# Patient Record
Sex: Male | Born: 1965
Health system: Southern US, Community
[De-identification: ages and names within clinical notes are randomized; demographics above are authoritative.]

## PROBLEM LIST (undated history)

## (undated) DIAGNOSIS — E7800 Pure hypercholesterolemia, unspecified: Secondary | ICD-10-CM

## (undated) DIAGNOSIS — E119 Type 2 diabetes mellitus without complications: Secondary | ICD-10-CM

## (undated) DIAGNOSIS — I10 Essential (primary) hypertension: Secondary | ICD-10-CM

## (undated) DIAGNOSIS — R609 Edema, unspecified: Secondary | ICD-10-CM

## (undated) DIAGNOSIS — N189 Chronic kidney disease, unspecified: Secondary | ICD-10-CM

## (undated) DIAGNOSIS — L97509 Non-pressure chronic ulcer of other part of unspecified foot with unspecified severity: Secondary | ICD-10-CM

## (undated) DIAGNOSIS — G629 Polyneuropathy, unspecified: Secondary | ICD-10-CM

## (undated) DIAGNOSIS — M1990 Unspecified osteoarthritis, unspecified site: Secondary | ICD-10-CM

## (undated) DIAGNOSIS — E11621 Type 2 diabetes mellitus with foot ulcer: Secondary | ICD-10-CM

## (undated) HISTORY — PX: HIP SURGERY: SHX245

---

## 2014-10-23 ENCOUNTER — Emergency Department (HOSPITAL_COMMUNITY)
Admission: EM | Admit: 2014-10-23 | Discharge: 2014-10-23 | Disposition: A | Discharge status: Home / Self Care | Payer: Self-pay | Attending: Emergency Medicine | Admitting: Emergency Medicine

## 2014-10-23 ENCOUNTER — Encounter (HOSPITAL_COMMUNITY): Payer: Self-pay | Admitting: Neurology

## 2014-10-23 DIAGNOSIS — G9009 Other idiopathic peripheral autonomic neuropathy: Secondary | ICD-10-CM | POA: Insufficient documentation

## 2014-10-23 DIAGNOSIS — Z87891 Personal history of nicotine dependence: Secondary | ICD-10-CM | POA: Insufficient documentation

## 2014-10-23 DIAGNOSIS — E1165 Type 2 diabetes mellitus with hyperglycemia: Secondary | ICD-10-CM | POA: Insufficient documentation

## 2014-10-23 DIAGNOSIS — G629 Polyneuropathy, unspecified: Secondary | ICD-10-CM

## 2014-10-23 DIAGNOSIS — R000 Tachycardia, unspecified: Secondary | ICD-10-CM | POA: Insufficient documentation

## 2014-10-23 HISTORY — DX: Polyneuropathy, unspecified: G62.9

## 2014-10-23 HISTORY — DX: Type 2 diabetes mellitus without complications: E11.9

## 2014-10-23 LAB — COMPREHENSIVE METABOLIC PANEL
ALK PHOS: 101 U/L (ref 39–117)
ALT: 26 U/L (ref 0–53)
AST: 20 U/L (ref 0–37)
Albumin: 3.7 g/dL (ref 3.5–5.2)
Anion gap: 10 (ref 5–15)
BUN: 21 mg/dL (ref 6–23)
CO2: 25 mmol/L (ref 19–32)
Calcium: 9.5 mg/dL (ref 8.4–10.5)
Chloride: 96 mmol/L (ref 96–112)
Creatinine, Ser: 0.96 mg/dL (ref 0.50–1.35)
GFR calc Af Amer: >90 mL/min (ref >90)
GFR calc non Af Amer: >90 mL/min (ref >90)
GLUCOSE: 540 mg/dL — AB (ref 70–99)
Potassium: 4.5 mmol/L (ref 3.5–5.1)
Sodium: 131 mmol/L — ABNORMAL LOW (ref 135–145)
Total Bilirubin: 0.8 mg/dL (ref 0.3–1.2)
Total Protein: 6.8 g/dL (ref 6.0–8.3)

## 2014-10-23 LAB — CBC
HEMATOCRIT: 44.2 % (ref 39.0–52.0)
HEMOGLOBIN: 15.6 g/dL (ref 13.0–17.0)
MCH: 28.8 pg (ref 26.0–34.0)
MCHC: 35.3 g/dL (ref 30.0–36.0)
MCV: 81.5 fL (ref 78.0–100.0)
Platelets: 292 10*3/uL (ref 150–400)
RBC: 5.42 MIL/uL (ref 4.22–5.81)
RDW: 12.1 % (ref 11.5–15.5)
WBC: 7.4 10*3/uL (ref 4.0–10.5)

## 2014-10-23 LAB — URINALYSIS, ROUTINE W REFLEX MICROSCOPIC
BILIRUBIN URINE: NEGATIVE
Glucose, UA: >1000 mg/dL — AB
Hgb urine dipstick: NEGATIVE
KETONES UR: NEGATIVE mg/dL
LEUKOCYTES UA: NEGATIVE
Nitrite: NEGATIVE
PROTEIN: NEGATIVE mg/dL
Specific Gravity, Urine: 1.034 — ABNORMAL HIGH (ref 1.005–1.030)
Urobilinogen, UA: 0.2 mg/dL (ref 0.0–1.0)
pH: 5.5 (ref 5.0–8.0)

## 2014-10-23 LAB — URINE MICROSCOPIC-ADD ON

## 2014-10-23 LAB — CBG MONITORING, ED
GLUCOSE-CAPILLARY: 466 mg/dL — AB (ref 70–99)
Glucose-Capillary: 319 mg/dL — ABNORMAL HIGH (ref 70–99)

## 2014-10-23 MED ORDER — GABAPENTIN 600 MG PO TABS
300.0000 mg | ORAL_TABLET | Freq: Once | ORAL | Status: DC
  Filled 2014-10-23: qty 0.5

## 2014-10-23 MED ORDER — GABAPENTIN 300 MG PO CAPS: 300.0000 mg | ORAL_CAPSULE | Freq: Three times a day (TID) | ORAL | Status: DC

## 2014-10-23 MED ORDER — GABAPENTIN 300 MG PO CAPS
300.0000 mg | ORAL_CAPSULE | Freq: Once | ORAL | Status: AC
  Administered 2014-10-23: 300 mg via ORAL
  Filled 2014-10-23: qty 1

## 2014-10-23 MED ORDER — SODIUM CHLORIDE 0.9 % IV BOLUS (SEPSIS)
1000.0000 mL | Freq: Once | INTRAVENOUS | Status: AC
  Administered 2014-10-23: 1000 mL via INTRAVENOUS

## 2014-10-23 NOTE — Care Management (Signed)
ED CM received consult for assistance with f/u establishing primary care and medication. Reviewed patient's record patient is uninsured without a PCP. [place]  [facility] rendered, patient agreeable to [facility] Offered to schedule appointment for f/u to establish primary care., patient verbalized appreciation for the assistance.appointment scheduled for Wed. 3/23 at 10[facility]30[facility]. Patient informed to bring prescriptions received in the ED, he [facility]. Verbalized understanding,teach back done. Discussed dsipositon plan with Dr. Madilyn Hookees. No furrher ED CM needs identified

## 2014-10-23 NOTE — ED Provider Notes (Signed)
CSN: [id number]     Arrival date & time 10/23/14  1245 History   First MD Initiated Contact with Patient 10/23/14 1522     Chief Complaint  Patient Shaw with  . Hyperglycemia  . Peripheral Neuropathy     Patient is a 49 y.o. male presenting with hyperglycemia. The history is provided by the patient. No language interpreter was used.  Hyperglycemia  Ryan Shaw for evaluation of high blood sugar and leg pain. He reports that he was diagnosed in January with diabetes after he was complaining of pain in bilateral legs. He was started on metformin 500 mg twice a day by the emergency department. He just moved to the [place] area does not have a family doctor. He has not been checking his blood sugar recently but today he checked it and it read critical high. He comes in for evaluation because his legs continue to hurt and the hydrocodone he was written for does not seem to change anything and that his sugars run high. He reports polydipsia and polyuria. He denies any reported fevers or cough. He denies any chest pain, abdominal pain, vomiting, diarrhea. Symptoms are moderate, constant, worsening.  Past Medical History  Diagnosis Date  . Diabetes mellitus without complication   . Neuropathy    History reviewed. No pertinent past surgical history. No family history on file. History  Substance Use Topics  . Smoking status: Former Games developermoker  . Smokeless tobacco: Not on file  . Alcohol Use: Yes    Review of Systems  All other systems reviewed and are negative.     Allergies  Review of patient's allergies indicates no known allergies.  Home Medications   Prior to Admission medications   Not on File   BP 148/83 mmHg  Pulse 111  Temp(Src) 98.2 F (36.8 C) (Oral)  Resp 18  SpO2 98% Physical Exam  Constitutional: He is oriented to person, place, and time. He appears well-developed and well-nourished.  HENT:  Head: Normocephalic and atraumatic.  Cardiovascular:  Regular rhythm.   No murmur heard. Tachycardic   Pulmonary/Chest: Effort normal and breath sounds normal. No respiratory distress.  Abdominal: Soft. There is no tenderness. There is no rebound and no guarding.  Musculoskeletal: He exhibits no edema or tenderness.  Neurological: He is alert and oriented to person, place, and time.  Skin: Skin is warm and dry.  Psychiatric: He has a normal mood and affect. His behavior is normal.  Nursing note and vitals reviewed.   ED Course  Procedures (including critical care time) Labs Review Labs Reviewed  COMPREHENSIVE METABOLIC PANEL - Abnormal; Notable for the following:    Sodium 131 (*)    Glucose, Bld 540 (*)    All other components within normal limits  URINALYSIS, ROUTINE W REFLEX MICROSCOPIC - Abnormal; Notable for the following:    Specific Gravity, Urine 1.034 (*)    Glucose, UA >1000 (*)    All other components within normal limits  CBG MONITORING, ED - Abnormal; Notable for the following:    Glucose-Capillary 466 (*)    All other components within normal limits  CBG MONITORING, ED - Abnormal; Notable for the following:    Glucose-Capillary 319 (*)    All other components within normal limits  CBC  URINE MICROSCOPIC-ADD ON  HEMOGLOBIN A1C    Imaging Review No results found.   EKG Interpretation None      MDM   Final diagnoses:  Hyperglycemia due to type 2 diabetes mellitus  Peripheral neuropathy    Patient here for evaluation of hyperglycemia and leg pain. Patient is nontoxic and well-hydrated on exam. Labs are consistent with DKA or major electrolyte abnormality. Discussed with patient home care for hyperglycemia. Will increase metformin to 1500 mg daily answering gabapentin for neuropathy. Discussed with patient should follow-up. He has been seen by case management and has follow-up with [facility]. Discussed home care and return precautions.    Tilden Fossa, MD 10/23/14 [phone]

## 2014-10-23 NOTE — ED Notes (Signed)
Pt reports neuropathy and hyperglycemia, his sugars wouldn't register on his machine. Takes metformin. Also left great toe nail fell off and he wants it checked. Just recently moved to the area and doesn't have MD yet. Pt is a x 4

## 2014-10-23 NOTE — ED Notes (Signed)
Called lab to add on Hgb A1C 

## 2014-10-23 NOTE — ED Notes (Signed)
Pt comfortable with discharge and follow up instructions. Prescriptions x1. PT's daughter taking patient home and pt to [facility] 1030am.  Pt aware and in agreement to go to appointment.

## 2014-10-23 NOTE — ED Notes (Signed)
Pt given urinal and aware of need for sample

## 2014-10-23 NOTE — ED Notes (Signed)
Ryan Shaw at bedside.

## 2014-10-23 NOTE — Discharge Instructions (Signed)
Increase your metformin dose to  in the morning (two tablets) and  in the evening (one tablet) until seen by [facility].     Blood Glucose Monitoring Monitoring your blood glucose (also know as blood sugar) helps you to manage your diabetes. It also helps you and your health care provider monitor your diabetes and determine how well your treatment plan is working. WHY SHOULD YOU MONITOR YOUR BLOOD GLUCOSE?  It can help you understand how food, exercise, and medicine affect your blood glucose.  It allows you to know what your blood glucose is at any given moment. You can quickly tell if you are having low blood glucose (hypoglycemia) or high blood glucose (hyperglycemia).  It can help you and your health care provider know how to adjust your medicines.  It can help you understand how to manage an illness or adjust medicine for exercise. WHEN SHOULD YOU TEST? Your health care provider will help you decide how often you should check your blood glucose. This may depend on the type of diabetes you have, your diabetes control, or the types of medicines you are taking. Be sure to write down all of your blood glucose readings so that this information can be reviewed with your health care provider. See below for examples of testing times that your health care provider may suggest. Type 1 Diabetes  Test 4 times a day if you are in good control, using an insulin pump, or perform multiple daily injections.  If your diabetes is not well controlled or if you are sick, you may need to monitor more often.  It is a good idea to also monitor:  Before and after exercise.  Between meals and 2 hours after a meal.  Occasionally between 2:00 a.m. and 3:00 a.m. Type 2 Diabetes  It can vary with each person, but generally, if you are on insulin, test 4 times a day.  If you take medicines by mouth (orally), test 2 times a day.  If you are on a controlled diet, test once a day.  If  your diabetes is not well controlled or if you are sick, you may need to monitor more often. HOW TO MONITOR YOUR BLOOD GLUCOSE Supplies Needed  Blood glucose meter.  Test strips for your meter. Each meter has its own strips. You must use the strips that go with your own meter.  A pricking needle (lancet).  A device that holds the lancet (lancing device).  A journal or log book to write down your results. Procedure  Wash your hands with soap and water. Alcohol is not preferred.  Prick the side of your finger (not the tip) with the lancet.  Gently milk the finger until a small drop of blood appears.  Follow the instructions that come with your meter for inserting the test strip, applying blood to the strip, and using your blood glucose meter. Other Areas to Get Blood for Testing Some meters allow you to use other areas of your body (other than your finger) to test your blood. These areas are called alternative sites. The most common alternative sites are:  The forearm.  The thigh.  The back area of the lower leg.  The palm of the hand. The blood flow in these areas is slower. Therefore, the blood glucose values you get may be delayed, and the numbers are different from what you would get from your fingers. Do not use alternative sites if you think you are having hypoglycemia. Your  reading will not be accurate. Always use a finger if you are having hypoglycemia. Also, if you cannot feel your lows (hypoglycemia unawareness), always use your fingers for your blood glucose checks. ADDITIONAL TIPS FOR GLUCOSE MONITORING  Do not reuse lancets.  Always carry your supplies with you.  All blood glucose meters have a 24-hour "hotline" number to call if you have questions or need help.  Adjust (calibrate) your blood glucose meter with a control solution after finishing a few boxes of strips. BLOOD GLUCOSE RECORD KEEPING It is a good idea to keep a daily record or log of your blood  glucose readings. Most glucose meters, if not all, keep your glucose records stored in the meter. Some meters come with the ability to download your records to your home computer. Keeping a record of your blood glucose readings is especially helpful if you are wanting to look for patterns. Make notes to go along with the blood glucose readings because you might forget what happened at that exact time. Keeping good records helps you and your health care provider to work together to achieve good diabetes management.  Document Released: 07/23/2003 Document Revised: 12/04/2013 Document Reviewed: 12/12/2012 [facility] Patient Information 2015 [place], [place]. This information is not intended to replace advice given to you by your health care provider. Make sure you discuss any questions you have with your health care provider.  Diabetes Mellitus and Food It is important for you to manage your blood sugar (glucose) level. Your blood glucose level can be greatly affected by what you eat. Eating healthier foods in the appropriate amounts throughout the day at about the same time each day will help you control your blood glucose level. It can also help slow or prevent worsening of your diabetes mellitus. Healthy eating may even help you improve the level of your blood pressure and reach or maintain a healthy weight.  HOW CAN FOOD AFFECT ME? Carbohydrates Carbohydrates affect your blood glucose level more than any other type of food. Your dietitian will help you determine how many carbohydrates to eat at each meal and teach you how to count carbohydrates. Counting carbohydrates is important to keep your blood glucose at a healthy level, especially if you are using insulin or taking certain medicines for diabetes mellitus. Alcohol Alcohol can cause sudden decreases in blood glucose (hypoglycemia), especially if you use insulin or take certain medicines for diabetes mellitus. Hypoglycemia can be a life-threatening  condition. Symptoms of hypoglycemia (sleepiness, dizziness, and disorientation) are similar to symptoms of having too much alcohol.  If your health care provider has given you approval to drink alcohol, do so in moderation and use the following guidelines:  Women should not have more than one drink per day, and men should not have more than two drinks per day. One drink is equal to:  12 oz of beer.  5 oz of wine.  1 oz of hard liquor.  Do not drink on an empty stomach.  Keep yourself hydrated. Have water, diet soda, or unsweetened iced tea.  Regular soda, juice, and other mixers might contain a lot of carbohydrates and should be counted. WHAT FOODS ARE NOT RECOMMENDED? As you make food choices, it is important to remember that all foods are not the same. Some foods have fewer nutrients per serving than other foods, even though they might have the same number of calories or carbohydrates. It is difficult to get your body what it needs when you eat foods with fewer nutrients. Examples  of foods that you should avoid that are high in calories and carbohydrates but low in nutrients include:  Trans fats (most processed foods list trans fats on the Nutrition Facts label).  Regular soda.  Juice.  Candy.  Sweets, such as cake, pie, doughnuts, and cookies.  Fried foods. WHAT FOODS CAN I EAT? Have nutrient-rich foods, which will nourish your body and keep you healthy. The food you should eat also will depend on several factors, including:  The calories you need.  The medicines you take.  Your weight.  Your blood glucose level.  Your blood pressure level.  Your cholesterol level. You also should eat a variety of foods, including:  Protein, such as meat, poultry, fish, tofu, nuts, and seeds (lean animal proteins are best).  Fruits.  Vegetables.  Dairy products, such as milk, cheese, and yogurt (low fat is best).  Breads, grains, pasta, cereal, rice, and beans.  Fats such  as olive oil, trans fat-free margarine, canola oil, avocado, and olives. DOES EVERYONE WITH DIABETES MELLITUS HAVE THE SAME MEAL PLAN? Because every person with diabetes mellitus is different, there is not one meal plan that works for everyone. It is very important that you meet with a dietitian who will help you create a meal plan that is just right for you. Document Released: 04/16/2005 Document Revised: 07/25/2013 Document Reviewed: 06/16/2013 [facility] Patient Information 2015 [place], [place]. This information is not intended to replace advice given to you by your health care provider. Make sure you discuss any questions you have with your health care provider.  Diabetic Nephropathy Diabetic nephropathy is a complication of diabetes that leads to damaged kidneys. It develops slowly. The function of healthy kidneys is to filter and clean blood. Kidneys also get rid of body waste products and extra fluid. When the kidney filters are damaged, there is protein loss in the urine, a decline in kidney function, a buildup of kidney waste products and fluid, and high blood pressure. The damage progresses until the kidneys fail.  RISK FACTORS  High blood pressure (hypertension).  High blood sugar (hyperglycemia).  Family history.  Aging.  Obstruction problems affecting the kidneys, the tubes that drain the kidneys (ureters), or the bladder.  Taking certain drugs or medicines. SYMPTOMS  Symptoms may not be seen or felt for many years. You may not notice any signs of kidney failure until your kidneys have lost much of their ability to function. An early sign of damage is when small amounts of protein (albumin) leak into the urine. However, this can only be found through a urine test. Without physical symptoms, a urine test is often not performed. When the kidneys fail, you may feel one or more of the following:  Swelling of the hands and feet from the extra fluid in your body.  Constant upset  stomach.  Constant fatigue. DIAGNOSIS When someone has diabetes, screening tests are done to look for any early signs of problems before symptoms develop and before damage has already been done. These tests may include:   Annual urine tests to screen for trace amounts of protein in the urine (microalbuminuria).  Urine collectionover 24 hours to measure kidney function.  Blood tests that measure kidney function. Your caregiver is aware that problems other than diabetes can damage kidneys. If screening tests show early kidney damage, but it is thought that a different problem is causing the damage, other tests may be performed. Examples of these tests include:  An ultrasound of your kidney.  Taking  a tissue sample (biopsy) from the kidney. TREATMENT The goal of treatment is to prevent or slow down damage to your kidneys. Controlling hypertension and hyperglycemia is critical. Your goal is to maintain a blood pressure below 120/80. If you have certain other medical problems, this goal may be different. Talk to your caregiver to make sure that your blood pressure goal is right for your needs. Regular testing of your blood glucose at home is important. Your goal is to have a normal blood glucose (110 or less when fasting) as often as possible.  In addition, maintaining your hemoglobin A1c level at less than 7% reduces your risk for complications, including kidney damage. Common treatments include:  Dieting by controlling what you eat as well as the portion sizes.  Exercising to control blood pressure and blood glucose.  Taking medicines.  Giving yourself insulin injections if your caregiver feels that it is necessary.  Getting early treatment for urinary tract infections.  Regularly following up with your caregiver. If your disease progresses to end-stage kidney failure, you will need dialysis or a transplant. Dialysis can be done in 1 of 2 ways:  Hemodialysis. Your blood flows from a  tube in your arm through a machine. The machine filters waste and extra fluid. The clean blood flows back into your arm.  Peritoneal dialysis. Your abdomen is filled with a special fluid. The fluid collects waste products and extra fluid from your blood. The fluid is then drained from your abdomen and discarded. SEEK MEDICAL CARE IF:   You are having problems keeping your blood glucose in the goal range.  You have swelling of the hands or feet.  You have weakness.  You have muscles spasms.  You have a constant upset stomach.  You feel tired all the time and this is not normal for you. SEEK IMMEDIATE MEDICAL CARE IF:  You have unusual dizziness or weakness.  You have excessive sleepiness.  You have a seizure or convulsion.  You have severe, painful muscle spasms.  You have shortness of breath or trouble breathing.  You pass out or have a fainting episode.  You have chest pains. MAKE SURE YOU:  Understand these instructions.  Will watch your condition.  Will get help right away if you are not doing well or get worse. Document Released: 08/09/2007 Document Revised: 03/22/2013 Document Reviewed: 03/11/2011 [facility] Patient Information 2015 [place], [place]. This information is not intended to replace advice given to you by your health care provider. Make sure you discuss any questions you have with your health care provider.

## 2014-10-24 ENCOUNTER — Encounter: Payer: Self-pay | Admitting: Family Medicine

## 2014-10-24 ENCOUNTER — Ambulatory Visit: Payer: Self-pay | Attending: Internal Medicine | Admitting: Family Medicine

## 2014-10-24 VITALS — BP 141/93 | HR 108 | Temp 98.3°F | Resp 16 | Ht 78.0 in | Wt 198.0 lb

## 2014-10-24 DIAGNOSIS — E1342 Other specified diabetes mellitus with diabetic polyneuropathy: Secondary | ICD-10-CM

## 2014-10-24 DIAGNOSIS — G629 Polyneuropathy, unspecified: Secondary | ICD-10-CM

## 2014-10-24 DIAGNOSIS — E1165 Type 2 diabetes mellitus with hyperglycemia: Secondary | ICD-10-CM

## 2014-10-24 DIAGNOSIS — E1142 Type 2 diabetes mellitus with diabetic polyneuropathy: Secondary | ICD-10-CM

## 2014-10-24 LAB — POCT URINALYSIS DIPSTICK
BILIRUBIN UA: NEGATIVE
GLUCOSE UA: 500
Ketones, UA: NEGATIVE
Leukocytes, UA: NEGATIVE
Nitrite, UA: NEGATIVE
Protein, UA: NEGATIVE
RBC UA: NEGATIVE
SPEC GRAV UA: 1.015
UROBILINOGEN UA: 0.2
pH, UA: 5.5

## 2014-10-24 LAB — HEMOGLOBIN A1C
Hgb A1c MFr Bld: 13 % — ABNORMAL HIGH (ref 4.8–5.6)
MEAN PLASMA GLUCOSE: 326 mg/dL

## 2014-10-24 LAB — GLUCOSE, POCT (MANUAL RESULT ENTRY)
POC Glucose: 408 mg/dl — AB (ref 70–99)
POC Glucose: 283 mg/dl — AB (ref 70–99)

## 2014-10-24 MED ORDER — TRUERESULT BLOOD GLUCOSE W/DEVICE KIT: 1.0000 | PACK | Status: DC | PRN

## 2014-10-24 MED ORDER — GLUCOSE BLOOD VI STRP: ORAL_STRIP | Status: DC

## 2014-10-24 MED ORDER — INSULIN ASPART 100 UNIT/ML ~~LOC~~ SOLN
20.0000 [IU] | Freq: Once | SUBCUTANEOUS | Status: AC
  Administered 2014-10-24: 20 [IU] via SUBCUTANEOUS

## 2014-10-24 MED ORDER — TRUEPLUS LANCETS 28G MISC: Status: DC

## 2014-10-24 NOTE — Progress Notes (Signed)
Patient ID: Ryan Shaw, male   DOB: 02/11/1966, 49 y.o.   MRN: [id number] Complaint:  ED follow-up diabetes.   Subjective:  Patient was diagnosed with diabetes in January. He has been on metformin 500 bid and reports taking regularly. He went to ED yesterday with BS over 500.  He was stabalized. His metformin was increased to 500 2 in AM and one in PM.  He was instructed to come here to establish care and receive further treatment for diabetes. His BS this morning was over 400 and is over 400 here.He has a prescription for gabapentin to fill here today.     ROS:  GEN:   Denies fever, chills,                     Admits:  WT loss Skin:   Denies lesions or rashes HENT:   Denies  earache, epistaxis, sore throat, or neck pain, or headaches                LUNGS:  Denies SOB with rest or walking CV:   Denies CP or palpitations ABD:   Denies abdominal pain, nausea,and vomiting GU:   Admits to urinary frequency and increased thirst over last few weeks         EXT:    Denies muscle spasms or swelling; no pain in lower ext, no weakness NEURO:   Denies numbness or tingling, denies seizures  Objective:  Filed Vitals:   10/24/14 1046  BP: 141/93  Pulse: 108  Temp: 98.3 F (36.8 C)  Resp: 16  Height: 6\' 6"  (1.981 m)  Weight: 198 lb (89.812 kg)  SpO2: 99%    Physical Exam:  General:  in no acute distress. HEENT:  no pallor, no icterus, moist oral mucosa, no  lymphadenopathy Heart:   Normal  s1 &s2  Regular rate and rhythm, without M,G,R Lungs:   Clear to auscultation bilaterally. Abdomen:  Soft, nontender, nondistended, positive bowel sounds. Exetremeties:  No pedal edema,pedal pulses normal. Neuro:   Alert, awake, oriented x3, nonfocal.  Pertinent Lab Results:  A1C 13 + yesterday. BS 400+ here today.   Medications: Prior to Admission medications   Medication Sig Start Date End Date Taking? Authorizing Provider  gabapentin (NEURONTIN) 300 MG capsule Take 1 capsule  (300 mg total) by mouth 3 (three) times daily. 10/23/14  Yes Tilden FossaElizabeth Rees, MD  HYDROcodone-acetaminophen (NORCO/VICODIN) 5-325 MG per tablet Take 1 tablet by mouth every 6 (six) hours as needed for moderate pain.   Yes Historical Provider, MD  metFORMIN (GLUCOPHAGE) 500 MG tablet Take 500 mg by mouth 2 (two) times daily with a meal.   Yes Historical Provider, MD    Assessment: 1. Diabetes 2. Peripheral neuropathy .   Plan: 1.  Was given 20 units insulin here with decrease in BS to 280. I have asked him to increase his metformin to 2 500 mg tablets BID. He should make an appoint for Nurse BS check in one week and appointment with PCP for one month.  2.  gabipenten as prescribed.  Do no walk around bare-footed. Inspect feet daily.  Follow up:  The patient was given clear instructions to go to ER or return to medical center if symptoms don't improve, worsen or new problems develop. The patient verbalized understanding.    Henrietta HooverLinda C. Halen Antenucci, FNP,BC 10/24/2014, 11:00 AM

## 2014-10-24 NOTE — Progress Notes (Signed)
Patient here to establish care Has history of DM2 and neuropathy and is c/o bilateral foot and leg pain patient does have Rx for gabapentin he is going to get filled

## 2014-10-24 NOTE — Patient Instructions (Signed)
Increase metformin to 2 500 mg tablets twice a day. Follow-up in one week for a nurse visit to check BS. Follow-up in 2 weeks with primary doctor, who will be assigned.

## 2014-10-25 DIAGNOSIS — E1142 Type 2 diabetes mellitus with diabetic polyneuropathy: Secondary | ICD-10-CM | POA: Insufficient documentation

## 2014-10-26 ENCOUNTER — Telehealth: Payer: Self-pay | Admitting: Family Medicine

## 2014-10-26 ENCOUNTER — Emergency Department (INDEPENDENT_AMBULATORY_CARE_PROVIDER_SITE_OTHER)
Admission: EM | Admit: 2014-10-26 | Discharge: 2014-10-26 | Disposition: A | Discharge status: Home / Self Care | Payer: Self-pay | Source: Home / Self Care | Attending: Family Medicine | Admitting: Family Medicine

## 2014-10-26 ENCOUNTER — Encounter (HOSPITAL_COMMUNITY): Payer: Self-pay | Admitting: *Deleted

## 2014-10-26 DIAGNOSIS — E1140 Type 2 diabetes mellitus with diabetic neuropathy, unspecified: Secondary | ICD-10-CM

## 2014-10-26 NOTE — ED Provider Notes (Signed)
CSN: [id number]     Arrival date & time 10/26/14  1725 History   First MD Initiated Contact with Patient 10/26/14 1823     Chief Complaint  Patient presents with  . Numbness   (Consider location/radiation/quality/duration/timing/severity/associated sxs/prior Treatment) HPI Comments: 49 year old male only recently diagnosed with type 2 diabetes mellitus and uncontrolled has developed peripheral neuropathy several months ago. He was seen in emergency department on 10/24/2014 with a blood sugar in the 400s. His peripheral neuropathy was treated with gabapentin. He presents tonight with complaints of the same neuropathic pain. He states his blood sugar was 330 this morning. He has no other complaints.   Past Medical History  Diagnosis Date  . Diabetes mellitus without complication   . Neuropathy    History reviewed. No pertinent past surgical history. Family History  Problem Relation Age of Onset  . Cancer Mother   . Aneurysm Father     brain   History  Substance Use Topics  . Smoking status: Current Some Day Smoker    Types: Cigarettes    Last Attempt to Quit: 10/02/2014  . Smokeless tobacco: Not on file     Comment: uses a vapor -puff-4-5 x/day  . Alcohol Use: Yes     Comment: occasional    Review of Systems  Constitutional: Positive for activity change. Negative for fatigue.  HENT: Negative.   Respiratory: Negative.   Cardiovascular: Negative.   Gastrointestinal: Negative.   Neurological:       Peripheral neuropathic pain as in history of present illness    Allergies  Review of patient's allergies indicates no known allergies.  Home Medications   Prior to Admission medications   Medication Sig Start Date End Date Taking? Authorizing Provider  Blood Glucose Monitoring Suppl (TRUERESULT BLOOD GLUCOSE) W/DEVICE KIT 1 each by Does not apply route as needed. 10/24/14  Yes Micheline Chapman, NP  gabapentin (NEURONTIN) 300 MG capsule Take 1 capsule (300 mg total) by mouth 3  (three) times daily. 10/23/14  Yes Quintella Reichert, MD  glucose blood (EQL TRUETEST TEST) test strip Use as instructed 10/24/14  Yes Micheline Chapman, NP  metFORMIN (GLUCOPHAGE) 500 MG tablet Take 500 mg by mouth 2 (two) times daily with a meal.   Yes Historical Provider, MD  TRUEPLUS LANCETS 28G MISC Check BS 4 times daily 10/24/14  Yes Micheline Chapman, NP  HYDROcodone-acetaminophen (NORCO/VICODIN) 5-325 MG per tablet Take 1 tablet by mouth every 6 (six) hours as needed for moderate pain.    Historical Provider, MD   BP 141/94 mmHg  Pulse 104  Temp(Src) 97.4 F (36.3 C) (Oral)  Resp 18  SpO2 96% Physical Exam  Constitutional: He is oriented to person, place, and time. He appears well-developed and well-nourished. No distress.  Neck: Normal range of motion. Neck supple.  Cardiovascular: Normal rate.   Pulmonary/Chest: Effort normal.  Musculoskeletal: Normal range of motion. He exhibits no edema.  Lower extremities without edema. Positive for superficial phlebitis. No open skin areas. Pedal pulses are one half to one plus. No discoloration.  Neurological: He is alert and oriented to person, place, and time.  Skin: Skin is warm and dry.  Psychiatric: He has a normal mood and affect.  Nursing note and vitals reviewed.   ED Course  Procedures (including critical care time) Labs Review Labs Reviewed - No data to display  Imaging Review No results found.   MDM   1. Type 2 diabetes mellitus with diabetic neuropathy    Increase  gabapentin from 1 capsule 3 times a day 2 when The Morning, One in Early Afternoon and to at 8:00 PM. Keep Your Blood Sugars Is Normal As Possible. Keep your appointment with PCP next week. He will need additional adjustments in your medication management.    Janne Napoleon, NP 10/26/14 [phone]

## 2014-10-26 NOTE — ED Notes (Signed)
C/o feeling like someone is sticking him with needles in his legs.  Sx. onset yesterday @ 12 N.  He started Gabapentin on 3/23.  Had 3 doses, 2 yesterday and one this AM before symptoms started.

## 2014-10-26 NOTE — Discharge Instructions (Signed)
Blood Glucose Monitoring °Monitoring your blood glucose (also know as blood sugar) helps you to manage your diabetes. It also helps you and your health care provider monitor your diabetes and determine how well your treatment plan is working. °WHY SHOULD YOU MONITOR YOUR BLOOD GLUCOSE? °· It can help you understand how food, exercise, and medicine affect your blood glucose. °· It allows you to know what your blood glucose is at any given moment. You can quickly tell if you are having low blood glucose (hypoglycemia) or high blood glucose (hyperglycemia). °· It can help you and your health care provider know how to adjust your medicines. °· It can help you understand how to manage an illness or adjust medicine for exercise. °WHEN SHOULD YOU TEST? °Your health care provider will help you decide how often you should check your blood glucose. This may depend on the type of diabetes you have, your diabetes control, or the types of medicines you are taking. Be sure to write down all of your blood glucose readings so that this information can be reviewed with your health care provider. See below for examples of testing times that your health care provider may suggest. °Type 1 Diabetes °· Test 4 times a day if you are in good control, using an insulin pump, or perform multiple daily injections. °· If your diabetes is not well controlled or if you are sick, you may need to monitor more often. °· It is a good idea to also monitor: °· Before and after exercise. °· Between meals and 2 hours after a meal. °· Occasionally between 2:00 a.m. and 3:00 a.m. °Type 2 Diabetes °· It can vary with each person, but generally, if you are on insulin, test 4 times a day. °· If you take medicines by mouth (orally), test 2 times a day. °· If you are on a controlled diet, test once a day. °· If your diabetes is not well controlled or if you are sick, you may need to monitor more often. °HOW TO MONITOR YOUR BLOOD GLUCOSE °Supplies  Needed °· Blood glucose meter. °· Test strips for your meter. Each meter has its own strips. You must use the strips that go with your own meter. °· A pricking needle (lancet). °· A device that holds the lancet (lancing device). °· A journal or log book to write down your results. °Procedure °· Wash your hands with soap and water. Alcohol is not preferred. °· Prick the side of your finger (not the tip) with the lancet. °· Gently milk the finger until a small drop of blood appears. °· Follow the instructions that come with your meter for inserting the test strip, applying blood to the strip, and using your blood glucose meter. °Other Areas to Get Blood for Testing °Some meters allow you to use other areas of your body (other than your finger) to test your blood. These areas are called alternative sites. The most common alternative sites are: °· The forearm. °· The thigh. °· The back area of the lower leg. °· The palm of the hand. °The blood flow in these areas is slower. Therefore, the blood glucose values you get may be delayed, and the numbers are different from what you would get from your fingers. Do not use alternative sites if you think you are having hypoglycemia. Your reading will not be accurate. Always use a finger if you are having hypoglycemia. Also, if you cannot feel your lows (hypoglycemia unawareness), always use your fingers for your   blood glucose checks. ADDITIONAL TIPS FOR GLUCOSE MONITORING  Do not reuse lancets.  Always carry your supplies with you.  All blood glucose meters have a 24-hour "hotline" number to call if you have questions or need help.  Adjust (calibrate) your blood glucose meter with a control solution after finishing a few boxes of strips. BLOOD GLUCOSE RECORD KEEPING It is a good idea to keep a daily record or log of your blood glucose readings. Most glucose meters, if not all, keep your glucose records stored in the meter. Some meters come with the ability to download  your records to your home computer. Keeping a record of your blood glucose readings is especially helpful if you are wanting to look for patterns. Make notes to go along with the blood glucose readings because you might forget what happened at that exact time. Keeping good records helps you and your health care provider to work together to achieve good diabetes management.  Document Released: 07/23/2003 Document Revised: 12/04/2013 Document Reviewed: 12/12/2012 [facility] Patient Information 2015 [place], [place]. This information is not intended to replace advice given to you by your health care provider. Make sure you discuss any questions you have with your health care provider.  Neuropathic Pain We often think that pain has a physical cause. If we get rid of the cause, the pain should go away. Nerves themselves can also cause pain. It is called neuropathic pain, which means nerve abnormality. It may be difficult for the patients who have it and for the treating caregivers. Pain is usually described as acute (short-lived) or chronic (long-lasting). Acute pain is related to the physical sensations caused by an injury. It can last from a few seconds to many weeks, but it usually goes away when normal healing occurs. Chronic pain lasts beyond the typical healing time. With neuropathic pain, the nerve fibers themselves may be damaged or injured. They then send incorrect signals to other pain centers. The pain you feel is real, but the cause is not easy to find.  CAUSES  Chronic pain can result from diseases, such as diabetes and shingles (an infection related to chickenpox), or from trauma, surgery, or amputation. It can also happen without any known injury or disease. The nerves are sending pain messages, even though there is no identifiable cause for such messages.   Other common causes of neuropathy include diabetes, phantom limb pain, or Regional Pain Syndrome (RPS).  As with all forms of chronic back  pain, if neuropathy is not correctly treated, there can be a number of associated problems that lead to a downward cycle for the patient. These include depression, sleeplessness, feelings of fear and anxiety, limited social interaction and inability to do normal daily activities or work.  The most dramatic and mysterious example of neuropathic pain is called "phantom limb syndrome." This occurs when an arm or a leg has been removed because of illness or injury. The brain still gets pain messages from the nerves that originally carried impulses from the missing limb. These nerves now seem to misfire and cause troubling pain.  Neuropathic pain often seems to have no cause. It responds poorly to standard pain treatment. Neuropathic pain can occur after:  Shingles (herpes zoster virus infection).  A lasting burning sensation of the skin, caused usually by injury to a peripheral nerve.  Peripheral neuropathy which is widespread nerve damage, often caused by diabetes or alcoholism.  Phantom limb pain following an amputation.  Facial nerve problems (trigeminal neuralgia).  Multiple sclerosis.  Reflex sympathetic dystrophy.  Pain which comes with cancer and cancer chemotherapy.  Entrapment neuropathy such as when pressure is put on a nerve such as in carpal tunnel syndrome.  Back, leg, and hip problems (sciatica).  Spine or back surgery.  HIV Infection or AIDS where nerves are infected by viruses. Your caregiver can explain items in the above list which may apply to you. SYMPTOMS  Characteristics of neuropathic pain are:  Severe, sharp, electric shock-like, shooting, lightening-like, knife-like.  Pins and needles sensation.  Deep burning, deep cold, or deep ache.  Persistent numbness, tingling, or weakness.  Pain resulting from light touch or other stimulus that would not usually cause pain.  Increased sensitivity to something that would normally cause pain, such as a  pinprick. Pain may persist for months or years following the healing of damaged tissues. When this happens, pain signals no longer sound an alarm about current injuries or injuries about to happen. Instead, the alarm system itself is not working correctly.  Neuropathic pain may get worse instead of better over time. For some people, it can lead to serious disability. It is important to be aware that severe injury in a limb can occur without a proper, protective pain response.Burns, cuts, and other injuries may go unnoticed. Without proper treatment, these injuries can become infected or lead to further disability. Take any injury seriously, and consult your caregiver for treatment. DIAGNOSIS  When you have a pain with no known cause, your caregiver will probably ask some specific questions:   Do you have any other conditions, such as diabetes, shingles, multiple sclerosis, or HIV infection?  How would you describe your pain? (Neuropathic pain is often described as shooting, stabbing, burning, or searing.)  Is your pain worse at any time of the day? (Neuropathic pain is usually worse at night.)  Does the pain seem to follow a certain physical pathway?  Does the pain come from an area that has missing or injured nerves? (An example would be phantom limb pain.)  Is the pain triggered by minor things such as rubbing against the sheets at night? These questions often help define the type of pain involved. Once your caregiver knows what is happening, treatment can begin. Anticonvulsant, antidepressant drugs, and various pain relievers seem to work in some cases. If another condition, such as diabetes is involved, better management of that disorder may relieve the neuropathic pain.  TREATMENT  Neuropathic pain is frequently long-lasting and tends not to respond to treatment with narcotic type pain medication. It may respond well to other drugs such as antiseizure and antidepressant medications. Usually,  neuropathic problems do not completely go away, but partial improvement is often possible with proper treatment. Your caregivers have large numbers of medications available to treat you. Do not be discouraged if you do not get immediate relief. Sometimes different medications or a combination of medications will be tried before you receive the results you are hoping for. See your caregiver if you have pain that seems to be coming from nowhere and does not go away. Help is available.  SEEK IMMEDIATE MEDICAL CARE IF:   There is a sudden change in the quality of your pain, especially if the change is on only one side of the body.  You notice changes of the skin, such as redness, black or purple discoloration, swelling, or an ulcer.  You cannot move the affected limbs. Document Released: 04/16/2004 Document Revised: 10/12/2011 Document Reviewed: 04/16/2004 [facility] Patient Information 2015 [place], [place]. This information  is not intended to replace advice given to you by your health care provider. Make sure you discuss any questions you have with your health care provider.

## 2014-10-26 NOTE — ED Notes (Signed)
NP wrote in instruction sheet to increase Gabapentin by 1 pill each day - to  1 in AM, 1 in afternoon and 2 at 2000.  Pt. voiced understanding.

## 2014-10-26 NOTE — Telephone Encounter (Signed)
Pt called requesting to speak to nurse regarding medication side affects , pt states he feels the back of legs on fire.Please f/u

## 2014-10-30 ENCOUNTER — Encounter (HOSPITAL_COMMUNITY): Payer: Self-pay | Admitting: *Deleted

## 2014-10-30 ENCOUNTER — Emergency Department (HOSPITAL_COMMUNITY)
Admission: EM | Admit: 2014-10-30 | Discharge: 2014-10-30 | Disposition: A | Discharge status: Home / Self Care | Payer: Self-pay | Attending: Emergency Medicine | Admitting: Emergency Medicine

## 2014-10-30 DIAGNOSIS — Z79899 Other long term (current) drug therapy: Secondary | ICD-10-CM | POA: Insufficient documentation

## 2014-10-30 DIAGNOSIS — M792 Neuralgia and neuritis, unspecified: Secondary | ICD-10-CM

## 2014-10-30 DIAGNOSIS — E1142 Type 2 diabetes mellitus with diabetic polyneuropathy: Secondary | ICD-10-CM | POA: Insufficient documentation

## 2014-10-30 DIAGNOSIS — Z794 Long term (current) use of insulin: Secondary | ICD-10-CM | POA: Insufficient documentation

## 2014-10-30 DIAGNOSIS — Z720 Tobacco use: Secondary | ICD-10-CM | POA: Insufficient documentation

## 2014-10-30 MED ORDER — IBUPROFEN 600 MG PO TABS: 600.0000 mg | ORAL_TABLET | Freq: Four times a day (QID) | ORAL | Status: DC | PRN

## 2014-10-30 MED ORDER — OXYCODONE-ACETAMINOPHEN 5-325 MG PO TABS: 1.0000 | ORAL_TABLET | ORAL | Status: DC | PRN

## 2014-10-30 MED ORDER — OXYCODONE-ACETAMINOPHEN 5-325 MG PO TABS
2.0000 | ORAL_TABLET | Freq: Once | ORAL | Status: AC
  Administered 2014-10-30: 2 via ORAL
  Filled 2014-10-30: qty 2

## 2014-10-30 MED ORDER — IBUPROFEN 400 MG PO TABS
600.0000 mg | ORAL_TABLET | Freq: Once | ORAL | Status: AC
  Administered 2014-10-30: 600 mg via ORAL
  Filled 2014-10-30 (×2): qty 1

## 2014-10-30 NOTE — ED Notes (Signed)
Pt in via EMS, c/o return of tingling and pain to bilateral lower legs, history of neuropathy and is taking gabapentin for that, states he thought he was having a reaction to his gabapentin due to these symptoms and stopped taking it, no distress noted, no swelling noted to legs

## 2014-10-30 NOTE — ED Provider Notes (Signed)
CSN: [id number]     Arrival date & time 10/30/14  0358 History   First MD Initiated Contact with Patient 10/30/14 0431     Chief Complaint  Patient presents with  . Neuropathy      (Consider location/radiation/quality/duration/timing/severity/associated sxs/prior Treatment) HPI Comments: Pt comes in with cc of tingling and burning type pain to his legs. Pt has diabetic neuropathy. He reports that his symptoms are worse now than usual. Pain is located in bilateral legs, and is similar to his neuropathy pain, just severe. Pt not taking gabapentin, states that the clinic doctors told him stop. He sees a physician again on Thursday - but he is not sure what their specialty is. No fevers, chills.  The history is provided by the patient.    Past Medical History  Diagnosis Date  . Diabetes mellitus without complication   . Neuropathy    History reviewed. No pertinent past surgical history. Family History  Problem Relation Age of Onset  . Cancer Mother   . Aneurysm Father     brain   History  Substance Use Topics  . Smoking status: Current Some Day Smoker    Types: Cigarettes    Last Attempt to Quit: 10/02/2014  . Smokeless tobacco: Not on file     Comment: uses a vapor -puff-4-5 x/day  . Alcohol Use: Yes     Comment: occasional    Review of Systems  Skin: Negative for rash and wound.  Neurological: Positive for numbness.      Allergies  Review of patient's allergies indicates no known allergies.  Home Medications   Prior to Admission medications   Medication Sig Start Date End Date Taking? Authorizing Provider  gabapentin (NEURONTIN) 300 MG capsule Take 1 capsule (300 mg total) by mouth 3 (three) times daily. 10/23/14  Yes Quintella Reichert, MD  metFORMIN (GLUCOPHAGE) 500 MG tablet Take 500 mg by mouth 2 (two) times daily with a meal.   Yes Historical Provider, MD  Blood Glucose Monitoring Suppl (TRUERESULT BLOOD GLUCOSE) W/DEVICE KIT 1 each by Does not apply route as  needed. 10/24/14   Micheline Chapman, NP  glucose blood (EQL TRUETEST TEST) test strip Use as instructed 10/24/14   Micheline Chapman, NP  ibuprofen (ADVIL,MOTRIN) 600 MG tablet Take 1 tablet (600 mg total) by mouth every 6 (six) hours as needed. 10/30/14   Varney Biles, MD  oxyCODONE-acetaminophen (PERCOCET/ROXICET) 5-325 MG per tablet Take 1 tablet by mouth every 4 (four) hours as needed for severe pain. 10/30/14   Varney Biles, MD  TRUEPLUS LANCETS 28G MISC Check BS 4 times daily 10/24/14   Micheline Chapman, NP   BP 104/64 mmHg  Pulse 103  Temp(Src) 98.3 F (36.8 C) (Oral)  Resp 16  SpO2 97% Physical Exam  Constitutional: He is oriented to person, place, and time. He appears well-developed.  HENT:  Head: Normocephalic and atraumatic.  Eyes: Conjunctivae and EOM are normal. Pupils are equal, round, and reactive to light.  Neck: Normal range of motion. Neck supple.  Cardiovascular: Normal rate and regular rhythm.   Pulmonary/Chest: Effort normal and breath sounds normal.  Abdominal: Soft. Bowel sounds are normal. He exhibits no distension. There is no tenderness. There is no rebound and no guarding.  Musculoskeletal: He exhibits no edema.  Tenderness to palpation of both legs and feet  Neurological: He is alert and oriented to person, place, and time.  Skin: Skin is warm. No rash noted. No erythema.  Nursing note and vitals  reviewed.   ED Course  Procedures (including critical care time) Labs Review Labs Reviewed - No data to display  Imaging Review No results found.   EKG Interpretation None      MDM   Final diagnoses:  Peripheral neuropathic pain    Pt comes in with cc of leg pain. No signs of infection, ulcers, DVT. Will give oral pain meds - and advise continued care with pcp. Will give neuro information, in case his pain is not responding to oral meds.   Varney Biles, MD 10/30/14 [phone]

## 2014-10-30 NOTE — Discharge Instructions (Signed)
Please see the Neurologist as requested. Appears to be neuropathic pain.   Peripheral Neuropathy Peripheral neuropathy is a type of nerve damage. It affects nerves that carry signals between the spinal cord and other parts of the body. These are called peripheral nerves. With peripheral neuropathy, one nerve or a group of nerves may be damaged.  CAUSES  Many things can damage peripheral nerves. For some people with peripheral neuropathy, the cause is unknown. Some causes include:  Diabetes. This is the most common cause of peripheral neuropathy.  Injury to a nerve.  Pressure or stress on a nerve that lasts a long time.  Too little vitamin B. Alcoholism can lead to this.  Infections.  Autoimmune diseases, such as multiple sclerosis and systemic lupus erythematosus.  Inherited nerve diseases.  Some medicines, such as cancer drugs.  Toxic substances, such as lead and mercury.  Too little blood flowing to the legs.  Kidney disease.  Thyroid disease. SIGNS AND SYMPTOMS  Different people have different symptoms. The symptoms you have will depend on which of your nerves is damaged. Common symptoms include:  Loss of feeling (numbness) in the feet and hands.  Tingling in the feet and hands.  Pain that burns.  Very sensitive skin.  Weakness.  Not being able to move a part of the body (paralysis).  Muscle twitching.  Clumsiness or poor coordination.  Loss of balance.  Not being able to control your bladder.  Feeling dizzy.  Sexual problems. DIAGNOSIS  Peripheral neuropathy is a symptom, not a disease. Finding the cause of peripheral neuropathy can be hard. To figure that out, your health care provider will take a medical history and do a physical exam. A neurological exam will also be done. This involves checking things affected by your brain, spinal cord, and nerves (nervous system). For example, your health care provider will check your reflexes, how you move, and  what you can feel.  Other types of tests may also be ordered, such as:  Blood tests.  A test of the fluid in your spinal cord.  Imaging tests, such as CT scans or an MRI.  Electromyography (EMG). This test checks the nerves that control muscles.  Nerve conduction velocity tests. These tests check how fast messages pass through your nerves.  Nerve biopsy. A small piece of nerve is removed. It is then checked under a microscope. TREATMENT   Medicine is often used to treat peripheral neuropathy. Medicines may include:  Pain-relieving medicines. Prescription or over-the-counter medicine may be suggested.  Antiseizure medicine. This may be used for pain.  Antidepressants. These also may help ease pain from neuropathy.  Lidocaine. This is a numbing medicine. You might wear a patch or be given a shot.  Mexiletine. This medicine is typically used to help control irregular heart rhythms.  Surgery. Surgery may be needed to relieve pressure on a nerve or to destroy a nerve that is causing pain.  Physical therapy to help movement.  Assistive devices to help movement. HOME CARE INSTRUCTIONS   Only take over-the-counter or prescription medicines as directed by your health care provider. Follow the instructions carefully for any given medicines. Do not take any other medicines without first getting approval from your health care provider.  If you have diabetes, work closely with your health care provider to keep your blood sugar under control.  If you have numbness in your feet:  Check every day for signs of injury or infection. Watch for redness, warmth, and swelling.  Wear padded  socks and comfortable shoes. These help protect your feet.  Do not do things that put pressure on your damaged nerve.  Do not smoke. Smoking keeps blood from getting to damaged nerves.  Avoid or limit alcohol. Too much alcohol can cause a lack of B vitamins. These vitamins are needed for healthy  nerves.  Develop a good support system. Coping with peripheral neuropathy can be stressful. Talk to a mental health specialist or join a support group if you are struggling.  Follow up with your health care provider as directed. SEEK MEDICAL CARE IF:   You have new signs or symptoms of peripheral neuropathy.  You are struggling emotionally from dealing with peripheral neuropathy.  You have a fever. SEEK IMMEDIATE MEDICAL CARE IF:   You have an injury or infection that is not healing.  You feel very dizzy or begin vomiting.  You have chest pain.  You have trouble breathing. Document Released: 07/10/2002 Document Revised: 04/01/2011 Document Reviewed: 03/27/2013 [facility] Patient Information 2015 [place], [place]. This information is not intended to replace advice given to you by your health care provider. Make sure you discuss any questions you have with your health care provider.  Neuropathic Pain We often think that pain has a physical cause. If we get rid of the cause, the pain should go away. Nerves themselves can also cause pain. It is called neuropathic pain, which means nerve abnormality. It may be difficult for the patients who have it and for the treating caregivers. Pain is usually described as acute (short-lived) or chronic (long-lasting). Acute pain is related to the physical sensations caused by an injury. It can last from a few seconds to many weeks, but it usually goes away when normal healing occurs. Chronic pain lasts beyond the typical healing time. With neuropathic pain, the nerve fibers themselves may be damaged or injured. They then send incorrect signals to other pain centers. The pain you feel is real, but the cause is not easy to find.  CAUSES  Chronic pain can result from diseases, such as diabetes and shingles (an infection related to chickenpox), or from trauma, surgery, or amputation. It can also happen without any known injury or disease. The nerves are sending  pain messages, even though there is no identifiable cause for such messages.   Other common causes of neuropathy include diabetes, phantom limb pain, or Regional Pain Syndrome (RPS).  As with all forms of chronic back pain, if neuropathy is not correctly treated, there can be a number of associated problems that lead to a downward cycle for the patient. These include depression, sleeplessness, feelings of fear and anxiety, limited social interaction and inability to do normal daily activities or work.  The most dramatic and mysterious example of neuropathic pain is called "phantom limb syndrome." This occurs when an arm or a leg has been removed because of illness or injury. The brain still gets pain messages from the nerves that originally carried impulses from the missing limb. These nerves now seem to misfire and cause troubling pain.  Neuropathic pain often seems to have no cause. It responds poorly to standard pain treatment. Neuropathic pain can occur after:  Shingles (herpes zoster virus infection).  A lasting burning sensation of the skin, caused usually by injury to a peripheral nerve.  Peripheral neuropathy which is widespread nerve damage, often caused by diabetes or alcoholism.  Phantom limb pain following an amputation.  Facial nerve problems (trigeminal neuralgia).  Multiple sclerosis.  Reflex sympathetic dystrophy.  Pain  which comes with cancer and cancer chemotherapy.  Entrapment neuropathy such as when pressure is put on a nerve such as in carpal tunnel syndrome.  Back, leg, and hip problems (sciatica).  Spine or back surgery.  HIV Infection or AIDS where nerves are infected by viruses. Your caregiver can explain items in the above list which may apply to you. SYMPTOMS  Characteristics of neuropathic pain are:  Severe, sharp, electric shock-like, shooting, lightening-like, knife-like.  Pins and needles sensation.  Deep burning, deep cold, or deep  ache.  Persistent numbness, tingling, or weakness.  Pain resulting from light touch or other stimulus that would not usually cause pain.  Increased sensitivity to something that would normally cause pain, such as a pinprick. Pain may persist for months or years following the healing of damaged tissues. When this happens, pain signals no longer sound an alarm about current injuries or injuries about to happen. Instead, the alarm system itself is not working correctly.  Neuropathic pain may get worse instead of better over time. For some people, it can lead to serious disability. It is important to be aware that severe injury in a limb can occur without a proper, protective pain response.Burns, cuts, and other injuries may go unnoticed. Without proper treatment, these injuries can become infected or lead to further disability. Take any injury seriously, and consult your caregiver for treatment. DIAGNOSIS  When you have a pain with no known cause, your caregiver will probably ask some specific questions:   Do you have any other conditions, such as diabetes, shingles, multiple sclerosis, or HIV infection?  How would you describe your pain? (Neuropathic pain is often described as shooting, stabbing, burning, or searing.)  Is your pain worse at any time of the day? (Neuropathic pain is usually worse at night.)  Does the pain seem to follow a certain physical pathway?  Does the pain come from an area that has missing or injured nerves? (An example would be phantom limb pain.)  Is the pain triggered by minor things such as rubbing against the sheets at night? These questions often help define the type of pain involved. Once your caregiver knows what is happening, treatment can begin. Anticonvulsant, antidepressant drugs, and various pain relievers seem to work in some cases. If another condition, such as diabetes is involved, better management of that disorder may relieve the neuropathic pain.   TREATMENT  Neuropathic pain is frequently long-lasting and tends not to respond to treatment with narcotic type pain medication. It may respond well to other drugs such as antiseizure and antidepressant medications. Usually, neuropathic problems do not completely go away, but partial improvement is often possible with proper treatment. Your caregivers have large numbers of medications available to treat you. Do not be discouraged if you do not get immediate relief. Sometimes different medications or a combination of medications will be tried before you receive the results you are hoping for. See your caregiver if you have pain that seems to be coming from nowhere and does not go away. Help is available.  SEEK IMMEDIATE MEDICAL CARE IF:   There is a sudden change in the quality of your pain, especially if the change is on only one side of the body.  You notice changes of the skin, such as redness, black or purple discoloration, swelling, or an ulcer.  You cannot move the affected limbs. Document Released: 04/16/2004 Document Revised: 10/12/2011 Document Reviewed: 04/16/2004 [facility] Patient Information 2015 [place], [place]. This information is not intended to replace  advice given to you by your health care provider. Make sure you discuss any questions you have with your health care provider. ° °

## 2014-10-30 NOTE — ED Notes (Signed)
Gave pt ice water, per Denny PeonErin - RN

## 2014-11-01 ENCOUNTER — Encounter: Payer: Self-pay | Admitting: Internal Medicine

## 2014-11-01 ENCOUNTER — Ambulatory Visit: Payer: Self-pay | Attending: Internal Medicine | Admitting: Internal Medicine

## 2014-11-01 VITALS — BP 136/82 | HR 62 | Temp 98.8°F | Resp 16 | Wt 203.2 lb

## 2014-11-01 DIAGNOSIS — E1142 Type 2 diabetes mellitus with diabetic polyneuropathy: Secondary | ICD-10-CM

## 2014-11-01 DIAGNOSIS — E1100 Type 2 diabetes mellitus with hyperosmolarity without nonketotic hyperglycemic-hyperosmolar coma (NKHHC): Secondary | ICD-10-CM | POA: Insufficient documentation

## 2014-11-01 DIAGNOSIS — E1342 Other specified diabetes mellitus with diabetic polyneuropathy: Secondary | ICD-10-CM

## 2014-11-01 DIAGNOSIS — G629 Polyneuropathy, unspecified: Secondary | ICD-10-CM

## 2014-11-01 DIAGNOSIS — F17210 Nicotine dependence, cigarettes, uncomplicated: Secondary | ICD-10-CM | POA: Insufficient documentation

## 2014-11-01 DIAGNOSIS — E1140 Type 2 diabetes mellitus with diabetic neuropathy, unspecified: Secondary | ICD-10-CM | POA: Insufficient documentation

## 2014-11-01 DIAGNOSIS — IMO0002 Reserved for concepts with insufficient information to code with codable children: Secondary | ICD-10-CM

## 2014-11-01 DIAGNOSIS — E1165 Type 2 diabetes mellitus with hyperglycemia: Secondary | ICD-10-CM | POA: Insufficient documentation

## 2014-11-01 LAB — GLUCOSE, POCT (MANUAL RESULT ENTRY): POC GLUCOSE: 348 mg/dL — AB (ref 70–99)

## 2014-11-01 MED ORDER — INSULIN GLARGINE 100 UNIT/ML SOLOSTAR PEN: 15.0000 [IU] | PEN_INJECTOR | Freq: Every day | SUBCUTANEOUS | Status: DC

## 2014-11-01 MED ORDER — METFORMIN HCL 1000 MG PO TABS: 1000.0000 mg | ORAL_TABLET | Freq: Two times a day (BID) | ORAL | Status: DC

## 2014-11-01 MED ORDER — GABAPENTIN 300 MG PO CAPS: 600.0000 mg | ORAL_CAPSULE | Freq: Three times a day (TID) | ORAL | Status: DC

## 2014-11-01 MED ORDER — INSULIN ASPART 100 UNIT/ML ~~LOC~~ SOLN
10.0000 [IU] | Freq: Once | SUBCUTANEOUS | Status: AC
  Administered 2014-11-01: 10 [IU] via SUBCUTANEOUS

## 2014-11-01 NOTE — Progress Notes (Signed)
Patient here for follow up Was recently in the ED for pain in his lower legs but States the gabapentin is not helping The ED prescribed percocet but patient states he is not taking that Patient also complains of nail to left great toe is about to fall off Presents in office with elevated blood sugar of 348 so 10units of novolog given per Office protocol

## 2014-11-01 NOTE — Progress Notes (Signed)
Patient ID: Ryan Shaw, male   DOB: 1965/12/18, 49 y.o.   MRN: [id number]  CC:  HPI: Ryan Shaw is a 49 y.o. male here today for a follow up visit.  Patient has past medical history of T2DM and diabetic neuropathy.  Patient was recently diagnosed with diabetes 2 months ago and was started on Metformin.  Now patient is currently on Metformin 1,000 mg BID. He has been checking is blood sugar twice per day with ranges of 307-491.  Today his cbg is 348.  In the past one week patient has been seen here in the walk in clinic, the urgent care, and ER for BLE peripheral neuropathy. He has since had his gabapentin increased to 600 mg BID.    No Known Allergies Past Medical History  Diagnosis Date  . Diabetes mellitus without complication   . Neuropathy    Current Outpatient Prescriptions on File Prior to Visit  Medication Sig Dispense Refill  . Blood Glucose Monitoring Suppl (TRUERESULT BLOOD GLUCOSE) W/DEVICE KIT 1 each by Does not apply route as needed. 1 each 0  . gabapentin (NEURONTIN) 300 MG capsule Take 1 capsule (300 mg total) by mouth 3 (three) times daily. 60 capsule 0  . glucose blood (EQL TRUETEST TEST) test strip Use as instructed 100 each 12  . ibuprofen (ADVIL,MOTRIN) 600 MG tablet Take 1 tablet (600 mg total) by mouth every 6 (six) hours as needed. 30 tablet 0  . metFORMIN (GLUCOPHAGE) 500 MG tablet Take 500 mg by mouth 2 (two) times daily with a meal.    . oxyCODONE-acetaminophen (PERCOCET/ROXICET) 5-325 MG per tablet Take 1 tablet by mouth every 4 (four) hours as needed for severe pain. 10 tablet 0  . TRUEPLUS LANCETS 28G MISC Check BS 4 times daily 1 each 0   No current facility-administered medications on file prior to visit.   Family History  Problem Relation Age of Onset  . Cancer Mother   . Aneurysm Father     brain   History   Social History  . Marital Status: Single    Spouse Name: N/A  . Number of Children: N/A  . Years of Education: N/A   Occupational  History  . Not on file.   Social History Main Topics  . Smoking status: Current Some Day Smoker    Types: Cigarettes    Last Attempt to Quit: 10/02/2014  . Smokeless tobacco: Not on file     Comment: uses a vapor -puff-4-5 x/day  . Alcohol Use: Yes     Comment: occasional  . Drug Use: No  . Sexual Activity: Not on file   Other Topics Concern  . Not on file   Social History Narrative    Review of Systems  Eyes: Positive for blurred vision.  Gastrointestinal: Negative for nausea, vomiting and abdominal pain.  Genitourinary: Positive for frequency.  Neurological: Positive for dizziness and headaches.  Endo/Heme/Allergies: Positive for polydipsia.  All other systems reviewed and are negative.    Objective:   Filed Vitals:   11/01/14 1718  BP: 136/82  Pulse: 62  Temp:   Resp:     Physical Exam  Constitutional: He is oriented to person, place, and time.  Cardiovascular: Normal rate, regular rhythm and normal heart sounds.   Pulmonary/Chest: Effort normal and breath sounds normal.  Abdominal: Soft.  Musculoskeletal: He exhibits no edema.       Right foot: There is deformity (2nd toe).       Left foot: There is  deformity (2nd toe).  Feet:  Right Foot:  Skin Integrity: Positive for callus and dry skin.  Left Foot:  Skin Integrity: Positive for callus and dry skin.  Neurological: He is alert and oriented to person, place, and time.  Skin: Skin is warm and dry.  Onychomycosis of BLE    Psychiatric: He has a normal mood and affect.     Lab Results  Component Value Date   WBC 7.4 10/23/2014   HGB 15.6 10/23/2014   HCT 44.2 10/23/2014   MCV 81.5 10/23/2014   PLT 292 10/23/2014   Lab Results  Component Value Date   CREATININE 0.96 10/23/2014   BUN 21 10/23/2014   NA 131* 10/23/2014   K 4.5 10/23/2014   CL 96 10/23/2014   CO2 25 10/23/2014    Lab Results  Component Value Date   HGBA1C 13.0* 10/23/2014   Lipid Panel  No results found for: CHOL, TRIG,  HDL, CHOLHDL, VLDL, LDLCALC     Assessment and plan:   Ryan Shaw was seen today for follow-up.  Diagnoses and all orders for this visit:  DM type 2, uncontrolled, with neuropathy Orders: -     Glucose (CBG) -     insulin aspart (novoLOG) injection 10 Units; Inject 0.1 mLs (10 Units total) into the skin once. -     Insulin Glargine (LANTUS SOLOSTAR) 100 UNIT/ML Solostar Pen; Inject 15 Units into the skin daily at 10 pm. -     metFORMIN (GLUCOPHAGE) 1000 MG tablet; Take 1 tablet (1,000 mg total) by mouth 2 (two) times daily with a meal. New dose--Take 1 pill twice per day -     Microalbumin, urine -     Lipid panel; Future -     PSA; Future Patient is still uncontrolled even with Metformin dose increase. I will add on Lantus 15 units nightly for better control.   Diabetic peripheral neuropathy Orders: -     gabapentin (NEURONTIN) 300 MG capsule; Take 2 capsules (600 mg total) by mouth 3 (three) times daily. Increased to TID. I have explained that he may have improvement in neuropathy once we get his BS controlled more. Addressed proper foot care with patient and stressed need for podiatry visit ASAP.    >50% of visit spent educating on diet, medication changes, how to use insulin, and long term complications of uncontrolled DM.  Return in about 2 weeks (around 11/15/2014) for Nurse Visit-cbg review and 3 mo PCP. Will place podiatry referral once he gets orange card. Please place referral at nurse visit.       Chari Manning, NP-C [facility] [phone] 11/01/2014, 4:29 PM

## 2014-11-01 NOTE — Patient Instructions (Signed)
Diabetes and Standards of Medical Care Diabetes is complicated. You may find that your diabetes team includes a dietitian, nurse, diabetes educator, eye doctor, and more. To help everyone know what is going on and to help you get the care you deserve, the following schedule of care was developed to help keep you on track. Below are the tests, exams, vaccines, medicines, education, and plans you will need. HbA1c test This test shows how well you have controlled your glucose over the past 2-3 months. It is used to see if your diabetes management plan needs to be adjusted.   It is performed at least 2 times a year if you are meeting treatment goals.  It is performed 4 times a year if therapy has changed or if you are not meeting treatment goals. Blood pressure test  This test is performed at every routine medical visit. The goal is less than 140/90 mm Hg for most people, but 130/80 mm Hg in some cases. Ask your health care provider about your goal. Dental exam  Follow up with the dentist regularly. Eye exam  If you are diagnosed with type 1 diabetes as a child, get an exam upon reaching the age of 37 years or older and have had diabetes for 3-5 years. Yearly eye exams are recommended after that initial eye exam.  If you are diagnosed with type 1 diabetes as an adult, get an exam within 5 years of diagnosis and then yearly.  If you are diagnosed with type 2 diabetes, get an exam as soon as possible after the diagnosis and then yearly. Foot care exam  Visual foot exams are performed at every routine medical visit. The exams check for cuts, injuries, or other problems with the feet.  A comprehensive foot exam should be done yearly. This includes visual inspection as well as assessing foot pulses and testing for loss of sensation.  Check your feet nightly for cuts, injuries, or other problems with your feet. Tell your health care provider if anything is not healing. Kidney function test (urine  microalbumin)  This test is performed once a year.  Type 1 diabetes: The first test is performed 5 years after diagnosis.  Type 2 diabetes: The first test is performed at the time of diagnosis.  A serum creatinine and estimated glomerular filtration rate (eGFR) test is done once a year to assess the level of chronic kidney disease (CKD), if present. Lipid profile (cholesterol, HDL, LDL, triglycerides)  Performed every 5 years for most people.  The goal for LDL is less than 100 mg/dL. If you are at high risk, the goal is less than 70 mg/dL.  The goal for HDL is 40 mg/dL-50 mg/dL for men and 50 mg/dL-60 mg/dL for women. An HDL cholesterol of 60 mg/dL or higher gives some protection against heart disease.  The goal for triglycerides is less than 150 mg/dL. Influenza vaccine, pneumococcal vaccine, and hepatitis B vaccine  The influenza vaccine is recommended yearly.  It is recommended that people with diabetes who are over 24 years old get the pneumonia vaccine. In some cases, two separate shots may be given. Ask your health care provider if your pneumonia vaccination is up to date.  The hepatitis B vaccine is also recommended for adults with diabetes. Diabetes self-management education  Education is recommended at diagnosis and ongoing as needed. Treatment plan  Your treatment plan is reviewed at every medical visit. Document Released: 05/17/2009 Document Revised: 12/04/2013 Document Reviewed: 12/20/2012 [facility] Patient Information 2015 [place],  [place]. This information is not intended to replace advice given to you by your health care provider. Make sure you discuss any questions you have with your health care provider.  

## 2014-11-02 LAB — MICROALBUMIN, URINE: Microalb, Ur: 0.7 mg/dL (ref <2.0)

## 2014-11-07 ENCOUNTER — Ambulatory Visit: Payer: Self-pay | Admitting: Internal Medicine

## 2014-11-12 ENCOUNTER — Ambulatory Visit: Payer: Self-pay | Attending: Internal Medicine | Admitting: *Deleted

## 2014-11-12 ENCOUNTER — Ambulatory Visit: Payer: Self-pay | Attending: Internal Medicine

## 2014-11-12 VITALS — BP 125/70 | HR 106 | Temp 98.2°F | Resp 20

## 2014-11-12 DIAGNOSIS — E1140 Type 2 diabetes mellitus with diabetic neuropathy, unspecified: Secondary | ICD-10-CM | POA: Insufficient documentation

## 2014-11-12 DIAGNOSIS — E1165 Type 2 diabetes mellitus with hyperglycemia: Secondary | ICD-10-CM | POA: Insufficient documentation

## 2014-11-12 DIAGNOSIS — IMO0002 Reserved for concepts with insufficient information to code with codable children: Secondary | ICD-10-CM

## 2014-11-12 DIAGNOSIS — Z720 Tobacco use: Secondary | ICD-10-CM | POA: Insufficient documentation

## 2014-11-12 DIAGNOSIS — Z794 Long term (current) use of insulin: Secondary | ICD-10-CM | POA: Insufficient documentation

## 2014-11-12 LAB — LIPID PANEL
CHOLESTEROL: 153 mg/dL (ref 0–200)
HDL: 48 mg/dL (ref >=40)
LDL Cholesterol: 81 mg/dL (ref 0–99)
Total CHOL/HDL Ratio: 3.2 Ratio
Triglycerides: 118 mg/dL (ref <150)
VLDL: 24 mg/dL (ref 0–40)

## 2014-11-12 LAB — GLUCOSE, POCT (MANUAL RESULT ENTRY): POC Glucose: 249 mg/dl — AB (ref 70–99)

## 2014-11-12 MED ORDER — INSULIN GLARGINE 100 UNIT/ML SOLOSTAR PEN: 20.0000 [IU] | PEN_INJECTOR | Freq: Every day | SUBCUTANEOUS | Status: DC

## 2014-11-12 MED ORDER — IBUPROFEN 600 MG PO TABS: 600.0000 mg | ORAL_TABLET | Freq: Four times a day (QID) | ORAL | Status: DC | PRN

## 2014-11-12 NOTE — Progress Notes (Signed)
Patient presents for CBG and record review for T2DM Med list reviewed; patient reports taking all meds as directed except didn't realize he could take gabapentin tid and has been taking bid Patient's AM fasting blood sugars ranging 162-491 (6 readings) Patient's before dinner blood sugars ranging 172-492 (6 readings) C/o 3 day headache rates 9/10 with no relief from 600 mg ibuprofen Patient's father died from brain aneurysm at age 49; patient concerned this h/a could be serious C/o severe LE neuropathy pain; rates 10/10 at present Patient has blurred vision; needs corrective lenses but doesn't have them at present States had episode of SHOB 2 days ago while climbing stairs States 4 hour episode this AM of non-radiating right-sided chest pain. None at present  CBG 249 2 hours after meal  BP 125/70 P 106 R 20 T 98.2 oral SpO2 98%  Per Medical Director: Increase lantus to 20 units qHS  Patient will increase gabapentin to 600 mg tid as previously directed  Patient to return in 2 weeks for nurse visit for CBG and record review  Patient given literature on Diabetes and Food, Diabetes and Exercise, Basic Carb Counting, Diabetes and Foot Care, The Plate Method and hypoglycemia

## 2014-11-12 NOTE — Patient Instructions (Signed)
Diabetes Mellitus and Food It is important for you to manage your blood sugar (glucose) level. Your blood glucose level can be greatly affected by what you eat. Eating healthier foods in the appropriate amounts throughout the day at about the same time each day will help you control your blood glucose level. It can also help slow or prevent worsening of your diabetes mellitus. Healthy eating may even help you improve the level of your blood pressure and reach or maintain a healthy weight.  HOW CAN FOOD AFFECT ME? Carbohydrates Carbohydrates affect your blood glucose level more than any other type of food. Your dietitian will help you determine how many carbohydrates to eat at each meal and teach you how to count carbohydrates. Counting carbohydrates is important to keep your blood glucose at a healthy level, especially if you are using insulin or taking certain medicines for diabetes mellitus. Alcohol Alcohol can cause sudden decreases in blood glucose (hypoglycemia), especially if you use insulin or take certain medicines for diabetes mellitus. Hypoglycemia can be a life-threatening condition. Symptoms of hypoglycemia (sleepiness, dizziness, and disorientation) are similar to symptoms of having too much alcohol.  If your health care provider has given you approval to drink alcohol, do so in moderation and use the following guidelines:  Women should not have more than one drink per day, and men should not have more than two drinks per day. One drink is equal to:  12 oz of beer.  5 oz of wine.  1 oz of hard liquor.  Do not drink on an empty stomach.  Keep yourself hydrated. Have water, diet soda, or unsweetened iced tea.  Regular soda, juice, and other mixers might contain a lot of carbohydrates and should be counted. WHAT FOODS ARE NOT RECOMMENDED? As you make food choices, it is important to remember that all foods are not the same. Some foods have fewer nutrients per serving than other  foods, even though they might have the same number of calories or carbohydrates. It is difficult to get your body what it needs when you eat foods with fewer nutrients. Examples of foods that you should avoid that are high in calories and carbohydrates but low in nutrients include:  Trans fats (most processed foods list trans fats on the Nutrition Facts label).  Regular soda.  Juice.  Candy.  Sweets, such as cake, pie, doughnuts, and cookies.  Fried foods. WHAT FOODS CAN I EAT? Have nutrient-rich foods, which will nourish your body and keep you healthy. The food you should eat also will depend on several factors, including:  The calories you need.  The medicines you take.  Your weight.  Your blood glucose level.  Your blood pressure level.  Your cholesterol level. You also should eat a variety of foods, including:  Protein, such as meat, poultry, fish, tofu, nuts, and seeds (lean animal proteins are best).  Fruits.  Vegetables.  Dairy products, such as milk, cheese, and yogurt (low fat is best).  Breads, grains, pasta, cereal, rice, and beans.  Fats such as olive oil, trans fat-free margarine, canola oil, avocado, and olives. DOES EVERYONE WITH DIABETES MELLITUS HAVE THE SAME MEAL PLAN? Because every person with diabetes mellitus is different, there is not one meal plan that works for everyone. It is very important that you meet with a dietitian who will help you create a meal plan that is just right for you. Document Released: 04/16/2005 Document Revised: 07/25/2013 Document Reviewed: 06/16/2013 [employer] Patient Information 2015 [employer]. This   information is not intended to replace advice given to you by your health care provider. Make sure you discuss any questions you have with your health care provider. Diabetes and Foot Care Diabetes may cause you to have problems because of poor blood supply (circulation) to your feet and legs. This may cause the skin on your  feet to become thinner, break easier, and heal more slowly. Your skin may become dry, and the skin may peel and crack. You may also have nerve damage in your legs and feet causing decreased feeling in them. You may not notice minor injuries to your feet that could lead to infections or more serious problems. Taking care of your feet is one of the most important things you can do for yourself.  HOME CARE INSTRUCTIONS  Wear shoes at all times, even in the house. Do not go barefoot. Bare feet are easily injured.  Check your feet daily for blisters, cuts, and redness. If you cannot see the bottom of your feet, use a mirror or ask someone for help.  Wash your feet with warm water (do not use hot water) and mild soap. Then pat your feet and the areas between your toes until they are completely dry. Do not soak your feet as this can dry your skin.  Apply a moisturizing lotion or petroleum jelly (that does not contain alcohol and is unscented) to the skin on your feet and to dry, brittle toenails. Do not apply lotion between your toes.  Trim your toenails straight across. Do not dig under them or around the cuticle. File the edges of your nails with an emery board or nail file.  Do not cut corns or calluses or try to remove them with medicine.  Wear clean socks or stockings every day. Make sure they are not too tight. Do not wear knee-high stockings since they may decrease blood flow to your legs.  Wear shoes that fit properly and have enough cushioning. To break in new shoes, wear them for just a few hours a day. This prevents you from injuring your feet. Always look in your shoes before you put them on to be sure there are no objects inside.  Do not cross your legs. This may decrease the blood flow to your feet.  If you find a minor scrape, cut, or break in the skin on your feet, keep it and the skin around it clean and dry. These areas may be cleansed with mild soap and water. Do not cleanse the area  with peroxide, alcohol, or iodine.  When you remove an adhesive bandage, be sure not to damage the skin around it.  If you have a wound, look at it several times a day to make sure it is healing.  Do not use heating pads or hot water bottles. They may burn your skin. If you have lost feeling in your feet or legs, you may not know it is happening until it is too late.  Make sure your health care provider performs a complete foot exam at least annually or more often if you have foot problems. Report any cuts, sores, or bruises to your health care provider immediately. SEEK MEDICAL CARE IF:   You have an injury that is not healing.  You have cuts or breaks in the skin.  You have an ingrown nail.  You notice redness on your legs or feet.  You feel burning or tingling in your legs or feet.  You have pain or cramps  in your legs and feet.  Your legs or feet are numb.  Your feet always feel cold. SEEK IMMEDIATE MEDICAL CARE IF:   There is increasing redness, swelling, or pain in or around a wound.  There is a red line that goes up your leg.  Pus is coming from a wound.  You develop a fever or as directed by your health care provider.  You notice a bad smell coming from an ulcer or wound. Document Released: 07/17/2000 Document Revised: 03/22/2013 Document Reviewed: 12/27/2012 [facility] Patient Information 2015 [place], [place]. This information is not intended to replace advice given to you by your health care provider. Make sure you discuss any questions you have with your health care provider. Diabetes and Exercise Exercising regularly is important. It is not just about losing weight. It has many health benefits, such as:  Improving your overall fitness, flexibility, and endurance.  Increasing your bone density.  Helping with weight control.  Decreasing your body fat.  Increasing your muscle strength.  Reducing stress and tension.  Improving your overall health. People  with diabetes who exercise gain additional benefits because exercise:  Reduces appetite.  Improves the body's use of blood sugar (glucose).  Helps lower or control blood glucose.  Decreases blood pressure.  Helps control blood lipids (such as cholesterol and triglycerides).  Improves the body's use of the hormone insulin by:  Increasing the body's insulin sensitivity.  Reducing the body's insulin needs.  Decreases the risk for heart disease because exercising:  Lowers cholesterol and triglycerides levels.  Increases the levels of good cholesterol (such as high-density lipoproteins [HDL]) in the body.  Lowers blood glucose levels. YOUR ACTIVITY PLAN  Choose an activity that you enjoy and set realistic goals. Your health care provider or diabetes educator can help you make an activity plan that works for you. Exercise regularly as directed by your health care provider. This includes:  Performing resistance training twice a week such as push-ups, sit-ups, lifting weights, or using resistance bands.  Performing 150 minutes of cardio exercises each week such as walking, running, or playing sports.  Staying active and spending no more than 90 minutes at one time being inactive. Even short bursts of exercise are good for you. Three 10-minute sessions spread throughout the day are just as beneficial as a single 30-minute session. Some exercise ideas include:  Taking the dog for a walk.  Taking the stairs instead of the elevator.  Dancing to your favorite song.  Doing an exercise video.  Doing your favorite exercise with a friend. RECOMMENDATIONS FOR EXERCISING WITH TYPE 1 OR TYPE 2 DIABETES   Check your blood glucose before exercising. If blood glucose levels are greater than 240 mg/dL, check for urine ketones. Do not exercise if ketones are present.  Avoid injecting insulin into areas of the body that are going to be exercised. For example, avoid injecting insulin  into:  The arms when playing tennis.  The legs when jogging.  Keep a record of:  Food intake before and after you exercise.  Expected peak times of insulin action.  Blood glucose levels before and after you exercise.  The type and amount of exercise you have done.  Review your records with your health care provider. Your health care provider will help you to develop guidelines for adjusting food intake and insulin amounts before and after exercising.  If you take insulin or oral hypoglycemic agents, watch for signs and symptoms of hypoglycemia. They include:  Dizziness.  Shaking.  Sweating.  Chills.  Confusion.  Drink plenty of water while you exercise to prevent dehydration or heat stroke. Body water is lost during exercise and must be replaced.  Talk to your health care provider before starting an exercise program to make sure it is safe for you. Remember, almost any type of activity is better than none. Document Released: 10/10/2003 Document Revised: 12/04/2013 Document Reviewed: 12/27/2012 [facility] Patient Information 2015 [place], [place]. This information is not intended to replace advice given to you by your health care provider. Make sure you discuss any questions you have with your health care provider. Basic Carbohydrate Counting for Diabetes Mellitus Carbohydrate counting is a method for keeping track of the amount of carbohydrates you eat. Eating carbohydrates naturally increases the level of sugar (glucose) in your blood, so it is important for you to know the amount that is okay for you to have in every meal. Carbohydrate counting helps keep the level of glucose in your blood within normal limits. The amount of carbohydrates allowed is different for every person. A dietitian can help you calculate the amount that is right for you. Once you know the amount of carbohydrates you can have, you can count the carbohydrates in the foods you want to eat. Carbohydrates are  found in the following foods:  Grains, such as breads and cereals.  Dried beans and soy products.  Starchy vegetables, such as potatoes, peas, and corn.  Fruit and fruit juices.  Milk and yogurt.  Sweets and snack foods, such as cake, cookies, candy, chips, soft drinks, and fruit drinks. CARBOHYDRATE COUNTING There are two ways to count the carbohydrates in your food. You can use either of the methods or a combination of both. Reading the "Nutrition Facts" on Packaged Food The "Nutrition Facts" is an area that is included on the labels of almost all packaged food and beverages in the [place]. It includes the serving size of that food or beverage and information about the nutrients in each serving of the food, including the grams (g) of carbohydrate per serving.  Decide the number of servings of this food or beverage that you will be able to eat or drink. Multiply that number of servings by the number of grams of carbohydrate that is listed on the label for that serving. The total will be the amount of carbohydrates you will be having when you eat or drink this food or beverage. Learning Standard Serving Sizes of Food When you eat food that is not packaged or does not include "Nutrition Facts" on the label, you need to measure the servings in order to count the amount of carbohydrates.A serving of most carbohydrate-rich foods contains about 15 g of carbohydrates. The following list includes serving sizes of carbohydrate-rich foods that provide 15 g ofcarbohydrate per serving:   1 slice of bread (1 oz) or 1 six-inch tortilla.    of a hamburger bun or English muffin.  4-6 crackers.   cup unsweetened dry cereal.    cup hot cereal.   cup rice or pasta.    cup mashed potatoes or  of a large baked potato.  1 cup fresh fruit or one small piece of fruit.    cup canned or frozen fruit or fruit juice.  1 cup milk.   cup plain fat-free yogurt or yogurt sweetened with  artificial sweeteners.   cup cooked dried beans or starchy vegetable, such as peas, corn, or potatoes.  Decide the number of standard-size servings that you will  eat. Multiply that number of servings by 15 (the grams of carbohydrates in that serving). For example, if you eat 2 cups of strawberries, you will have eaten 2 servings and 30 g of carbohydrates (2 servings x 15 g = 30 g). For foods such as soups and casseroles, in which more than one food is mixed in, you will need to count the carbohydrates in each food that is included. EXAMPLE OF CARBOHYDRATE COUNTING Sample Dinner  3 oz chicken breast.   cup of brown rice.   cup of corn.  1 cup milk.   1 cup strawberries with sugar-free whipped topping.  Carbohydrate Calculation Step 1: Identify the foods that contain carbohydrates:   Rice.   Corn.   Milk.   Strawberries. Step 2:Calculate the number of servings eaten of each:   2 servings of rice.   1 serving of corn.   1 serving of milk.   1 serving of strawberries. Step 3: Multiply each of those number of servings by 15 g:   2 servings of rice x 15 g = 30 g.   1 serving of corn x 15 g = 15 g.   1 serving of milk x 15 g = 15 g.   1 serving of strawberries x 15 g = 15 g. Step 4: Add together all of the amounts to find the total grams of carbohydrates eaten: 30 g + 15 g + 15 g + 15 g = 75 g. Document Released: 07/20/2005 Document Revised: 12/04/2013 Document Reviewed: 06/16/2013 [facility] Patient Information 2015 [place]. This information is not intended to replace advice given to you by your health care provider. Make sure you discuss any questions you have with your health care provider.

## 2014-11-20 ENCOUNTER — Ambulatory Visit: Payer: Self-pay | Attending: Internal Medicine

## 2014-11-20 ENCOUNTER — Telehealth: Payer: Self-pay | Admitting: *Deleted

## 2014-11-20 NOTE — Telephone Encounter (Signed)
Pt is aware of his lab results. 

## 2014-11-20 NOTE — Telephone Encounter (Signed)
-----   Message from Ambrose FinlandValerie A Keck, NP sent at 11/14/2014  5:45 PM EDT ----- Cholesterol looks great. Educate on diet and exercise

## 2014-12-06 ENCOUNTER — Ambulatory Visit: Payer: Self-pay

## 2014-12-06 ENCOUNTER — Ambulatory Visit: Payer: Self-pay | Attending: Internal Medicine

## 2014-12-06 DIAGNOSIS — IMO0002 Reserved for concepts with insufficient information to code with codable children: Secondary | ICD-10-CM

## 2014-12-06 DIAGNOSIS — E1165 Type 2 diabetes mellitus with hyperglycemia: Secondary | ICD-10-CM | POA: Insufficient documentation

## 2014-12-06 LAB — GLUCOSE, POCT (MANUAL RESULT ENTRY): POC Glucose: 174 mg/dl — AB (ref 70–99)

## 2014-12-06 NOTE — Progress Notes (Signed)
Patient in office today to have Glucose check.  He had a cup of coffee this morning with some sugar substitute in it.  Patient says he has been taking medication as directed.  Glucose was 174 today.  No refills needed today.  Patient will follow up with Holland CommonsValerie Keck, NP next week.

## 2014-12-06 NOTE — Patient Instructions (Signed)
Follow up with Holland CommonsValerie Keck, NP to discuss gabapentin changes.

## 2014-12-07 ENCOUNTER — Encounter: Payer: Self-pay | Admitting: Internal Medicine

## 2014-12-07 ENCOUNTER — Ambulatory Visit: Payer: Self-pay | Attending: Internal Medicine | Admitting: Internal Medicine

## 2014-12-07 VITALS — BP 124/89 | HR 113 | Temp 98.4°F | Resp 16 | Ht 78.0 in | Wt 212.0 lb

## 2014-12-07 DIAGNOSIS — G629 Polyneuropathy, unspecified: Secondary | ICD-10-CM

## 2014-12-07 DIAGNOSIS — E119 Type 2 diabetes mellitus without complications: Secondary | ICD-10-CM

## 2014-12-07 DIAGNOSIS — E1142 Type 2 diabetes mellitus with diabetic polyneuropathy: Secondary | ICD-10-CM

## 2014-12-07 DIAGNOSIS — Z794 Long term (current) use of insulin: Secondary | ICD-10-CM | POA: Insufficient documentation

## 2014-12-07 DIAGNOSIS — B351 Tinea unguium: Secondary | ICD-10-CM | POA: Insufficient documentation

## 2014-12-07 DIAGNOSIS — E1140 Type 2 diabetes mellitus with diabetic neuropathy, unspecified: Secondary | ICD-10-CM | POA: Insufficient documentation

## 2014-12-07 DIAGNOSIS — E1342 Other specified diabetes mellitus with diabetic polyneuropathy: Secondary | ICD-10-CM

## 2014-12-07 DIAGNOSIS — F17210 Nicotine dependence, cigarettes, uncomplicated: Secondary | ICD-10-CM | POA: Insufficient documentation

## 2014-12-07 LAB — GLUCOSE, POCT (MANUAL RESULT ENTRY): POC Glucose: 175 mg/dl — AB (ref 70–99)

## 2014-12-07 MED ORDER — TERBINAFINE HCL 250 MG PO TABS: 250.0000 mg | ORAL_TABLET | Freq: Every day | ORAL | Status: DC

## 2014-12-07 MED ORDER — TRAMADOL HCL 50 MG PO TABS: 50.0000 mg | ORAL_TABLET | Freq: Three times a day (TID) | ORAL | Status: DC | PRN

## 2014-12-07 NOTE — Progress Notes (Signed)
Patient ID: Ryan Shaw, male   DOB: 05-17-1966, 49 y.o.   MRN: [id number]  CC: pain in BLE, Left great toenail  HPI: Ryan Shaw is a 49 y.o. male here today for a follow up visit.  Patient has past medical history of T2DM with neuropathy. Patient reports that he has been having severe pain in his BLE. Upon last visit with me on 11/01/14 he was increased to Gabapentin 600 mg TID but states that it has not helped his pain any. Patient reports that he feels like his pain is worse than before the gabapentin. The pain in his BLE is affecting his sleep at night because it throbs all night long. He also reports burning in his legs and arms. He is concerned because his left great toenail came off last week which has not been painful or draining.   Patient has No headache, No chest pain, No abdominal pain - No Nausea, No Cough - SOB.  No Known Allergies Past Medical History  Diagnosis Date  . Diabetes mellitus without complication   . Neuropathy    Current Outpatient Prescriptions on File Prior to Visit  Medication Sig Dispense Refill  . Blood Glucose Monitoring Suppl (TRUERESULT BLOOD GLUCOSE) W/DEVICE KIT 1 each by Does not apply route as needed. 1 each 0  . gabapentin (NEURONTIN) 300 MG capsule Take 2 capsules (600 mg total) by mouth 3 (three) times daily. 180 capsule 4  . glucose blood (EQL TRUETEST TEST) test strip Use as instructed 100 each 12  . ibuprofen (ADVIL,MOTRIN) 600 MG tablet Take 1 tablet (600 mg total) by mouth every 6 (six) hours as needed. 30 tablet 0  . Insulin Glargine (LANTUS SOLOSTAR) 100 UNIT/ML Solostar Pen Inject 20 Units into the skin daily at 10 pm. 5 pen 5  . metFORMIN (GLUCOPHAGE) 1000 MG tablet Take 1 tablet (1,000 mg total) by mouth 2 (two) times daily with a meal. New dose--Take 1 pill twice per day 60 tablet 4  . TRUEPLUS LANCETS 28G MISC Check BS 4 times daily 1 each 0  . oxyCODONE-acetaminophen (PERCOCET/ROXICET) 5-325 MG per tablet Take 1 tablet by mouth every  4 (four) hours as needed for severe pain. (Patient not taking: Reported on 11/12/2014) 10 tablet 0   No current facility-administered medications on file prior to visit.   Family History  Problem Relation Age of Onset  . Cancer Mother   . Aneurysm Father     brain   History   Social History  . Marital Status: Single    Spouse Name: N/A  . Number of Children: N/A  . Years of Education: N/A   Occupational History  . Not on file.   Social History Main Topics  . Smoking status: Current Some Day Smoker    Types: Cigarettes    Last Attempt to Quit: 10/02/2014  . Smokeless tobacco: Not on file     Comment: uses a vapor -puff-4-5 x/day  . Alcohol Use: Yes     Comment: occasional  . Drug Use: No  . Sexual Activity: Not on file   Other Topics Concern  . Not on file   Social History Narrative    Review of Systems  Neurological: Positive for tingling.  All other systems reviewed and are negative.   Objective:   Filed Vitals:   12/07/14 1057  BP: 124/89  Pulse: 113  Temp: 98.4 F (36.9 C)  Resp: 16    Physical Exam  Constitutional: He is oriented to person, place, and  time.  Cardiovascular: Normal rate, regular rhythm and normal heart sounds.   Pulmonary/Chest: Effort normal and breath sounds normal.  Neurological: He is alert and oriented to person, place, and time.     Lab Results  Component Value Date   WBC 7.4 10/23/2014   HGB 15.6 10/23/2014   HCT 44.2 10/23/2014   MCV 81.5 10/23/2014   PLT 292 10/23/2014   Lab Results  Component Value Date   CREATININE 0.96 10/23/2014   BUN 21 10/23/2014   NA 131* 10/23/2014   K 4.5 10/23/2014   CL 96 10/23/2014   CO2 25 10/23/2014    Lab Results  Component Value Date   HGBA1C 13.0* 10/23/2014   Lipid Panel     Component Value Date/Time   CHOL 153 11/12/2014 0911   TRIG 118 11/12/2014 0911   HDL 48 11/12/2014 0911   CHOLHDL 3.2 11/12/2014 0911   VLDL 24 11/12/2014 0911   LDLCALC 81 11/12/2014 0911        Assessment and plan:   Ryan Shaw was seen today for follow-up.  Diagnoses and all orders for this visit:  Type 2 diabetes mellitus without complication Orders: -     Glucose (CBG) Per patient, cbg is improving   Diabetic peripheral neuropathy Orders: -     traMADol (ULTRAM) 50 MG tablet; Take 1 tablet (50 mg total) by mouth every 8 (eight) hours as needed. Continue gabapentin 600 mg twice per day and 900 mg at bedtime, may use tramadol as well. I have explained to patient that pain sometimes does not improve with gabapentin use and may eventually need Lyrica.   Onychomycosis Orders: -     COMPLETE METABOLIC PANEL WITH GFR; Future -     terbinafine (LAMISIL) 250 MG tablet; Take 1 tablet (250 mg total) by mouth daily. He will need to return in 1 month for LFT's.  Will put in urgent referral to podiatry once patient gets orange card  Return in about 4 weeks (around 01/04/2015) for Lab visit (LFT) and July for PCP.   Chari Manning, [facility] [phone] 12/07/2014, 11:35 AM'

## 2014-12-07 NOTE — Progress Notes (Signed)
Pt is here today having severe pain in his b/l legs and feet. Pt also states that his left great toenail fell off

## 2014-12-07 NOTE — Patient Instructions (Signed)
Diabetic Neuropathy Diabetic neuropathy is a nerve disease or nerve damage that is caused by diabetes mellitus. About half of all people with diabetes mellitus have some form of nerve damage. Nerve damage is more common in those who have had diabetes mellitus for many years and who generally have not had good control of their blood sugar (glucose) level. Diabetic neuropathy is a common complication of diabetes mellitus. There are three more common types of diabetic neuropathy and a fourth type that is less common and less understood:   Peripheral neuropathy--This is the most common type of diabetic neuropathy. It causes damage to the nerves of the feet and legs first and then eventually the hands and arms.The damage affects the ability to sense touch.  Autonomic neuropathy--This type causes damage to the autonomic nervous system, which controls the following functions:  Heartbeat.  Body temperature.  Blood pressure.  Urination.  Digestion.  Sweating.  Sexual function.  Focal neuropathy--Focal neuropathy can be painful and unpredictable and occurs most often in older adults with diabetes mellitus. It involves a specific nerve or one area and often comes on suddenly. It usually does not cause long-term problems.  Radiculoplexus neuropathy-- Sometimes called lumbosacral radiculoplexus neuropathy, radiculoplexus neuropathy affects the nerves of the thighs, hips, buttocks, or legs. It is more common in people with type 2 diabetes mellitus and in older men. It is characterized by debilitating pain, weakness, and atrophy, usually in the thigh muscles. CAUSES  The cause of peripheral, autonomic, and focal neuropathies is diabetes mellitus that is uncontrolled and high glucose levels. The cause of radiculoplexus neuropathy is unknown. However, it is thought to be caused by inflammation related to uncontrolled glucose levels. SIGNS AND SYMPTOMS  Peripheral Neuropathy Peripheral neuropathy develops  slowly over time. When the nerves of the feet and legs no longer work there may be:   Burning, stabbing, or aching pain in the legs or feet.  Inability to feel pressure or pain in your feet. This can lead to:  Thick calluses over pressure areas.  Pressure sores.  Ulcers.  Foot deformities.  Reduced ability to feel temperature changes.  Muscle weakness. Autonomic Neuropathy The symptoms of autonomic neuropathy vary depending on which nerves are affected. Symptoms may include:  Problems with digestion, such as:  Feeling sick to your stomach (nausea).  Vomiting.  Bloating.  Constipation.  Diarrhea.  Abdominal pain.  Difficulty with urination. This occurs if you lose your ability to sense when your bladder is full. Problems include:  Urine leakage (incontinence).  Inability to empty your bladder completely (retention).  Rapid or irregular heartbeat (palpitations).  Blood pressure drops when you stand up (orthostatic hypotension). When you stand up you may feel:  Dizzy.  Weak.  Faint.  In men, inability to attain and maintain an erection.  In women, vaginal dryness and problems with decreased sexual desire and arousal.  Problems with body temperature regulation.  Increased or decreased sweating. Focal Neuropathy  Abnormal eye movements or abnormal alignment of both eyes.  Weakness in the wrist.  Foot drop. This results in an inability to lift the foot properly and abnormal walking or foot movement.  Paralysis on one side of your face (Bell palsy).  Chest or abdominal pain. Radiculoplexus Neuropathy  Sudden, severe pain in your hip, thigh, or buttocks.  Weakness and wasting of thigh muscles.  Difficulty rising from a seated position.  Abdominal swelling.  Unexplained weight loss (usually more than 10 lb [4.5 kg]). DIAGNOSIS  Peripheral Neuropathy Your senses may   be tested. Sensory function testing can be done with:  A light touch using a  monofilament.  A vibration with tuning fork.  A sharp sensation with a pin prick. Other tests that can help diagnose neuropathy are:  Nerve conduction velocity. This test checks the transmission of an electrical current through a nerve.  Electromyography. This shows how muscles respond to electrical signals transmitted by nearby nerves.  Quantitative sensory testing. This is used to assess how your nerves respond to vibrations and changes in temperature. Autonomic Neuropathy Diagnosis is often based on reported symptoms. Tell your health care provider if you experience:   Dizziness.   Constipation.   Diarrhea.   Inappropriate urination or inability to urinate.   Inability to get or maintain an erection.  Tests that may be done include:   Electrocardiography or Holter monitor. These are tests that can help show problems with the heart rate or heart rhythm.   An X-ray exam may be done. Focal Neuropathy Diagnosis is made based on your symptoms and what your health care provider finds during your exam. Other tests may be done. They may include:  Nerve conduction velocities. This checks the transmission of electrical current through a nerve.  Electromyography. This shows how muscles respond to electrical signals transmitted by nearby nerves.  Quantitative sensory testing. This test is used to assess how your nerves respond to vibration and changes in temperature. Radiculoplexus Neuropathy  Often the first thing is to eliminate any other issue or problems that might be the cause, as there is no stick test for diagnosis.  X-ray exam of your spine and lumbar region.  Spinal tap to rule out cancer.  MRI to rule out other lesions. TREATMENT  Once nerve damage occurs, it cannot be reversed. The goal of treatment is to keep the disease or nerve damage from getting worse and affecting more nerve fibers. Controlling your blood glucose level is the key. Most people with  radiculoplexus neuropathy see at least a partial improvement over time. You will need to keep your blood glucose and HbA1c levels in the target range determined by your health care provider. Things that help control blood glucose levels include:   Blood glucose monitoring.   Meal planning.   Physical activity.   Diabetes medicine.  Over time, maintaining lower blood glucose levels helps lessen symptoms. Sometimes, prescription pain medicine is needed. HOME CARE INSTRUCTIONS:  Do not smoke.  Keep your blood glucose level in the range that you and your health care provider have determined acceptable for you.  Keep your blood pressure level in the range that you and your health care provider have determined acceptable for you.  Eat a well-balanced diet.  Be active every day.  Check your feet every day. SEEK MEDICAL CARE IF:   You have burning, stabbing, or aching pain in the legs or feet.  You are unable to feel pressure or pain in your feet.  You develop problems with digestion such as:  Nausea.  Vomiting.  Bloating.  Constipation.  Diarrhea.  Abdominal pain.  You have difficulty with urination, such as:  Incontinence.  Retention.  You have palpitations.  You develop orthostatic hypotension. When you stand up you may feel:  Dizzy.  Weak.  Faint.  You cannot attain and maintain an erection (in men).  You have vaginal dryness and problems with decreased sexual desire and arousal (in women).  You have severe pain in your thighs, legs, or buttocks.  You have unexplained weight loss.   Document Released: 09/28/2001 Document Revised: 05/10/2013 Document Reviewed: 12/29/2012 [employer] Patient Information 2015 [employer]. This information is not intended to replace advice given to you by your health care provider. Make sure you discuss any questions you have with your health care provider. 

## 2014-12-10 ENCOUNTER — Ambulatory Visit: Payer: Self-pay

## 2014-12-13 ENCOUNTER — Ambulatory Visit: Payer: Self-pay | Attending: Internal Medicine

## 2014-12-20 ENCOUNTER — Ambulatory Visit: Payer: Self-pay | Attending: Internal Medicine

## 2014-12-27 ENCOUNTER — Emergency Department (HOSPITAL_COMMUNITY): Payer: Self-pay

## 2014-12-27 ENCOUNTER — Emergency Department (HOSPITAL_COMMUNITY)
Admission: EM | Admit: 2014-12-27 | Discharge: 2014-12-27 | Disposition: A | Discharge status: Home / Self Care | Payer: Self-pay | Attending: Emergency Medicine | Admitting: Emergency Medicine

## 2014-12-27 ENCOUNTER — Encounter (HOSPITAL_COMMUNITY): Payer: Self-pay | Admitting: Emergency Medicine

## 2014-12-27 DIAGNOSIS — G629 Polyneuropathy, unspecified: Secondary | ICD-10-CM | POA: Insufficient documentation

## 2014-12-27 DIAGNOSIS — Z720 Tobacco use: Secondary | ICD-10-CM | POA: Insufficient documentation

## 2014-12-27 DIAGNOSIS — Z794 Long term (current) use of insulin: Secondary | ICD-10-CM | POA: Insufficient documentation

## 2014-12-27 DIAGNOSIS — E119 Type 2 diabetes mellitus without complications: Secondary | ICD-10-CM | POA: Insufficient documentation

## 2014-12-27 DIAGNOSIS — Z79899 Other long term (current) drug therapy: Secondary | ICD-10-CM | POA: Insufficient documentation

## 2014-12-27 LAB — CBG MONITORING, ED: Glucose-Capillary: 120 mg/dL — ABNORMAL HIGH (ref 65–99)

## 2014-12-27 MED ORDER — OXYCODONE-ACETAMINOPHEN 5-325 MG PO TABS: 1.0000 | ORAL_TABLET | ORAL | Status: DC | PRN

## 2014-12-27 MED ORDER — OXYCODONE-ACETAMINOPHEN 5-325 MG PO TABS
2.0000 | ORAL_TABLET | Freq: Once | ORAL | Status: AC
  Administered 2014-12-27: 2 via ORAL
  Filled 2014-12-27: qty 2

## 2014-12-27 NOTE — Discharge Instructions (Signed)
Read the information below.  Use the prescribed medication as directed.  Please discuss all new medications with your pharmacist.  Do not take additional tylenol while taking the prescribed pain medication to avoid overdose.  You may return to the Emergency Department at any time for worsening condition or any new symptoms that concern you.    If you develop uncontrolled pain, weakness or numbness of the extremity, severe discoloration of the skin, or you are unable to walk, return to the ER for a recheck.      Peripheral Neuropathy Peripheral neuropathy is a type of nerve damage. It affects nerves that carry signals between the spinal cord and other parts of the body. These are called peripheral nerves. With peripheral neuropathy, one nerve or a group of nerves may be damaged.  CAUSES  Many things can damage peripheral nerves. For some people with peripheral neuropathy, the cause is unknown. Some causes include:  Diabetes. This is the most common cause of peripheral neuropathy.  Injury to a nerve.  Pressure or stress on a nerve that lasts a long time.  Too little vitamin B. Alcoholism can lead to this.  Infections.  Autoimmune diseases, such as multiple sclerosis and systemic lupus erythematosus.  Inherited nerve diseases.  Some medicines, such as cancer drugs.  Toxic substances, such as lead and mercury.  Too little blood flowing to the legs.  Kidney disease.  Thyroid disease. SIGNS AND SYMPTOMS  Different people have different symptoms. The symptoms you have will depend on which of your nerves is damaged. Common symptoms include:  Loss of feeling (numbness) in the feet and hands.  Tingling in the feet and hands.  Pain that burns.  Very sensitive skin.  Weakness.  Not being able to move a part of the body (paralysis).  Muscle twitching.  Clumsiness or poor coordination.  Loss of balance.  Not being able to control your bladder.  Feeling dizzy.  Sexual  problems. DIAGNOSIS  Peripheral neuropathy is a symptom, not a disease. Finding the cause of peripheral neuropathy can be hard. To figure that out, your health care provider will take a medical history and do a physical exam. A neurological exam will also be done. This involves checking things affected by your brain, spinal cord, and nerves (nervous system). For example, your health care provider will check your reflexes, how you move, and what you can feel.  Other types of tests may also be ordered, such as:  Blood tests.  A test of the fluid in your spinal cord.  Imaging tests, such as CT scans or an MRI.  Electromyography (EMG). This test checks the nerves that control muscles.  Nerve conduction velocity tests. These tests check how fast messages pass through your nerves.  Nerve biopsy. A small piece of nerve is removed. It is then checked under a microscope. TREATMENT   Medicine is often used to treat peripheral neuropathy. Medicines may include:  Pain-relieving medicines. Prescription or over-the-counter medicine may be suggested.  Antiseizure medicine. This may be used for pain.  Antidepressants. These also may help ease pain from neuropathy.  Lidocaine. This is a numbing medicine. You might wear a patch or be given a shot.  Mexiletine. This medicine is typically used to help control irregular heart rhythms.  Surgery. Surgery may be needed to relieve pressure on a nerve or to destroy a nerve that is causing pain.  Physical therapy to help movement.  Assistive devices to help movement. HOME CARE INSTRUCTIONS   Only take  over-the-counter or prescription medicines as directed by your health care provider. Follow the instructions carefully for any given medicines. Do not take any other medicines without first getting approval from your health care provider.  If you have diabetes, work closely with your health care provider to keep your blood sugar under control.  If you  have numbness in your feet:  Check every day for signs of injury or infection. Watch for redness, warmth, and swelling.  Wear padded socks and comfortable shoes. These help protect your feet.  Do not do things that put pressure on your damaged nerve.  Do not smoke. Smoking keeps blood from getting to damaged nerves.  Avoid or limit alcohol. Too much alcohol can cause a lack of B vitamins. These vitamins are needed for healthy nerves.  Develop a good support system. Coping with peripheral neuropathy can be stressful. Talk to a mental health specialist or join a support group if you are struggling.  Follow up with your health care provider as directed. SEEK MEDICAL CARE IF:   You have new signs or symptoms of peripheral neuropathy.  You are struggling emotionally from dealing with peripheral neuropathy.  You have a fever. SEEK IMMEDIATE MEDICAL CARE IF:   You have an injury or infection that is not healing.  You feel very dizzy or begin vomiting.  You have chest pain.  You have trouble breathing. Document Released: 07/10/2002 Document Revised: 04/01/2011 Document Reviewed: 03/27/2013 [facility] Patient Information 2015 [place], [place]. This information is not intended to replace advice given to you by your health care provider. Make sure you discuss any questions you have with your health care provider.

## 2014-12-27 NOTE — ED Notes (Signed)
Pt to xray without distress.  

## 2014-12-27 NOTE — ED Provider Notes (Signed)
CSN: [id number]     Arrival date & time 12/27/14  0809 History   First MD Initiated Contact with Patient 12/27/14 [phone]     Chief Complaint  Patient presents with  . Peripheral Neuropathy     (Consider location/radiation/quality/duration/timing/severity/associated sxs/prior Treatment) The history is provided by the patient.    Pt with hx DM2 and peripheral neuropathy diagnosed 5 months ago.  Has had difficulty controlling bilateral foot pain, currently on gabapentin 622m in the morning and midday and 9049min the evening.  States his pain has gradually worsened.  Currently it feels like he is wearing boots on both feet, symptoms extend to his mid-lower leg.  Pain is worse at night, somewhat better with walking, no change with elevation or hanging legs down.  Has had longstanding low back pain that he states has never been evaluated.  Denies fevers, chills, abdominal pain, loss of control of bowel or bladder, weakness of the extremities, saddle anesthesia, bowel or urinary complaints.   Past Medical History  Diagnosis Date  . Diabetes mellitus without complication   . Neuropathy    History reviewed. No pertinent past surgical history. Family History  Problem Relation Age of Onset  . Cancer Mother   . Aneurysm Father     brain   History  Substance Use Topics  . Smoking status: Current Some Day Smoker    Types: Cigarettes    Last Attempt to Quit: 10/02/2014  . Smokeless tobacco: Not on file     Comment: uses a vapor -puff-4-5 x/day  . Alcohol Use: Yes     Comment: occasional    Review of Systems  All other systems reviewed and are negative.     Allergies  Review of patient's allergies indicates no known allergies.  Home Medications   Prior to Admission medications   Medication Sig Start Date End Date Taking? Authorizing Provider  acetaminophen (TYLENOL) 500 MG tablet Take 1,000 mg by mouth at bedtime as needed (pain).   Yes Historical Provider, MD  gabapentin  (NEURONTIN) 300 MG capsule Take 2 capsules (600 mg total) by mouth 3 (three) times daily. 11/01/14  Yes VaLance BoschNP  ibuprofen (ADVIL,MOTRIN) 600 MG tablet Take 1 tablet (600 mg total) by mouth every 6 (six) hours as needed. Patient taking differently: Take 600 mg by mouth at bedtime as needed (pain).  11/12/14  Yes VaLance BoschNP  Insulin Glargine (LANTUS SOLOSTAR) 100 UNIT/ML Solostar Pen Inject 20 Units into the skin daily at 10 pm. 11/12/14  Yes OlTresa GarterMD  metFORMIN (GLUCOPHAGE) 1000 MG tablet Take 1 tablet (1,000 mg total) by mouth 2 (two) times daily with a meal. New dose--Take 1 pill twice per day 11/01/14  Yes VaLance BoschNP  terbinafine (LAMISIL) 250 MG tablet Take 1 tablet (250 mg total) by mouth daily. 12/07/14  Yes VaLance BoschNP  traMADol (ULTRAM) 50 MG tablet Take 1 tablet (50 mg total) by mouth every 8 (eight) hours as needed. 12/07/14  Yes VaLance BoschNP  Blood Glucose Monitoring Suppl (TRUERESULT BLOOD GLUCOSE) W/DEVICE KIT 1 each by Does not apply route as needed. 10/24/14   LiMicheline ChapmanNP  glucose blood (EQL TRUETEST TEST) test strip Use as instructed 10/24/14   LiMicheline ChapmanNP  TRUEPLUS LANCETS 28G MISC Check BS 4 times daily 10/24/14   LiMicheline ChapmanNP   BP 122/71 mmHg  Pulse 95  Temp(Src) 98.1 F (36.7 C) (Oral)  Resp  18  Ht _0  (1.981 m)  Wt 212 lb (96.163 kg)  BMI 24.50 kg/m2  SpO2 100% Physical Exam  Constitutional: He appears well-developed and well-nourished. No distress.  HENT:  Head: Normocephalic and atraumatic.  Eyes: Conjunctivae are normal.  Neck: Neck supple.  Cardiovascular: Normal rate and regular rhythm.   Pulmonary/Chest: Effort normal and breath sounds normal. No respiratory distress. He has no wheezes. He has no rales.  Abdominal: Soft. He exhibits no distension and no mass. There is no tenderness. There is no rebound and no guarding.  Musculoskeletal: He exhibits no edema.       Back:  Pt moves toes  and feet without difficulty, able to raise his legs off the bed.  Altered sensation throughout bilateral feet to mid lower leg.  Dorsalis pedis pulses intact.   Neurological: He is alert. He exhibits normal muscle tone.  Skin: He is not diaphoretic.  Psychiatric: He has a normal mood and affect. His behavior is normal.  Nursing note and vitals reviewed.   ED Course  Procedures (including critical care time) Labs Review Labs Reviewed  CBG MONITORING, ED - Abnormal; Notable for the following:    Glucose-Capillary 120 (*)    All other components within normal limits    Imaging Review Dg Lumbar Spine Complete  12/27/2014   CLINICAL DATA:  Low back pain for 2 days, no known injury, initial encounter  EXAM: LUMBAR SPINE - COMPLETE 4+ VIEW  COMPARISON:  None.  FINDINGS: Vertebral body height is well maintained. No pars defects are noted. Mild osteophytic changes are seen. Very mild disc space narrowing is noted at L2-3 and L4-5. No spondylolisthesis is noted.  IMPRESSION: Mild degenerative change without acute abnormality.   Electronically Signed   By: Inez Catalina M.D.   On: 12/27/2014 09:33     EKG Interpretation None      MDM   Final diagnoses:  Peripheral neuropathy    Afebrile, nontoxic patient with chronic lower extremity diabetic neuropathy with uncontrolled pain.  No weakness on exam.  No abrupt change in symptoms. cbg 120.  Pt also with chronic low back pain without red flags, lumbar xray with only mild degenerative changes.  D/C home with percocet, close PCP follow up.  Discussed result, findings, treatment, and follow up  with patient.  Pt given return precautions.  Pt verbalizes understanding and agrees with plan.         Clayton Bibles, PA-C 12/27/14 1503  Evelina Bucy, MD 12/27/14 [phone]

## 2014-12-27 NOTE — ED Notes (Signed)
Pt c/o of neuropathy pain in bilateral legs/feet that started yesterday pain 10/10. Pt sts it feels like he has a pair of boots on his feet. Pt sts he takes gabapentin for pain but sts it does not work anymore.

## 2015-01-01 ENCOUNTER — Telehealth: Payer: Self-pay | Admitting: Internal Medicine

## 2015-01-01 NOTE — Telephone Encounter (Signed)
Pt states he feels like he needs to urinate but can not go. He also stated that he has back pain on right side. His pain is 8-9 on a 10 point scale. Please follow up with pt for advise.

## 2015-01-03 ENCOUNTER — Other Ambulatory Visit: Payer: Self-pay

## 2015-01-03 ENCOUNTER — Encounter: Payer: Self-pay | Admitting: Internal Medicine

## 2015-01-03 ENCOUNTER — Ambulatory Visit: Payer: Self-pay | Attending: Internal Medicine | Admitting: Internal Medicine

## 2015-01-03 VITALS — BP 134/76 | HR 119 | Temp 98.4°F | Resp 18 | Ht 78.0 in | Wt 212.8 lb

## 2015-01-03 DIAGNOSIS — M545 Low back pain, unspecified: Secondary | ICD-10-CM

## 2015-01-03 DIAGNOSIS — E1342 Other specified diabetes mellitus with diabetic polyneuropathy: Secondary | ICD-10-CM

## 2015-01-03 DIAGNOSIS — E1140 Type 2 diabetes mellitus with diabetic neuropathy, unspecified: Secondary | ICD-10-CM | POA: Insufficient documentation

## 2015-01-03 DIAGNOSIS — B351 Tinea unguium: Secondary | ICD-10-CM

## 2015-01-03 DIAGNOSIS — G629 Polyneuropathy, unspecified: Secondary | ICD-10-CM

## 2015-01-03 DIAGNOSIS — R0989 Other specified symptoms and signs involving the circulatory and respiratory systems: Secondary | ICD-10-CM

## 2015-01-03 DIAGNOSIS — R000 Tachycardia, unspecified: Secondary | ICD-10-CM

## 2015-01-03 DIAGNOSIS — IMO0002 Reserved for concepts with insufficient information to code with codable children: Secondary | ICD-10-CM

## 2015-01-03 DIAGNOSIS — E1142 Type 2 diabetes mellitus with diabetic polyneuropathy: Secondary | ICD-10-CM

## 2015-01-03 DIAGNOSIS — E1165 Type 2 diabetes mellitus with hyperglycemia: Secondary | ICD-10-CM

## 2015-01-03 LAB — POCT GLYCOSYLATED HEMOGLOBIN (HGB A1C): HEMOGLOBIN A1C: 8.9

## 2015-01-03 LAB — GLUCOSE, POCT (MANUAL RESULT ENTRY)
POC Glucose: 302 mg/dl — AB (ref 70–99)
POC Glucose: 332 mg/dl — AB (ref 70–99)

## 2015-01-03 MED ORDER — TRAMADOL HCL 50 MG PO TABS: 50.0000 mg | ORAL_TABLET | Freq: Three times a day (TID) | ORAL | Status: DC | PRN

## 2015-01-03 MED ORDER — TRUEPLUS LANCETS 30G MISC: Status: DC

## 2015-01-03 MED ORDER — KETOROLAC TROMETHAMINE 60 MG/2ML IM SOLN
60.0000 mg | Freq: Once | INTRAMUSCULAR | Status: AC
  Administered 2015-01-03: 60 mg via INTRAMUSCULAR

## 2015-01-03 NOTE — Progress Notes (Signed)
Patient ID: Ryan Shaw, male   DOB: [birth year], 49 y.o.   MRN: [id number]  CC: neuropathy  HPI: Ryan Shaw is a 49 y.o. male here today for a follow up visit.  Patient has past medical history of T2DM and neuropathy.  He presents to clinic today stating that he "feels like needles are sticking in me all over my whole body and I cannot sleep at night". Patient states he cannot tie his shoes because his ankles are swollen. Patient also states he has generalized weakness in both legs. Went to the ED Monday for this reason and got 2 percocet which helped his pain. Patient states that they diagnosed him with arthritis in his back. The pain is 10/10 all over, but mainly his legs and feet. Pain is dull in feet, but sharp from ankles up. CBG is 332 today and he hasn't been feeling well. Patient smokes 4-5 cigarettes per day. Patient was asked if he was sad/depressed and he replied, "honestly, yes."    Patient has No headache, No chest pain, No abdominal pain - No Nausea, No Cough - SOB.  No Known Allergies Past Medical History  Diagnosis Date  . Diabetes mellitus without complication   . Neuropathy    Current Outpatient Prescriptions on File Prior to Visit  Medication Sig Dispense Refill  . Blood Glucose Monitoring Suppl (TRUERESULT BLOOD GLUCOSE) W/DEVICE KIT 1 each by Does not apply route as needed. 1 each 0  . gabapentin (NEURONTIN) 300 MG capsule Take 2 capsules (600 mg total) by mouth 3 (three) times daily. 180 capsule 4  . glucose blood (EQL TRUETEST TEST) test strip Use as instructed 100 each 12  . ibuprofen (ADVIL,MOTRIN) 600 MG tablet Take 1 tablet (600 mg total) by mouth every 6 (six) hours as needed. (Patient taking differently: Take 600 mg by mouth at bedtime as needed (pain). ) 30 tablet 0  . Insulin Glargine (LANTUS SOLOSTAR) 100 UNIT/ML Solostar Pen Inject 20 Units into the skin daily at 10 pm. 5 pen 5  . metFORMIN (GLUCOPHAGE) 1000 MG tablet Take 1 tablet (1,000 mg total) by  mouth 2 (two) times daily with a meal. New dose--Take 1 pill twice per day 60 tablet 4  . terbinafine (LAMISIL) 250 MG tablet Take 1 tablet (250 mg total) by mouth daily. 30 tablet 0  . traMADol (ULTRAM) 50 MG tablet Take 1 tablet (50 mg total) by mouth every 8 (eight) hours as needed. 60 tablet 0  . TRUEPLUS LANCETS 28G MISC Check BS 4 times daily 1 each 0  . acetaminophen (TYLENOL) 500 MG tablet Take 1,000 mg by mouth at bedtime as needed (pain).    Marland Kitchen oxyCODONE-acetaminophen (PERCOCET/ROXICET) 5-325 MG per tablet Take 1-2 tablets by mouth every 4 (four) hours as needed for severe pain. (Patient not taking: Reported on 01/03/2015) 15 tablet 0   No current facility-administered medications on file prior to visit.   Family History  Problem Relation Age of Onset  . Cancer Mother   . Aneurysm Father     brain   History   Social History  . Marital Status: Single    Spouse Name: N/A  . Number of Children: N/A  . Years of Education: N/A   Occupational History  . Not on file.   Social History Main Topics  . Smoking status: Current Some Day Smoker -- 0.25 packs/day    Types: Cigarettes  . Smokeless tobacco: Not on file     Comment: uses a vapor -puff-4-5 x/day  .  Alcohol Use: No     Comment: patient states he quit drinking since he has been sick  . Drug Use: No  . Sexual Activity: Not on file   Other Topics Concern  . Not on file   Social History Narrative    Review of Systems: See HPI   Objective:   Filed Vitals:   01/03/15 1716  BP: 134/76  Pulse: 119  Temp: 98.4 F (36.9 C)  Resp: 18    Physical Exam  Constitutional: He is oriented to person, place, and time.  Cardiovascular: Regular rhythm and normal heart sounds.   Tachycardia   Pulmonary/Chest: Effort normal and breath sounds normal.  Musculoskeletal: He exhibits no edema.  Neurological: He is alert and oriented to person, place, and time.  Limited sensation of bilateral lower extremities    .  Lab  Results  Component Value Date   WBC 7.4 10/23/2014   HGB 15.6 10/23/2014   HCT 44.2 10/23/2014   MCV 81.5 10/23/2014   PLT 292 10/23/2014   Lab Results  Component Value Date   CREATININE 0.96 10/23/2014   BUN 21 10/23/2014   NA 131* 10/23/2014   K 4.5 10/23/2014   CL 96 10/23/2014   CO2 25 10/23/2014    Lab Results  Component Value Date   HGBA1C 13.0* 10/23/2014   Lipid Panel     Component Value Date/Time   CHOL 153 11/12/2014 0911   TRIG 118 11/12/2014 0911   HDL 48 11/12/2014 0911   CHOLHDL 3.2 11/12/2014 0911   VLDL 24 11/12/2014 0911   LDLCALC 81 11/12/2014 0911       Assessment and plan:   Diagnoses and all orders for this visit:  DM type 2, uncontrolled, with neuropathy Orders: -     Glucose (CBG) -     HgB A1c -     ketorolac (TORADOL) injection 60 mg; Inject 2 mLs (60 mg total) into the muscle once. -     Glucose (CBG)  Diabetic peripheral neuropathy Orders: -     traMADol (ULTRAM) 50 MG tablet; Take 1 tablet (50 mg total) by mouth every 8 (eight) hours as needed. -     TRUEPLUS LANCETS 30G MISC; Check sugars three times per day Patient was able to see Podiatry while in clinic today. He has been recommended to take B complex vitamin. He will likely require pain management but patient is uninsured.   Tachycardia Orders: -     EKG 12-Lead EKG: sinus tachycardia. Likely a result of severe neuropathy pain   Diminished pedal pulses  Order placed for ABI and BLE arterial duplex studies. Results will be forwarded to Dr. Earleen Newport with podiatry.   Onychomycosis Orders: -     Hepatic Function Panel Will refill lamisil for last 2 months   Right-sided low back pain without sciatica Orders: -     Ambulatory referral to Orthopedic Surgery  F/u as scheduled   Chari Manning, [facility] [phone] 01/03/2015, 5:44 PM

## 2015-01-03 NOTE — Progress Notes (Signed)
Patient presents in clinic today stating that he "feels like needles are sticking in me all over my whole body and I cannot sleep at night"  Patient states he cannot tie his shoes because his ankles are swollen.  Patient also states he has generalized weakness in both legs.  Went to the ED Monday for this reason and got 2 percocet.  Patient was instructed to follow up with doctor.  Patient states that they diagnosed him with arthritis in his back.  Patient states that pain is 10/10 all over, but mainly his legs and feet.  Pain is dull in feet, but sharp from ankles up.  Patient CBG 332, and states that he hasn't been feeling well.  Patient smokes 4-5 cigarettes per day.  Patient was asked if he was sad/depressed and he replied, "honestly, yes."  Patient was given depression screening questionnaire to complete.

## 2015-01-04 LAB — HEPATIC FUNCTION PANEL
ALT: 34 U/L (ref 0–53)
AST: 19 U/L (ref 0–37)
Albumin: 3.9 g/dL (ref 3.5–5.2)
Alkaline Phosphatase: 80 U/L (ref 39–117)
Bilirubin, Direct: 0.1 mg/dL (ref 0.0–0.3)
Indirect Bilirubin: 0.1 mg/dL — ABNORMAL LOW (ref 0.2–1.2)
Total Bilirubin: 0.2 mg/dL (ref 0.2–1.2)
Total Protein: 6.3 g/dL (ref 6.0–8.3)

## 2015-01-05 MED ORDER — TERBINAFINE HCL 250 MG PO TABS: 250.0000 mg | ORAL_TABLET | Freq: Every day | ORAL | Status: DC

## 2015-01-08 ENCOUNTER — Telehealth: Payer: Self-pay | Admitting: *Deleted

## 2015-01-08 NOTE — Telephone Encounter (Signed)
Scheduled patient's ABI and lower extremity arterial doppler-the appointment time was inconvenient for the patient.  I gave patient the telephone number [facility] [phone] and he understood to call them to reschedule.  I also told patient his medication refill for his foot fungus was ready.

## 2015-01-11 ENCOUNTER — Ambulatory Visit (INDEPENDENT_AMBULATORY_CARE_PROVIDER_SITE_OTHER)
Admission: RE | Admit: 2015-01-11 | Discharge: 2015-01-11 | Disposition: A | Discharge status: Home / Self Care | Payer: Self-pay | Source: Ambulatory Visit | Attending: Internal Medicine | Admitting: Internal Medicine

## 2015-01-11 ENCOUNTER — Ambulatory Visit (HOSPITAL_COMMUNITY)
Admission: RE | Admit: 2015-01-11 | Discharge: 2015-01-11 | Disposition: A | Discharge status: Home / Self Care | Payer: Self-pay | Source: Ambulatory Visit | Attending: Internal Medicine | Admitting: Internal Medicine

## 2015-01-11 DIAGNOSIS — R0989 Other specified symptoms and signs involving the circulatory and respiratory systems: Secondary | ICD-10-CM

## 2015-01-11 DIAGNOSIS — I70202 Unspecified atherosclerosis of native arteries of extremities, left leg: Secondary | ICD-10-CM | POA: Insufficient documentation

## 2015-01-11 DIAGNOSIS — E1140 Type 2 diabetes mellitus with diabetic neuropathy, unspecified: Secondary | ICD-10-CM | POA: Insufficient documentation

## 2015-01-11 NOTE — Progress Notes (Addendum)
VASCULAR LAB PRELIMINARY  ARTERIAL  ABI completed:    RIGHT    LEFT    PRESSURE WAVEFORM  PRESSURE WAVEFORM  BRACHIAL 138  Triphasic BRACHIAL 131 Triphasic  DP 149 Triphasic DP 143 Triphasic  PT 148 Triphasic PT 147 Triphasic  GREAT TOE 92  GREAT TOE 106     RIGHT LEFT  ABI / TBI  1.08 / 0.67 1.07 / 0.77   ABIs and Doppler waveforms are within normal limits bilaterally. TBIs indicate adequate perfusion bilaterally.  Zamia Tyminski, RVS 01/11/2015, 1:51 PM

## 2015-01-11 NOTE — Progress Notes (Signed)
VASCULAR LAB PRELIMINARY  ARTERIAL  ABI completed:    RIGHT    LEFT    PRESSURE WAVEFORM  PRESSURE WAVEFORM  BRACHIAL 138 Triphasic BRACHIAL 131 Triphasic  DP 149 Triphasic DP 143 Triphasic  PT 148 Triphasic PT 147 Triphasic  GREAT TOE 92  GREAT TOE 106     RIGHT LEFT  ABI / TBI 1.08/0.67 1.07/0.77   ABIs and TBIs are within normal limits bilaterally. Doppler waveforms are normal bilaterally. TBIs indicate adequate perfusion bilaterally.  Bilateral lower extremity arterial duplex revealed  no evidence of stenosis or significant plaque. All Doppler waveforms are triphasic.  Ryan Shaw, RVS 01/11/2015, 2:02 PM

## 2015-01-15 ENCOUNTER — Telehealth: Payer: Self-pay | Admitting: *Deleted

## 2015-01-15 NOTE — Telephone Encounter (Signed)
Spoke with patient to let him know  He has good blood flow to his legs. No significant stenosis

## 2015-01-15 NOTE — Telephone Encounter (Signed)
-----   Message from Ambrose Finland, NP sent at 01/14/2015  6:30 PM EDT ----- He has good blood flow to his legs. No significant stenosis

## 2015-01-18 ENCOUNTER — Other Ambulatory Visit: Payer: Self-pay | Admitting: Internal Medicine

## 2015-01-18 DIAGNOSIS — IMO0002 Reserved for concepts with insufficient information to code with codable children: Secondary | ICD-10-CM

## 2015-01-18 DIAGNOSIS — E1140 Type 2 diabetes mellitus with diabetic neuropathy, unspecified: Secondary | ICD-10-CM

## 2015-01-18 DIAGNOSIS — E1165 Type 2 diabetes mellitus with hyperglycemia: Principal | ICD-10-CM

## 2015-01-18 MED ORDER — INSULIN GLARGINE 100 UNIT/ML SOLOSTAR PEN: 20.0000 [IU] | PEN_INJECTOR | Freq: Every day | SUBCUTANEOUS | Status: DC

## 2015-01-21 ENCOUNTER — Emergency Department (HOSPITAL_COMMUNITY): Payer: Self-pay

## 2015-01-21 ENCOUNTER — Emergency Department (HOSPITAL_COMMUNITY)
Admission: EM | Admit: 2015-01-21 | Discharge: 2015-01-21 | Disposition: A | Discharge status: Home / Self Care | Payer: Self-pay | Attending: Emergency Medicine | Admitting: Emergency Medicine

## 2015-01-21 ENCOUNTER — Encounter (HOSPITAL_COMMUNITY): Payer: Self-pay | Admitting: *Deleted

## 2015-01-21 DIAGNOSIS — G629 Polyneuropathy, unspecified: Secondary | ICD-10-CM | POA: Insufficient documentation

## 2015-01-21 DIAGNOSIS — R600 Localized edema: Secondary | ICD-10-CM | POA: Insufficient documentation

## 2015-01-21 DIAGNOSIS — Z720 Tobacco use: Secondary | ICD-10-CM | POA: Insufficient documentation

## 2015-01-21 DIAGNOSIS — E119 Type 2 diabetes mellitus without complications: Secondary | ICD-10-CM | POA: Insufficient documentation

## 2015-01-21 DIAGNOSIS — Z79899 Other long term (current) drug therapy: Secondary | ICD-10-CM | POA: Insufficient documentation

## 2015-01-21 LAB — CBG MONITORING, ED
GLUCOSE-CAPILLARY: 150 mg/dL — AB (ref 65–99)
Glucose-Capillary: 146 mg/dL — ABNORMAL HIGH (ref 65–99)

## 2015-01-21 MED ORDER — HYDROCODONE-ACETAMINOPHEN 5-325 MG PO TABS
1.0000 | ORAL_TABLET | Freq: Once | ORAL | Status: AC
  Administered 2015-01-21: 1 via ORAL
  Filled 2015-01-21: qty 1

## 2015-01-21 NOTE — ED Notes (Signed)
CBG 146 

## 2015-01-21 NOTE — ED Notes (Signed)
Pt states that he noticed his feet swelling this morning (right worse than left). Pt states that he has diabetes and has tingling to his feet as well.

## 2015-01-21 NOTE — ED Provider Notes (Signed)
CSN: [id number]     Arrival date & time 01/21/15  1712 History   First MD Initiated Contact with Patient 01/21/15 1902     Chief Complaint  Patient presents with  . Foot Swelling     (Consider location/radiation/quality/duration/timing/severity/associated sxs/prior Treatment) HPI  Ryan Shaw is a 49 y.o. male with PMH of diabetes and neuropathy presenting with persistent numbness tingling to lower extremities with new 2-3 day history swelling of bilateral toes with some swelling to other toes. He denies injury. Numbness tingling is not worse in the big toe compared to the rest of the toes. No erythema. No pain with movement. Patient takes gabapentin 600 mg in the morning and 900 mg in the evening first neuropathy. Patient denies any fevers, chills, nausea, vomiting or illness recently.   Past Medical History  Diagnosis Date  . Diabetes mellitus without complication   . Neuropathy    History reviewed. No pertinent past surgical history. Family History  Problem Relation Age of Onset  . Cancer Mother   . Aneurysm Father     brain   History  Substance Use Topics  . Smoking status: Current Some Day Smoker -- 0.25 packs/day    Types: Cigarettes  . Smokeless tobacco: Not on file     Comment: uses a vapor -puff-4-5 x/day  . Alcohol Use: No     Comment: patient states he quit drinking since he has been sick    Review of Systems 10 Systems reviewed and are negative for acute change except as noted in the HPI.    Allergies  Review of patient's allergies indicates no known allergies.  Home Medications   Prior to Admission medications   Medication Sig Start Date End Date Taking? Authorizing Provider  acetaminophen (TYLENOL) 500 MG tablet Take 1,000 mg by mouth at bedtime as needed (pain).   Yes Historical Provider, MD  gabapentin (NEURONTIN) 300 MG capsule Take 2 capsules (600 mg total) by mouth 3 (three) times daily. 11/01/14  Yes Ambrose Finland, NP  ibuprofen (ADVIL,MOTRIN)  600 MG tablet Take 1 tablet (600 mg total) by mouth every 6 (six) hours as needed. Patient taking differently: Take 600 mg by mouth at bedtime as needed (pain).  11/12/14  Yes Ambrose Finland, NP  Insulin Glargine (LANTUS SOLOSTAR) 100 UNIT/ML Solostar Pen Inject 20 Units into the skin daily at 10 pm. 01/18/15  Yes Quentin Angst, MD  metFORMIN (GLUCOPHAGE) 1000 MG tablet Take 1 tablet (1,000 mg total) by mouth 2 (two) times daily with a meal. New dose--Take 1 pill twice per day 11/01/14  Yes Ambrose Finland, NP  terbinafine (LAMISIL) 250 MG tablet Take 1 tablet (250 mg total) by mouth daily. 01/05/15  Yes Ambrose Finland, NP  traMADol (ULTRAM) 50 MG tablet Take 1 tablet (50 mg total) by mouth every 8 (eight) hours as needed. Patient taking differently: Take 50 mg by mouth every 8 (eight) hours as needed for moderate pain.  01/03/15  Yes Ambrose Finland, NP   BP 135/62 mmHg  Pulse 94  Temp(Src) 99.4 F (37.4 C) (Rectal)  Resp 16  Ht  (1.981 m)  Wt 212 lb (96.163 kg)  BMI 24.50 kg/m2  SpO2 100% Physical Exam  Constitutional: He appears well-developed and well-nourished. No distress.  HENT:  Head: Normocephalic and atraumatic.  Eyes: Conjunctivae and EOM are normal. Right eye exhibits no discharge. Left eye exhibits no discharge.  Cardiovascular: Normal rate and regular rhythm.   2+ DP and  PT pulses equal bilaterally  Pulmonary/Chest: Effort normal and breath sounds normal. No respiratory distress. He has no wheezes.  Abdominal: Soft. Bowel sounds are normal. He exhibits no distension. There is no tenderness.  Musculoskeletal:  Full range of motion of bilateral toes and ankles.  No significant tenderness to toes. Mild edema to bilateral great toes. No erythema, red streaks, lesions. No swelling to feet, ankles, leg or calf. No calf tenderness or erythema.  Neurological: He is alert. He exhibits normal muscle tone. Coordination normal.  5 out of 5 strength bilateral lower extremities.  Decreased sensation to distal toes bilaterally  Skin: Skin is warm and dry. He is not diaphoretic.  Nursing note and vitals reviewed.        ED Course  Procedures (including critical care time) Labs Review Labs Reviewed  CBG MONITORING, ED - Abnormal; Notable for the following:    Glucose-Capillary 150 (*)    All other components within normal limits  CBG MONITORING, ED - Abnormal; Notable for the following:    Glucose-Capillary 146 (*)    All other components within normal limits    Imaging Review Dg Foot Complete Left  01/21/2015   CLINICAL DATA:  49 year old male with type 2 diabetes with neuropathy, presenting with new discoloration in scarring throughout the toes of both feet.  EXAM: RIGHT FOOT COMPLETE - 3+ VIEW; LEFT FOOT - COMPLETE 3+ VIEW  COMPARISON:  No priors.  FINDINGS: Lucency in the tuft of the second distal phalanx. No acute displaced fracture, subluxation or dislocation. Small plantar calcaneal spur. Mild joint space narrowing and subchondral sclerosis at the first MTP joint, with mild hallux valgus.  IMPRESSION: 1. Lucency in the lateral aspect of the tuft of the second distal phalanx. Clinical correlation for signs and symptoms of infection of the distal aspect of this toe is recommended, as this could be seen in the setting of osteomyelitis. 2. Additional incidental findings, as above.   Electronically Signed   By: Trudie Reed M.D.   On: 01/21/2015 20:18   Dg Foot Complete Right  01/21/2015   CLINICAL DATA:  49 year old male with type 2 diabetes with neuropathy, presenting with new discoloration in scarring throughout the toes of both feet.  EXAM: RIGHT FOOT COMPLETE - 3+ VIEW; LEFT FOOT - COMPLETE 3+ VIEW  COMPARISON:  No priors.  FINDINGS: Lucency in the tuft of the second distal phalanx. No acute displaced fracture, subluxation or dislocation. Small plantar calcaneal spur. Mild joint space narrowing and subchondral sclerosis at the first MTP joint, with mild  hallux valgus.  IMPRESSION: 1. Lucency in the lateral aspect of the tuft of the second distal phalanx. Clinical correlation for signs and symptoms of infection of the distal aspect of this toe is recommended, as this could be seen in the setting of osteomyelitis. 2. Additional incidental findings, as above.   Electronically Signed   By: Trudie Reed M.D.   On: 01/21/2015 20:18     EKG Interpretation None      MDM   Final diagnoses:  Edema of toe   Patient presenting with bilateral great toe swelling. No injury. Neurovascularly intact. Full range of motion without significant pain. No erythema. X-ray without evidence of acute abnormality. Second great toe on right with lucency of the lateral aspect. On exam patient has discoloration. He states he has had this for over one year. He denies any infectious symptoms. There is no pus or ulcer. Patient to follow-up with primary care provider for further workup  for possible osteomyelitis.  It has been determined that no acute conditions requiring further emergency intervention are present at this time. The patient have been advised of the diagnosis and plan. We have discussed signs and symptoms that warrant return to the ED, such as changes or worsening in symptoms.  Case has been discussed with Dr. Freida Busman who agrees with the above plan and to discharge.        Oswaldo Conroy, PA-C 01/21/15 2145  Lorre Nick, MD 01/24/15 [phone]

## 2015-01-21 NOTE — Discharge Instructions (Signed)
Return to the emergency room with worsening of symptoms, new symptoms or with symptoms that are concerning, especially fevers, redness, swelling, unable to move toes, severe pain. Please call your doctor for a followup appointment within 24-48 hours. When you talk to your doctor please let them know that you were seen in the emergency department and have them acquire all of your records so that they can discuss the findings with you and formulate a treatment plan to fully care for your new and ongoing problems.  Read below information and follow recommendations. Bone and Joint Infections Joint infections are called septic or infectious arthritis. An infected joint may damage cartilage and tissue very quickly. This may destroy the joint. Bone infections (osteomyelitis) may last for years. Joints may become stiff if left untreated. Bacteria are the most common cause. Other causes include viruses and fungi, but these are more rare. Bone and joint infections usually come from injury or infection elsewhere in your body; the germs are carried to your bones or joints through the bloodstream.  CAUSES   Blood-carried germs from an infection elsewhere in your body can eventually spread to a bone or joint. The germ staphylococcus is the most common cause of both osteomyelitis and septic arthritis.  An injury can introduce germs into your bones or joints. SYMPTOMS   Weight loss.  Tiredness.  Chills and fever.  Bone or joint pain at rest and with activity.  Tenderness when touching the area or bending the joint.  Refusal to bear weight on a leg or inability to use an arm due to pain.  Decreased range of motion in a joint.  Skin redness, warmth, and tenderness.  Open skin sores and drainage. RISK FACTORS Children, the elderly, and those with weak immune systems are at increased risk of bone and joint infections. It is more common in people with HIV infections and with people on chemotherapy. People  are also at increased risk if they have surgery where metal implants are used to stabilize the bone. Plates, screws, or artificial joints provide a surface that bacteria can stick on. Such a growth of bacteria is called biofilm. The biofilm protects bacteria from antibiotics and bodily defenses. This allows germs to multiply. Other reasons for increased risks include:   Having previous surgery or injury of a bone or joint.  Being on high-dose corticosteroids and immunosuppressive medications that weaken your body's resistance to germs.  Diabetes and long-standing diseases.  Use of intravenous street drugs.  Being on hemodialysis.  Having a history of urinary tract infections.  Removal of your spleen (splenectomy). This weakens your immunity.  Chronic viral infections such as HIV or AIDS.  Lack of sensation such as paraplegia, quadriplegia, or spina bifida. DIAGNOSIS   Increased numbers of white blood cells in your blood may indicate infection. Some times your caregivers are able to identify the infecting germs by testing your blood. Inflammatory markers present in your bloodstream such as an erythrocyte sedimentation rate (ESR or sed rate) or c-reactive protein (CRP) can be indicators of deep infection.  Bone scans and X-ray exams are necessary for diagnosing osteomyelitis. They may help your caregiver find the infected areas. Other studies may give more detailed information. They may help detect fluid collections around a joint, abnormal bone surfaces, or be useful in diagnosing septic arthritis. They can find soft tissue swelling and find excess fluid in an infected joint or the adjacent bone. These tests include:  Ultrasound.  CT (computerized tomography).  MRI (magnetic resonance  imaging).  The best test for diagnosing a bone or joint infection is an aspiration or biopsy. Your caregiver will usually use a local anesthetic. He or she can then remove tissue from a bone injury or use  a needle to take fluids from an infected joint. A local anesthetic medication numbs the area to be biopsied. Often biopsies are done in the operating room under general anesthesia. This means you will be asleep during the procedure. Tests performed on these samples can identify an infection. TREATMENT   Treatment can help control long-standing infections, but infections may come back.  Infections can infect any bone or joint at any age.  Bone and joint infections are rarely fatal.  Bone infection left untreated can become a never-ending infection. It can spread to other areas of your body. It may eventually cause bone death. Reduced limb or joint function can result. In severe cases, this may require removal of a limb. Spinal osteomyelitis is very dangerous. Untreated, it may damage spinal nerves and cause death.  The most common complication of septic arthritis is osteoarthritis with pain and decreased range of motion of the joint. Some forms of treatment may include:  If the infection is caused by bacteria, it is generally treated with antibiotics. You will likely receive the drugs through a vein (intravenously) for anywhere from 2 to 6 weeks. In some cases, especially with children, oral antibiotics following an initial intravenous dose may be effective. The treatment you receive depends on the:  Type of bacteria.  Location of the infection.  Type of surgery that might be done.  Other health conditions or issues you might have.  Your caregiver may drain soft tissue abscesses or pockets of fluid around infected bones or joints. If you have septic arthritis, your caregiver may use a needle to drain pus from the joint on a daily basis. He or she may use an arthroscope to clean the joint or may need to open the joint surgically to remove damaged tissue and infection. An arthroscope is an instrument like a thin lighted telescope. It can be used to look inside the joint.  Surgery is usually  needed if the infection has become long-standing. It may also be needed if there is hardware (such as metal plates, screws, or artificial joints) inside the patient. Sometimes a bone or muscle graft is needed to fill in the open space. This promotes growth of new tissues and better blood flow to the area. PREVENTION   Clean and disinfect wounds quickly to help prevent the start of a bone or joint infection. Get treatment for any infections to prevent spread to a bone or joint.  Do not smoke. Smoking decreases healing rates of bone and predisposes to infection.  When given medications that suppress your immune system, use them according to your caregiver's instructions. Do not take more than prescribed for your condition.  Take good care of your feet and skin, especially if you have diabetes, decreased sensation or circulation problems. SEEK IMMEDIATE MEDICAL CARE IF:   You cannot bear weight on a leg or use an arm, especially following a minor injury. This can be a sign of bone or joint infection.  You think you may have signs or symptoms of a bone or joint infection. Your chance of getting rid of an infection is better if treated early. Document Released: 07/20/2005 Document Revised: 10/12/2011 Document Reviewed: 06/19/2009 [facility] Patient Information 2015 [place], [place]. This information is not intended to replace advice given to you  by your health care provider. Make sure you discuss any questions you have with your health care provider.

## 2015-01-21 NOTE — ED Notes (Signed)
CBG-150. Notified RN

## 2015-01-22 ENCOUNTER — Encounter: Payer: Self-pay | Admitting: Internal Medicine

## 2015-01-22 ENCOUNTER — Telehealth: Payer: Self-pay | Admitting: Clinical

## 2015-01-22 ENCOUNTER — Other Ambulatory Visit: Payer: Self-pay | Admitting: Internal Medicine

## 2015-01-22 ENCOUNTER — Telehealth: Payer: Self-pay

## 2015-01-22 DIAGNOSIS — R600 Localized edema: Secondary | ICD-10-CM

## 2015-01-22 NOTE — Telephone Encounter (Signed)
Nurse called patient, patient verified date of birth. Patient had sent message requesting prescription refill for gabapentin. Nurse called patient and advised patient he should have refills at the pharmace. Nurse instructed patient to call pharmacy and request refill.  Patient voices understanding.

## 2015-01-22 NOTE — Telephone Encounter (Signed)
F/u w pt; pt needs refill on Gabapentin(has one left), he will come back next week for appointment w PCP, wants to know results of orange card application. [facility] will check on meds at pharmacy and orange card application results.

## 2015-01-22 NOTE — Telephone Encounter (Signed)
error 

## 2015-01-28 ENCOUNTER — Encounter: Payer: Self-pay | Admitting: Internal Medicine

## 2015-01-28 ENCOUNTER — Ambulatory Visit: Payer: Self-pay | Attending: Internal Medicine | Admitting: Internal Medicine

## 2015-01-28 VITALS — BP 123/83 | HR 96 | Temp 98.3°F | Resp 16 | Wt 212.0 lb

## 2015-01-28 DIAGNOSIS — E1165 Type 2 diabetes mellitus with hyperglycemia: Secondary | ICD-10-CM

## 2015-01-28 DIAGNOSIS — IMO0002 Reserved for concepts with insufficient information to code with codable children: Secondary | ICD-10-CM

## 2015-01-28 DIAGNOSIS — M79675 Pain in left toe(s): Secondary | ICD-10-CM

## 2015-01-28 DIAGNOSIS — M7989 Other specified soft tissue disorders: Secondary | ICD-10-CM

## 2015-01-28 DIAGNOSIS — M79672 Pain in left foot: Secondary | ICD-10-CM

## 2015-01-28 DIAGNOSIS — E1140 Type 2 diabetes mellitus with diabetic neuropathy, unspecified: Secondary | ICD-10-CM

## 2015-01-28 LAB — GLUCOSE, POCT (MANUAL RESULT ENTRY): POC Glucose: 149 mg/dl — AB (ref 70–99)

## 2015-01-28 MED ORDER — INSULIN GLARGINE 100 UNIT/ML SOLOSTAR PEN: 20.0000 [IU] | PEN_INJECTOR | Freq: Every day | SUBCUTANEOUS | Status: DC

## 2015-01-28 NOTE — Progress Notes (Signed)
  Pt is here to follow up on toe and he is requesting refill on Lantus pen

## 2015-01-28 NOTE — Patient Instructions (Signed)
I will call you once I get the results

## 2015-01-28 NOTE — Progress Notes (Signed)
Patient ID: Ryan Shaw, male   DOB: March 13, 1966, 49 y.o.   MRN: [id number]  CC: f/u  HPI: Ryan Shaw is a 49 y.o. male here today for a follow up visit.  Patient has past medical history of T2DM and neuropathy.  Patient was seen in the ED on 01/21/15 for neuropathy pain in his feet and had a foot x-ray. Patient was found to have possible chronic osteomyelitis. Patient is in need of a MRI of the left foot for further evaluation. He reports continued pain. He denies fever, chills, open skin. He is requesting a refill of his Lantus. He states that he has been unable to get his pain medication filled due to lack of money.   Patient has No headache, No chest pain, No abdominal pain - No Nausea, No new weakness tingling or numbness, No Cough - SOB.  No Known Allergies Past Medical History  Diagnosis Date  . Diabetes mellitus without complication   . Neuropathy    Current Outpatient Prescriptions on File Prior to Visit  Medication Sig Dispense Refill  . acetaminophen (TYLENOL) 500 MG tablet Take 1,000 mg by mouth at bedtime as needed (pain).    Marland Kitchen gabapentin (NEURONTIN) 300 MG capsule Take 2 capsules (600 mg total) by mouth 3 (three) times daily. 180 capsule 4  . ibuprofen (ADVIL,MOTRIN) 600 MG tablet Take 1 tablet (600 mg total) by mouth every 6 (six) hours as needed. (Patient taking differently: Take 600 mg by mouth at bedtime as needed (pain). ) 30 tablet 0  . Insulin Glargine (LANTUS SOLOSTAR) 100 UNIT/ML Solostar Pen Inject 20 Units into the skin daily at 10 pm. 30 mL 3  . metFORMIN (GLUCOPHAGE) 1000 MG tablet Take 1 tablet (1,000 mg total) by mouth 2 (two) times daily with a meal. New dose--Take 1 pill twice per day 60 tablet 4  . traMADol (ULTRAM) 50 MG tablet Take 1 tablet (50 mg total) by mouth every 8 (eight) hours as needed. (Patient taking differently: Take 50 mg by mouth every 8 (eight) hours as needed for moderate pain. ) 60 tablet 0  . terbinafine (LAMISIL) 250 MG tablet Take 1  tablet (250 mg total) by mouth daily. (Patient not taking: Reported on 01/28/2015) 30 tablet 1   No current facility-administered medications on file prior to visit.   Family History  Problem Relation Age of Onset  . Cancer Mother   . Aneurysm Father     brain   History   Social History  . Marital Status: Legally Separated    Spouse Name: N/A  . Number of Children: N/A  . Years of Education: N/A   Occupational History  . Not on file.   Social History Main Topics  . Smoking status: Current Some Day Smoker -- 0.25 packs/day    Types: Cigarettes  . Smokeless tobacco: Not on file     Comment: uses a vapor -puff-4-5 x/day  . Alcohol Use: No     Comment: patient states he quit drinking since he has been sick  . Drug Use: No  . Sexual Activity: Not on file   Other Topics Concern  . Not on file   Social History Narrative    Review of Systems: See HPI    Objective:   Filed Vitals:   01/28/15 1443  BP: 123/83  Pulse: 96  Temp: 98.3 F (36.8 C)  Resp: 16    Physical Exam  Cardiovascular: Normal rate, regular rhythm and normal heart sounds.   Pulmonary/Chest: Effort  normal and breath sounds normal.  Musculoskeletal:  Mild swelling of bilateral second toe  Skin: Skin is warm and dry.     Lab Results  Component Value Date   WBC 7.4 10/23/2014   HGB 15.6 10/23/2014   HCT 44.2 10/23/2014   MCV 81.5 10/23/2014   PLT 292 10/23/2014   Lab Results  Component Value Date   CREATININE 0.96 10/23/2014   BUN 21 10/23/2014   NA 131* 10/23/2014   K 4.5 10/23/2014   CL 96 10/23/2014   CO2 25 10/23/2014    Lab Results  Component Value Date   HGBA1C 8.9 01/03/2015   Lipid Panel     Component Value Date/Time   CHOL 153 11/12/2014 0911   TRIG 118 11/12/2014 0911   HDL 48 11/12/2014 0911   CHOLHDL 3.2 11/12/2014 0911   VLDL 24 11/12/2014 0911   LDLCALC 81 11/12/2014 0911       Assessment and plan:   [name] was seen today for follow-up.  Diagnoses and  all orders for this visit:  DM type 2, uncontrolled, with neuropathy Orders: -     Glucose (CBG) -    Refilled Insulin Glargine (LANTUS SOLOSTAR) 100 UNIT/ML Solostar Pen; Inject 20 Units into the skin daily at 10 pm. Stressed strict glycemic control. Continue regimen  Pain and swelling of toe of left foot MRI has been ordered and is scheduled for 02/06/15. He is also due to /u with podiatry soon for follow up.  Return if symptoms worsen or fail to improve.       Holland CommonsKECK, VALERIE, NP-C [facility] [phone] 01/28/2015, 3:11 PM

## 2015-02-01 ENCOUNTER — Encounter: Payer: Self-pay | Admitting: Podiatry

## 2015-02-01 ENCOUNTER — Telehealth: Payer: Self-pay

## 2015-02-01 ENCOUNTER — Ambulatory Visit: Payer: No Typology Code available for payment source | Admitting: Podiatry

## 2015-02-01 VITALS — BP 130/95 | HR 107 | Resp 18

## 2015-02-01 DIAGNOSIS — L97511 Non-pressure chronic ulcer of other part of right foot limited to breakdown of skin: Secondary | ICD-10-CM

## 2015-02-01 DIAGNOSIS — G629 Polyneuropathy, unspecified: Secondary | ICD-10-CM

## 2015-02-01 NOTE — Telephone Encounter (Signed)
Spoke with patient.

## 2015-02-01 NOTE — Progress Notes (Signed)
   Subjective:    Patient ID: Ryan Shaw, male    DOB: 03/22/1966, 49 y.o.   MRN: [id number]  HPI  49 year old male presents the office today for follow-up evaluation of neuropathy to bilateral lower chemise. Presents in the patient to be held until the cement at the clinic. He is currently on gabapentin, taking the maximum out as a discussed with his primary care physician. He continues to have pain to both of his legs. He has a states that today he no surrounding signs right big toe as he started noticing some bleeding to his toe. He hasn't started or how long his been present. Denies any surrounding redness or any swelling to the extremities. He is on lamisil by his PCP for onychomycosis. No other complaints at this time.   Review of Systems  Musculoskeletal: Positive for back pain.       JOINT PAIN   All other systems reviewed and are negative.      Objective:   Physical Exam AAO 3, NAD DP/PT pulses palpable, CRT less than 3 seconds Protective sensation absent with Dorann OuSimms Weinstein monofilament, absent vibratory sensation Achilles tendon reflex decreased Of the distal aspect of the right hallux there is a superficial granular ulceration measuring 0.7 x 0.5 cm. this appears to be from a blister type reaction. There is no probing, undermining, tunneling. There is no surrounding erythema, ascending cellulitis, flexors, crepitus, malodor, drainage. No other open lesions or pre-ulcer lesions identified bilaterally No areas of tenderness to bilateral lower extremities. MMT 5/5, ROM WNL No pain with calf compression, swelling, warmth, erythema.        Assessment & Plan:  49 year old male with significant symptomatically peripheral neuropathy; wound right hallux -Treatment options discussed including all alternatives, risks, and complications -He has been on this dosage of gabapentin loss couple weeks. Continue this dosage as fevers maximum out. Discussed possible switching to Lyrica  however given the cost will likely hold off. I did dispensed to the patient a bottle of NeuRemedy to see if this will help. Also discussed possible neurology evaluation -Antibiotic ointment and a bandage over the big toe. Continue daily dressing changes. Monitor for any clinical signs or symptoms of infection and directed to call the office immediately should any occur or go to the ER. -Follow-up 4 weeks or sooner if any problems arise. In the meantime, encouraged to call the office with any questions, concerns, change in symptoms.   Ovid CurdMatthew Wagoner, DPM

## 2015-02-01 NOTE — Telephone Encounter (Signed)
-----   Message from Ambrose FinlandValerie A Keck, NP sent at 01/22/2015  1:33 PM EDT ----- Regarding: please call and schedule MRI Please call patient and let him know that I received his ED note and it appears that he may have a bone infection so we need to do a MRI to be sure. If he does have a bone infection of his left toe then he will need to go to a infectious disease doctor for treatment, so it is important that he goes for this test. Please schedule for patient, order is placed.

## 2015-02-06 ENCOUNTER — Ambulatory Visit (HOSPITAL_COMMUNITY)
Admission: RE | Admit: 2015-02-06 | Discharge: 2015-02-06 | Disposition: A | Discharge status: Home / Self Care | Payer: Self-pay | Source: Ambulatory Visit | Attending: Internal Medicine | Admitting: Internal Medicine

## 2015-02-06 DIAGNOSIS — E1140 Type 2 diabetes mellitus with diabetic neuropathy, unspecified: Secondary | ICD-10-CM | POA: Insufficient documentation

## 2015-02-06 DIAGNOSIS — M2012 Hallux valgus (acquired), left foot: Secondary | ICD-10-CM | POA: Insufficient documentation

## 2015-02-06 DIAGNOSIS — R600 Localized edema: Secondary | ICD-10-CM

## 2015-02-06 DIAGNOSIS — M89571 Osteolysis, right ankle and foot: Secondary | ICD-10-CM | POA: Insufficient documentation

## 2015-02-06 DIAGNOSIS — R609 Edema, unspecified: Secondary | ICD-10-CM | POA: Insufficient documentation

## 2015-02-06 DIAGNOSIS — M89572 Osteolysis, left ankle and foot: Secondary | ICD-10-CM | POA: Insufficient documentation

## 2015-02-11 ENCOUNTER — Encounter: Payer: Self-pay | Admitting: Podiatry

## 2015-02-14 ENCOUNTER — Encounter (HOSPITAL_COMMUNITY): Payer: Self-pay | Admitting: Emergency Medicine

## 2015-02-14 ENCOUNTER — Emergency Department (HOSPITAL_COMMUNITY)
Admission: EM | Admit: 2015-02-14 | Discharge: 2015-02-14 | Disposition: A | Discharge status: Home / Self Care | Payer: Self-pay | Attending: Emergency Medicine | Admitting: Emergency Medicine

## 2015-02-14 ENCOUNTER — Emergency Department (HOSPITAL_COMMUNITY): Payer: Self-pay

## 2015-02-14 DIAGNOSIS — Z720 Tobacco use: Secondary | ICD-10-CM | POA: Insufficient documentation

## 2015-02-14 DIAGNOSIS — R079 Chest pain, unspecified: Secondary | ICD-10-CM | POA: Insufficient documentation

## 2015-02-14 DIAGNOSIS — Z794 Long term (current) use of insulin: Secondary | ICD-10-CM | POA: Insufficient documentation

## 2015-02-14 DIAGNOSIS — R000 Tachycardia, unspecified: Secondary | ICD-10-CM | POA: Insufficient documentation

## 2015-02-14 DIAGNOSIS — G629 Polyneuropathy, unspecified: Secondary | ICD-10-CM | POA: Insufficient documentation

## 2015-02-14 DIAGNOSIS — M1990 Unspecified osteoarthritis, unspecified site: Secondary | ICD-10-CM | POA: Insufficient documentation

## 2015-02-14 DIAGNOSIS — E119 Type 2 diabetes mellitus without complications: Secondary | ICD-10-CM | POA: Insufficient documentation

## 2015-02-14 DIAGNOSIS — Z79899 Other long term (current) drug therapy: Secondary | ICD-10-CM | POA: Insufficient documentation

## 2015-02-14 HISTORY — DX: Unspecified osteoarthritis, unspecified site: M19.90

## 2015-02-14 LAB — COMPREHENSIVE METABOLIC PANEL
ALK PHOS: 79 U/L (ref 38–126)
ALT: 22 U/L (ref 17–63)
ANION GAP: 6 (ref 5–15)
AST: 20 U/L (ref 15–41)
Albumin: 3.5 g/dL (ref 3.5–5.0)
BUN: 12 mg/dL (ref 6–20)
CHLORIDE: 106 mmol/L (ref 101–111)
CO2: 26 mmol/L (ref 22–32)
CREATININE: 1.1 mg/dL (ref 0.61–1.24)
Calcium: 9 mg/dL (ref 8.9–10.3)
Glucose, Bld: 215 mg/dL — ABNORMAL HIGH (ref 65–99)
POTASSIUM: 4.6 mmol/L (ref 3.5–5.1)
Sodium: 138 mmol/L (ref 135–145)
TOTAL PROTEIN: 6.8 g/dL (ref 6.5–8.1)
Total Bilirubin: 0.3 mg/dL (ref 0.3–1.2)

## 2015-02-14 LAB — CK: Total CK: 194 U/L (ref 49–397)

## 2015-02-14 LAB — CBC WITH DIFFERENTIAL/PLATELET
BASOS PCT: 0 % (ref 0–1)
Basophils Absolute: 0 10*3/uL (ref 0.0–0.1)
EOS PCT: 1 % (ref 0–5)
Eosinophils Absolute: 0.2 10*3/uL (ref 0.0–0.7)
HCT: 39.9 % (ref 39.0–52.0)
Hemoglobin: 13.5 g/dL (ref 13.0–17.0)
Lymphocytes Relative: 17 % (ref 12–46)
Lymphs Abs: 2 10*3/uL (ref 0.7–4.0)
MCH: 28.1 pg (ref 26.0–34.0)
MCHC: 33.8 g/dL (ref 30.0–36.0)
MCV: 83.1 fL (ref 78.0–100.0)
MONO ABS: 0.8 10*3/uL (ref 0.1–1.0)
Monocytes Relative: 7 % (ref 3–12)
NEUTROS ABS: 8.8 10*3/uL — AB (ref 1.7–7.7)
Neutrophils Relative %: 75 % (ref 43–77)
Platelets: 288 10*3/uL (ref 150–400)
RBC: 4.8 MIL/uL (ref 4.22–5.81)
RDW: 12.8 % (ref 11.5–15.5)
WBC: 11.8 10*3/uL — ABNORMAL HIGH (ref 4.0–10.5)

## 2015-02-14 LAB — TROPONIN I: Troponin I: <0.03 ng/mL (ref <0.031)

## 2015-02-14 LAB — D-DIMER, QUANTITATIVE (NOT AT ARMC): D DIMER QUANT: 0.3 ug{FEU}/mL (ref 0.00–0.48)

## 2015-02-14 MED ORDER — SODIUM CHLORIDE 0.9 % IV BOLUS (SEPSIS)
2000.0000 mL | Freq: Once | INTRAVENOUS | Status: AC
  Administered 2015-02-14: 2000 mL via INTRAVENOUS

## 2015-02-14 MED ORDER — OXYCODONE-ACETAMINOPHEN 5-325 MG PO TABS: 1.0000 | ORAL_TABLET | ORAL | Status: DC | PRN

## 2015-02-14 MED ORDER — MORPHINE SULFATE 4 MG/ML IJ SOLN
4.0000 mg | Freq: Once | INTRAMUSCULAR | Status: AC
  Administered 2015-02-14: 4 mg via INTRAVENOUS
  Filled 2015-02-14: qty 1

## 2015-02-14 MED ORDER — SODIUM CHLORIDE 0.9 % IV SOLN
INTRAVENOUS | Status: DC
  Administered 2015-02-14: 19:00:00 via INTRAVENOUS

## 2015-02-14 MED ORDER — METHOCARBAMOL 750 MG PO TABS: 750.0000 mg | ORAL_TABLET | Freq: Four times a day (QID) | ORAL | Status: DC

## 2015-02-14 MED ORDER — LORAZEPAM 2 MG/ML IJ SOLN
1.0000 mg | Freq: Once | INTRAMUSCULAR | Status: AC
  Administered 2015-02-14: 1 mg via INTRAVENOUS
  Filled 2015-02-14: qty 1

## 2015-02-14 NOTE — ED Provider Notes (Signed)
CSN: [id number]     Arrival date & time 02/14/15  1535 History   First MD Initiated Contact with Patient 02/14/15 1539     Chief Complaint  Patient presents with  . Chest Pain     (Consider location/radiation/quality/duration/timing/severity/associated sxs/prior Treatment) HPI Comments: Pain is sharp and worse with pressing on his chest--no prior h/o same--denies trauma, no leg pain or recent drug use  Patient is a 49 y.o. male presenting with chest pain. The history is provided by the patient.  Chest Pain Pain location:  L chest Pain quality: sharp   Pain radiates to:  Does not radiate Pain radiates to the back: no   Pain severity:  Moderate Onset quality:  Sudden Duration:  2 hours Timing:  Constant Progression:  Worsening Chronicity:  New Context: movement   Relieved by:  Nothing Worsened by:  Nothing tried Ineffective treatments:  None tried Associated symptoms: no fever, no syncope, not vomiting and no weakness     Past Medical History  Diagnosis Date  . Diabetes mellitus without complication   . Neuropathy   . Arthritis    History reviewed. No pertinent past surgical history. Family History  Problem Relation Age of Onset  . Cancer Mother   . Aneurysm Father     brain   History  Substance Use Topics  . Smoking status: Current Every Day Smoker -- 0.50 packs/day    Types: Cigarettes  . Smokeless tobacco: Not on file     Comment: uses a vapor -puff-4-5 x/day  . Alcohol Use: No     Comment: patient states he quit drinking since he has been sick    Review of Systems  Constitutional: Negative for fever.  Cardiovascular: Positive for chest pain. Negative for syncope.  Gastrointestinal: Negative for vomiting.  Neurological: Negative for weakness.  All other systems reviewed and are negative.     Allergies  Review of patient's allergies indicates no known allergies.  Home Medications   Prior to Admission medications   Medication Sig Start Date End  Date Taking? Authorizing Provider  acetaminophen (TYLENOL) 500 MG tablet Take 1,000 mg by mouth at bedtime as needed (pain).    Historical Provider, MD  gabapentin (NEURONTIN) 300 MG capsule Take 2 capsules (600 mg total) by mouth 3 (three) times daily. 11/01/14   Ambrose Finland, NP  ibuprofen (ADVIL,MOTRIN) 600 MG tablet Take 1 tablet (600 mg total) by mouth every 6 (six) hours as needed. Patient taking differently: Take 600 mg by mouth at bedtime as needed (pain).  11/12/14   Ambrose Finland, NP  Insulin Glargine (LANTUS SOLOSTAR) 100 UNIT/ML Solostar Pen Inject 20 Units into the skin daily at 10 pm. 01/28/15   Ambrose Finland, NP  metFORMIN (GLUCOPHAGE) 1000 MG tablet Take 1 tablet (1,000 mg total) by mouth 2 (two) times daily with a meal. New dose--Take 1 pill twice per day 11/01/14   Ambrose Finland, NP  terbinafine (LAMISIL) 250 MG tablet Take 1 tablet (250 mg total) by mouth daily. Patient not taking: Reported on 01/28/2015 01/05/15   Ambrose Finland, NP  traMADol (ULTRAM) 50 MG tablet Take 1 tablet (50 mg total) by mouth every 8 (eight) hours as needed. Patient taking differently: Take 50 mg by mouth every 8 (eight) hours as needed for moderate pain.  01/03/15   Ambrose Finland, NP   BP 120/81 mmHg  Temp(Src) 98.8 F (37.1 C) (Oral)  Resp 12  Ht  (1.981 m)  Wt 214  lb (97.07 kg)  BMI 24.74 kg/m2  SpO2 99% Physical Exam  Constitutional: He is oriented to person, place, and time. He appears well-developed and well-nourished.  Non-toxic appearance. No distress.  HENT:  Head: Normocephalic and atraumatic.  Eyes: Conjunctivae, EOM and lids are normal. Pupils are equal, round, and reactive to light.  Neck: Normal range of motion. Neck supple. No tracheal deviation present. No thyroid mass present.  Cardiovascular: Regular rhythm and normal heart sounds.  Tachycardia present.  Exam reveals no gallop.   No murmur heard. Pulmonary/Chest: Effort normal and breath sounds normal. No stridor. No  respiratory distress. He has no decreased breath sounds. He has no wheezes. He has no rhonchi. He has no rales.    Abdominal: Soft. Normal appearance and bowel sounds are normal. He exhibits no distension. There is no tenderness. There is no rebound and no CVA tenderness.  Musculoskeletal: Normal range of motion. He exhibits no edema or tenderness.       Feet:  Neurological: He is alert and oriented to person, place, and time. He has normal strength. No cranial nerve deficit or sensory deficit. GCS eye subscore is 4. GCS verbal subscore is 5. GCS motor subscore is 6.  Skin: Skin is warm and dry. No abrasion and no rash noted.  Psychiatric: He has a normal mood and affect. His speech is normal and behavior is normal.  Nursing note and vitals reviewed.   ED Course  Procedures (including critical care time) Labs Review Labs Reviewed  TROPONIN I  D-DIMER, QUANTITATIVE (NOT AT [facility])  CBC WITH DIFFERENTIAL/PLATELET  COMPREHENSIVE METABOLIC PANEL  CK    Imaging Review No results found.   EKG Interpretation   Date/Time:  Thursday February 14 2015 15:41:26 EDT Ventricular Rate:  119 PR Interval:  138 QRS Duration: 79 QT Interval:  319 QTC Calculation: 449 R Axis:   84 Text Interpretation:  Sinus tachycardia Biatrial enlargement Anteroseptal  infarct, age indeterminate No significant change since last tracing  Confirmed by Arayla Kruschke  MD, Chisom Aust ([id number]) on 02/14/2015 3:44:09 PM      MDM   Final diagnoses:  Chest pain    Patient given pain meds here feels better. D-dimer and troponin negative. Do not think that this represents ACS or PE. Chest x-ray is unremarkable. Patient's EKG unchanged from prior studies. Patient's tachycardia resolved with fluids and pain meds. Patient feels better and is stable for discharge with follow-up with his doctor concerning his toe    Lorre NickAnthony Earon Rivest, MD 02/14/15 2046

## 2015-02-14 NOTE — ED Notes (Signed)
CBG for EMS 194, BP 117/77, HR 125 sinus tach

## 2015-02-14 NOTE — Discharge Instructions (Signed)

## 2015-02-14 NOTE — ED Notes (Signed)
Pt has had chest pain for 3 days. 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 Painful to touch and painful with deep breath. Nonradiating. Pt was diaphoretic denies N/V. Denies cough. 1 nitro given and did not help. Pt has had  ASA. Pt also complaining of R foot/toe pain- swollen and discolored. Pt stubbed it yesterday.

## 2015-02-16 ENCOUNTER — Encounter (HOSPITAL_COMMUNITY): Payer: Self-pay | Admitting: Nurse Practitioner

## 2015-02-16 ENCOUNTER — Inpatient Hospital Stay (HOSPITAL_COMMUNITY)
Admission: EM | Admit: 2015-02-16 | Discharge: 2015-02-18 | DRG: 505 | Disposition: A | Discharge status: Home / Self Care | Payer: Self-pay | Attending: Internal Medicine | Admitting: Internal Medicine

## 2015-02-16 ENCOUNTER — Emergency Department (HOSPITAL_COMMUNITY): Payer: Self-pay

## 2015-02-16 DIAGNOSIS — M86171 Other acute osteomyelitis, right ankle and foot: Principal | ICD-10-CM | POA: Diagnosis present

## 2015-02-16 DIAGNOSIS — IMO0002 Reserved for concepts with insufficient information to code with codable children: Secondary | ICD-10-CM

## 2015-02-16 DIAGNOSIS — Z794 Long term (current) use of insulin: Secondary | ICD-10-CM

## 2015-02-16 DIAGNOSIS — E1165 Type 2 diabetes mellitus with hyperglycemia: Secondary | ICD-10-CM | POA: Diagnosis present

## 2015-02-16 DIAGNOSIS — E1100 Type 2 diabetes mellitus with hyperosmolarity without nonketotic hyperglycemic-hyperosmolar coma (NKHHC): Secondary | ICD-10-CM | POA: Diagnosis present

## 2015-02-16 DIAGNOSIS — F17210 Nicotine dependence, cigarettes, uncomplicated: Secondary | ICD-10-CM | POA: Diagnosis present

## 2015-02-16 DIAGNOSIS — M1990 Unspecified osteoarthritis, unspecified site: Secondary | ICD-10-CM | POA: Diagnosis present

## 2015-02-16 DIAGNOSIS — E1140 Type 2 diabetes mellitus with diabetic neuropathy, unspecified: Secondary | ICD-10-CM | POA: Diagnosis present

## 2015-02-16 DIAGNOSIS — F1290 Cannabis use, unspecified, uncomplicated: Secondary | ICD-10-CM | POA: Diagnosis present

## 2015-02-16 DIAGNOSIS — M86672 Other chronic osteomyelitis, left ankle and foot: Secondary | ICD-10-CM | POA: Diagnosis present

## 2015-02-16 DIAGNOSIS — Z79899 Other long term (current) drug therapy: Secondary | ICD-10-CM

## 2015-02-16 DIAGNOSIS — M869 Osteomyelitis, unspecified: Secondary | ICD-10-CM

## 2015-02-16 LAB — BASIC METABOLIC PANEL
Anion gap: 9 (ref 5–15)
BUN: 8 mg/dL (ref 6–20)
CHLORIDE: 100 mmol/L — AB (ref 101–111)
CO2: 27 mmol/L (ref 22–32)
Calcium: 9.2 mg/dL (ref 8.9–10.3)
Creatinine, Ser: 0.99 mg/dL (ref 0.61–1.24)
GFR calc Af Amer: >60 mL/min (ref >60)
GFR calc non Af Amer: >60 mL/min (ref >60)
GLUCOSE: 277 mg/dL — AB (ref 65–99)
POTASSIUM: 4.5 mmol/L (ref 3.5–5.1)
SODIUM: 136 mmol/L (ref 135–145)

## 2015-02-16 LAB — CBC WITH DIFFERENTIAL/PLATELET
BASOS ABS: 0 10*3/uL (ref 0.0–0.1)
Basophils Relative: 0 % (ref 0–1)
EOS PCT: 3 % (ref 0–5)
Eosinophils Absolute: 0.2 10*3/uL (ref 0.0–0.7)
HCT: 41 % (ref 39.0–52.0)
HEMOGLOBIN: 14 g/dL (ref 13.0–17.0)
Lymphocytes Relative: 20 % (ref 12–46)
Lymphs Abs: 1.7 10*3/uL (ref 0.7–4.0)
MCH: 28.3 pg (ref 26.0–34.0)
MCHC: 34.1 g/dL (ref 30.0–36.0)
MCV: 83 fL (ref 78.0–100.0)
Monocytes Absolute: 0.8 10*3/uL (ref 0.1–1.0)
Monocytes Relative: 9 % (ref 3–12)
Neutro Abs: 6 10*3/uL (ref 1.7–7.7)
Neutrophils Relative %: 68 % (ref 43–77)
PLATELETS: 263 10*3/uL (ref 150–400)
RBC: 4.94 MIL/uL (ref 4.22–5.81)
RDW: 12.6 % (ref 11.5–15.5)
WBC: 8.7 10*3/uL (ref 4.0–10.5)

## 2015-02-16 LAB — GLUCOSE, CAPILLARY: Glucose-Capillary: 298 mg/dL — ABNORMAL HIGH (ref 65–99)

## 2015-02-16 LAB — CG4 I-STAT (LACTIC ACID): LACTIC ACID, VENOUS: 1.91 mmol/L (ref 0.5–2.0)

## 2015-02-16 LAB — I-STAT CG4 LACTIC ACID, ED: LACTIC ACID, VENOUS: 1.69 mmol/L (ref 0.5–2.0)

## 2015-02-16 MED ORDER — HEPARIN SODIUM (PORCINE) 5000 UNIT/ML IJ SOLN
5000.0000 [IU] | Freq: Three times a day (TID) | INTRAMUSCULAR | Status: DC
  Administered 2015-02-16: 5000 [IU] via SUBCUTANEOUS
  Filled 2015-02-16: qty 1

## 2015-02-16 MED ORDER — VANCOMYCIN HCL IN DEXTROSE 1-5 GM/200ML-% IV SOLN
1000.0000 mg | INTRAVENOUS | Status: AC
  Administered 2015-02-16: 1000 mg via INTRAVENOUS
  Filled 2015-02-16: qty 200

## 2015-02-16 MED ORDER — VANCOMYCIN HCL IN DEXTROSE 1-5 GM/200ML-% IV SOLN
1000.0000 mg | Freq: Three times a day (TID) | INTRAVENOUS | Status: DC
  Administered 2015-02-17 – 2015-02-18 (×5): 1000 mg via INTRAVENOUS
  Filled 2015-02-16 (×6): qty 200

## 2015-02-16 MED ORDER — METFORMIN HCL 500 MG PO TABS
1000.0000 mg | ORAL_TABLET | Freq: Two times a day (BID) | ORAL | Status: DC
  Administered 2015-02-17 – 2015-02-18 (×2): 1000 mg via ORAL
  Filled 2015-02-16 (×2): qty 2

## 2015-02-16 MED ORDER — METHOCARBAMOL 750 MG PO TABS
750.0000 mg | ORAL_TABLET | Freq: Four times a day (QID) | ORAL | Status: DC
  Administered 2015-02-16 – 2015-02-18 (×8): 750 mg via ORAL
  Filled 2015-02-16 (×3): qty 1
  Filled 2015-02-16: qty 2
  Filled 2015-02-16 (×4): qty 1

## 2015-02-16 MED ORDER — INSULIN GLARGINE 100 UNIT/ML ~~LOC~~ SOLN
20.0000 [IU] | Freq: Every day | SUBCUTANEOUS | Status: DC
  Administered 2015-02-16 – 2015-02-17 (×2): 20 [IU] via SUBCUTANEOUS
  Filled 2015-02-16 (×3): qty 0.2

## 2015-02-16 MED ORDER — MORPHINE SULFATE 4 MG/ML IJ SOLN
4.0000 mg | Freq: Once | INTRAMUSCULAR | Status: AC
  Administered 2015-02-16: 4 mg via INTRAVENOUS
  Filled 2015-02-16: qty 1

## 2015-02-16 MED ORDER — OXYCODONE-ACETAMINOPHEN 5-325 MG PO TABS
1.0000 | ORAL_TABLET | ORAL | Status: DC | PRN
  Administered 2015-02-16: 1 via ORAL
  Administered 2015-02-17 – 2015-02-18 (×7): 2 via ORAL
  Filled 2015-02-16 (×2): qty 2
  Filled 2015-02-16: qty 1
  Filled 2015-02-16 (×4): qty 2

## 2015-02-16 MED ORDER — GABAPENTIN 300 MG PO CAPS
600.0000 mg | ORAL_CAPSULE | Freq: Three times a day (TID) | ORAL | Status: DC
  Administered 2015-02-16 – 2015-02-18 (×6): 600 mg via ORAL
  Filled 2015-02-16 (×6): qty 2

## 2015-02-16 MED ORDER — INSULIN ASPART 100 UNIT/ML ~~LOC~~ SOLN
0.0000 [IU] | Freq: Three times a day (TID) | SUBCUTANEOUS | Status: DC
  Administered 2015-02-17: 5 [IU] via SUBCUTANEOUS
  Administered 2015-02-18 (×2): 2 [IU] via SUBCUTANEOUS

## 2015-02-16 NOTE — Progress Notes (Signed)
ANTIBIOTIC CONSULT NOTE - INITIAL  Pharmacy Consult for Vancomycin  Indication: Osteomyelitis  No Known Allergies  Patient Measurements: Height: 6\' 6"  (198.1 cm) Weight: 212 lb (96.163 kg) IBW/kg (Calculated) : 91.4   Vital Signs: Temp: 98.5 F (36.9 C) (07/16 1415) Temp Source: Oral (07/16 1415) BP: 121/72 mmHg (07/16 1700) Pulse Rate: 103 (07/16 1700) Intake/Output from previous day:   Intake/Output from this shift:    Labs:  Recent Labs  02/14/15 1622 02/16/15 1650  WBC 11.8* 8.7  HGB 13.5 14.0  PLT 288 263  CREATININE 1.10  --    Estimated Creatinine Clearance: 106.2 mL/min (by C-G formula based on Cr of 1.1). No results for input(s): VANCOTROUGH, VANCOPEAK, VANCORANDOM, GENTTROUGH, GENTPEAK, GENTRANDOM, TOBRATROUGH, TOBRAPEAK, TOBRARND, AMIKACINPEAK, AMIKACINTROU, AMIKACIN in the last 72 hours.   Microbiology: No results found for this or any previous visit (from the past 720 hour(s)).  Medical History: Past Medical History  Diagnosis Date  . Diabetes mellitus without complication   . Neuropathy   . Arthritis    Assessment: 49 y.o male presented to ED on 02/16/15 with foot pain.  He reported he stubbed his R great and 2nd toe on Wed, 02/13/15, since then he has had increased pain and swelling to the toes, drainage from around the toe nails. Pharmacy consulted to dose IV vancomycin for osteomyelitis.  SCr 1.1, estimated CrCl ~ 100 ml/min WBC 8.7, afebrile.   Goal of Therapy:  Vancomycin trough level 15-20 mcg/ml  Plan: Vancomycin 1000 mg IV q8h Monitor renal function, clinical status daily. Vancomycin trough at steady state.     Thank you for allowing pharmacy to be part of this patients care team. Noah Delaineuth Trishelle Devora, RPh Clinical Pharmacist Pager: [phone] 02/16/2015,5:39 PM

## 2015-02-16 NOTE — ED Notes (Signed)
He reports he stubbed his R great and 2nd toe on Wednesday, since then hes had increased pain and swelling to the toes. His toenails are dry and flaking off and hes had some drainage from around the toenails.

## 2015-02-16 NOTE — ED Notes (Signed)
Gardener, DO at bedside.  

## 2015-02-16 NOTE — H&P (Signed)
[facility] History and Physical  Ryan ClockWilber Syler ZOX:[id number] DOB: 12/30/1965 DOA: 02/16/2015  Referring physician: EDP PCP: Ambrose FinlandValerie A Keck, NP   Chief Complaint: Toe pain   HPI: Ryan Shaw is a 49 y.o. male who presents to the ED with c/o right 2nd toe pain.  Patient stubbed his toe on Wed this week he says.  Has DM2 and diabetic neuropathy.  Increasing pain and swelling in 2nd toe since then.  Worse with movement and palpation.  No pain beyond site of skin changes.  No fevers, no chills, no N/V, no SOB.  No meds taken to alleviate symptoms.  Review of Systems: Systems reviewed.  As above, otherwise negative  Past Medical History  Diagnosis Date  . Diabetes mellitus without complication   . Neuropathy   . Arthritis    History reviewed. No pertinent past surgical history. Social History:  reports that he has been smoking Cigarettes.  He has been smoking about 0.50 packs per day. He does not have any smokeless tobacco history on file. He reports that he uses illicit drugs (Marijuana). He reports that he does not drink alcohol.  No Known Allergies  Family History  Problem Relation Age of Onset  . Cancer Mother   . Aneurysm Father     brain     Prior to Admission medications   Medication Sig Start Date End Date Taking? Authorizing Provider  Benfotiamine 150 MG CAPS Take 1 capsule by mouth 2 (two) times daily.   Yes Historical Provider, MD  gabapentin (NEURONTIN) 300 MG capsule Take 2 capsules (600 mg total) by mouth 3 (three) times daily. 11/01/14  Yes Ambrose FinlandValerie A Keck, NP  Insulin Glargine (LANTUS SOLOSTAR) 100 UNIT/ML Solostar Pen Inject 20 Units into the skin daily at 10 pm. 01/28/15  Yes Ambrose FinlandValerie A Keck, NP  Menthol, Topical Analgesic, (ICY HOT EX) Apply 1 application topically 4 (four) times daily as needed (FOR PAIN).   Yes Historical Provider, MD  metFORMIN (GLUCOPHAGE) 1000 MG tablet Take 1 tablet (1,000 mg total) by mouth 2 (two) times daily with a meal. New  dose--Take 1 pill twice per day 11/01/14  Yes Ambrose FinlandValerie A Keck, NP  oxyCODONE-acetaminophen (PERCOCET/ROXICET) 5-325 MG per tablet Take 1-2 tablets by mouth every 4 (four) hours as needed for severe pain. 02/14/15  Yes Lorre NickAnthony Allen, MD  terbinafine (LAMISIL) 250 MG tablet Take 1 tablet (250 mg total) by mouth daily. 01/05/15  Yes Ambrose FinlandValerie A Keck, NP  ibuprofen (ADVIL,MOTRIN) 600 MG tablet Take 1 tablet (600 mg total) by mouth every 6 (six) hours as needed. Patient taking differently: Take 600 mg by mouth at bedtime as needed (pain).  11/12/14   Ambrose FinlandValerie A Keck, NP  methocarbamol (ROBAXIN-750) 750 MG tablet Take 1 tablet (750 mg total) by mouth 4 (four) times daily. 02/14/15   Lorre NickAnthony Allen, MD   Physical Exam: Filed Vitals:   02/16/15 1817  BP: 133/80  Pulse: 103  Temp:   Resp: 16    BP 133/80 mmHg  Pulse 103  Temp(Src) 98.5 F (36.9 C) (Oral)  Resp 16  Ht 6\' 6"  (1.981 m)  Wt 96.163 kg (212 lb)  BMI 24.50 kg/m2  SpO2 98%  General Appearance:    Alert, oriented, no distress, appears stated age  Head:    Normocephalic, atraumatic  Eyes:    PERRL, EOMI, sclera non-icteric        Nose:   Nares without drainage or epistaxis. Mucosa, turbinates normal  Throat:   Moist mucous membranes.  Oropharynx without erythema or exudate.  Neck:   Supple. No carotid bruits.  No thyromegaly.  No lymphadenopathy.   Back:     No CVA tenderness, no spinal tenderness  Lungs:     Clear to auscultation bilaterally, without wheezes, rhonchi or rales  Chest wall:    No tenderness to palpitation  Heart:    Tachycardic without murmurs, gallops, rubs  Abdomen:     Soft, non-tender, nondistended, normal bowel sounds, no organomegaly  Genitalia:    deferred  Rectal:    deferred  Extremities:   No clubbing, cyanosis or edema.  Pulses:   2+ and symmetric all extremities  Skin:  No pain beyond site of wound, no SubQ air     Lymph nodes:   Cervical, supraclavicular, and axillary nodes normal  Neurologic:   CNII-XII  intact. Normal strength, sensation and reflexes      throughout    Labs on Admission:  Basic Metabolic Panel:  Recent Labs Lab 02/14/15 1622 02/16/15 1650  NA 138 136  K 4.6 4.5  CL 106 100*  CO2 26 27  GLUCOSE 215* 277*  BUN 12 8  CREATININE 1.10 0.99  CALCIUM 9.0 9.2   Liver Function Tests:  Recent Labs Lab 02/14/15 1622  AST 20  ALT 22  ALKPHOS 79  BILITOT 0.3  PROT 6.8  ALBUMIN 3.5   No results for input(s): LIPASE, AMYLASE in the last 168 hours. No results for input(s): AMMONIA in the last 168 hours. CBC:  Recent Labs Lab 02/14/15 1622 02/16/15 1650  WBC 11.8* 8.7  NEUTROABS 8.8* 6.0  HGB 13.5 14.0  HCT 39.9 41.0  MCV 83.1 83.0  PLT 288 263   Cardiac Enzymes:  Recent Labs Lab 02/14/15 1622  CKTOTAL 194  TROPONINI <0.03    BNP (last 3 results) No results for input(s): PROBNP in the last 8760 hours. CBG: No results for input(s): GLUCAP in the last 168 hours.  Radiological Exams on Admission: Dg Foot Complete Right  02/16/2015   CLINICAL DATA:  Right foot pain/swelling  EXAM: RIGHT FOOT COMPLETE - 3+ VIEW  COMPARISON:  01/21/2015  FINDINGS: No fracture or dislocation is seen.  Lucency/cortical involving the distal tuft of the second digit with overlying soft tissue swelling, worrisome for osteomyelitis.  The joint spaces are preserved.  No radiopaque foreign body is seen.  IMPRESSION: No fracture or dislocation is seen.  Lucency/cortical irregularity involving the second distal tuft with overlying soft tissue swelling, worrisome for osteomyelitis.   Electronically Signed   By: Charline Bills M.D.   On: 02/16/2015 15:15    EKG: Independently reviewed.  Assessment/Plan Principal Problem:   Acute osteomyelitis of toe of left foot Active Problems:   DM type 2, uncontrolled, with neuropathy   1. Acute osteomyelitis of toe of left foot - X ray findings are worrisome for osteo 1. Vanc per pharm consult 2. Ortho to see patient in AM 3. RN to  mark wound due to concern by patient that skin changes may have progressed a quarter inch up his toe since arrival to the ED 1. No systemic signs or symptoms 2. No sub q air 3. Pain is currently a 0/10 after pain meds 4. No pain beyond site of skin changes 5. No other reason to suspect that this represents Nec-Fash (discussed this with patient since he had made the comment about progression since arrival) 6. Does not appear progressed from the time that pictures were taken. 2. DM2 - 1. Continue  home lantus 2. Continue metformin 3. Add low dose SSI ac/hs    Code Status: Full  Family Communication: No family in room Disposition Plan: Admit to inpatient   Time spent: 70 min  GARDNER, JARED M. [facility] Pager [phone]  If 7AM-7PM, please contact the day team taking care of the patient [email or web address] Password [facility] 02/16/2015, 7:44 PM

## 2015-02-16 NOTE — ED Provider Notes (Signed)
CSN: [id number]     Arrival date & time 02/16/15  1400 History   First MD Initiated Contact with Patient 02/16/15 1604     Chief Complaint  Patient presents with  . Foot Pain     (Consider location/radiation/quality/duration/timing/severity/associated sxs/prior Treatment) HPI Comments: Patient presents to the emergency department with chief complaint of right second toe pain. Patient states that he stubbed his toe on Wednesday. States that he has diabetes and diabetic neuropathy. States that he has had increasing pain and swelling in his second toe. States that the pain is worsened with movement and with palpation. He denies any fevers, chills, nausea, or vomiting. He has not tried taking anything to alleviate his symptoms.  The history is provided by the patient. No language interpreter was used.    Past Medical History  Diagnosis Date  . Diabetes mellitus without complication   . Neuropathy   . Arthritis    History reviewed. No pertinent past surgical history. Family History  Problem Relation Age of Onset  . Cancer Mother   . Aneurysm Father     brain   History  Substance Use Topics  . Smoking status: Current Every Day Smoker -- 0.50 packs/day    Types: Cigarettes  . Smokeless tobacco: Not on file     Comment: uses a vapor -puff-4-5 x/day  . Alcohol Use: No     Comment: patient states he quit drinking since he has been sick    Review of Systems  Constitutional: Negative for fever and chills.  Respiratory: Negative for shortness of breath.   Cardiovascular: Negative for chest pain.  Gastrointestinal: Negative for nausea, vomiting, diarrhea and constipation.  Genitourinary: Negative for dysuria.  Musculoskeletal: Positive for arthralgias.  All other systems reviewed and are negative.     Allergies  Review of patient's allergies indicates no known allergies.  Home Medications   Prior to Admission medications   Medication Sig Start Date End Date Taking?  Authorizing Provider  acetaminophen (TYLENOL) 500 MG tablet Take 1,000 mg by mouth at bedtime as needed (pain).    Historical Provider, MD  Benfotiamine 150 MG CAPS Take 1 capsule by mouth 2 (two) times daily.    Historical Provider, MD  gabapentin (NEURONTIN) 300 MG capsule Take 2 capsules (600 mg total) by mouth 3 (three) times daily. 11/01/14   Ambrose FinlandValerie A Keck, NP  ibuprofen (ADVIL,MOTRIN) 600 MG tablet Take 1 tablet (600 mg total) by mouth every 6 (six) hours as needed. Patient taking differently: Take 600 mg by mouth at bedtime as needed (pain).  11/12/14   Ambrose FinlandValerie A Keck, NP  Insulin Glargine (LANTUS SOLOSTAR) 100 UNIT/ML Solostar Pen Inject 20 Units into the skin daily at 10 pm. 01/28/15   Ambrose FinlandValerie A Keck, NP  metFORMIN (GLUCOPHAGE) 1000 MG tablet Take 1 tablet (1,000 mg total) by mouth 2 (two) times daily with a meal. New dose--Take 1 pill twice per day 11/01/14   Ambrose FinlandValerie A Keck, NP  methocarbamol (ROBAXIN-750) 750 MG tablet Take 1 tablet (750 mg total) by mouth 4 (four) times daily. 02/14/15   Lorre NickAnthony Allen, MD  oxyCODONE-acetaminophen (PERCOCET/ROXICET) 5-325 MG per tablet Take 1-2 tablets by mouth every 4 (four) hours as needed for severe pain. 02/14/15   Lorre NickAnthony Allen, MD  terbinafine (LAMISIL) 250 MG tablet Take 1 tablet (250 mg total) by mouth daily. 01/05/15   Ambrose FinlandValerie A Keck, NP  traMADol (ULTRAM) 50 MG tablet Take 1 tablet (50 mg total) by mouth every 8 (eight) hours as needed.  Patient taking differently: Take 50 mg by mouth every 8 (eight) hours as needed for moderate pain.  01/03/15   Ambrose Finland, NP   BP 134/70 mmHg  Pulse 107  Temp(Src) 98.5 F (36.9 C) (Oral)  Resp 20  Ht 6\' 6"  (1.981 m)  Wt 212 lb (96.163 kg)  BMI 24.50 kg/m2  SpO2 97% Physical Exam  Constitutional: He is oriented to person, place, and time. He appears well-developed and well-nourished.  HENT:  Head: Normocephalic and atraumatic.  Eyes: Conjunctivae and EOM are normal. Pupils are equal, round, and reactive to  light. Right eye exhibits no discharge. Left eye exhibits no discharge. No scleral icterus.  Neck: Normal range of motion. Neck supple. No JVD present.  Cardiovascular: Regular rhythm and normal heart sounds.  Exam reveals no gallop and no friction rub.   No murmur heard. Tachycardic  Pulmonary/Chest: Effort normal and breath sounds normal. No respiratory distress. He has no wheezes. He has no rales. He exhibits no tenderness.  Abdominal: Soft. He exhibits no distension and no mass. There is no tenderness. There is no rebound and no guarding.  Musculoskeletal: Normal range of motion. He exhibits no edema or tenderness.  Right second toe tender to palpation, range of motion strength limited secondary to pain, soft tissue changes as pictured below  Neurological: He is alert and oriented to person, place, and time.  Skin: Skin is warm and dry.  Psychiatric: He has a normal mood and affect. His behavior is normal. Judgment and thought content normal.  Nursing note and vitals reviewed.   ED Course  Procedures (including critical care time) Labs Review Labs Reviewed  BASIC METABOLIC PANEL - Abnormal; Notable for the following:    Chloride 100 (*)    Glucose, Bld 277 (*)    All other components within normal limits  CBC WITH DIFFERENTIAL/PLATELET  I-STAT CG4 LACTIC ACID, ED  I-STAT CG4 LACTIC ACID, ED    Imaging Review Dg Chest 2 View  02/14/2015   CLINICAL DATA:  Mid chest pain began yesterday, history of diabetes, smoking history  EXAM: CHEST  2 VIEW  COMPARISON:  None.  FINDINGS: No active infiltrate or effusion is seen. Mediastinal and hilar contours are unremarkable. The heart is within normal limits in size. No bony abnormality is seen.  IMPRESSION: No active cardiopulmonary disease.   Electronically Signed   By: Dwyane Dee M.D.   On: 02/14/2015 16:46   Dg Foot Complete Right  02/16/2015   CLINICAL DATA:  Right foot pain/swelling  EXAM: RIGHT FOOT COMPLETE - 3+ VIEW  COMPARISON:   01/21/2015  FINDINGS: No fracture or dislocation is seen.  Lucency/cortical involving the distal tuft of the second digit with overlying soft tissue swelling, worrisome for osteomyelitis.  The joint spaces are preserved.  No radiopaque foreign body is seen.  IMPRESSION: No fracture or dislocation is seen.  Lucency/cortical irregularity involving the second distal tuft with overlying soft tissue swelling, worrisome for osteomyelitis.   Electronically Signed   By: Charline Bills M.D.   On: 02/16/2015 15:15     EKG Interpretation None          MDM   Final diagnoses:  Osteomyelitis    Patient with right toe pain, x-ray concerning for osteomyelitis. Patient is a diabetic and has diabetic neuropathy. Stubbed his toe 2 days ago. Has had worsening pain and swelling. Will check labs, anticipate orthopedic consultation and admission.  5:49 PM Discussed patient with Dr. Magnus Ivan, who recommends medicine admit  and abx.  Will likely be unable to see patient until tomorrow morning due to other emergent consultations.  Appreciate Dr. Julian Reil for admitting the patient.    Roxy Horseman, PA-C 02/16/15 1922  Samuel Jester, DO 02/19/15 2208

## 2015-02-16 NOTE — Consult Note (Signed)
Reason for Consult:  Right foot 2nd toe infection Referring Physician: EDP  Ryan Shaw is an 49 y.o. male.  HPI:   Very pleasant 49 yo male diabetic with diabetic neuropathy who developed right foot 2nd toe swelling, redness, pain, and blistering over the past week.  Presented to the ED today for further evaluation and treatment.  With x-ray evidence of osteomyelitis effecting the tip of the 2nd toe, ortho is consulted for recommendations.  Past Medical History  Diagnosis Date  . Diabetes mellitus without complication   . Neuropathy   . Arthritis     History reviewed. No pertinent past surgical history.  Family History  Problem Relation Age of Onset  . Cancer Mother   . Aneurysm Father     brain    Social History:  reports that he has been smoking Cigarettes.  He has been smoking about 0.50 packs per day. He does not have any smokeless tobacco history on file. He reports that he uses illicit drugs (Marijuana). He reports that he does not drink alcohol.  Allergies: No Known Allergies  Medications: I have reviewed the patient's current medications.  Results for orders placed or performed during the hospital encounter of 02/16/15 (from the past 48 hour(s))  CBC with Differential/Platelet     Status: None   Collection Time: 02/16/15  4:50 PM  Result Value Ref Range   WBC 8.7 4.0 - 10.5 K/uL   RBC 4.94 4.22 - 5.81 MIL/uL   Hemoglobin 14.0 13.0 - 17.0 g/dL   HCT 41.0 39.0 - 52.0 %   MCV 83.0 78.0 - 100.0 fL   MCH 28.3 26.0 - 34.0 pg   MCHC 34.1 30.0 - 36.0 g/dL   RDW 12.6 11.5 - 15.5 %   Platelets 263 150 - 400 K/uL   Neutrophils Relative % 68 43 - 77 %   Neutro Abs 6.0 1.7 - 7.7 K/uL   Lymphocytes Relative 20 12 - 46 %   Lymphs Abs 1.7 0.7 - 4.0 K/uL   Monocytes Relative 9 3 - 12 %   Monocytes Absolute 0.8 0.1 - 1.0 K/uL   Eosinophils Relative 3 0 - 5 %   Eosinophils Absolute 0.2 0.0 - 0.7 K/uL   Basophils Relative 0 0 - 1 %   Basophils Absolute 0.0 0.0 - 0.1 K/uL    Basic metabolic panel     Status: Abnormal   Collection Time: 02/16/15  4:50 PM  Result Value Ref Range   Sodium 136 135 - 145 mmol/L   Potassium 4.5 3.5 - 5.1 mmol/L   Chloride 100 (L) 101 - 111 mmol/L   CO2 27 22 - 32 mmol/L   Glucose, Bld 277 (H) 65 - 99 mg/dL   BUN 8 6 - 20 mg/dL   Creatinine, Ser 0.99 0.61 - 1.24 mg/dL   Calcium 9.2 8.9 - 10.3 mg/dL   GFR calc non Af Amer >60 >60 mL/min   GFR calc Af Amer >60 >60 mL/min    Comment: (NOTE) The eGFR has been calculated using the CKD EPI equation. This calculation has not been validated in all clinical situations. eGFR's persistently <60 mL/min signify possible Chronic Kidney Disease.    Anion gap 9 5 - 15  I-Stat CG4 Lactic Acid, ED     Status: None   Collection Time: 02/16/15  4:58 PM  Result Value Ref Range   Lactic Acid, Venous 1.69 0.5 - 2.0 mmol/L  CG4 I-STAT (Lactic acid)     Status: None  Collection Time: 02/16/15  8:09 PM  Result Value Ref Range   Lactic Acid, Venous 1.91 0.5 - 2.0 mmol/L  Glucose, capillary     Status: Abnormal   Collection Time: 02/16/15 11:21 PM  Result Value Ref Range   Glucose-Capillary 298 (H) 65 - 99 mg/dL   Comment 1 Notify RN     Dg Foot Complete Right  02/16/2015   CLINICAL DATA:  Right foot pain/swelling  EXAM: RIGHT FOOT COMPLETE - 3+ VIEW  COMPARISON:  01/21/2015  FINDINGS: No fracture or dislocation is seen.  Lucency/cortical involving the distal tuft of the second digit with overlying soft tissue swelling, worrisome for osteomyelitis.  The joint spaces are preserved.  No radiopaque foreign body is seen.  IMPRESSION: No fracture or dislocation is seen.  Lucency/cortical irregularity involving the second distal tuft with overlying soft tissue swelling, worrisome for osteomyelitis.   Electronically Signed   By: Julian Hy M.D.   On: 02/16/2015 15:15    Review of Systems  Constitutional: Positive for fever, chills and malaise/fatigue.  Neurological: Positive for tingling.    Blood pressure 128/76, pulse 112, temperature 98.6 F (37 C), temperature source Oral, resp. rate 16, height 6' 6"  (1.981 m), weight 95.664 kg (210 lb 14.4 oz), SpO2 100 %. Physical Exam  Constitutional: He is oriented to person, place, and time. He appears well-developed and well-nourished.  HENT:  Head: Atraumatic.  Eyes: EOM are normal. Pupils are equal, round, and reactive to light.  Neck: Normal range of motion. Neck supple.  Cardiovascular: Regular rhythm.  Tachycardia present.   Respiratory: Effort normal and breath sounds normal.  GI: Soft. Bowel sounds are normal.  Musculoskeletal:       Feet:  Neurological: He is alert and oriented to person, place, and time.  Skin: Skin is warm and dry.  Psychiatric: He has a normal mood and affect.   His right foot has a very strong DP pulse, but he has significant neuropathy   Assessment/Plan: Right foot 2nd toe infection and osteomyelitis 1)  I spoke with him in length about his right foot 2nd toe and the recommendation for a 2nd toe amputation given the oseto.  He understands this fully as well as the risks and benefits involved.  Will plan on surgery for tomorrow am 7/17.  [name] 02/16/2015, 11:37 PM

## 2015-02-17 ENCOUNTER — Inpatient Hospital Stay (HOSPITAL_COMMUNITY): Payer: Self-pay | Admitting: Anesthesiology

## 2015-02-17 ENCOUNTER — Encounter (HOSPITAL_COMMUNITY)
Admission: EM | Disposition: A | Discharge status: Home / Self Care | Payer: Self-pay | Source: Home / Self Care | Attending: Internal Medicine

## 2015-02-17 ENCOUNTER — Encounter (HOSPITAL_COMMUNITY): Payer: Self-pay | Admitting: *Deleted

## 2015-02-17 DIAGNOSIS — E1140 Type 2 diabetes mellitus with diabetic neuropathy, unspecified: Secondary | ICD-10-CM

## 2015-02-17 DIAGNOSIS — M86172 Other acute osteomyelitis, left ankle and foot: Secondary | ICD-10-CM

## 2015-02-17 DIAGNOSIS — E1165 Type 2 diabetes mellitus with hyperglycemia: Secondary | ICD-10-CM

## 2015-02-17 HISTORY — PX: AMPUTATION: SHX166

## 2015-02-17 LAB — GLUCOSE, CAPILLARY
GLUCOSE-CAPILLARY: 147 mg/dL — AB (ref 65–99)
GLUCOSE-CAPILLARY: 248 mg/dL — AB (ref 65–99)
Glucose-Capillary: 194 mg/dL — ABNORMAL HIGH (ref 65–99)
Glucose-Capillary: 251 mg/dL — ABNORMAL HIGH (ref 65–99)

## 2015-02-17 LAB — BASIC METABOLIC PANEL
Anion gap: 8 (ref 5–15)
BUN: 11 mg/dL (ref 6–20)
CALCIUM: 8.5 mg/dL — AB (ref 8.9–10.3)
CO2: 26 mmol/L (ref 22–32)
Chloride: 99 mmol/L — ABNORMAL LOW (ref 101–111)
Creatinine, Ser: 0.88 mg/dL (ref 0.61–1.24)
GFR calc non Af Amer: >60 mL/min (ref >60)
GLUCOSE: 275 mg/dL — AB (ref 65–99)
Potassium: 4 mmol/L (ref 3.5–5.1)
Sodium: 133 mmol/L — ABNORMAL LOW (ref 135–145)

## 2015-02-17 LAB — SURGICAL PCR SCREEN
MRSA, PCR: NEGATIVE
STAPHYLOCOCCUS AUREUS: POSITIVE — AB

## 2015-02-17 LAB — CBC
HCT: 37.2 % — ABNORMAL LOW (ref 39.0–52.0)
Hemoglobin: 13 g/dL (ref 13.0–17.0)
MCH: 28.6 pg (ref 26.0–34.0)
MCHC: 34.9 g/dL (ref 30.0–36.0)
MCV: 81.9 fL (ref 78.0–100.0)
PLATELETS: 254 10*3/uL (ref 150–400)
RBC: 4.54 MIL/uL (ref 4.22–5.81)
RDW: 12.6 % (ref 11.5–15.5)
WBC: 6.8 10*3/uL (ref 4.0–10.5)

## 2015-02-17 SURGERY — AMPUTATION DIGIT
Anesthesia: Choice | Site: Toe | Laterality: Right

## 2015-02-17 MED ORDER — LIDOCAINE HCL (CARDIAC) 20 MG/ML IV SOLN
INTRAVENOUS | Status: AC
  Filled 2015-02-17: qty 5

## 2015-02-17 MED ORDER — SULFAMETHOXAZOLE-TRIMETHOPRIM 800-160 MG PO TABS
1.0000 | ORAL_TABLET | Freq: Two times a day (BID) | ORAL | Status: DC
  Administered 2015-02-18: 1 via ORAL
  Filled 2015-02-17: qty 1

## 2015-02-17 MED ORDER — OXYCODONE-ACETAMINOPHEN 5-325 MG PO TABS
ORAL_TABLET | ORAL | Status: AC
  Administered 2015-02-17: 2 via ORAL
  Filled 2015-02-17: qty 2

## 2015-02-17 MED ORDER — SUCCINYLCHOLINE CHLORIDE 20 MG/ML IJ SOLN
INTRAMUSCULAR | Status: AC
  Filled 2015-02-17: qty 1

## 2015-02-17 MED ORDER — DIPHENHYDRAMINE HCL 25 MG PO CAPS
25.0000 mg | ORAL_CAPSULE | Freq: Four times a day (QID) | ORAL | Status: DC | PRN
  Administered 2015-02-17: 25 mg via ORAL
  Filled 2015-02-17: qty 1

## 2015-02-17 MED ORDER — MIDAZOLAM HCL 5 MG/5ML IJ SOLN
INTRAMUSCULAR | Status: DC | PRN
  Administered 2015-02-17: 2 mg via INTRAVENOUS

## 2015-02-17 MED ORDER — BUPIVACAINE HCL (PF) 0.25 % IJ SOLN
INTRAMUSCULAR | Status: AC
  Filled 2015-02-17: qty 30

## 2015-02-17 MED ORDER — HYDROMORPHONE HCL 1 MG/ML IJ SOLN
1.0000 mg | Freq: Once | INTRAMUSCULAR | Status: AC
  Administered 2015-02-17: 1 mg via INTRAVENOUS
  Filled 2015-02-17: qty 1

## 2015-02-17 MED ORDER — LACTATED RINGERS IV SOLN
INTRAVENOUS | Status: DC | PRN
  Administered 2015-02-17: 07:00:00 via INTRAVENOUS

## 2015-02-17 MED ORDER — 0.9 % SODIUM CHLORIDE (POUR BTL) OPTIME
TOPICAL | Status: DC | PRN
  Administered 2015-02-17: 1000 mL

## 2015-02-17 MED ORDER — HYDROMORPHONE HCL 1 MG/ML IJ SOLN
0.2500 mg | INTRAMUSCULAR | Status: DC | PRN
Start: 2015-02-17 — End: 2015-02-17
  Administered 2015-02-17 (×2): 0.5 mg via INTRAVENOUS

## 2015-02-17 MED ORDER — FENTANYL CITRATE (PF) 100 MCG/2ML IJ SOLN
INTRAMUSCULAR | Status: DC | PRN
  Administered 2015-02-17 (×2): 50 ug via INTRAVENOUS
  Administered 2015-02-17: 25 ug via INTRAVENOUS

## 2015-02-17 MED ORDER — CHLORHEXIDINE GLUCONATE CLOTH 2 % EX PADS
6.0000 | MEDICATED_PAD | Freq: Every day | CUTANEOUS | Status: DC
  Administered 2015-02-17: 6 via TOPICAL

## 2015-02-17 MED ORDER — MIDAZOLAM HCL 2 MG/2ML IJ SOLN
INTRAMUSCULAR | Status: AC
  Filled 2015-02-17: qty 2

## 2015-02-17 MED ORDER — PROPOFOL 10 MG/ML IV BOLUS
INTRAVENOUS | Status: AC
  Filled 2015-02-17: qty 20

## 2015-02-17 MED ORDER — MEPERIDINE HCL 25 MG/ML IJ SOLN: 6.2500 mg | INTRAMUSCULAR | Status: DC | PRN

## 2015-02-17 MED ORDER — BUPIVACAINE HCL (PF) 0.25 % IJ SOLN
INTRAMUSCULAR | Status: DC | PRN
  Administered 2015-02-17: 10 mL

## 2015-02-17 MED ORDER — MUPIROCIN 2 % EX OINT
1.0000 "application " | TOPICAL_OINTMENT | Freq: Two times a day (BID) | CUTANEOUS | Status: DC
  Administered 2015-02-17 – 2015-02-18 (×3): 1 via NASAL
  Filled 2015-02-17: qty 22

## 2015-02-17 MED ORDER — EPHEDRINE SULFATE 50 MG/ML IJ SOLN
INTRAMUSCULAR | Status: AC
  Filled 2015-02-17: qty 1

## 2015-02-17 MED ORDER — ONDANSETRON HCL 4 MG/2ML IJ SOLN: 4.0000 mg | Freq: Once | INTRAMUSCULAR | Status: DC | PRN

## 2015-02-17 MED ORDER — SULFAMETHOXAZOLE-TRIMETHOPRIM 800-160 MG PO TABS: 1.0000 | ORAL_TABLET | Freq: Two times a day (BID) | ORAL | Status: DC

## 2015-02-17 MED ORDER — ENOXAPARIN SODIUM 40 MG/0.4ML ~~LOC~~ SOLN
40.0000 mg | SUBCUTANEOUS | Status: DC
  Administered 2015-02-17: 40 mg via SUBCUTANEOUS
  Filled 2015-02-17: qty 0.4

## 2015-02-17 MED ORDER — PROPOFOL INFUSION 10 MG/ML OPTIME
INTRAVENOUS | Status: DC | PRN
  Administered 2015-02-17: 75 ug/kg/min via INTRAVENOUS

## 2015-02-17 MED ORDER — FENTANYL CITRATE (PF) 250 MCG/5ML IJ SOLN
INTRAMUSCULAR | Status: AC
  Filled 2015-02-17: qty 5

## 2015-02-17 MED ORDER — HYDROMORPHONE HCL 1 MG/ML IJ SOLN
INTRAMUSCULAR | Status: AC
  Filled 2015-02-17: qty 1

## 2015-02-17 MED ORDER — SODIUM CHLORIDE 0.9 % IJ SOLN
INTRAMUSCULAR | Status: AC
  Filled 2015-02-17: qty 10

## 2015-02-17 SURGICAL SUPPLY — 51 items
BANDAGE GAUZE 4  KLING STR (GAUZE/BANDAGES/DRESSINGS) IMPLANT
BLADE AVERAGE 25MMX9MM (BLADE)
BLADE AVERAGE 25X9 (BLADE) IMPLANT
BLADE MINI RND TIP GREEN BEAV (BLADE) IMPLANT
BNDG COHESIVE 1X5 TAN STRL LF (GAUZE/BANDAGES/DRESSINGS) IMPLANT
BNDG COHESIVE 4X5 TAN STRL (GAUZE/BANDAGES/DRESSINGS) ×3 IMPLANT
BNDG COHESIVE 6X5 TAN STRL LF (GAUZE/BANDAGES/DRESSINGS) IMPLANT
BNDG ESMARK 4X9 LF (GAUZE/BANDAGES/DRESSINGS) ×3 IMPLANT
BNDG GAUZE ELAST 4 BULKY (GAUZE/BANDAGES/DRESSINGS) ×3 IMPLANT
BNDG GAUZE STRTCH 6 (GAUZE/BANDAGES/DRESSINGS) IMPLANT
CORDS BIPOLAR (ELECTRODE) ×3 IMPLANT
COVER SURGICAL LIGHT HANDLE (MISCELLANEOUS) ×3 IMPLANT
CUFF TOURNIQUET SINGLE 18IN (TOURNIQUET CUFF) IMPLANT
CUFF TOURNIQUET SINGLE 24IN (TOURNIQUET CUFF) IMPLANT
CUFF TOURNIQUET SINGLE 34IN LL (TOURNIQUET CUFF) IMPLANT
DRAPE U-SHAPE 47X51 STRL (DRAPES) ×3 IMPLANT
DURAPREP 26ML APPLICATOR (WOUND CARE) ×3 IMPLANT
ELECT REM PT RETURN 9FT ADLT (ELECTROSURGICAL) ×3
ELECTRODE REM PT RTRN 9FT ADLT (ELECTROSURGICAL) ×1 IMPLANT
GAUZE SPONGE 2X2 8PLY STRL LF (GAUZE/BANDAGES/DRESSINGS) IMPLANT
GAUZE SPONGE 4X4 12PLY STRL (GAUZE/BANDAGES/DRESSINGS) IMPLANT
GAUZE XEROFORM 1X8 LF (GAUZE/BANDAGES/DRESSINGS) ×3 IMPLANT
GLOVE BIO SURGEON STRL SZ8 (GLOVE) ×3 IMPLANT
GLOVE BIOGEL PI IND STRL 8 (GLOVE) ×1 IMPLANT
GLOVE BIOGEL PI INDICATOR 8 (GLOVE) ×2
GLOVE ORTHO TXT STRL SZ7.5 (GLOVE) ×3 IMPLANT
GOWN STRL REUS W/ TWL LRG LVL3 (GOWN DISPOSABLE) ×1 IMPLANT
GOWN STRL REUS W/ TWL XL LVL3 (GOWN DISPOSABLE) ×4 IMPLANT
GOWN STRL REUS W/TWL LRG LVL3 (GOWN DISPOSABLE) ×2
GOWN STRL REUS W/TWL XL LVL3 (GOWN DISPOSABLE) ×8
KIT BASIN OR (CUSTOM PROCEDURE TRAY) ×3 IMPLANT
KIT ROOM TURNOVER OR (KITS) ×3 IMPLANT
MANIFOLD NEPTUNE II (INSTRUMENTS) ×3 IMPLANT
NEEDLE HYPO 25GX1X1/2 BEV (NEEDLE) IMPLANT
NS IRRIG 1000ML POUR BTL (IV SOLUTION) ×3 IMPLANT
PACK ORTHO EXTREMITY (CUSTOM PROCEDURE TRAY) ×3 IMPLANT
PAD ARMBOARD 7.5X6 YLW CONV (MISCELLANEOUS) ×6 IMPLANT
PAD CAST 4YDX4 CTTN HI CHSV (CAST SUPPLIES) IMPLANT
PADDING CAST COTTON 4X4 STRL (CAST SUPPLIES)
SPECIMEN JAR SMALL (MISCELLANEOUS) ×3 IMPLANT
SPONGE GAUZE 2X2 STER 10/PKG (GAUZE/BANDAGES/DRESSINGS)
SPONGE GAUZE 4X4 12PLY STER LF (GAUZE/BANDAGES/DRESSINGS) ×3 IMPLANT
SUCTION FRAZIER TIP 10 FR DISP (SUCTIONS) IMPLANT
SUT ETHILON 2 0 FS 18 (SUTURE) IMPLANT
SUT VIC AB 2-0 FS1 27 (SUTURE) IMPLANT
SYR CONTROL 10ML LL (SYRINGE) IMPLANT
TOWEL OR 17X24 6PK STRL BLUE (TOWEL DISPOSABLE) ×3 IMPLANT
TOWEL OR 17X26 10 PK STRL BLUE (TOWEL DISPOSABLE) ×3 IMPLANT
TUBE CONNECTING 12'X1/4 (SUCTIONS)
TUBE CONNECTING 12X1/4 (SUCTIONS) IMPLANT
WATER STERILE IRR 1000ML POUR (IV SOLUTION) ×3 IMPLANT

## 2015-02-17 NOTE — Progress Notes (Signed)
Orthopedic Tech Progress Note Patient Details:  Ryan Shaw 10/12/1965 [id number]  Ortho Devices Type of Ortho Device: Postop shoe/boot Ortho Device/Splint Interventions: Application   Cammer, Mickie BailJennifer Carol 02/17/2015, 11:10 AM

## 2015-02-17 NOTE — Progress Notes (Signed)
Surgical PCR screening positive for staph. Staph PCR protocol initiated.

## 2015-02-17 NOTE — Progress Notes (Signed)
[facility] PROGRESS NOTE  Ryan ClockWilber Shaw AVW:[id number] DOB: 08/18/1965 DOA: 02/16/2015  PCP: Ambrose FinlandValerie A Keck, NP  Brief HPI: 49 year old African-American male with a past medical history of diabetes on insulin, presented with pain in the right second toe. Was found to be infected. He was hospitalized and placed on antibiotics. He underwent amputation of the second toe on the morning of July 17.  Past medical history:  Past Medical History  Diagnosis Date  . Diabetes mellitus without complication   . Neuropathy   . Arthritis     Consultants: Orthopedics  Procedures: Second ray amputation, right  Antibiotics: Vancomycin 7/16 Bactrim to be initiated tomorrow  Subjective: Patient seen after his surgery. He feels well. Denies any significant pain. No nausea, vomiting.  Objective: Vital Signs  Filed Vitals:   02/17/15 0547 02/17/15 0803 02/17/15 0815 02/17/15 0830  BP: 126/84 107/69 113/65 116/77  Pulse: 94 93 86 90  Temp: 98.3 F (36.8 C) 97.8 F (36.6 C)  97.4 F (36.3 C)  TempSrc: Oral     Resp: 16 20 16 10   Height:      Weight:      SpO2: 100% 100% 100% 100%    Intake/Output Summary (Last 24 hours) at 02/17/15 0959 Last data filed at 02/17/15 0809  Gross per 24 hour  Intake    400 ml  Output    400 ml  Net      0 ml   Filed Weights   02/16/15 1415 02/16/15 2021  Weight: 96.163 kg (212 lb) 95.664 kg (210 lb 14.4 oz)    General appearance: alert, cooperative, appears stated age and no distress Resp: clear to auscultation bilaterally Cardio: regular rate and rhythm, S1, S2 normal, no murmur, click, rub or gallop GI: soft, non-tender; bowel sounds normal; no masses,  no organomegaly Extremities: Right foot is covered in dressing. Neurologic: Alert and oriented X 3, normal strength and tone. Normal symmetric reflexes. Normal coordination and gait  Lab Results:  Basic Metabolic Panel:  Recent Labs Lab 02/14/15 1622 02/16/15 1650 02/17/15 0419    NA 138 136 133*  K 4.6 4.5 4.0  CL 106 100* 99*  CO2 26 27 26   GLUCOSE 215* 277* 275*  BUN 12 8 11   CREATININE 1.10 0.99 0.88  CALCIUM 9.0 9.2 8.5*   Liver Function Tests:  Recent Labs Lab 02/14/15 1622  AST 20  ALT 22  ALKPHOS 79  BILITOT 0.3  PROT 6.8  ALBUMIN 3.5   CBC:  Recent Labs Lab 02/14/15 1622 02/16/15 1650 02/17/15 0419  WBC 11.8* 8.7 6.8  NEUTROABS 8.8* 6.0  --   HGB 13.5 14.0 13.0  HCT 39.9 41.0 37.2*  MCV 83.1 83.0 81.9  PLT 288 263 254   Cardiac Enzymes:  Recent Labs Lab 02/14/15 1622  CKTOTAL 194  TROPONINI <0.03   CBG:  Recent Labs Lab 02/16/15 2321 02/17/15 0807  GLUCAP 298* 194*    Recent Results (from the past 240 hour(s))  Surgical pcr screen     Status: Abnormal   Collection Time: 02/17/15 12:02 AM  Result Value Ref Range Status   MRSA, PCR NEGATIVE NEGATIVE Final   Staphylococcus aureus POSITIVE (A) NEGATIVE Final    Comment:        The Xpert SA Assay (FDA approved for NASAL specimens in patients over 49 years of age), is one component of a comprehensive surveillance program.  Test performance has been validated by [facility] for patients greater than or equal  to 67 year old. It is not intended to diagnose infection nor to guide or monitor treatment.       Studies/Results: Dg Foot Complete Right  02/16/2015   CLINICAL DATA:  Right foot pain/swelling  EXAM: RIGHT FOOT COMPLETE - 3+ VIEW  COMPARISON:  01/21/2015  FINDINGS: No fracture or dislocation is seen.  Lucency/cortical involving the distal tuft of the second digit with overlying soft tissue swelling, worrisome for osteomyelitis.  The joint spaces are preserved.  No radiopaque foreign body is seen.  IMPRESSION: No fracture or dislocation is seen.  Lucency/cortical irregularity involving the second distal tuft with overlying soft tissue swelling, worrisome for osteomyelitis.   Electronically Signed   By: Charline Bills M.D.   On: 02/16/2015 15:15     Medications:  Scheduled: . Chlorhexidine Gluconate Cloth  6 each Topical Daily  . enoxaparin (LOVENOX) injection  40 mg Subcutaneous Q24H  . gabapentin  600 mg Oral TID  . HYDROmorphone      . insulin aspart  0-9 Units Subcutaneous TID WC  . insulin glargine  20 Units Subcutaneous Q2200  . metFORMIN  1,000 mg Oral BID WC  . methocarbamol  750 mg Oral QID  . mupirocin ointment  1 application Nasal BID  . [START ON 02/18/2015] sulfamethoxazole-trimethoprim  1 tablet Oral Q12H  . vancomycin  1,000 mg Intravenous Q8H   Continuous:  ZOX:[id number], oxyCODONE-acetaminophen  Assessment/Plan:  Principal Problem:   Acute osteomyelitis of toe of left foot Active Problems:   DM type 2, uncontrolled, with neuropathy    Acute osteomyelitis, right second toe Status post amputation. Currently on intravenous vancomycin. Will be changed over to oral Bactrim tomorrow. Pain medications as needed.  Diabetes mellitus type II on insulin Not very well controlled. HbA1c was 8.9 in June. Continue Lantus and sliding scale coverage. We will need to further adjust dose of Lantus. Monitor CBGs. Also on metformin, which is being continued.  DVT Prophylaxis: Lovenox    Code Status: Full code  Family Communication: Discussed with the patient  Disposition Plan: Anticipate discharge tomorrow.  Follow-up Appointment?: with orthopedics and PCP   LOS: 1 day   [facility]  [facility] Pager [phone] 02/17/2015, 9:59 AM  If 7PM-7AM, please contact night-coverage at [email or web address], password [facility]

## 2015-02-17 NOTE — Op Note (Signed)
NAME:  [name], Ryan              ACCOUNT NO.:  [id number]  MEDICAL RECORD NO.:  [id number]  LOCATION:  [facility]                         FACILITY:  [facility]  PHYSICIAN:  Vanita PandaChristopher Y. Magnus IvanBlackman, M.D.DATE OF BIRTH:  1966-01-10  DATE OF PROCEDURE:  02/16/2015 DATE OF DISCHARGE:                              OPERATIVE REPORT   PREOPERATIVE DIAGNOSIS:  Right foot second toe infection with osteomyelitis.  POSTOPERATIVE DIAGNOSES: 1. Right foot second toe infection with osteomyelitis. 2. Right 2nd toe amputation through metatarsophalangeal joint (MTP     joint).  SURGEON:  Vanita PandaChristopher Y. Magnus IvanBlackman, M.D.  ANESTHESIA: 1. Mask ventilation IV sedation. 2. Local with 0.25% plain Marcaine.  BLOOD LOSS:  Minimal.  COMPLICATIONS:  None.  INDICATIONS:  [name] is a 49 year old diabetic with peripheral neuropathy.  He developed a wound on the end of the second toe on the right foot about a week ago.  This then over the last 24 hours developed significant swelling.  There was obviously gross purulence from the end of the toe and x-ray evidence of osteomyelitis.  He has good bounding pulse in his foot and we recommended a 2nd toe amputation down to the MTP joint.  He understands this full as well as the risks and benefits of surgery and does wish to proceed given the infection.  DESCRIPTION OF PROCEDURE:  After informed consent was obtained, appropriate right foot second toe was marked.  He was brought to the operating room, placed supine on the operating table.  His right foot was prepped and draped with DuraPrep and sterile drape.  Mask ventilation IV sedation was obtained, the time-out was called to identify correct patient, correct right foot second toe.  I then used 0.25% plain Marcaine as a digital block around the second toe and then was able to use a #10 blade and excised the toe and resected/amputated through the metatarsophalangeal joint.  There was gross purulence encountered all  the end of the toe.  Approximately, though there was no evidence of infection.  We then thoroughly irrigated the wound with normal saline solution.  I used an Esmarch temporarily around the ankles for local hemostasis and this was easily obtained with electrocautery. Once I irrigated the wound thoroughly, I reapproximated the skin with interrupted 2-0 nylon suture and one 3-0 nylon suture.  Well-padded sterile dressing was applied, and he was taken to recovery room in stable condition.  All final counts were correct.  There were no complications noted.     Vanita Pandahristopher Y. Magnus IvanBlackman, M.D.     CYB/MEDQ  D:  02/17/2015  T:  02/17/2015  Job:  [id number]

## 2015-02-17 NOTE — Brief Op Note (Signed)
02/16/2015 - 02/17/2015  8:02 AM  PATIENT:  Ryan Shaw  49 y.o. male  PRE-OPERATIVE DIAGNOSIS:  osteomyelitis right second toe  POST-OPERATIVE DIAGNOSIS:  osteomyelitis right second toe  PROCEDURE:  Procedure(s): SECOND RAY AMPUTATION (Right)  SURGEON:  Surgeon(s) and Role:    * Kathryne Hitchhristopher Y Glorine Hanratty, MD - Primary  ANESTHESIA:   local and IV sedation  EBL:    minimal  LOCAL MEDICATIONS USED:  MARCAINE     SPECIMEN:  No Specimen  DISPOSITION OF SPECIMEN:  N/A  COUNTS:  YES  TOURNIQUET:    DICTATION: .Other Dictation: Dictation Number [id number]  PLAN OF CARE: Admit to inpatient   PATIENT DISPOSITION:  PACU - hemodynamically stable.   Delay start of Pharmacological VTE agent (>24hrs) due to surgical blood loss or risk of bleeding: not applicable

## 2015-02-17 NOTE — Progress Notes (Signed)
Patient ID: [name], male   DOB: 03/10/1966, 49 y.o.   MRN: [id number] Mr. Ryan Shaw did well with surgery.  His right 2nd toe was removed.  He should continue IV antibiotics today, then can go home tomorrow on oral bactrim DS for 2 weeks.

## 2015-02-17 NOTE — Anesthesia Postprocedure Evaluation (Signed)
Anesthesia Post Note  Patient: [name]  Procedure(s) Performed: Procedure(s) (LRB): SECOND RAY AMPUTATION (Right)  Anesthesia type: MAC  Patient location: PACU  Post pain: Pain level controlled  Post assessment: Patient's Cardiovascular Status Stable  Last Vitals:  Filed Vitals:   02/17/15 0845  BP: 119/74  Pulse: 86  Temp: 36.2 C  Resp: 16    Post vital signs: Reviewed and stable  Level of consciousness: sedated  Complications: No apparent anesthesia complications

## 2015-02-17 NOTE — Anesthesia Preprocedure Evaluation (Addendum)
Anesthesia Evaluation  Patient identified by MRN, date of birth, ID band Patient awake    Reviewed: Allergy & Precautions, NPO status , Patient's Chart, lab work & pertinent test results  Airway Mallampati: I  TM Distance: >3 FB Neck ROM: Full    Dental  (+) Loose, Dental Advisory Given,    Pulmonary Current Smoker,    Pulmonary exam normal       Cardiovascular Normal cardiovascular exam    Neuro/Psych    GI/Hepatic   Endo/Other  diabetes (lantus 20 units last night around 2300 (Full dose)), Type 2, Insulin Dependent, Oral Hypoglycemic Agents  Renal/GU      Musculoskeletal   Abdominal   Peds  Hematology   Anesthesia Other Findings   Reproductive/Obstetrics                            Anesthesia Physical Anesthesia Plan  ASA: II  Anesthesia Plan: MAC   Post-op Pain Management:    Induction: Intravenous  Airway Management Planned: Natural Airway  Additional Equipment:   Intra-op Plan:   Post-operative Plan: Extubation in OR  Informed Consent: I have reviewed the patients History and Physical, chart, labs and discussed the procedure including the risks, benefits and alternatives for the proposed anesthesia with the patient or authorized representative who has indicated his/her understanding and acceptance.     Plan Discussed with: CRNA and Surgeon  Anesthesia Plan Comments:         Anesthesia Quick Evaluation

## 2015-02-17 NOTE — Transfer of Care (Signed)
Immediate Anesthesia Transfer of Care Note  Patient: Ryan Shaw  Procedure(s) Performed: Procedure(s): SECOND RAY AMPUTATION (Right)  Patient Location: PACU  Anesthesia Type:MAC  Level of Consciousness: awake, alert  and oriented  Airway & Oxygen Therapy: Patient Spontanous Breathing  Post-op Assessment: Report given to RN, Post -op Vital signs reviewed and stable and Patient moving all extremities  Post vital signs: Reviewed and stable  Last Vitals:  Filed Vitals:   02/17/15 0547  BP: 126/84  Pulse: 94  Temp: 36.8 C  Resp: 16    Complications: No apparent anesthesia complications

## 2015-02-17 NOTE — Progress Notes (Signed)
Utilization review completed.  

## 2015-02-17 NOTE — Discharge Instructions (Signed)
You may put your weight on your right foot in the special post-op shoe. Keep your foot dressing clean and dry.

## 2015-02-18 ENCOUNTER — Encounter (HOSPITAL_COMMUNITY): Payer: Self-pay | Admitting: Orthopaedic Surgery

## 2015-02-18 LAB — BASIC METABOLIC PANEL
ANION GAP: 7 (ref 5–15)
BUN: 10 mg/dL (ref 6–20)
CO2: 28 mmol/L (ref 22–32)
Calcium: 8.7 mg/dL — ABNORMAL LOW (ref 8.9–10.3)
Chloride: 102 mmol/L (ref 101–111)
Creatinine, Ser: 1.03 mg/dL (ref 0.61–1.24)
GFR calc Af Amer: >60 mL/min (ref >60)
GFR calc non Af Amer: >60 mL/min (ref >60)
Glucose, Bld: 259 mg/dL — ABNORMAL HIGH (ref 65–99)
Potassium: 4.1 mmol/L (ref 3.5–5.1)
Sodium: 137 mmol/L (ref 135–145)

## 2015-02-18 LAB — CBC
HEMATOCRIT: 36.3 % — AB (ref 39.0–52.0)
Hemoglobin: 12.1 g/dL — ABNORMAL LOW (ref 13.0–17.0)
MCH: 27.4 pg (ref 26.0–34.0)
MCHC: 33.3 g/dL (ref 30.0–36.0)
MCV: 82.3 fL (ref 78.0–100.0)
Platelets: 270 10*3/uL (ref 150–400)
RBC: 4.41 MIL/uL (ref 4.22–5.81)
RDW: 12.6 % (ref 11.5–15.5)
WBC: 6.2 10*3/uL (ref 4.0–10.5)

## 2015-02-18 LAB — GLUCOSE, CAPILLARY
GLUCOSE-CAPILLARY: 199 mg/dL — AB (ref 65–99)
GLUCOSE-CAPILLARY: 176 mg/dL — AB (ref 65–99)

## 2015-02-18 MED ORDER — SULFAMETHOXAZOLE-TRIMETHOPRIM 800-160 MG PO TABS: 1.0000 | ORAL_TABLET | Freq: Two times a day (BID) | ORAL | Status: DC

## 2015-02-18 MED ORDER — OXYCODONE-ACETAMINOPHEN 5-325 MG PO TABS: 1.0000 | ORAL_TABLET | ORAL | Status: DC | PRN

## 2015-02-18 MED ORDER — INSULIN GLARGINE 100 UNIT/ML SOLOSTAR PEN: 24.0000 [IU] | PEN_INJECTOR | Freq: Every day | SUBCUTANEOUS | Status: DC

## 2015-02-18 NOTE — Discharge Summary (Signed)
[facility]  Physician Discharge Summary   Patient ID: Ryan Shaw MRN: [id number] DOB/AGE: September 28, 1965 49 y.o.  Admit date: 02/16/2015 Discharge date: 02/18/2015  PCP: Ryan Finland, NP  DISCHARGE DIAGNOSES:  Principal Problem:   Acute osteomyelitis of toe of left foot Active Problems:   DM type 2, uncontrolled, with neuropathy   RECOMMENDATIONS FOR OUTPATIENT FOLLOW UP: 1. Insulin dose has been increased 2. Patient to follow-up with orthopedics   DISCHARGE CONDITION: fair  Diet recommendation: Modified carbohydrate  Filed Weights   02/16/15 1415 02/16/15 2021  Weight: 96.163 kg (212 lb) 95.664 kg (210 lb 14.4 oz)    INITIAL HISTORY: 49 year old African-American male with a past medical history of diabetes on insulin, presented with pain in the right second toe. Was found to be infected. He was hospitalized and placed on antibiotics. He underwent amputation of the second toe on the morning of July 17.  Consultants: Orthopedics, Dr. Magnus Ivan  Procedures: Second ray amputation, right  HOSPITAL COURSE:   Acute osteomyelitis, right second toe Patient was admitted to the hospital and started on intravenous vancomycin. He was seen by orthopedic surgeon. He was taken to the OR for amputation of the right second toe. He tolerated the surgery well. Pain is under good control. Changed over to oral Bactrim this morning. Will be given a prescription for 2 weeks of antibiotics. He will have follow-up with his orthopedic surgeon as an outpatient. Was also given prescriptions for narcotic pain medications.   Diabetes mellitus type II on insulin Not very well controlled. HbA1c was 8.9 in June. Continued on Lantus and placed on sliding scale coverage in the hospital. Dose of Lantus has been increased by 4 units. He will follow-up with his primary care physician for further management of the same. Continue metformin.   Patient is stable for discharge.  PERTINENT  LABS:  The results of significant diagnostics from this hospitalization (including imaging, microbiology, ancillary and laboratory) are listed below for reference.    Microbiology: Recent Results (from the past 240 hour(s))  Surgical pcr screen     Status: Abnormal   Collection Time: 02/17/15 12:02 AM  Result Value Ref Range Status   MRSA, PCR NEGATIVE NEGATIVE Final   Staphylococcus aureus POSITIVE (A) NEGATIVE Final    Comment:        The Xpert SA Assay (FDA approved for NASAL specimens in patients over 35 years of age), is one component of a comprehensive surveillance program.  Test performance has been validated by [facility] for patients greater than or equal to 43 year old. It is not intended to diagnose infection nor to guide or monitor treatment.      Labs: Basic Metabolic Panel:  Recent Labs Lab 02/14/15 1622 02/16/15 1650 02/17/15 0419 02/18/15 0431  NA 138 136 133* 137  K 4.6 4.5 4.0 4.1  CL 106 100* 99* 102  CO2 GLUCOSE 215* 277* 275* 259*  BUN CREATININE 1.10 0.99 0.88 1.03  CALCIUM 9.0 9.2 8.5* 8.7*   Liver Function Tests:  Recent Labs Lab 02/14/15 1622  AST 20  ALT 22  ALKPHOS 79  BILITOT 0.3  PROT 6.8  ALBUMIN 3.5   CBC:  Recent Labs Lab 02/14/15 1622 02/16/15 1650 02/17/15 0419 02/18/15 0431  WBC 11.8* 8.7 6.8 6.2  NEUTROABS 8.8* 6.0  --   --   HGB 13.5 14.0 13.0 12.1*  HCT 39.9 41.0 37.2* 36.3*  MCV 83.1 83.0 81.9  82.3  PLT 288 263 254 270   Cardiac Enzymes:  Recent Labs Lab 02/14/15 1622  CKTOTAL 194  TROPONINI <0.03   CBG:  Recent Labs Lab 02/17/15 1209 02/17/15 1653 02/17/15 2226 02/18/15 0731 02/18/15 1147  GLUCAP 251* 147* 248* 199* 176*     IMAGING STUDIES Dg Chest 2 View  02/14/2015   CLINICAL DATA:  Mid chest pain began yesterday, history of diabetes, smoking history  EXAM: CHEST  2 VIEW  COMPARISON:  None.  FINDINGS: No active infiltrate or effusion is seen. Mediastinal  and hilar contours are unremarkable. The heart is within normal limits in size. No bony abnormality is seen.  IMPRESSION: No active cardiopulmonary disease.   Electronically Signed   By: Dwyane DeePaul  Barry M.D.   On: 02/14/2015 16:46   Dg Foot Complete Right  02/16/2015   CLINICAL DATA:  Right foot pain/swelling  EXAM: RIGHT FOOT COMPLETE - 3+ VIEW  COMPARISON:  01/21/2015  FINDINGS: No fracture or dislocation is seen.  Lucency/cortical involving the distal tuft of the second digit with overlying soft tissue swelling, worrisome for osteomyelitis.  The joint spaces are preserved.  No radiopaque foreign body is seen.  IMPRESSION: No fracture or dislocation is seen.  Lucency/cortical irregularity involving the second distal tuft with overlying soft tissue swelling, worrisome for osteomyelitis.   Electronically Signed   By: Charline BillsSriyesh  Kurtiss Wence M.D.   On: 02/16/2015 15:15    DISCHARGE EXAMINATION: Filed Vitals:   02/17/15 1345 02/17/15 2228 02/18/15 0224 02/18/15 0601  BP: 118/78 121/70 111/73 120/73  Pulse: 84 90 94 85  Temp: 98.6 F (37 C) 98.4 F (36.9 C) 98.7 F (37.1 C) 98.4 F (36.9 C)  TempSrc: Oral Oral Oral Oral  Resp: 17 16 16 16   Height:      Weight:      SpO2: 100% 100% 99% 96%   General appearance: alert, cooperative, appears stated age and no distress Cardio: regular rate and rhythm, S1, S2 normal, no murmur, click, rub or gallop GI: soft, non-tender; bowel sounds normal; no masses,  no organomegaly Extremities: Right foot is covered in dressing.   DISPOSITION: Home. Rolling walker has been provided.  Discharge Instructions    Call MD for:  extreme fatigue    Complete by:  As directed      Call MD for:  persistant dizziness or light-headedness    Complete by:  As directed      Call MD for:  persistant nausea and vomiting    Complete by:  As directed      Call MD for:  redness, tenderness, or signs of infection (pain, swelling, redness, odor or green/yellow discharge around  incision site)    Complete by:  As directed      Call MD for:  severe uncontrolled pain    Complete by:  As directed      Call MD for:  temperature >100.4    Complete by:  As directed      Diet Carb Modified    Complete by:  As directed      Discharge instructions    Complete by:  As directed   Take your medications as prescribed. Follow up with providers as recommended.  You were cared for by a hospitalist during your hospital stay. If you have any questions about your discharge medications or the care you received while you were in the hospital after you are discharged, you can call the unit and asked to speak with the hospitalist on  call if the hospitalist that took care of you is not available. Once you are discharged, your primary care physician will handle any further medical issues. Please note that NO REFILLS for any discharge medications will be authorized once you are discharged, as it is imperative that you return to your primary care physician (or establish a relationship with a primary care physician if you do not have one) for your aftercare needs so that they can reassess your need for medications and monitor your lab values. If you do not have a primary care physician, you can call [phone] for a physician referral.     Increase activity slowly    Complete by:  As directed            ALLERGIES: No Known Allergies   Current Discharge Medication List    START taking these medications   Details  sulfamethoxazole-trimethoprim (BACTRIM DS,SEPTRA DS) 800-160 MG per tablet Take 1 tablet by mouth every 12 (twelve) hours. For 2 weeks. Qty: 28 tablet, Refills: 0      CONTINUE these medications which have CHANGED   Details  Insulin Glargine (LANTUS SOLOSTAR) 100 UNIT/ML Solostar Pen Inject 24 Units into the skin daily at 10 pm. Qty: 30 mL, Refills: 3   Associated Diagnoses: DM type 2, uncontrolled, with neuropathy    oxyCODONE-acetaminophen (PERCOCET/ROXICET) 5-325 MG per  tablet Take 1-2 tablets by mouth every 4 (four) hours as needed for severe pain. Qty: 15 tablet, Refills: 0      CONTINUE these medications which have NOT CHANGED   Details  gabapentin (NEURONTIN) 300 MG capsule Take 2 capsules (600 mg total) by mouth 3 (three) times daily. Qty: 180 capsule, Refills: 4   Associated Diagnoses: Diabetic peripheral neuropathy    Menthol, Topical Analgesic, (ICY HOT EX) Apply 1 application topically 4 (four) times daily as needed (FOR PAIN).    metFORMIN (GLUCOPHAGE) 1000 MG tablet Take 1 tablet (1,000 mg total) by mouth 2 (two) times daily with a meal. New dose--Take 1 pill twice per day Qty: 60 tablet, Refills: 4   Associated Diagnoses: DM type 2, uncontrolled, with neuropathy    terbinafine (LAMISIL) 250 MG tablet Take 1 tablet (250 mg total) by mouth daily. Qty: 30 tablet, Refills: 1   Associated Diagnoses: Onychomycosis    ibuprofen (ADVIL,MOTRIN) 600 MG tablet Take 1 tablet (600 mg total) by mouth every 6 (six) hours as needed. Qty: 30 tablet, Refills: 0    methocarbamol (ROBAXIN-750) 750 MG tablet Take 1 tablet (750 mg total) by mouth 4 (four) times daily. Qty: 30 tablet, Refills: 0      STOP taking these medications     Benfotiamine 150 MG CAPS        Follow-up Information    Follow up with Kathryne Hitch, MD. Schedule an appointment as soon as possible for a visit in 1 week.   Specialty:  Orthopedic Surgery   Contact information:   [address] [place] [address] [place] [address] [phone]       Follow up with Ryan Finland, NP. Schedule an appointment as soon as possible for a visit in 3 weeks.   Specialty:  Internal Medicine   Why:  diabetes follow up   Contact information:   [address] [place] [address] [phone]       TOTAL DISCHARGE TIME: 35 minutes  [facility]  [facility] Pager [phone]  02/18/2015, 3:33 PM

## 2015-02-18 NOTE — Progress Notes (Signed)
Pt discharged to home.  Discharge instructions explained to pt.  Pt has no questions at the time of discharge.  Pt states he has all belongings.  IV dc'd.  Pt taken out in wheelchair by NT.

## 2015-02-18 NOTE — Care Management Note (Signed)
Case Management Note  Patient Details  Name: Ryan Shaw MRN: [id number] Date of Birth: 05/31/1966  Subjective/Objective:                    Action/Plan: MATCH letter given and explained , 14 day exception entered for Percocet . Patient voiced understanding   Expected Discharge Date:                  Expected Discharge Plan:  Home/Self Care  In-House Referral:     Discharge planning Services  CM Consult, MATCH Program, Medication Assistance  Post Acute Care Choice:  Durable Medical Equipment Choice offered to:     DME Arranged:  Walker rolling DME Agency:  [facility]  HH Arranged:    HH Agency:     Status of Service:  Completed, signed off  Medicare Important Message Given:    Date Medicare IM Given:    Medicare IM give by:    Date Additional Medicare IM Given:    Additional Medicare Important Message give by:     If discussed at Long Length of Stay Meetings, dates discussed:    Additional Comments:  Ryan Shaw, Ryan Alfred Marie, RN 02/18/2015, 12:00 PM

## 2015-02-18 NOTE — Progress Notes (Signed)
Patient ID: Ryan ClockWilber Shaw, male   DOB: 04/25/1966, 49 y.o.   MRN: [id number] Very comfortable.  Tolerated surgery yesterday - right foot 2nd toe amputation.  Can put full weight on his right foot in post-op shoe.  Can be discharged to home today on oral antibiotics for 2 weeks - bactrim DS or doxycycline.

## 2015-02-26 ENCOUNTER — Ambulatory Visit: Payer: Self-pay | Attending: Internal Medicine | Admitting: Internal Medicine

## 2015-02-26 ENCOUNTER — Ambulatory Visit: Payer: Self-pay

## 2015-02-26 ENCOUNTER — Encounter: Payer: Self-pay | Admitting: Internal Medicine

## 2015-02-26 VITALS — BP 106/72 | HR 106 | Temp 98.3°F | Resp 18 | Ht 78.0 in | Wt 206.0 lb

## 2015-02-26 DIAGNOSIS — E1165 Type 2 diabetes mellitus with hyperglycemia: Secondary | ICD-10-CM

## 2015-02-26 DIAGNOSIS — E1142 Type 2 diabetes mellitus with diabetic polyneuropathy: Secondary | ICD-10-CM

## 2015-02-26 DIAGNOSIS — Z89421 Acquired absence of other right toe(s): Secondary | ICD-10-CM

## 2015-02-26 DIAGNOSIS — E1140 Type 2 diabetes mellitus with diabetic neuropathy, unspecified: Secondary | ICD-10-CM

## 2015-02-26 DIAGNOSIS — IMO0002 Reserved for concepts with insufficient information to code with codable children: Secondary | ICD-10-CM

## 2015-02-26 LAB — GLUCOSE, POCT (MANUAL RESULT ENTRY): POC Glucose: 279 mg/dl — AB (ref 70–99)

## 2015-02-26 MED ORDER — PREGABALIN 50 MG PO CAPS: 50.0000 mg | ORAL_CAPSULE | Freq: Three times a day (TID) | ORAL | Status: DC

## 2015-02-26 MED ORDER — METFORMIN HCL 1000 MG PO TABS: 1000.0000 mg | ORAL_TABLET | Freq: Two times a day (BID) | ORAL | Status: DC

## 2015-02-26 MED ORDER — INSULIN GLARGINE 100 UNIT/ML SOLOSTAR PEN: 24.0000 [IU] | PEN_INJECTOR | Freq: Every day | SUBCUTANEOUS | Status: DC

## 2015-02-26 NOTE — Patient Instructions (Signed)
We will switch to Lyrica once you get approved on pass program

## 2015-02-26 NOTE — Progress Notes (Signed)
Patient ID: Ryan Shaw, male   DOB: [birth year], 49 y.o.   MRN: [id number]  CC: HFU  HPI: Ryan Shaw is a 49 y.o. male here today for a follow up visit.  Patient has past medical history of T2DM, neuropathy, and arthritis. Patient was recently hospitalized on 7/16 for osteomyelitis of his 2nd toe of right foot. He was treated with antibiotics and underwent amputation of second toe on 02/17/15. He was discharge with PO Bactrim to take for 2 weeks and Percocets for pain. He is due to follow up with Orthopedics next week for stitch removal. Patient's insulin was increased to 24 units of Latus nightly. He currently is taking 20 units of Lantus because he was unaware of dose change. He reports that his fasting blood sugars are 150-160.    Patient has No headache, No chest pain, No abdominal pain - No Nausea, No new weakness tingling or numbness, No Cough - SOB.  No Known Allergies Past Medical History  Diagnosis Date  . Diabetes mellitus without complication   . Neuropathy   . Arthritis    Current Outpatient Prescriptions on File Prior to Visit  Medication Sig Dispense Refill  . gabapentin (NEURONTIN) 300 MG capsule Take 2 capsules (600 mg total) by mouth 3 (three) times daily. 180 capsule 4  . ibuprofen (ADVIL,MOTRIN) 600 MG tablet Take 1 tablet (600 mg total) by mouth every 6 (six) hours as needed. (Patient taking differently: Take 600 mg by mouth at bedtime as needed (pain). ) 30 tablet 0  . Insulin Glargine (LANTUS SOLOSTAR) 100 UNIT/ML Solostar Pen Inject 24 Units into the skin daily at 10 pm. 30 mL 3  . Menthol, Topical Analgesic, (ICY HOT EX) Apply 1 application topically 4 (four) times daily as needed (FOR PAIN).    Marland Kitchen metFORMIN (GLUCOPHAGE) 1000 MG tablet Take 1 tablet (1,000 mg total) by mouth 2 (two) times daily with a meal. New dose--Take 1 pill twice per day 60 tablet 4  . methocarbamol (ROBAXIN-750) 750 MG tablet Take 1 tablet (750 mg total) by mouth 4 (four) times daily. 30 tablet  0  . oxyCODONE-acetaminophen (PERCOCET/ROXICET) 5-325 MG per tablet Take 1-2 tablets by mouth every 4 (four) hours as needed for severe pain. 15 tablet 0  . terbinafine (LAMISIL) 250 MG tablet Take 1 tablet (250 mg total) by mouth daily. 30 tablet 1  . sulfamethoxazole-trimethoprim (BACTRIM DS,SEPTRA DS) 800-160 MG per tablet Take 1 tablet by mouth every 12 (twelve) hours. For 2 weeks. (Patient not taking: Reported on 02/26/2015) 28 tablet 0   No current facility-administered medications on file prior to visit.   Family History  Problem Relation Age of Onset  . Cancer Mother   . Aneurysm Father     brain   History   Social History  . Marital Status: Legally Separated    Spouse Name: N/A  . Number of Children: N/A  . Years of Education: N/A   Occupational History  . Not on file.   Social History Main Topics  . Smoking status: Current Every Day Smoker -- 0.25 packs/day    Types: Cigarettes  . Smokeless tobacco: Not on file     Comment: uses a vapor -puff-4-5 x/day  . Alcohol Use: No     Comment: patient states he quit drinking since he has been sick  . Drug Use: Yes    Special: Marijuana  . Sexual Activity: Not on file   Other Topics Concern  . Not on file   Social  History Narrative    Review of Systems: See HPI   Objective:   Filed Vitals:   02/26/15 1536  BP: 106/72  Pulse: 106  Temp: 98.3 F (36.8 C)  Resp: 18    Physical Exam  Cardiovascular: Normal rate, regular rhythm and normal heart sounds.   Pulmonary/Chest: Effort normal and breath sounds normal.  Musculoskeletal:       Right foot: There is deformity (amputated second toe).  Feet:  Right Foot:  Skin Integrity: Positive for dry skin.  Left Foot:  Skin Integrity: Negative for dry skin.  Neurological: He is alert.  Skin: Skin is warm and dry.  Wound healing of amputation site   Psychiatric: He has a normal mood and affect.    Lab Results  Component Value Date   WBC 6.2 02/18/2015   HGB  12.1* 02/18/2015   HCT 36.3* 02/18/2015   MCV 82.3 02/18/2015   PLT 270 02/18/2015   Lab Results  Component Value Date   CREATININE 1.03 02/18/2015   BUN 10 02/18/2015   NA 137 02/18/2015   K 4.1 02/18/2015   CL 102 02/18/2015   CO2 28 02/18/2015    Lab Results  Component Value Date   HGBA1C 8.9 01/03/2015   Lipid Panel     Component Value Date/Time   CHOL 153 11/12/2014 0911   TRIG 118 11/12/2014 0911   HDL 48 11/12/2014 0911   CHOLHDL 3.2 11/12/2014 0911   VLDL 24 11/12/2014 0911   LDLCALC 81 11/12/2014 0911       Assessment and plan:   Ryan Shaw was seen today for hospitalization follow-up.  Diagnoses and all orders for this visit:  S/P amputation of lesser toe, right Wound is healing well. Continue f/u with Orthopedics   Diabetic polyneuropathy associated with type 2 diabetes mellitus Orders: -    Begin pregabalin (LYRICA) 50 MG capsule; Take 1 capsule (50 mg total) by mouth 3 (three) times daily.  He will continue gabapentin until he completes PASS program and gets approved for Lyrica. I have stressed to patient that once he gets Lyrica he will no longer take gabapentin. He verbalized understanding.   DM type 2, uncontrolled, with neuropathy Orders: -     Refill Insulin Glargine (LANTUS SOLOSTAR) 100 UNIT/ML Solostar Pen; Inject 24 Units into the skin daily at 10 pm. -     Refill etFORMIN (GLUCOPHAGE) 1000 MG tablet; Take 1 tablet (1,000 mg total) by mouth 2 (two) times daily with a meal. New dose--Take 1 pill twice per day -     Glucose (CBG) Sugars have improved on new dose of 20 units. I will monitor patient's sugar he may increase to 24 units today.    Return for September for DM f/u .       Ambrose Finland, NP-C [facility] [phone] 02/26/2015, 3:50 PM

## 2015-02-26 NOTE — Progress Notes (Signed)
Patient had 2nd toe on right foot amputated Sunday, July 18. Patient denies pain at this time, took percocet before he left home.   Patient needs lantus and metformin refilled.  Patient reports fasting blood sugars between 150-160.  Patients blood glucose reading 279, last ate about 1.5 hours ago, [place] and cheese sandwich. Patient had Lantus 20units last.   Patient ready to quit smoking.27

## 2015-03-01 ENCOUNTER — Ambulatory Visit: Payer: No Typology Code available for payment source | Admitting: Podiatry

## 2015-03-01 ENCOUNTER — Encounter: Payer: Self-pay | Admitting: Podiatry

## 2015-03-01 VITALS — BP 116/85 | HR 111 | Resp 15

## 2015-03-01 DIAGNOSIS — L601 Onycholysis: Secondary | ICD-10-CM

## 2015-03-01 DIAGNOSIS — G629 Polyneuropathy, unspecified: Secondary | ICD-10-CM

## 2015-03-01 DIAGNOSIS — L84 Corns and callosities: Secondary | ICD-10-CM

## 2015-03-01 NOTE — Patient Instructions (Signed)
Diabetes and Foot Care Diabetes may cause you to have problems because of poor blood supply (circulation) to your feet and legs. This may cause the skin on your feet to become thinner, break easier, and heal more slowly. Your skin may become dry, and the skin may peel and crack. You may also have nerve damage in your legs and feet causing decreased feeling in them. You may not notice minor injuries to your feet that could lead to infections or more serious problems. Taking care of your feet is one of the most important things you can do for yourself.  HOME CARE INSTRUCTIONS  Wear shoes at all times, even in the house. Do not go barefoot. Bare feet are easily injured.  Check your feet daily for blisters, cuts, and redness. If you cannot see the bottom of your feet, use a mirror or ask someone for help.  Wash your feet with warm water (do not use hot water) and mild soap. Then pat your feet and the areas between your toes until they are completely dry. Do not soak your feet as this can dry your skin.  Apply a moisturizing lotion or petroleum jelly (that does not contain alcohol and is unscented) to the skin on your feet and to dry, brittle toenails. Do not apply lotion between your toes.  Trim your toenails straight across. Do not dig under them or around the cuticle. File the edges of your nails with an emery board or nail file.  Do not cut corns or calluses or try to remove them with medicine.  Wear clean socks or stockings every day. Make sure they are not too tight. Do not wear knee-high stockings since they may decrease blood flow to your legs.  Wear shoes that fit properly and have enough cushioning. To break in new shoes, wear them for just a few hours a day. This prevents you from injuring your feet. Always look in your shoes before you put them on to be sure there are no objects inside.  Do not cross your legs. This may decrease the blood flow to your feet.  If you find a minor scrape,  cut, or break in the skin on your feet, keep it and the skin around it clean and dry. These areas may be cleansed with mild soap and water. Do not cleanse the area with peroxide, alcohol, or iodine.  When you remove an adhesive bandage, be sure not to damage the skin around it.  If you have a wound, look at it several times a day to make sure it is healing.  Do not use heating pads or hot water bottles. They may burn your skin. If you have lost feeling in your feet or legs, you may not know it is happening until it is too late.  Make sure your health care provider performs a complete foot exam at least annually or more often if you have foot problems. Report any cuts, sores, or bruises to your health care provider immediately. SEEK MEDICAL CARE IF:   You have an injury that is not healing.  You have cuts or breaks in the skin.  You have an ingrown nail.  You notice redness on your legs or feet.  You feel burning or tingling in your legs or feet.  You have pain or cramps in your legs and feet.  Your legs or feet are numb.  Your feet always feel cold. SEEK IMMEDIATE MEDICAL CARE IF:   There is increasing redness,   swelling, or pain in or around a wound.  There is a red line that goes up your leg.  Pus is coming from a wound.  You develop a fever or as directed by your health care provider.  You notice a bad smell coming from an ulcer or wound. Document Released: 07/17/2000 Document Revised: 03/22/2013 Document Reviewed: 12/27/2012 Exit[employer] Patient Information 2015 [employer]. This information is not intended to replace advice given to you by your health care provider. Make sure you discuss any questions you have with your health care provider.  

## 2015-03-05 ENCOUNTER — Encounter: Payer: Self-pay | Admitting: Podiatry

## 2015-03-05 NOTE — Progress Notes (Signed)
Patient ID: Ryan Shaw, male   DOB: 08/06/1965, 49 y.o.   MRN: [id number]  Subjective: [name] presents to the office today for follow-up evaluation of an ulceration on the tip of the right big toe and for peripheral neuropathy. He also states that the right hallux toenail is falling off he states the nails on the left first and second toes of also fallen off. He denies any redness or drainage on the area. Since last appointment he developed an ulceration and osteomyelitis of the right second digit and ultimately underwent amputation with orthopedics. He states that since last appointment the wound to the hallux is improved. He hasn't had to stop taking the NeuRemedy as it was upsetting his stomach. Denies any acute changes since last appointment no other complaints at this time.  Objective: AAO 3, NAD DP/PT pulses palpable, CRT less than 3 seconds Protective sensation absent with Dorann Ou monofilament Of the distal aspect of the right hallux there is a hyperkeratotic lesion over the site of the prior ulceration. After debridement underlying skin is intact. There is no swelling erythema, drainage or other clinical signs of infection. Status post limitation of the right second digit with sutures intact and the incision is well coapted. There is no overlying edema, erythema, increase in warmth. There is no drainage or purulence. No clinical signs of infection. Right hallux toenail is significantly loose and only attached by small portion on the proximal nail border. There is no surrounding erythema or drainage. Previously the left hallux and second digit toenail apparently had fallen off as well. There is no open sores, drainage or other signs of infection. The remaining nails are hypertrophic, dystrophic, discolored, brittle, elongated 6. There is no swelling erythema or drainage. No other open lesions or pre-ulcerative lesions. No pain with calf compression, swelling, warmth,  erythema.  Assessment: 49 year old male with onychomycosis, onycholysis right hallux toenail; peripheral neuropathy; status post indication right second digit with orthopedics  Plan: -Treatment options discussed including all alternatives, risks, and complications -Under sterile conditions the right hallux toenail was removed in total without any bleeding as it was significantly loose and there is no underlying ulceration, drainage or other signs of infection. Area was cleaned and antibiotic ointment and a bandage was applied.  -Nail sharply debrided 6 without complications bleeding -Continue Lyrica for neuropathy -Follow-up as scheduled or sooner if any problems arise. In the meantime, encouraged to call the office with any questions, concerns, change in symptoms.   Ovid Curd, DPM

## 2015-03-06 ENCOUNTER — Emergency Department (HOSPITAL_COMMUNITY)
Admission: EM | Admit: 2015-03-06 | Discharge: 2015-03-06 | Disposition: A | Discharge status: Home / Self Care | Payer: Self-pay | Attending: Emergency Medicine | Admitting: Emergency Medicine

## 2015-03-06 ENCOUNTER — Emergency Department (HOSPITAL_COMMUNITY): Payer: Self-pay

## 2015-03-06 ENCOUNTER — Encounter (HOSPITAL_COMMUNITY): Payer: Self-pay | Admitting: Emergency Medicine

## 2015-03-06 DIAGNOSIS — E119 Type 2 diabetes mellitus without complications: Secondary | ICD-10-CM | POA: Insufficient documentation

## 2015-03-06 DIAGNOSIS — Z794 Long term (current) use of insulin: Secondary | ICD-10-CM | POA: Insufficient documentation

## 2015-03-06 DIAGNOSIS — Z5189 Encounter for other specified aftercare: Secondary | ICD-10-CM

## 2015-03-06 DIAGNOSIS — Z8669 Personal history of other diseases of the nervous system and sense organs: Secondary | ICD-10-CM | POA: Insufficient documentation

## 2015-03-06 DIAGNOSIS — M1990 Unspecified osteoarthritis, unspecified site: Secondary | ICD-10-CM | POA: Insufficient documentation

## 2015-03-06 DIAGNOSIS — Z720 Tobacco use: Secondary | ICD-10-CM | POA: Insufficient documentation

## 2015-03-06 DIAGNOSIS — Z792 Long term (current) use of antibiotics: Secondary | ICD-10-CM | POA: Insufficient documentation

## 2015-03-06 DIAGNOSIS — Z79899 Other long term (current) drug therapy: Secondary | ICD-10-CM | POA: Insufficient documentation

## 2015-03-06 DIAGNOSIS — Z4801 Encounter for change or removal of surgical wound dressing: Secondary | ICD-10-CM | POA: Insufficient documentation

## 2015-03-06 LAB — CBC WITH DIFFERENTIAL/PLATELET
BASOS PCT: 1 % (ref 0–1)
Basophils Absolute: 0 10*3/uL (ref 0.0–0.1)
EOS ABS: 0.1 10*3/uL (ref 0.0–0.7)
Eosinophils Relative: 3 % (ref 0–5)
HEMATOCRIT: 40.1 % (ref 39.0–52.0)
Hemoglobin: 13.6 g/dL (ref 13.0–17.0)
LYMPHS ABS: 1.4 10*3/uL (ref 0.7–4.0)
LYMPHS PCT: 32 % (ref 12–46)
MCH: 27.9 pg (ref 26.0–34.0)
MCHC: 33.9 g/dL (ref 30.0–36.0)
MCV: 82.3 fL (ref 78.0–100.0)
MONO ABS: 0.3 10*3/uL (ref 0.1–1.0)
MONOS PCT: 7 % (ref 3–12)
Neutro Abs: 2.6 10*3/uL (ref 1.7–7.7)
Neutrophils Relative %: 57 % (ref 43–77)
PLATELETS: 291 10*3/uL (ref 150–400)
RBC: 4.87 MIL/uL (ref 4.22–5.81)
RDW: 12.5 % (ref 11.5–15.5)
WBC: 4.5 10*3/uL (ref 4.0–10.5)

## 2015-03-06 LAB — BASIC METABOLIC PANEL
Anion gap: 8 (ref 5–15)
BUN: 14 mg/dL (ref 6–20)
CO2: 24 mmol/L (ref 22–32)
Calcium: 9.3 mg/dL (ref 8.9–10.3)
Chloride: 104 mmol/L (ref 101–111)
Creatinine, Ser: 1.22 mg/dL (ref 0.61–1.24)
GLUCOSE: 244 mg/dL — AB (ref 65–99)
POTASSIUM: 4.2 mmol/L (ref 3.5–5.1)
SODIUM: 136 mmol/L (ref 135–145)

## 2015-03-06 MED ORDER — ONDANSETRON HCL 4 MG/2ML IJ SOLN
4.0000 mg | Freq: Once | INTRAMUSCULAR | Status: AC
  Administered 2015-03-06: 4 mg via INTRAVENOUS
  Filled 2015-03-06: qty 2

## 2015-03-06 MED ORDER — MORPHINE SULFATE 4 MG/ML IJ SOLN
4.0000 mg | Freq: Once | INTRAMUSCULAR | Status: AC
  Administered 2015-03-06: 4 mg via INTRAVENOUS
  Filled 2015-03-06: qty 1

## 2015-03-06 NOTE — ED Notes (Addendum)
1st set of Blood cultures drawn and set at bedside, awaiting orders

## 2015-03-06 NOTE — Discharge Instructions (Signed)
° °  Toe Injuries and Amputations °You have cut off (amputated) part of your toe. Your outcome depends largely on how much was amputated. If just the tip is amputated, often the end of the toe will grow back and the toe may return much to the same as it was before the injury. If more of the toe is missing, your caregiver has done the best with the tissue remaining to allow you to keep as much toe as is possible or has finished the amputation at a level that will leave you with the most functional toe. This means a toe that will work the best for you. Please read the instructions outlined below and refer to this sheet in the next few weeks. These instructions provide you with general information on caring for yourself. Your caregiver may also give you specific instructions. While your treatment has been done according to the most current medical practices available, unavoidable complications occasionally occur. If you have any problems or questions after discharge, call your caregiver. °HOME CARE INSTRUCTIONS  °· You may resume a normal diet and activities as directed or allowed. °· Keep your foot elevated when possible. This helps decrease pain and swelling. °· Keep ice packs (a bag of ice wrapped in a towel) on the injured area for 15-20 minutes, 03-04 times per day, for the first two days. Use ice only if OK with your caregiver. °· Change dressings if necessary or as directed. °· Clean the wounded area as directed. °· Only take over-the-counter or prescription medicines for pain, discomfort, or fever as directed by your caregiver. °· Keep appointments as directed. °SEEK IMMEDIATE MEDICAL CARE IF: °· There is redness, swelling, numbness or increasing pain in the wound. °· There is pus coming from wound. °· You have an unexplained oral temperature above 102° F (38.9° C) or as your caregiver suggests. °· There is a bad (foul) smell coming from the wound or dressing. °· The edges of the wound break open (the edges are  not staying together) after sutures or staples have been removed. °Document Released: 06/10/2005 Document Revised: 10/12/2011 Document Reviewed: 11/07/2008 °ExitCare® Patient Information ©2015 [employer]. This information is not intended to replace advice given to you by your health care provider. Make sure you discuss any questions you have with your health care provider. ° °

## 2015-03-06 NOTE — ED Provider Notes (Signed)
CSN: [id number]     Arrival date & time 03/06/15  1108 History   First MD Initiated Contact with Patient 03/06/15 1156     Chief Complaint  Patient presents with  . Circulatory Problem     (Consider location/radiation/quality/duration/timing/severity/associated sxs/prior Treatment) HPI Comments: Patient presents to the emergency department with chief complaint of right foot pain. He is status post almost 2 weeks right second toe amputation secondary to osteomyelitis. Patient was seen in clinic yesterday by Dr. Magnus Ivan for suture removal. Patient states that this morning when he awoke he noticed some discoloration to his right foot and increased pain. He denies any associated fevers, chills, nausea, or vomiting. He would like to be evaluated to ensure that he is not having spread of infection.  The history is provided by the patient. No language interpreter was used.    Past Medical History  Diagnosis Date  . Diabetes mellitus without complication   . Neuropathy   . Arthritis    Past Surgical History  Procedure Laterality Date  . Amputation Right 02/17/2015    Procedure: SECOND RAY AMPUTATION;  Surgeon: Kathryne Hitch, MD;  Location: [facility] OR;  Service: Orthopedics;  Laterality: Right;   Family History  Problem Relation Age of Onset  . Cancer Mother   . Aneurysm Father     brain   History  Substance Use Topics  . Smoking status: Current Every Day Smoker -- 0.25 packs/day    Types: Cigarettes  . Smokeless tobacco: Not on file     Comment: uses a vapor -puff-4-5 x/day  . Alcohol Use: No     Comment: patient states he quit drinking since he has been sick    Review of Systems  Constitutional: Negative for fever and chills.  Respiratory: Negative for shortness of breath.   Cardiovascular: Negative for chest pain.  Gastrointestinal: Negative for nausea, vomiting, diarrhea and constipation.  Genitourinary: Negative for dysuria.  Musculoskeletal: Positive for arthralgias.   Skin: Positive for wound.  All other systems reviewed and are negative.     Allergies  Other  Home Medications   Prior to Admission medications   Medication Sig Start Date End Date Taking? Authorizing Provider  gabapentin (NEURONTIN) 300 MG capsule Take 2 capsules (600 mg total) by mouth 3 (three) times daily. 11/01/14  Yes Ambrose Finland, NP  ibuprofen (ADVIL,MOTRIN) 600 MG tablet Take 1 tablet (600 mg total) by mouth every 6 (six) hours as needed. Patient taking differently: Take 600 mg by mouth at bedtime as needed (pain).  11/12/14  Yes Ambrose Finland, NP  Insulin Glargine (LANTUS SOLOSTAR) 100 UNIT/ML Solostar Pen Inject 24 Units into the skin daily at 10 pm. 02/26/15  Yes Ambrose Finland, NP  metFORMIN (GLUCOPHAGE) 1000 MG tablet Take 1 tablet (1,000 mg total) by mouth 2 (two) times daily with a meal. New dose--Take 1 pill twice per day 02/26/15  Yes Ambrose Finland, NP  methocarbamol (ROBAXIN-750) 750 MG tablet Take 1 tablet (750 mg total) by mouth 4 (four) times daily. 02/14/15  Yes Lorre Nick, MD  oxyCODONE-acetaminophen (PERCOCET/ROXICET) 5-325 MG per tablet Take 1-2 tablets by mouth every 4 (four) hours as needed for severe pain. 02/18/15  Yes Osvaldo Shipper, MD  sulfamethoxazole-trimethoprim (BACTRIM DS,SEPTRA DS) 800-160 MG per tablet Take 1 tablet by mouth every 12 (twelve) hours. For 2 weeks. 02/18/15  Yes Osvaldo Shipper, MD  terbinafine (LAMISIL) 250 MG tablet Take 1 tablet (250 mg total) by mouth daily. 01/05/15  Yes Vikki Ports  Joellen Jersey, NP  pregabalin (LYRICA) 50 MG capsule Take 1 capsule (50 mg total) by mouth 3 (three) times daily. 02/26/15   Ambrose Finland, NP   BP 130/83 mmHg  Pulse 94  Temp(Src) 98.2 F (36.8 C) (Oral)  Resp 18  SpO2 98% Physical Exam  Constitutional: He is oriented to person, place, and time. He appears well-developed and well-nourished.  HENT:  Head: Normocephalic and atraumatic.  Eyes: Conjunctivae and EOM are normal. Pupils are equal, round, and  reactive to light. Right eye exhibits no discharge. Left eye exhibits no discharge. No scleral icterus.  Neck: Normal range of motion. Neck supple. No JVD present.  Cardiovascular: Normal rate, regular rhythm, normal heart sounds and intact distal pulses.  Exam reveals no gallop and no friction rub.   No murmur heard. Intact distal pulses with brisk capillary refill  Pulmonary/Chest: Effort normal and breath sounds normal. No respiratory distress. He has no wheezes. He has no rales. He exhibits no tenderness.  Abdominal: Soft. He exhibits no distension and no mass. There is no tenderness. There is no rebound and no guarding.  Musculoskeletal: Normal range of motion. He exhibits no edema or tenderness.  Neurological: He is alert and oriented to person, place, and time.  Skin: Skin is warm and dry.  No visible erythema or cellulitis on my exam, no obvious abscess, no discharge or drainage from toe amputation site, sutures remain intact  Psychiatric: He has a normal mood and affect. His behavior is normal. Judgment and thought content normal.  Nursing note and vitals reviewed.   ED Course  Procedures (including critical care time) Results for orders placed or performed during the hospital encounter of 03/06/15  CBC with Differential/Platelet  Result Value Ref Range   WBC 4.5 4.0 - 10.5 K/uL   RBC 4.87 4.22 - 5.81 MIL/uL   Hemoglobin 13.6 13.0 - 17.0 g/dL   HCT 16.1 09.6 - 04.5 %   MCV 82.3 78.0 - 100.0 fL   MCH 27.9 26.0 - 34.0 pg   MCHC 33.9 30.0 - 36.0 g/dL   RDW 40.9 81.1 - 91.4 %   Platelets 291 150 - 400 K/uL   Neutrophils Relative % 57 43 - 77 %   Neutro Abs 2.6 1.7 - 7.7 K/uL   Lymphocytes Relative 32 12 - 46 %   Lymphs Abs 1.4 0.7 - 4.0 K/uL   Monocytes Relative 7 3 - 12 %   Monocytes Absolute 0.3 0.1 - 1.0 K/uL   Eosinophils Relative 3 0 - 5 %   Eosinophils Absolute 0.1 0.0 - 0.7 K/uL   Basophils Relative 1 0 - 1 %   Basophils Absolute 0.0 0.0 - 0.1 K/uL  Basic metabolic  panel  Result Value Ref Range   Sodium 136 135 - 145 mmol/L   Potassium 4.2 3.5 - 5.1 mmol/L   Chloride 104 101 - 111 mmol/L   CO2 24 22 - 32 mmol/L   Glucose, Bld 244 (H) 65 - 99 mg/dL   BUN 14 6 - 20 mg/dL   Creatinine, Ser 7.82 0.61 - 1.24 mg/dL   Calcium 9.3 8.9 - 95.6 mg/dL   GFR calc non Af Amer >60 >60 mL/min   GFR calc Af Amer >60 >60 mL/min   Anion gap 8 5 - 15   Dg Chest 2 View  02/14/2015   CLINICAL DATA:  Mid chest pain began yesterday, history of diabetes, smoking history  EXAM: CHEST  2 VIEW  COMPARISON:  None.  FINDINGS: No active infiltrate or effusion is seen. Mediastinal and hilar contours are unremarkable. The heart is within normal limits in size. No bony abnormality is seen.  IMPRESSION: No active cardiopulmonary disease.   Electronically Signed   By: Dwyane Dee M.D.   On: 02/14/2015 16:46   Mr Foot Left Wo Contrast  02/06/2015   CLINICAL DATA:  Left second toe lucency, assessment for osteomyelitis. Diabetes and neuropathy.  EXAM: MRI OF THE LEFT FOREFOOT WITHOUT CONTRAST  TECHNIQUE: Multiplanar, multisequence MR imaging was performed. No intravenous contrast was administered.  COMPARISON:  01/21/2015  FINDINGS: Motion artifact is present and severe. There is signal dropout in the area of concern along the distal phalanx of the second toe despite repeated attempts. The areas barely visible at all, and while I do not observe compelling evidence of osteomyelitis it is difficult to summon a great deal of confidence in the negative predictive value of this exam for the distal phalanx of the second toe.  Subcutaneous edema noted along the dorsum of the foot with edema tracking diffusely along the plantar musculature of the foot. No drainable abscess. No obvious findings of gas in the soft tissues. There appears to be some medial arthropathy at the first digit interphalangeal joint medially.  First digit sesamoids unremarkable.  Mild hallux valgus.  IMPRESSION: 1. 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 Today's exam does not reveal definite rib edema in the second toe, but is severely degraded by motion artifact and signal dropout in the toes such that I have little confidence in the negative predictive value. This is despite re- scan attempts. If the patient's clinical presentation it is in area is compatible with osteomyelitis then presumptive treatment would be recommended despite the negative appearance on today's MRI. 2. Hallux valgus. 3. Low-level dorsal subcutaneous edema in the foot could be cellulitis, and there is low grade edema tracking along the plantar musculature of the foot which could reflect low grade myositis.   Electronically Signed   By: Gaylyn Rong M.D.   On: 02/06/2015 16:17   Dg Foot Complete Right  03/06/2015   CLINICAL DATA:  Pain.  Recent second digit amputation.  EXAM: RIGHT FOOT COMPLETE - 3+ VIEW  COMPARISON:  February 16, 2015  FINDINGS: Frontal, oblique, and lateral views obtained. The patient has had amputation at the level of the second MTP joint. A few tiny bone fragments remain distal to the second metatarsal. There is no acute fracture or dislocation. No erosive change or bony destruction. A small calcification just medial to the distal first metatarsal is stable and felt to have arthropathic etiology. There is a spur arising from the inferior calcaneus. Joint spaces appear intact except for mild narrowing of the first MTP joint.  IMPRESSION: Postoperative change at the level of the second MTP joint. No erosive change or bony destruction. No acute fracture or dislocation. 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 Spur arising from the inferior calcaneus.   Electronically Signed   By: Bretta Bang III M.D.   On: 03/06/2015 13:19   Dg Foot Complete Right  02/16/2015   CLINICAL DATA:  Right foot pain/swelling  EXAM: RIGHT FOOT COMPLETE - 3+ VIEW  COMPARISON:   01/21/2015  FINDINGS: No fracture or dislocation is seen.  Lucency/cortical involving the distal tuft of the second digit with overlying soft tissue swelling, worrisome for osteomyelitis.  The joint spaces are preserved.  No radiopaque  foreign body is seen.  IMPRESSION: No fracture or dislocation is seen.  Lucency/cortical irregularity involving the second distal tuft with overlying soft tissue swelling, worrisome for osteomyelitis.   Electronically Signed   By: Charline Bills M.D.   On: 02/16/2015 15:15     Imaging Review No results found.   EKG Interpretation None      MDM   Final diagnoses:  Visit for wound check    Patient here for wound check of right second toe amputation. There is no discharge drainage, no cellulitis, or erythema, no tenderness of the foot. Patient was seen by orthopedics yesterday for suture removal. Labs and imaging are reassuring. Doubt any worsening infection. Patient can follow-up with orthopedics in 1 week for recheck. Return precautions given. Patient understands and agrees with the plan. He is stable and ready for discharge.    Roxy Horseman, PA-C 03/06/15 1506  Blane Ohara, MD 03/06/15 [phone]

## 2015-03-06 NOTE — ED Notes (Signed)
Pt reports he had his toe amputated on the 18th and had a follow up yesterday. MD reported that the foot is not healing well, and pt reports the top of his foot is discolored this morning.

## 2015-03-06 NOTE — Progress Notes (Signed)
Buddy Duty [facility] for [facility] [phone]   Patient is established with care and an active orange card holder at the [facility] center. Patient states he has an upcoming appointment with the clinic. Pt expressed no concerns with his pcp or orange card at this time. Once discharged patient will be linked with  [facility] case manager. Pt also educated on proper uses of the ED. My contact information provided for any future questions or concerns. No other [facility] needs identified at this time.

## 2015-03-30 ENCOUNTER — Encounter (HOSPITAL_COMMUNITY): Payer: Self-pay | Admitting: Emergency Medicine

## 2015-03-30 ENCOUNTER — Emergency Department (EMERGENCY_DEPARTMENT_HOSPITAL): Admit: 2015-03-30 | Discharge: 2015-03-30 | Disposition: A | Discharge status: Home / Self Care | Payer: Self-pay

## 2015-03-30 ENCOUNTER — Emergency Department (HOSPITAL_COMMUNITY)
Admission: EM | Admit: 2015-03-30 | Discharge: 2015-03-30 | Disposition: A | Discharge status: Home / Self Care | Payer: Self-pay | Attending: Emergency Medicine | Admitting: Emergency Medicine

## 2015-03-30 DIAGNOSIS — Z79899 Other long term (current) drug therapy: Secondary | ICD-10-CM | POA: Insufficient documentation

## 2015-03-30 DIAGNOSIS — R609 Edema, unspecified: Secondary | ICD-10-CM

## 2015-03-30 DIAGNOSIS — Z720 Tobacco use: Secondary | ICD-10-CM | POA: Insufficient documentation

## 2015-03-30 DIAGNOSIS — E119 Type 2 diabetes mellitus without complications: Secondary | ICD-10-CM | POA: Insufficient documentation

## 2015-03-30 DIAGNOSIS — R2243 Localized swelling, mass and lump, lower limb, bilateral: Secondary | ICD-10-CM | POA: Insufficient documentation

## 2015-03-30 DIAGNOSIS — Z794 Long term (current) use of insulin: Secondary | ICD-10-CM | POA: Insufficient documentation

## 2015-03-30 DIAGNOSIS — R600 Localized edema: Secondary | ICD-10-CM

## 2015-03-30 DIAGNOSIS — M1990 Unspecified osteoarthritis, unspecified site: Secondary | ICD-10-CM | POA: Insufficient documentation

## 2015-03-30 LAB — CBC WITH DIFFERENTIAL/PLATELET
BASOS ABS: 0 10*3/uL (ref 0.0–0.1)
BASOS PCT: 1 % (ref 0–1)
Eosinophils Absolute: 0.3 10*3/uL (ref 0.0–0.7)
Eosinophils Relative: 5 % (ref 0–5)
HEMATOCRIT: 37.7 % — AB (ref 39.0–52.0)
HEMOGLOBIN: 12.7 g/dL — AB (ref 13.0–17.0)
LYMPHS PCT: 39 % (ref 12–46)
Lymphs Abs: 2.4 10*3/uL (ref 0.7–4.0)
MCH: 28.3 pg (ref 26.0–34.0)
MCHC: 33.7 g/dL (ref 30.0–36.0)
MCV: 84.2 fL (ref 78.0–100.0)
Monocytes Absolute: 0.5 10*3/uL (ref 0.1–1.0)
Monocytes Relative: 8 % (ref 3–12)
NEUTROS ABS: 2.9 10*3/uL (ref 1.7–7.7)
NEUTROS PCT: 47 % (ref 43–77)
Platelets: 223 10*3/uL (ref 150–400)
RBC: 4.48 MIL/uL (ref 4.22–5.81)
RDW: 13.5 % (ref 11.5–15.5)
WBC: 6 10*3/uL (ref 4.0–10.5)

## 2015-03-30 LAB — URINALYSIS, ROUTINE W REFLEX MICROSCOPIC
BILIRUBIN URINE: NEGATIVE
GLUCOSE, UA: NEGATIVE mg/dL
Hgb urine dipstick: NEGATIVE
KETONES UR: NEGATIVE mg/dL
LEUKOCYTES UA: NEGATIVE
NITRITE: NEGATIVE
PH: 5.5 (ref 5.0–8.0)
PROTEIN: NEGATIVE mg/dL
Specific Gravity, Urine: 1.022 (ref 1.005–1.030)
Urobilinogen, UA: 0.2 mg/dL (ref 0.0–1.0)

## 2015-03-30 LAB — COMPREHENSIVE METABOLIC PANEL
ALBUMIN: 3.5 g/dL (ref 3.5–5.0)
ALT: 21 U/L (ref 17–63)
ANION GAP: 6 (ref 5–15)
AST: 19 U/L (ref 15–41)
Alkaline Phosphatase: 65 U/L (ref 38–126)
BUN: 7 mg/dL (ref 6–20)
CHLORIDE: 108 mmol/L (ref 101–111)
CO2: 25 mmol/L (ref 22–32)
Calcium: 8.7 mg/dL — ABNORMAL LOW (ref 8.9–10.3)
Creatinine, Ser: 0.92 mg/dL (ref 0.61–1.24)
GFR calc Af Amer: >60 mL/min (ref >60)
Glucose, Bld: 120 mg/dL — ABNORMAL HIGH (ref 65–99)
POTASSIUM: 3.9 mmol/L (ref 3.5–5.1)
Sodium: 139 mmol/L (ref 135–145)
TOTAL PROTEIN: 6.2 g/dL — AB (ref 6.5–8.1)
Total Bilirubin: 0.4 mg/dL (ref 0.3–1.2)

## 2015-03-30 LAB — BRAIN NATRIURETIC PEPTIDE: B NATRIURETIC PEPTIDE 5: 25.4 pg/mL (ref 0.0–100.0)

## 2015-03-30 MED ORDER — HYDROCODONE-ACETAMINOPHEN 5-325 MG PO TABS
1.0000 | ORAL_TABLET | Freq: Once | ORAL | Status: AC
  Administered 2015-03-30: 1 via ORAL
  Filled 2015-03-30: qty 1

## 2015-03-30 NOTE — ED Provider Notes (Signed)
CSN: [id number]     Arrival date & time 03/30/15  1458 History   First MD Initiated Contact with Patient 03/30/15 1748     Chief Complaint  Patient presents with  . Foot Pain   Ryan Shaw is a 49 y.o. male with a history of diabetes, neuropathy and arthritis who presents to the emergency department complaining of bilateral foot and leg edema for the past 3 days. He also reports this is associated with pain. He currently complains of 10 out of 10 pain in his bilateral legs. He does report that his swelling can somewhat decreased when he elevates his legs. Patient denies history of previous leg edema. Patient reports he had right second toe amputation on 02/17/2015 by Dr. Magnus Ivan. Patient has taken nothing for treatment today. Patient believes his right ankle is more swollen than his left. He denies pain while walking. The patient denies fevers, chills, recent illness, chest pain, shortness of breath, history of DVT, history of PEs, history of CHF, or any recent injury or trauma.  (Consider location/radiation/quality/duration/timing/severity/associated sxs/prior Treatment) HPI  Past Medical History  Diagnosis Date  . Diabetes mellitus without complication   . Neuropathy   . Arthritis    Past Surgical History  Procedure Laterality Date  . Amputation Right 02/17/2015    Procedure: SECOND RAY AMPUTATION;  Surgeon: Kathryne Hitch, MD;  Location: [facility];  Service: Orthopedics;  Laterality: Right;   Family History  Problem Relation Age of Onset  . Cancer Mother   . Aneurysm Father     brain   Social History  Substance Use Topics  . Smoking status: Current Every Day Smoker -- 0.25 packs/day    Types: Cigarettes  . Smokeless tobacco: None     Comment: uses a vapor -puff-4-5 x/day  . Alcohol Use: No     Comment: patient states he quit drinking since he has been sick    Review of Systems  Constitutional: Negative for fever and chills.  HENT: Negative for congestion and sore  throat.   Eyes: Negative for visual disturbance.  Respiratory: Negative for cough, shortness of breath and wheezing.   Cardiovascular: Positive for leg swelling. Negative for chest pain and palpitations.  Gastrointestinal: Negative for nausea, vomiting, abdominal pain and diarrhea.  Genitourinary: Negative for dysuria.  Musculoskeletal: Negative for back pain and neck pain.  Skin: Negative for rash.  Neurological: Negative for headaches.      Allergies  Other  Home Medications   Prior to Admission medications   Medication Sig Start Date End Date Taking? Authorizing Provider  gabapentin (NEURONTIN) 300 MG capsule Take 2 capsules (600 mg total) by mouth 3 (three) times daily. 11/01/14   Ambrose Finland, NP  ibuprofen (ADVIL,MOTRIN) 600 MG tablet Take 1 tablet (600 mg total) by mouth every 6 (six) hours as needed. Patient taking differently: Take 600 mg by mouth at bedtime as needed (pain).  11/12/14   Ambrose Finland, NP  Insulin Glargine (LANTUS SOLOSTAR) 100 UNIT/ML Solostar Pen Inject 24 Units into the skin daily at 10 pm. 02/26/15   Ambrose Finland, NP  metFORMIN (GLUCOPHAGE) 1000 MG tablet Take 1 tablet (1,000 mg total) by mouth 2 (two) times daily with a meal. New dose--Take 1 pill twice per day 02/26/15   Ambrose Finland, NP  methocarbamol (ROBAXIN-750) 750 MG tablet Take 1 tablet (750 mg total) by mouth 4 (four) times daily. 02/14/15   Lorre Nick, MD  oxyCODONE-acetaminophen (PERCOCET/ROXICET) 5-325 MG per tablet Take 1-2  tablets by mouth every 4 (four) hours as needed for severe pain. 02/18/15   Osvaldo Shipper, MD  pregabalin (LYRICA) 50 MG capsule Take 1 capsule (50 mg total) by mouth 3 (three) times daily. 02/26/15   Ambrose Finland, NP  sulfamethoxazole-trimethoprim (BACTRIM DS,SEPTRA DS) 800-160 MG per tablet Take 1 tablet by mouth every 12 (twelve) hours. For 2 weeks. 02/18/15   Osvaldo Shipper, MD  terbinafine (LAMISIL) 250 MG tablet Take 1 tablet (250 mg total) by mouth daily. 01/05/15    Ambrose Finland, NP   BP 146/90 mmHg  Pulse 87  Temp(Src) 98.3 F (36.8 C) (Oral)  Resp 20  SpO2 100% Physical Exam  Constitutional: He is oriented to person, place, and time. He appears well-developed and well-nourished. No distress.  Nontoxic appearing.  HENT:  Head: Normocephalic and atraumatic.  Mouth/Throat: Oropharynx is clear and moist.  Eyes: Conjunctivae are normal. Pupils are equal, round, and reactive to light. Right eye exhibits no discharge. Left eye exhibits no discharge.  Neck: Normal range of motion. Neck supple. No JVD present. No tracheal deviation present.  Cardiovascular: Normal rate, regular rhythm, normal heart sounds and intact distal pulses.  Exam reveals no gallop and no friction rub.   No murmur heard. Bilateral radial, posterior tibialis and dorsalis pedis pulses are intact.    Pulmonary/Chest: Effort normal and breath sounds normal. No respiratory distress. He has no wheezes. He has no rales.  Lungs are clear to auscultation bilaterally.  Abdominal: Soft. There is no tenderness. There is no guarding.  Musculoskeletal: Normal range of motion. He exhibits edema and tenderness.  Mild bilateral ankle and pedal edema. Calves are equal in size. Patient is spontaneously moving all extremities in a coordinated fashion exhibiting good strength.   Lymphadenopathy:    He has no cervical adenopathy.  Neurological: He is alert and oriented to person, place, and time. Coordination normal.  Skin: Skin is warm and dry. No rash noted. He is not diaphoretic. No erythema. No pallor.  Psychiatric: He has a normal mood and affect. His behavior is normal.  Nursing note and vitals reviewed.   ED Course  Procedures (including critical care time) Labs Review Labs Reviewed  COMPREHENSIVE METABOLIC PANEL - Abnormal; Notable for the following:    Glucose, Bld 120 (*)    Calcium 8.7 (*)    Total Protein 6.2 (*)    All other components within normal limits  CBC WITH  DIFFERENTIAL/PLATELET - Abnormal; Notable for the following:    Hemoglobin 12.7 (*)    HCT 37.7 (*)    All other components within normal limits  BRAIN NATRIURETIC PEPTIDE  URINALYSIS, ROUTINE W REFLEX MICROSCOPIC (NOT AT [facility])    Imaging Review No results found.    EKG Interpretation None      Filed Vitals:   03/30/15 1501  BP: 146/90  Pulse: 87  Temp: 98.3 F (36.8 C)  TempSrc: Oral  Resp: 20  SpO2: 100%     MDM   Meds given in ED:  Medications  HYDROcodone-acetaminophen (NORCO/VICODIN) 5-325 MG per tablet 1 tablet (1 tablet Oral Given 03/30/15 1838)    New Prescriptions   No medications on file    Final diagnoses:  Bilateral edema of lower extremity   Patient presented with bilateral lower extremity edema for the past 3 days. On exam the patient does have bilateral pedal and ankle edema. Calves are equal in size. He is afebrile and nontoxic appearing. Bilateral lower extremity venous doppler was negative for  DVT. CMP is unremarkable. He has normal liver enzymes. Normal creatinine. BNP is within normal limits. CBC is unremarkable. Patient awaiting urinalysis. As for discharge to have the patient follow-up with primary care provider. I advised the patient to get compression stockings for his bilateral peripheral edema and to work on his sodium intake. I advised the patient to follow-up with their primary care provider this week. I advised the patient to return to the emergency department with new or worsening symptoms or new concerns. The patient verbalized understanding and agreement with plan.    This patient was discussed with Dr. Clarene Duke who agrees with assessment and plan.   Everlene Farrier, PA-C 03/30/15 2041  Laurence Spates, MD 03/30/15 2111

## 2015-03-30 NOTE — ED Notes (Signed)
Patient transported to Ultrasound 

## 2015-03-30 NOTE — Discharge Instructions (Signed)
Please go to medical supply store to get compression stockings.   Peripheral Edema You have swelling in your legs (peripheral edema). This swelling is due to excess accumulation of salt and water in your body. Edema may be a sign of heart, kidney or liver disease, or a side effect of a medication. It may also be due to problems in the leg veins. Elevating your legs and using special support stockings may be very helpful, if the cause of the swelling is due to poor venous circulation. Avoid long periods of standing, whatever the cause. Treatment of edema depends on identifying the cause. Chips, pretzels, pickles and other salty foods should be avoided. Restricting salt in your diet is almost always needed. Water pills (diuretics) are often used to remove the excess salt and water from your body via urine. These medicines prevent the kidney from reabsorbing sodium. This increases urine flow. Diuretic treatment may also result in lowering of potassium levels in your body. Potassium supplements may be needed if you have to use diuretics daily. Daily weights can help you keep track of your progress in clearing your edema. You should call your caregiver for follow up care as recommended. SEEK IMMEDIATE MEDICAL CARE IF:   You have increased swelling, pain, redness, or heat in your legs.  You develop shortness of breath, especially when lying down.  You develop chest or abdominal pain, weakness, or fainting.  You have a fever. Document Released: 08/27/2004 Document Revised: 10/12/2011 Document Reviewed: 08/07/2009 [facility] Patient Information 2015 [place], [place]. This information is not intended to replace advice given to you by your health care provider. Make sure you discuss any questions you have with your health care provider.

## 2015-03-30 NOTE — Progress Notes (Signed)
VASCULAR LAB PRELIMINARY  PRELIMINARY  PRELIMINARY  PRELIMINARY  Bilateral lower extremity venous duplex completed.    Preliminary report:  There is no obvious evidence of DVT or SVT noted in the bilateral lower extremities.   Breahna Boylen, RVT 03/30/2015, 7:01 PM

## 2015-03-30 NOTE — ED Notes (Signed)
Pt. Stated, both feet have been swollen for 3 days.

## 2015-04-08 ENCOUNTER — Ambulatory Visit: Payer: Self-pay | Admitting: Internal Medicine

## 2015-04-09 ENCOUNTER — Ambulatory Visit: Payer: Self-pay | Admitting: Internal Medicine

## 2015-04-09 ENCOUNTER — Ambulatory Visit: Payer: Self-pay | Attending: Internal Medicine | Admitting: Internal Medicine

## 2015-04-09 ENCOUNTER — Encounter: Payer: Self-pay | Admitting: Internal Medicine

## 2015-04-09 ENCOUNTER — Ambulatory Visit (HOSPITAL_COMMUNITY)
Admission: RE | Admit: 2015-04-09 | Discharge: 2015-04-09 | Disposition: A | Discharge status: Home / Self Care | Payer: Self-pay | Source: Ambulatory Visit | Attending: Internal Medicine | Admitting: Internal Medicine

## 2015-04-09 VITALS — BP 148/91 | HR 81 | Temp 98.0°F | Resp 16 | Ht 78.0 in | Wt 224.4 lb

## 2015-04-09 DIAGNOSIS — Z794 Long term (current) use of insulin: Secondary | ICD-10-CM | POA: Insufficient documentation

## 2015-04-09 DIAGNOSIS — E1140 Type 2 diabetes mellitus with diabetic neuropathy, unspecified: Secondary | ICD-10-CM

## 2015-04-09 DIAGNOSIS — Z720 Tobacco use: Secondary | ICD-10-CM | POA: Insufficient documentation

## 2015-04-09 DIAGNOSIS — M79674 Pain in right toe(s): Secondary | ICD-10-CM

## 2015-04-09 DIAGNOSIS — M79671 Pain in right foot: Secondary | ICD-10-CM | POA: Insufficient documentation

## 2015-04-09 DIAGNOSIS — R030 Elevated blood-pressure reading, without diagnosis of hypertension: Secondary | ICD-10-CM

## 2015-04-09 DIAGNOSIS — IMO0002 Reserved for concepts with insufficient information to code with codable children: Secondary | ICD-10-CM

## 2015-04-09 DIAGNOSIS — Z89421 Acquired absence of other right toe(s): Secondary | ICD-10-CM | POA: Insufficient documentation

## 2015-04-09 DIAGNOSIS — IMO0001 Reserved for inherently not codable concepts without codable children: Secondary | ICD-10-CM

## 2015-04-09 DIAGNOSIS — M7989 Other specified soft tissue disorders: Secondary | ICD-10-CM | POA: Insufficient documentation

## 2015-04-09 DIAGNOSIS — E1165 Type 2 diabetes mellitus with hyperglycemia: Secondary | ICD-10-CM

## 2015-04-09 LAB — POCT GLYCOSYLATED HEMOGLOBIN (HGB A1C): HEMOGLOBIN A1C: 8.5

## 2015-04-09 LAB — GLUCOSE, POCT (MANUAL RESULT ENTRY): POC GLUCOSE: 130 mg/dL — AB (ref 70–99)

## 2015-04-09 MED ORDER — INSULIN GLARGINE 100 UNIT/ML SOLOSTAR PEN: 26.0000 [IU] | PEN_INJECTOR | Freq: Every day | SUBCUTANEOUS | Status: DC

## 2015-04-09 MED ORDER — OXYCODONE-ACETAMINOPHEN 5-325 MG PO TABS: 1.0000 | ORAL_TABLET | ORAL | Status: DC | PRN

## 2015-04-09 NOTE — Progress Notes (Signed)
Patient here for follow up from the ED Patient was seen in the ed for edema to bilateral feet Patient also complains of having generalized pain down his right side

## 2015-04-09 NOTE — Progress Notes (Signed)
Patient ID: Ryan Shaw, male   DOB: 15-Jul-1966, 49 y.o.   MRN: [id number]  CC: Diabetes follow-up, ED follow-up  HPI: Ryan Shaw is a 49 y.o. male here today for a follow up visit.  Patient has past medical history of type 2 diabetes, neuropathy, arthritis, status post right toe amputation.  Patient reports that he was seen in the emergency department on 8/27 for bilateral lower extremity swelling and pain. At that time patient had a Doppler completed which revealed no DVTs. Patient was advised at that time to purchase compression hose to help with swelling, decrease salt intake, elevate extremities. Since that time patient reports that he has been having generalized pain down his entire right side of his body described as a dull ache and tingling sensation. He has been using ibuprofen for pain which provides no relief. Patient reports some chills.  Patient reports he takes his insulin nightly and checks his sugars 4 times per day. Fasting blood sugars range from 120-150 and postprandials ranges are reportedly around 180. Patient did not bring CBG log for review today.   Allergies  Allergen Reactions  . Other Nausea And Vomiting    NeuRemedy - Dietary Supplement   Past Medical History  Diagnosis Date  . Diabetes mellitus without complication   . Neuropathy   . Arthritis    Current Outpatient Prescriptions on File Prior to Visit  Medication Sig Dispense Refill  . gabapentin (NEURONTIN) 300 MG capsule Take 2 capsules (600 mg total) by mouth 3 (three) times daily. 180 capsule 4  . ibuprofen (ADVIL,MOTRIN) 600 MG tablet Take 1 tablet (600 mg total) by mouth every 6 (six) hours as needed. (Patient taking differently: Take 600 mg by mouth at bedtime as needed (pain). ) 30 tablet 0  . Insulin Glargine (LANTUS SOLOSTAR) 100 UNIT/ML Solostar Pen Inject 24 Units into the skin daily at 10 pm. 30 mL 6  . metFORMIN (GLUCOPHAGE) 1000 MG tablet Take 1 tablet (1,000 mg total) by mouth 2 (two) times  daily with a meal. New dose--Take 1 pill twice per day 60 tablet 4  . methocarbamol (ROBAXIN-750) 750 MG tablet Take 1 tablet (750 mg total) by mouth 4 (four) times daily. 30 tablet 0  . oxyCODONE-acetaminophen (PERCOCET/ROXICET) 5-325 MG per tablet Take 1-2 tablets by mouth every 4 (four) hours as needed for severe pain. 15 tablet 0  . pregabalin (LYRICA) 50 MG capsule Take 1 capsule (50 mg total) by mouth 3 (three) times daily. 90 capsule 1  . sulfamethoxazole-trimethoprim (BACTRIM DS,SEPTRA DS) 800-160 MG per tablet Take 1 tablet by mouth every 12 (twelve) hours. For 2 weeks. 28 tablet 0  . terbinafine (LAMISIL) 250 MG tablet Take 1 tablet (250 mg total) by mouth daily. 30 tablet 1   No current facility-administered medications on file prior to visit.   Family History  Problem Relation Age of Onset  . Cancer Mother   . Aneurysm Father     brain   Social History   Social History  . Marital Status: Legally Separated    Spouse Name: N/A  . Number of Children: N/A  . Years of Education: N/A   Occupational History  . Not on file.   Social History Main Topics  . Smoking status: Current Every Day Smoker -- 0.25 packs/day    Types: Cigarettes  . Smokeless tobacco: Not on file     Comment: uses a vapor -puff-4-5 x/day  . Alcohol Use: No     Comment: patient states he  quit drinking since he has been sick  . Drug Use: Yes    Special: Marijuana  . Sexual Activity: Not on file   Other Topics Concern  . Not on file   Social History Narrative    Review of Systems: Other than what is stated in HPI, all other systems are negative.   Objective:   Filed Vitals:   04/09/15 1411  BP: 144/94  Pulse: 88  Temp: 98 F (36.7 C)  Resp: 16    Physical Exam  Constitutional: He is oriented to person, place, and time.  Cardiovascular: Normal rate, regular rhythm and normal heart sounds.   Pulmonary/Chest: Effort normal and breath sounds normal.  Musculoskeletal: He exhibits edema  (right great and 3-5 toe swelling).  Right second toe amputated--healed surgical site  Neurological: He is alert and oriented to person, place, and time.  Skin: Skin is warm and dry. No erythema.     Lab Results  Component Value Date   WBC 6.0 03/30/2015   HGB 12.7* 03/30/2015   HCT 37.7* 03/30/2015   MCV 84.2 03/30/2015   PLT 223 03/30/2015   Lab Results  Component Value Date   CREATININE 0.92 03/30/2015   BUN 7 03/30/2015   NA 139 03/30/2015   K 3.9 03/30/2015   CL 108 03/30/2015   CO2 25 03/30/2015    Lab Results  Component Value Date   HGBA1C 8.50 04/09/2015   Lipid Panel     Component Value Date/Time   CHOL 153 11/12/2014 0911   TRIG 118 11/12/2014 0911   HDL 48 11/12/2014 0911   CHOLHDL 3.2 11/12/2014 0911   VLDL 24 11/12/2014 0911   LDLCALC 81 11/12/2014 0911       Assessment and plan:   Ryan Shaw was seen today for follow-up.  Diagnoses and all orders for this visit:  Pain and swelling of toe of right foot -     DG Foot Complete Right; Future----review of the x-ray reveals patient may have early osteomyelitis at the head of the right second metatarsal. I will consult with Dr. Magnus Ivan at [facility] orthopedics and get back with patient on final results -   Begin oxyCODONE-acetaminophen (PERCOCET/ROXICET) 5-325 MG per tablet; Take 1-2 tablets by mouth every 4 (four) hours as needed for severe pain.  DM type 2, uncontrolled, with neuropathy -     Glucose (CBG) -     HgB A1c -     Insulin Glargine (LANTUS SOLOSTAR) 100 UNIT/ML Solostar Pen; Inject 26 Units into the skin daily at 10 pm.  his diabetes remains uncontrolled as evidence by hemoglobin A1c >8. I will have patient increase his Lantus to 26 units nightly. He will continue to record his sugars and bring the log back on follow-up visit with nurse in 2 weeks.  Patient is continuing to take gabapentin until Lyrica is approved by drug company  Elevated BP    If BP is elevated on repeat with RN then I will  likely add Lisinopril 5 mg daily for BP control and kidney protection    Return in about 2 weeks (around 04/23/2015) for Nurse Visit--log review/BP check and 3 mo PCP DM.   Ryan Finland, NP-C [facility] [phone] 04/09/2015, 2:19 PM

## 2015-04-09 NOTE — Patient Instructions (Signed)
Get xray of foot, increase insulin by 2 units per night. Report back in 2 weeks with blood sugar log for review. I will look at the foot again.   Please report to ER if you have discoloration, fever, or chills.

## 2015-04-10 ENCOUNTER — Telehealth: Payer: Self-pay | Admitting: Internal Medicine

## 2015-04-10 NOTE — Telephone Encounter (Signed)
Patient called in regards of his x rays results. Please f/u

## 2015-04-11 ENCOUNTER — Telehealth: Payer: Self-pay

## 2015-04-11 NOTE — Telephone Encounter (Signed)
-----   Message from Ryan Finland, NP sent at 04/09/2015  6:13 PM EDT ----- I have seen his foot xray and I am waiting on a call back from Dr. Magnus Ivan to review it to make sure the infection has not returned. I will call him when I hear back

## 2015-04-11 NOTE — Telephone Encounter (Signed)
Attempted to call patient about his x ray results Patient not available Left message on voice mail to return our call

## 2015-04-17 ENCOUNTER — Other Ambulatory Visit: Payer: Self-pay | Admitting: Internal Medicine

## 2015-04-17 DIAGNOSIS — E1142 Type 2 diabetes mellitus with diabetic polyneuropathy: Secondary | ICD-10-CM

## 2015-04-17 MED ORDER — PREGABALIN 50 MG PO CAPS: 50.0000 mg | ORAL_CAPSULE | Freq: Three times a day (TID) | ORAL | Status: DC

## 2015-04-25 ENCOUNTER — Ambulatory Visit: Payer: Self-pay | Attending: Internal Medicine | Admitting: Pharmacist

## 2015-04-25 ENCOUNTER — Encounter: Payer: Self-pay | Admitting: Pharmacist

## 2015-04-25 VITALS — BP 132/78

## 2015-04-25 DIAGNOSIS — E1165 Type 2 diabetes mellitus with hyperglycemia: Secondary | ICD-10-CM | POA: Insufficient documentation

## 2015-04-25 DIAGNOSIS — IMO0002 Reserved for concepts with insufficient information to code with codable children: Secondary | ICD-10-CM

## 2015-04-25 DIAGNOSIS — E1140 Type 2 diabetes mellitus with diabetic neuropathy, unspecified: Secondary | ICD-10-CM | POA: Insufficient documentation

## 2015-04-25 MED ORDER — INSULIN GLARGINE 100 UNIT/ML SOLOSTAR PEN: 26.0000 [IU] | PEN_INJECTOR | Freq: Every day | SUBCUTANEOUS | Status: DC

## 2015-04-25 MED ORDER — TRAMADOL HCL 50 MG PO TABS: 50.0000 mg | ORAL_TABLET | Freq: Three times a day (TID) | ORAL | Status: DC | PRN

## 2015-04-25 NOTE — Patient Instructions (Addendum)
Thanks for seeing [place] today!  Start taking tramadol  one tablet every 8 hours as needed for pain  Start taking 26 units of lantus once daily.  Check your blood sugar each morning when you wake up for AT LEAST one week. Getting more throughout the day will be helpful as well. Bring your readings/meter to the next appointment.   Follow up in the pharmacy clinic [facility]) in 2 weeks.

## 2015-04-25 NOTE — Progress Notes (Signed)
S:    Patient arrives in a pleasant mood. Presents for diabetes management and BP follow up.   Patient denies adherence with medications. Current diabetes medications include Lantus 26 units daily and metformin  twice daily.  Patient denies hypoglycemic events.  Patient reported dietary habits: Usually eats twice a day (breakfast and dinner). Avoids white breads and sugary drinks. Eats fruit at least 3 times a day.  Patient reported exercise habits: Has not been exercising due to pain.   Patient denies nocturia.  Patient reports neuropathy. Patient denies visual changes. Patient reports self foot exams.    O:  Lab Results  Component Value Date   HGBA1C 8.50 04/09/2015   Patient did not bring BG logs to appointment and admits he has not been checking at all.  A/P:  TYPE II DIABETES: Long-standing diabetes currently uncontrolled. Patient denies hypoglycemic events and is able to verbalize appropriate hypoglycemia management plan. Patient denies adherence with medication. Control is suboptimal due to poor adherence.  Also reports he mistakenly continued using 24 units daily of lantus instead of the recently prescribed 26 units. Reports painful neuropathy and states he is currently out of neurontin and percocet for temporary control while awaiting PAP for lyrica. Contacted pharmacy and was told they are awaiting a copy of his W-2 for further processing. Patient prescribed tramadol  in the interim. Explained to patient that BG logs are vital to successful treatment. Patient agreed to check blood sugar daily each morning for at least one week and to obtain a few values throughout the day. Instructed patient to resume his previously prescribed dose of lantus at 26 units daily. Will return in two weeks with BG logs. Next A1c anticipated December.  HYPERTENSION  BP currently controlled at 132/78. No anti-hypertensives added at today's visit. Will follow up with [facility] as  necessary.  Written patient instructions provided. Total time in face to face counseling 35 minutes. Follow up in Pharmacist Clinic in 2 weeks. Patient seen with Sherle Poe, PharmD. resident.

## 2015-05-07 ENCOUNTER — Other Ambulatory Visit: Payer: Self-pay | Admitting: Internal Medicine

## 2015-05-07 NOTE — Telephone Encounter (Signed)
Patient called requesting to see if he can be prescribed gabapentin (NEURONTIN) 300 MG capsule, while he get his Lyrica.  Patient stated that he has not received his Lyrica yet, it was suppose to come in the mail. Please f/u with pt.

## 2015-05-22 ENCOUNTER — Ambulatory Visit: Payer: Self-pay | Attending: Internal Medicine | Admitting: Internal Medicine

## 2015-05-22 ENCOUNTER — Encounter: Payer: Self-pay | Admitting: Internal Medicine

## 2015-05-22 VITALS — BP 113/77 | HR 109 | Temp 98.4°F | Resp 18 | Ht 78.0 in | Wt 218.0 lb

## 2015-05-22 DIAGNOSIS — E119 Type 2 diabetes mellitus without complications: Secondary | ICD-10-CM

## 2015-05-22 DIAGNOSIS — Z794 Long term (current) use of insulin: Secondary | ICD-10-CM | POA: Insufficient documentation

## 2015-05-22 DIAGNOSIS — F17210 Nicotine dependence, cigarettes, uncomplicated: Secondary | ICD-10-CM | POA: Insufficient documentation

## 2015-05-22 DIAGNOSIS — E1142 Type 2 diabetes mellitus with diabetic polyneuropathy: Secondary | ICD-10-CM | POA: Insufficient documentation

## 2015-05-22 DIAGNOSIS — N529 Male erectile dysfunction, unspecified: Secondary | ICD-10-CM | POA: Insufficient documentation

## 2015-05-22 DIAGNOSIS — N521 Erectile dysfunction due to diseases classified elsewhere: Secondary | ICD-10-CM

## 2015-05-22 DIAGNOSIS — Z7984 Long term (current) use of oral hypoglycemic drugs: Secondary | ICD-10-CM | POA: Insufficient documentation

## 2015-05-22 DIAGNOSIS — Z79899 Other long term (current) drug therapy: Secondary | ICD-10-CM | POA: Insufficient documentation

## 2015-05-22 LAB — GLUCOSE, POCT (MANUAL RESULT ENTRY): POC GLUCOSE: 180 mg/dL — AB (ref 70–99)

## 2015-05-22 LAB — TSH: TSH: 1.492 u[IU]/mL (ref 0.350–4.500)

## 2015-05-22 MED ORDER — PREGABALIN 100 MG PO CAPS: 100.0000 mg | ORAL_CAPSULE | Freq: Three times a day (TID) | ORAL | Status: DC

## 2015-05-22 MED ORDER — DICLOFENAC SODIUM 1 % TD GEL: 4.0000 g | Freq: Three times a day (TID) | TRANSDERMAL | Status: DC | PRN

## 2015-05-22 NOTE — Progress Notes (Signed)
Patient ID: Ryan Shaw, male   DOB: 02/26/1966, 49 y.o.   MRN: [id number]  CC: neuropathy pain  HPI: Ryan Shaw is a 49 y.o. male here today for a follow up visit or neuropathy pain.  Patient has past medical history of diabetes mellitus, neuropathy, and arthritis. Patient reports that he finally received his Lyrica in the mail 2 weeks ago and does not feel like it has been helping his pain at all. Currently he is taking 50 mg three times per day. He states that he stopped taking the Tramadol because he did not feel like it was helping his pain. Patient reports that his friend let him use her Voltaren Gel on his feet and he felt like it really helped his pain for several hours. He is requesting a script for Voltaren.  Patient is also concerned about erectile dysfunction. He states that for the past couple of months he has been having difficulty getting an erection. He would like to have a prescription for Viagra.  Patient has No headache, No chest pain, No abdominal pain - No Nausea, No Cough - SOB.  Allergies  Allergen Reactions  . Other Nausea And Vomiting    NeuRemedy - Dietary Supplement   Past Medical History  Diagnosis Date  . Diabetes mellitus without complication (HCC)   . Neuropathy (HCC)   . Arthritis    Current Outpatient Prescriptions on File Prior to Visit  Medication Sig Dispense Refill  . ibuprofen (ADVIL,MOTRIN) 600 MG tablet Take 1 tablet (600 mg total) by mouth every 6 (six) hours as needed. (Patient taking differently: Take 600 mg by mouth at bedtime as needed (pain). ) 30 tablet 0  . Insulin Glargine (LANTUS SOLOSTAR) 100 UNIT/ML Solostar Pen Inject 26 Units into the skin daily at 10 pm. 30 mL 6  . metFORMIN (GLUCOPHAGE) 1000 MG tablet Take 1 tablet (1,000 mg total) by mouth 2 (two) times daily with a meal. New dose--Take 1 pill twice per day 60 tablet 4  . traMADol (ULTRAM) 50 MG tablet Take 1 tablet (50 mg total) by mouth every 8 (eight) hours as needed. 90  tablet 0  . methocarbamol (ROBAXIN-750) 750 MG tablet Take 1 tablet (750 mg total) by mouth 4 (four) times daily. (Patient not taking: Reported on 04/25/2015) 30 tablet 0  . oxyCODONE-acetaminophen (PERCOCET/ROXICET) 5-325 MG per tablet Take 1-2 tablets by mouth every 4 (four) hours as needed for severe pain. (Patient not taking: Reported on 04/25/2015) 60 tablet 0  . sulfamethoxazole-trimethoprim (BACTRIM DS,SEPTRA DS) 800-160 MG per tablet Take 1 tablet by mouth every 12 (twelve) hours. For 2 weeks. (Patient not taking: Reported on 04/25/2015) 28 tablet 0  . terbinafine (LAMISIL) 250 MG tablet Take 1 tablet (250 mg total) by mouth daily. (Patient not taking: Reported on 04/25/2015) 30 tablet 1   No current facility-administered medications on file prior to visit.   Family History  Problem Relation Age of Onset  . Cancer Mother   . Aneurysm Father     brain   Social History   Social History  . Marital Status: Legally Separated    Spouse Name: N/A  . Number of Children: N/A  . Years of Education: N/A   Occupational History  . Not on file.   Social History Main Topics  . Smoking status: Current Every Day Smoker -- 0.25 packs/day    Types: Cigarettes  . Smokeless tobacco: Not on file     Comment: uses a vapor -puff-4-5 x/day  . Alcohol  Use: No     Comment: patient states he quit drinking since he has been sick  . Drug Use: Yes    Special: Marijuana  . Sexual Activity: Not on file   Other Topics Concern  . Not on file   Social History Narrative    Review of Systems: Other than what is stated in HPI, all other systems are negative.   Objective:   Filed Vitals:   05/22/15 1154  BP: 113/77  Pulse: 109  Temp: 98.4 F (36.9 C)  Resp: 18    Physical Exam  Constitutional: He is oriented to person, place, and time.  Cardiovascular: Normal rate, regular rhythm and normal heart sounds.   Pulmonary/Chest: Effort normal and breath sounds normal.  Feet:  Right Foot:  Skin  Integrity: Negative for skin breakdown.  Left Foot:  Skin Integrity: Negative for skin breakdown.  Neurological: He is alert and oriented to person, place, and time.  Skin: Skin is warm and dry.  Psychiatric: He has a normal mood and affect.     Lab Results  Component Value Date   WBC 6.0 03/30/2015   HGB 12.7* 03/30/2015   HCT 37.7* 03/30/2015   MCV 84.2 03/30/2015   PLT 223 03/30/2015   Lab Results  Component Value Date   CREATININE 0.92 03/30/2015   BUN 7 03/30/2015   NA 139 03/30/2015   K 3.9 03/30/2015   CL 108 03/30/2015   CO2 25 03/30/2015    Lab Results  Component Value Date   HGBA1C 8.50 04/09/2015   Lipid Panel     Component Value Date/Time   CHOL 153 11/12/2014 0911   TRIG 118 11/12/2014 0911   HDL 48 11/12/2014 0911   CHOLHDL 3.2 11/12/2014 0911   VLDL 24 11/12/2014 0911   LDLCALC 81 11/12/2014 0911       Assessment and plan:   Ryan Shaw was seen today for foot pain.  Diagnoses and all orders for this visit:  Diabetic polyneuropathy associated with type 2 diabetes mellitus (HCC) -     pregabalin (LYRICA) 100 MG capsule; Take 1 capsule (100 mg total) by mouth 3 (three) times daily. -     diclofenac sodium (VOLTAREN) 1 % GEL; Apply 4 g topically 3 (three) times daily as needed. I have increased dose of Lyrica to 100 mg TID for better control. I have also sent Voltaren to use with Lyrica. I have explained to patient that at this point he will need pain management clinic but he is uninsured.   Type 2 diabetes mellitus without complication, with long-term current use of insulin (HCC) -     Glucose (CBG) Stable  Erectile dysfunction due to diseases classified elsewhere -     Testosterone -     TSH Will send viagra if testosterone is normal. I have explained how diabetes and several medications can affect sexual function. Patient verbalized understanding.  Follow up as previously directed.       Ambrose Finland, NP-C [facility] [phone] 05/22/2015, 12:46 PM

## 2015-05-22 NOTE — Patient Instructions (Signed)

## 2015-05-22 NOTE — Progress Notes (Signed)
Pt here for foot pain in both feet. Pt reports pain in both feet rated at a 10 described as dull sharp pain. Pain has been constant for the last two days and has caused the patient to be unable to sleep. Pt states nothing really helps. Pt states he used some type of cream his friend had and that helped relieve the pain a little. Pt reports concern of something located on the bottom of his 4th toe on his left foot. Pt has taken his morning medications today. Pt smokes .25 packs of cigarettes/day.

## 2015-05-23 ENCOUNTER — Other Ambulatory Visit: Payer: Self-pay | Admitting: Internal Medicine

## 2015-05-23 DIAGNOSIS — N521 Erectile dysfunction due to diseases classified elsewhere: Secondary | ICD-10-CM

## 2015-05-23 LAB — TESTOSTERONE: Testosterone: 475 ng/dL (ref 300–890)

## 2015-05-23 MED ORDER — SILDENAFIL CITRATE 25 MG PO TABS: 25.0000 mg | ORAL_TABLET | Freq: Every day | ORAL | Status: DC | PRN

## 2015-05-24 ENCOUNTER — Telehealth: Payer: Self-pay

## 2015-05-24 NOTE — Telephone Encounter (Signed)
-----   Message from Ambrose FinlandValerie A Keck, NP sent at 05/23/2015  9:26 PM EDT ----- Testosterone is normal. I will send low dose Viagra to our pharmacy.

## 2015-05-24 NOTE — Telephone Encounter (Signed)
Spoke with patient and he is aware his testosterone level is normal and To pick up his prescription for viagra at the pharmacy here

## 2015-05-30 ENCOUNTER — Telehealth: Payer: Self-pay | Admitting: Internal Medicine

## 2015-05-30 NOTE — Telephone Encounter (Signed)
Patient dropped off paperwork to be completed by doctor. ° °

## 2015-05-31 ENCOUNTER — Ambulatory Visit: Payer: No Typology Code available for payment source | Admitting: Podiatry

## 2015-06-03 NOTE — Telephone Encounter (Signed)
Pt. Called requesting to know the status of his paperwork.

## 2015-06-03 NOTE — Telephone Encounter (Signed)
Paperwork is complete. I will fax copies and we can have the originals ready for him to pick up so he can have a copy for his records

## 2015-06-04 ENCOUNTER — Telehealth: Payer: Self-pay

## 2015-06-04 NOTE — Telephone Encounter (Signed)
Returned patient phone call Patient is aware his paper work is done and will be faxed Original will be at the front desk  For him to pick up

## 2015-06-06 ENCOUNTER — Encounter (HOSPITAL_BASED_OUTPATIENT_CLINIC_OR_DEPARTMENT_OTHER): Payer: Self-pay

## 2015-06-07 ENCOUNTER — Other Ambulatory Visit: Payer: Self-pay | Admitting: Internal Medicine

## 2015-06-07 DIAGNOSIS — N521 Erectile dysfunction due to diseases classified elsewhere: Secondary | ICD-10-CM

## 2015-06-07 MED ORDER — SILDENAFIL CITRATE 25 MG PO TABS: 25.0000 mg | ORAL_TABLET | Freq: Every day | ORAL | Status: AC | PRN

## 2015-06-12 DIAGNOSIS — L601 Onycholysis: Secondary | ICD-10-CM

## 2015-06-14 ENCOUNTER — Other Ambulatory Visit: Payer: Self-pay | Admitting: Internal Medicine

## 2015-06-14 DIAGNOSIS — E1142 Type 2 diabetes mellitus with diabetic polyneuropathy: Secondary | ICD-10-CM

## 2015-06-14 DIAGNOSIS — N521 Erectile dysfunction due to diseases classified elsewhere: Secondary | ICD-10-CM

## 2015-06-14 MED ORDER — PREGABALIN 100 MG PO CAPS: 100.0000 mg | ORAL_CAPSULE | Freq: Three times a day (TID) | ORAL | Status: DC

## 2015-06-17 ENCOUNTER — Ambulatory Visit (HOSPITAL_COMMUNITY)
Admission: RE | Admit: 2015-06-17 | Discharge: 2015-06-17 | Disposition: A | Discharge status: Home / Self Care | Payer: Self-pay | Source: Ambulatory Visit | Attending: Internal Medicine | Admitting: Internal Medicine

## 2015-06-17 ENCOUNTER — Encounter: Payer: Self-pay | Admitting: Internal Medicine

## 2015-06-17 ENCOUNTER — Ambulatory Visit: Payer: Self-pay | Attending: Internal Medicine | Admitting: Internal Medicine

## 2015-06-17 VITALS — BP 140/90 | HR 92 | Temp 98.0°F | Resp 16 | Wt 227.8 lb

## 2015-06-17 DIAGNOSIS — G8929 Other chronic pain: Secondary | ICD-10-CM | POA: Insufficient documentation

## 2015-06-17 DIAGNOSIS — M170 Bilateral primary osteoarthritis of knee: Secondary | ICD-10-CM | POA: Insufficient documentation

## 2015-06-17 DIAGNOSIS — M25561 Pain in right knee: Secondary | ICD-10-CM | POA: Insufficient documentation

## 2015-06-17 DIAGNOSIS — M25562 Pain in left knee: Secondary | ICD-10-CM | POA: Insufficient documentation

## 2015-06-17 DIAGNOSIS — E119 Type 2 diabetes mellitus without complications: Secondary | ICD-10-CM | POA: Insufficient documentation

## 2015-06-17 LAB — GLUCOSE, POCT (MANUAL RESULT ENTRY): POC GLUCOSE: 161 mg/dL — AB (ref 70–99)

## 2015-06-17 MED ORDER — NAPROXEN 500 MG PO TABS: 500.0000 mg | ORAL_TABLET | Freq: Two times a day (BID) | ORAL | Status: DC

## 2015-06-17 NOTE — Patient Instructions (Signed)
You will come back with a different provider who will do your knee injections

## 2015-06-17 NOTE — Progress Notes (Signed)
Patient ID: Ryan Shaw, male   DOB: Nov 03, 1965, 49 y.o.   MRN: [id number]  CC: knee pain   HPI: Ryan Shaw is a 49 y.o. male here today for a follow up visit.  Patient has past medical history of diabetes, neuropathy, and arthritis. Patient reports that within the past two weeks he has been having weakness in his knees that has caused him to fall. He states that the pain is in bilateral knees and is achy. Pain is aggravated by walking and climbing stairs. He has tried ibuprofen and Tramadol for pain. He denies swelling, redness, or warmth of the knees.  Patient reports due to his severe neuropathy pain he has been dealing with some depression. He recently went to [facility] and was treated for depression. He states that his biggest problem is not being able to work due to the chronic pain. He notes some relief in pain with the higher dose of Lyrica.    Allergies  Allergen Reactions  . Other Nausea And Vomiting    NeuRemedy - Dietary Supplement   Past Medical History  Diagnosis Date  . Diabetes mellitus without complication (HCC)   . Neuropathy (HCC)   . Arthritis    Current Outpatient Prescriptions on File Prior to Visit  Medication Sig Dispense Refill  . diclofenac sodium (VOLTAREN) 1 % GEL Apply 4 g topically 3 (three) times daily as needed. 100 g 4  . ibuprofen (ADVIL,MOTRIN) 600 MG tablet Take 1 tablet (600 mg total) by mouth every 6 (six) hours as needed. (Patient taking differently: Take 600 mg by mouth at bedtime as needed (pain). ) 30 tablet 0  . Insulin Glargine (LANTUS SOLOSTAR) 100 UNIT/ML Solostar Pen Inject 26 Units into the skin daily at 10 pm. 30 mL 6  . metFORMIN (GLUCOPHAGE) 1000 MG tablet Take 1 tablet (1,000 mg total) by mouth 2 (two) times daily with a meal. New dose--Take 1 pill twice per day 60 tablet 4  . pregabalin (LYRICA) 100 MG capsule Take 1 capsule (100 mg total) by mouth 3 (three) times daily. 270 capsule 3  . sildenafil (VIAGRA) 25 MG tablet Take 1 tablet  (25 mg total) by mouth daily as needed for erectile dysfunction. 30 tablet 3  . traMADol (ULTRAM) 50 MG tablet Take 1 tablet (50 mg total) by mouth every 8 (eight) hours as needed. 90 tablet 0  . methocarbamol (ROBAXIN-750) 750 MG tablet Take 1 tablet (750 mg total) by mouth 4 (four) times daily. (Patient not taking: Reported on 04/25/2015) 30 tablet 0  . oxyCODONE-acetaminophen (PERCOCET/ROXICET) 5-325 MG per tablet Take 1-2 tablets by mouth every 4 (four) hours as needed for severe pain. (Patient not taking: Reported on 04/25/2015) 60 tablet 0  . sulfamethoxazole-trimethoprim (BACTRIM DS,SEPTRA DS) 800-160 MG per tablet Take 1 tablet by mouth every 12 (twelve) hours. For 2 weeks. (Patient not taking: Reported on 04/25/2015) 28 tablet 0  . terbinafine (LAMISIL) 250 MG tablet Take 1 tablet (250 mg total) by mouth daily. (Patient not taking: Reported on 04/25/2015) 30 tablet 1   No current facility-administered medications on file prior to visit.   Family History  Problem Relation Age of Onset  . Cancer Mother   . Aneurysm Father     brain   Social History   Social History  . Marital Status: Legally Separated    Spouse Name: N/A  . Number of Children: N/A  . Years of Education: N/A   Occupational History  . Not on file.   Social  History Main Topics  . Smoking status: Current Every Day Smoker -- 0.25 packs/day    Types: Cigarettes  . Smokeless tobacco: Not on file     Comment: uses a vapor -puff-4-5 x/day  . Alcohol Use: No     Comment: patient states he quit drinking since he has been sick  . Drug Use: Yes    Special: Marijuana  . Sexual Activity: Not on file   Other Topics Concern  . Not on file   Social History Narrative    Review of Systems: Other than what is stated in HPI, all other systems are negative.   Objective:   Filed Vitals:   06/17/15 0958  Pulse: 92  Temp: 98 F (36.7 C)  Resp: 16    Physical Exam  Constitutional: He is oriented to person, place, and  time.  Musculoskeletal:       Right knee: He exhibits no swelling and no effusion. Tenderness found.       Left knee: He exhibits no swelling and no effusion. Tenderness found.  Pain/crepitus with ROM  Neurological: He is alert and oriented to person, place, and time.  Skin: Skin is dry. No erythema.     Lab Results  Component Value Date   WBC 6.0 03/30/2015   HGB 12.7* 03/30/2015   HCT 37.7* 03/30/2015   MCV 84.2 03/30/2015   PLT 223 03/30/2015   Lab Results  Component Value Date   CREATININE 0.92 03/30/2015   BUN 7 03/30/2015   NA 139 03/30/2015   K 3.9 03/30/2015   CL 108 03/30/2015   CO2 25 03/30/2015    Lab Results  Component Value Date   HGBA1C 8.50 04/09/2015   Lipid Panel     Component Value Date/Time   CHOL 153 11/12/2014 0911   TRIG 118 11/12/2014 0911   HDL 48 11/12/2014 0911   CHOLHDL 3.2 11/12/2014 0911   VLDL 24 11/12/2014 0911   LDLCALC 81 11/12/2014 0911       Assessment and plan:   Ryan Shaw was seen today for knee pain.  Diagnoses and all orders for this visit:  Osteoarthritis of both knees, unspecified osteoarthritis type -     DG Knee Bilateral Standing AP; Future -     naproxen (NAPROSYN) 500 MG tablet; Take 1 tablet (500 mg total) by mouth 2 (two) times daily with a meal. Will reschedule patient with Dr. Armen PickupFunches for joint injections. Stressed that patient reapply for hospital discount so that he can go back to Orthopedics if needed  Type 2 diabetes mellitus without complication, without long-term current use of insulin (HCC) -     Glucose (CBG) stable  Return in about 1 week (around 06/24/2015) for with Dr. Renda RollsFunchess--knee injection and January PCP DM/HTN.       Ambrose FinlandValerie A Keck, NP-C [facility] [phone] 06/17/2015, 10:10 AM

## 2015-06-17 NOTE — Progress Notes (Signed)
Patient complains of having bilateral knee pain Patient stated he knees have given out twice in the past two weeks

## 2015-06-18 ENCOUNTER — Telehealth: Payer: Self-pay

## 2015-06-18 NOTE — Telephone Encounter (Signed)
Spoke with patient and he is aware of his xray results

## 2015-06-18 NOTE — Telephone Encounter (Signed)
-----   Message from Ambrose FinlandValerie A Keck, NP sent at 06/17/2015  3:27 PM EST ----- xrays are normal. Provider needed xrays before joint injections

## 2015-07-09 ENCOUNTER — Encounter: Payer: Self-pay | Admitting: Family Medicine

## 2015-07-09 ENCOUNTER — Ambulatory Visit: Payer: Self-pay | Attending: Family Medicine | Admitting: Family Medicine

## 2015-07-09 VITALS — BP 120/79 | HR 90 | Temp 98.1°F | Resp 16 | Ht 78.0 in | Wt 223.0 lb

## 2015-07-09 DIAGNOSIS — E1165 Type 2 diabetes mellitus with hyperglycemia: Secondary | ICD-10-CM | POA: Insufficient documentation

## 2015-07-09 DIAGNOSIS — M25562 Pain in left knee: Secondary | ICD-10-CM | POA: Insufficient documentation

## 2015-07-09 DIAGNOSIS — F17210 Nicotine dependence, cigarettes, uncomplicated: Secondary | ICD-10-CM | POA: Insufficient documentation

## 2015-07-09 DIAGNOSIS — Z794 Long term (current) use of insulin: Secondary | ICD-10-CM | POA: Insufficient documentation

## 2015-07-09 DIAGNOSIS — Z7984 Long term (current) use of oral hypoglycemic drugs: Secondary | ICD-10-CM | POA: Insufficient documentation

## 2015-07-09 DIAGNOSIS — G8929 Other chronic pain: Secondary | ICD-10-CM | POA: Insufficient documentation

## 2015-07-09 DIAGNOSIS — IMO0002 Reserved for concepts with insufficient information to code with codable children: Secondary | ICD-10-CM

## 2015-07-09 DIAGNOSIS — E1140 Type 2 diabetes mellitus with diabetic neuropathy, unspecified: Secondary | ICD-10-CM | POA: Insufficient documentation

## 2015-07-09 DIAGNOSIS — M25561 Pain in right knee: Secondary | ICD-10-CM | POA: Insufficient documentation

## 2015-07-09 LAB — GLUCOSE, POCT (MANUAL RESULT ENTRY): POC Glucose: 165 mg/dl — AB (ref 70–99)

## 2015-07-09 MED ORDER — METHYLPREDNISOLONE ACETATE 40 MG/ML IJ SUSP
40.0000 mg | Freq: Once | INTRAMUSCULAR | Status: AC
  Administered 2015-07-09: 40 mg via INTRA_ARTICULAR

## 2015-07-09 NOTE — Patient Instructions (Addendum)
Ryan Shaw was seen today for knee pain.  Diagnoses and all orders for this visit:  DM type 2, uncontrolled, with neuropathy (HCC) -     POCT glucose (manual entry)  Bilateral chronic knee pain -     methylPREDNISolone acetate (DEPO-MEDROL) injection 40 mg; Inject 1 mL (40 mg total) into the articular space once.   You have received a shot of steroid in your R knee today. Rest and ice knee today. Regular activity tomorrow. Look out for redness, swelling, fever,severe pain in joint and call if you experience these symptoms.  F/u in 2-3 weeks for L knee injection with me, Dr. Armen PickupFunches F.u with PCP Dr. Luna GlasgowKeck in 4-6 weeks for diabetes f/u   Dr. Armen PickupFunches

## 2015-07-09 NOTE — Progress Notes (Signed)
Pt present for knee iinj Pain in both knee worst on Rt  Quit tobacco 1 wk ago No suicidal thoughts in the past two weeks Pain scale #8

## 2015-07-09 NOTE — Progress Notes (Signed)
Patient ID: Ryan Shaw, male   DOB: 04/07/1966, 49 y.o.   MRN: [id number]   Subjective:  Patient ID: Ryan Shaw, male    DOB: 12/15/1965  Age: 49 y.o. MRN: [id number]  CC: Knee Pain   HPI Ryan Shaw presents for    1. R knee injection: he has chronic b/l knee pain that is worse on the R. He has gone for bilateral standing knee x-rays. His PCP referred him for corticosteroid injections. He report medial and lateral joint line tenderness. No swelling or deformity. He reports working for many years in [employer].   Social History  Substance Use Topics  . Smoking status: Current Every Day Smoker -- 0.25 packs/day    Types: Cigarettes  . Smokeless tobacco: Not on file     Comment: uses a vapor -puff-4-5 x/day  . Alcohol Use: No     Comment: patient states he quit drinking since he has been sick    Outpatient Prescriptions Prior to Visit  Medication Sig Dispense Refill  . diclofenac sodium (VOLTAREN) 1 % GEL Apply 4 g topically 3 (three) times daily as needed. 100 g 4  . DULoxetine (CYMBALTA) 30 MG capsule Take 30 mg by mouth daily.    . Insulin Glargine (LANTUS SOLOSTAR) 100 UNIT/ML Solostar Pen Inject 26 Units into the skin daily at 10 pm. 30 mL 6  . metFORMIN (GLUCOPHAGE) 1000 MG tablet Take 1 tablet (1,000 mg total) by mouth 2 (two) times daily with a meal. New dose--Take 1 pill twice per day 60 tablet 4  . naproxen (NAPROSYN) 500 MG tablet Take 1 tablet (500 mg total) by mouth 2 (two) times daily with a meal. 30 tablet 0  . prazosin (MINIPRESS) 1 MG capsule Take 1 mg by mouth at bedtime.    . pregabalin (LYRICA) 100 MG capsule Take 1 capsule (100 mg total) by mouth 3 (three) times daily. 270 capsule 3  . QUEtiapine (SEROQUEL) 100 MG tablet Take 100 mg by mouth at bedtime.    . sildenafil (VIAGRA) 25 MG tablet Take 1 tablet (25 mg total) by mouth daily as needed for erectile dysfunction. 30 tablet 3  . sulfamethoxazole-trimethoprim (BACTRIM DS,SEPTRA DS) 800-160 MG per  tablet Take 1 tablet by mouth every 12 (twelve) hours. For 2 weeks. 28 tablet 0  . traMADol (ULTRAM) 50 MG tablet Take 1 tablet (50 mg total) by mouth every 8 (eight) hours as needed. 90 tablet 0   No facility-administered medications prior to visit.    ROS Review of Systems  Constitutional: Negative for fever and chills.  Musculoskeletal: Positive for arthralgias (knees).    Objective:  BP 120/79 mmHg  Pulse 90  Temp(Src) 98.1 F (36.7 C) (Oral)  Resp 16  Ht 6\' 6"  (1.981 m)  Wt 223 lb (101.152 kg)  BMI 25.78 kg/m2  SpO2 100%  BP/Weight 07/09/2015 06/17/2015 05/22/2015  Systolic BP 120 140 113  Diastolic BP 79 90 77  Wt. (Lbs) 223 227.8 218  BMI 25.78 26.33 25.2    Physical Exam  Constitutional: He appears well-developed and well-nourished. No distress.  HENT:  Head: Normocephalic and atraumatic.  Neck: Normal range of motion. Neck supple.  Pulmonary/Chest: Effort normal.  Musculoskeletal: Normal range of motion. He exhibits no edema.       Right knee: He exhibits normal range of motion, no swelling, no effusion, no ecchymosis, no deformity, no laceration and no erythema. Tenderness found. Medial joint line and lateral joint line tenderness noted. No patellar tendon tenderness noted.  Left knee: He exhibits normal range of motion, no swelling, no effusion, no ecchymosis, no deformity, no laceration and no erythema. Tenderness found. Medial joint line and lateral joint line tenderness noted.  Skin: Skin is dry. No rash noted. No erythema.  Psychiatric: He has a normal mood and affect.   After obtaining informed consent and cleaning the skin using iodine and alcohol a  steroid injection was performed at R knee using 1% plain Lidocaine and 40 mg of Depo Medrol. 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  Diagnoses and all orders for this visit:  DM type  2, uncontrolled, with neuropathy (HCC) -     POCT glucose (manual entry)  Bilateral chronic knee pain -     methylPREDNISolone acetate (DEPO-MEDROL) injection 40 mg; Inject 1 mL (40 mg total) into the articular space once.    No orders of the defined types were placed in this encounter.    Follow-up: No Follow-up on file.   Dessa Phi MD

## 2015-08-01 ENCOUNTER — Encounter (HOSPITAL_BASED_OUTPATIENT_CLINIC_OR_DEPARTMENT_OTHER): Payer: Self-pay | Admitting: Clinical

## 2015-08-01 ENCOUNTER — Encounter: Payer: Self-pay | Admitting: Internal Medicine

## 2015-08-01 ENCOUNTER — Ambulatory Visit (HOSPITAL_BASED_OUTPATIENT_CLINIC_OR_DEPARTMENT_OTHER): Payer: Self-pay | Admitting: Internal Medicine

## 2015-08-01 ENCOUNTER — Encounter: Payer: Self-pay | Admitting: Family Medicine

## 2015-08-01 ENCOUNTER — Ambulatory Visit: Payer: Self-pay | Attending: Family Medicine | Admitting: Family Medicine

## 2015-08-01 VITALS — BP 116/77 | HR 89 | Temp 98.0°F | Resp 16 | Ht 78.0 in | Wt 230.0 lb

## 2015-08-01 VITALS — BP 116/77 | HR 89 | Temp 98.1°F | Resp 16 | Ht 78.0 in | Wt 230.0 lb

## 2015-08-01 DIAGNOSIS — K0889 Other specified disorders of teeth and supporting structures: Secondary | ICD-10-CM | POA: Insufficient documentation

## 2015-08-01 DIAGNOSIS — E1142 Type 2 diabetes mellitus with diabetic polyneuropathy: Secondary | ICD-10-CM

## 2015-08-01 DIAGNOSIS — Z659 Problem related to unspecified psychosocial circumstances: Secondary | ICD-10-CM

## 2015-08-01 DIAGNOSIS — Z7984 Long term (current) use of oral hypoglycemic drugs: Secondary | ICD-10-CM | POA: Insufficient documentation

## 2015-08-01 DIAGNOSIS — M25561 Pain in right knee: Secondary | ICD-10-CM

## 2015-08-01 DIAGNOSIS — G8929 Other chronic pain: Secondary | ICD-10-CM

## 2015-08-01 DIAGNOSIS — F17210 Nicotine dependence, cigarettes, uncomplicated: Secondary | ICD-10-CM | POA: Insufficient documentation

## 2015-08-01 DIAGNOSIS — Z794 Long term (current) use of insulin: Secondary | ICD-10-CM | POA: Insufficient documentation

## 2015-08-01 DIAGNOSIS — M25562 Pain in left knee: Secondary | ICD-10-CM

## 2015-08-01 LAB — GLUCOSE, POCT (MANUAL RESULT ENTRY): POC GLUCOSE: 155 mg/dL — AB (ref 70–99)

## 2015-08-01 LAB — POCT GLYCOSYLATED HEMOGLOBIN (HGB A1C): HEMOGLOBIN A1C: 7.6

## 2015-08-01 MED ORDER — METFORMIN HCL 1000 MG PO TABS: 1000.0000 mg | ORAL_TABLET | Freq: Two times a day (BID) | ORAL | Status: DC

## 2015-08-01 MED ORDER — GLUCOSE BLOOD VI STRP: ORAL_STRIP | Status: DC

## 2015-08-01 MED ORDER — INSULIN GLARGINE 100 UNIT/ML SOLOSTAR PEN: 26.0000 [IU] | PEN_INJECTOR | Freq: Every day | SUBCUTANEOUS | Status: DC

## 2015-08-01 MED ORDER — METHYLPREDNISOLONE ACETATE 40 MG/ML IJ SUSP
40.0000 mg | Freq: Once | INTRAMUSCULAR | Status: AC
  Administered 2015-08-01: 40 mg via INTRA_ARTICULAR

## 2015-08-01 MED ORDER — TRUE METRIX METER W/DEVICE KIT: PACK | Status: DC

## 2015-08-01 MED ORDER — TRUEPLUS LANCETS 28G MISC: Status: DC

## 2015-08-01 MED ORDER — TRAMADOL HCL 50 MG PO TABS
50.0000 mg | ORAL_TABLET | Freq: Three times a day (TID) | ORAL | Status: DC | PRN
Start: 2015-08-01 — End: 2016-11-16

## 2015-08-01 NOTE — Progress Notes (Signed)
Patient ID: Ryan Shaw, male   DOB: 03/10/1966, 49 y.o.   MRN: [id number] Patient ID: Ryan ClockWilber Fuqua, male   DOB: 03/26/1966, 49 y.o.   MRN: [id number]   Subjective:  Patient ID: Ryan ClockWilber Lozada, male    DOB: 08/16/1965  Age: 49 y.o. MRN: [id number]  CC: No chief complaint on file.   HPI Ryan ClockWilber Devaux presents for    1. L knee injection: he has chronic b/l knee pain that is worse on the R. He has gone for bilateral standing knee x-rays. His PCP referred him for corticosteroid injections. He had R knee injection 3 weeks ago. He reports improvement in his R knee pain since injection.   Social History  Substance Use Topics  . Smoking status: Current Every Day Smoker -- 0.25 packs/day    Types: Cigarettes  . Smokeless tobacco: Not on file     Comment: uses a vapor -puff-4-5 x/day  . Alcohol Use: No     Comment: patient states he quit drinking since he has been sick    Outpatient Prescriptions Prior to Visit  Medication Sig Dispense Refill  . diclofenac sodium (VOLTAREN) 1 % GEL Apply 4 g topically 3 (three) times daily as needed. 100 g 4  . DULoxetine (CYMBALTA) 30 MG capsule Take 30 mg by mouth daily.    . Insulin Glargine (LANTUS SOLOSTAR) 100 UNIT/ML Solostar Pen Inject 26 Units into the skin daily at 10 pm. 30 mL 6  . metFORMIN (GLUCOPHAGE) 1000 MG tablet Take 1 tablet (1,000 mg total) by mouth 2 (two) times daily with a meal. New dose--Take 1 pill twice per day 60 tablet 4  . naproxen (NAPROSYN) 500 MG tablet Take 1 tablet (500 mg total) by mouth 2 (two) times daily with a meal. 30 tablet 0  . prazosin (MINIPRESS) 1 MG capsule Take 1 mg by mouth at bedtime.    . pregabalin (LYRICA) 100 MG capsule Take 1 capsule (100 mg total) by mouth 3 (three) times daily. 270 capsule 3  . QUEtiapine (SEROQUEL) 100 MG tablet Take 100 mg by mouth at bedtime.    . sildenafil (VIAGRA) 25 MG tablet Take 1 tablet (25 mg total) by mouth daily as needed for erectile dysfunction. 30 tablet 3  .  sulfamethoxazole-trimethoprim (BACTRIM DS,SEPTRA DS) 800-160 MG per tablet Take 1 tablet by mouth every 12 (twelve) hours. For 2 weeks. 28 tablet 0  . traMADol (ULTRAM) 50 MG tablet Take 1 tablet (50 mg total) by mouth every 8 (eight) hours as needed. 90 tablet 0   No facility-administered medications prior to visit.    ROS Review of Systems  Constitutional: Negative for fever and chills.  Musculoskeletal: Positive for arthralgias (knees).    Objective:  BP 116/77 mmHg  Pulse 89  Temp(Src) 98.1 F (36.7 C) (Oral)  Resp 16  Ht 6\' 6"  (1.981 m)  Wt 230 lb (104.327 kg)  BMI 26.58 kg/m2  SpO2 98%  BP/Weight 08/01/2015 07/09/2015 06/17/2015  Systolic BP 116 120 140  Diastolic BP 77 79 90  Wt. (Lbs) 230 223 227.8  BMI 26.58 25.78 26.33    Physical Exam  Constitutional: He appears well-developed and well-nourished. No distress.  HENT:  Head: Normocephalic and atraumatic.  Neck: Normal range of motion. Neck supple.  Pulmonary/Chest: Effort normal.  Musculoskeletal: Normal range of motion. He exhibits no edema.       Right knee: He exhibits normal range of motion, no swelling, no effusion, no ecchymosis, no deformity, no laceration and no erythema.  Tenderness found. Medial joint line and lateral joint line tenderness noted. No patellar tendon tenderness noted.       Left knee: He exhibits normal range of motion, no swelling, no effusion, no ecchymosis, no deformity, no laceration and no erythema. Tenderness found. Medial joint line and lateral joint line tenderness noted.  Skin: Skin is dry. No rash noted. No erythema.  Psychiatric: He has a normal mood and affect.   After obtaining informed consent and cleaning the skin using iodine and alcohol a  steroid injection was performed at L knee using 1% plain Lidocaine and 40 mg of Depo Medrol. This was well tolerated  Lab Results  Component Value Date   HGBA1C 8.50 04/09/2015   CBG 165 Assessment & Plan:   Diagnoses and all orders  for this visit:  Bilateral chronic knee pain -     methylPREDNISolone acetate (DEPO-MEDROL) injection 40 mg; Inject 1 mL (40 mg total) into the articular space once.    Meds ordered this encounter  Medications  . methylPREDNISolone acetate (DEPO-MEDROL) injection 40 mg    Sig:     Follow-up: No Follow-up on file.   Dessa Phi MD

## 2015-08-01 NOTE — Progress Notes (Signed)
Patient here for follow up on his diabetes Patient also requesting a refill on his tramadol 

## 2015-08-01 NOTE — Progress Notes (Signed)
Pt here for knee INJ  Pain scale #9  Former smoker, stopped one month ago  No suicidal thought in the past two weeks

## 2015-08-01 NOTE — Progress Notes (Signed)
ASSESSMENT: Pt currently experiencing psychosocial stressors. Pt needs to f/u with PCP and would benefit from community resources; may benefit from supportive counseling regarding coping with psychosocial circumstances.  Stage of Change: contemplative  PLAN: 1. F/U with behavioral health consultant in as needed 2. Psychiatric Medications: seroquel, cymbalta. 3. Behavioral recommendation(s):   -Consider [facility] [place] in [place] on 08-19-14 -Follow vision recommendations after vision screening SUBJECTIVE: Pt. referred by Dr Luna GlasgowKeck for community resources, psychosocial :  Pt. reports the following symptoms/concerns: Pt states that he needs to find out about obtaining vision care while uninsured. [facility]; diabetes and financial difficulties.  Duration of problem: At least a week Severity: mild  OBJECTIVE: Orientation & Cognition: Oriented x3. Thought processes normal and appropriate to situation. Mood: appropriate. Affect: appropriate Appearance: appropriate Risk of harm to self or others: no known risk of harm to self or others Substance use: tobacco Assessments administered: none  Diagnosis: Problem related to psychosocial circumstances CPT Code: Z65.9 -------------------------------------------- Other(s) present in the room: none  Time spent with patient in exam room: 5 minutes

## 2015-08-01 NOTE — Patient Instructions (Addendum)
Diagnoses and all orders for this visit:  Bilateral chronic knee pain -     methylPREDNISolone acetate (DEPO-MEDROL) injection 40 mg; Inject 1 mL (40 mg total) into the articular space once.  You have received a shot of steroid in your L knee today. Rest and ice knee today. Regular activity tomorrow. Look out for redness, swelling, fever,severe pain in joint and call if you experience these symptoms.   F.u with PCP Dr. Luna GlasgowKeck in 4-6 weeks for diabetes f/u   Dr. Armen PickupFunches

## 2015-08-01 NOTE — Progress Notes (Signed)
Patient ID: Kariem Wolfson, male   DOB: [birth year], 49 y.o.   MRN: [id number] SUBJECTIVE: 49 y.o. male for follow up of diabetes. Diabetic Review of Systems - medication compliance: compliant all of the time, diabetic diet compliance: compliant all of the time, home glucose monitoring: is performed regularly, further diabetic ROS: no polyuria or polydipsia, no chest pain, dyspnea or TIA's, no hypoglycemia, has dysesthesias in the feet.  Other symptoms and concerns: blurred vision that he cannot see. States that he is still going to [facility] for depression relating to his severe pain. Reports that he is about to get evicted due to not having a job. He has tried to do some temporary work but was unable due to the severe pain in his feet.  Current Outpatient Prescriptions  Medication Sig Dispense Refill  . diclofenac sodium (VOLTAREN) 1 % GEL Apply 4 g topically 3 (three) times daily as needed. 100 g 4  . DULoxetine (CYMBALTA) 30 MG capsule Take 30 mg by mouth daily.    . Insulin Glargine (LANTUS SOLOSTAR) 100 UNIT/ML Solostar Pen Inject 26 Units into the skin daily at 10 pm. 30 mL 6  . metFORMIN (GLUCOPHAGE) 1000 MG tablet Take 1 tablet (1,000 mg total) by mouth 2 (two) times daily with a meal. New dose--Take 1 pill twice per day 60 tablet 4  . naproxen (NAPROSYN) 500 MG tablet Take 1 tablet (500 mg total) by mouth 2 (two) times daily with a meal. 30 tablet 0  . prazosin (MINIPRESS) 1 MG capsule Take 1 mg by mouth at bedtime.    . pregabalin (LYRICA) 100 MG capsule Take 1 capsule (100 mg total) by mouth 3 (three) times daily. 270 capsule 3  . QUEtiapine (SEROQUEL) 100 MG tablet Take 100 mg by mouth at bedtime.    . sildenafil (VIAGRA) 25 MG tablet Take 1 tablet (25 mg total) by mouth daily as needed for erectile dysfunction. 30 tablet 3  . traMADol (ULTRAM) 50 MG tablet Take 1 tablet (50 mg total) by mouth every 8 (eight) hours as needed. 90 tablet 0  . sulfamethoxazole-trimethoprim (BACTRIM DS,SEPTRA DS)  800-160 MG per tablet Take 1 tablet by mouth every 12 (twelve) hours. For 2 weeks. 28 tablet 0   No current facility-administered medications for this visit.    OBJECTIVE: Appearance: alert, well appearing, and in no distress, oriented to person, place, and time and normal appearing weight. BP 116/77 mmHg  Pulse 89  Temp(Src) 98 F (36.7 C)  Resp 16  Ht 6' 6" (1.981 m)  Wt 230 lb (104.327 kg)  BMI 26.58 kg/m2  SpO2 98%  Physical Exam  Constitutional: He is oriented to person, place, and time.  Cardiovascular: Normal rate, regular rhythm, normal heart sounds and intact distal pulses.   Pulses:      Dorsalis pedis pulses are 2+ on the right side, and 2+ on the left side.       Posterior tibial pulses are 2+ on the right side, and 2+ on the left side.  Pulmonary/Chest: Effort normal.  Musculoskeletal: He exhibits no edema.  Feet:  Right Foot:  Protective Sensation: 10 sites tested.0 sites sensed. Skin Integrity: Negative for skin breakdown.  Left Foot:  Protective Sensation: 10 sites tested. 2 sites sensed. Skin Integrity: Positive for callus.  Neurological: He is alert and oriented to person, place, and time.  Psychiatric:  Sad appearing      ASSESSMENT: [place] was seen today for follow-up.  Diagnoses and all orders for this visit:  Type 2 diabetes mellitus with diabetic polyneuropathy, without long-term current use of insulin (HCC) -     Glucose (CBG) -     HgB A1c -     Blood Glucose Monitoring Suppl (TRUE METRIX METER) w/Device KIT; Check blood sugar TID & QHS -     glucose blood (TRUE METRIX BLOOD GLUCOSE TEST) test strip; Use as instructed -     TRUEPLUS LANCETS 28G MISC; Check blood sugar TID & QHS -     metFORMIN (GLUCOPHAGE) 1000 MG tablet; Take 1 tablet (1,000 mg total) by mouth 2 (two) times daily with a meal. New dose--Take 1 pill twice per day -     Insulin Glargine (LANTUS SOLOSTAR) 100 UNIT/ML Solostar Pen; Inject 26 Units into the skin daily at 10  pm. A!C has improved significantly. I have encouraged patient to keep up the good work and to continue to follow ADA diet recommendation. Goal A1C for patient is 7%.   Pain, dental -     Ambulatory referral to Dentistry Explained that he will have a co-pay when attending dentist.  Diabetic polyneuropathy associated with type 2 diabetes mellitus ([place]) -    Refill traMADol (ULTRAM) 50 MG tablet; Take 1 tablet (50 mg total) by mouth every 8 (eight) hours as needed.   Return in about 3 months (around 10/30/2015) for Diabetes Mellitus.   Lance Bosch, NP 08/01/2015 2:03 PM

## 2015-08-02 ENCOUNTER — Telehealth: Payer: Self-pay

## 2015-08-02 ENCOUNTER — Telehealth: Payer: Self-pay | Admitting: Internal Medicine

## 2015-08-02 ENCOUNTER — Other Ambulatory Visit: Payer: Self-pay | Admitting: Internal Medicine

## 2015-08-02 NOTE — Telephone Encounter (Signed)
Pt. Requesting Letter of Assistance

## 2015-08-02 NOTE — Telephone Encounter (Signed)
Call patient to let him know letter is ready

## 2015-08-02 NOTE — Telephone Encounter (Signed)
Called patient to let him know his letter for social services is Ready for pick up

## 2015-08-02 NOTE — Telephone Encounter (Signed)
Returned patient phone call Patient went to social services to try to get food stamps Patient is requesting a letter from his provider stating he can not Work right now due to his severe neuropathy

## 2015-08-21 ENCOUNTER — Ambulatory Visit: Payer: Self-pay | Attending: Internal Medicine

## 2015-09-02 MED FILL — ?METFORMIN HCL 1,000 MG TAB: 1000 | 30 days supply | Qty: 60 | Fill #4

## 2015-09-12 MED FILL — $LANTUS SOLOSTAR 100 UNITS/: 100 | 34 days supply | Qty: 9 | Fill #2

## 2015-09-18 MED FILL — QUETIAPINE FUMARATE 100 MG: 100 | 30 days supply | Qty: 30 | Fill #0

## 2015-09-18 MED FILL — PRAZOSIN 1 MG CAPSULE: 1 | 30 days supply | Qty: 30 | Fill #0

## 2015-09-18 MED FILL — DULoxetine HCL 30 MG CPEP: 30 | 30 days supply | Qty: 30 | Fill #0

## 2015-10-15 ENCOUNTER — Encounter (HOSPITAL_COMMUNITY): Payer: Self-pay | Admitting: Emergency Medicine

## 2015-10-15 ENCOUNTER — Emergency Department (HOSPITAL_COMMUNITY): Payer: Self-pay

## 2015-10-15 ENCOUNTER — Emergency Department (HOSPITAL_COMMUNITY)
Admission: EM | Admit: 2015-10-15 | Discharge: 2015-10-16 | Disposition: A | Discharge status: Home / Self Care | Payer: Self-pay | Attending: Emergency Medicine | Admitting: Emergency Medicine

## 2015-10-15 DIAGNOSIS — E119 Type 2 diabetes mellitus without complications: Secondary | ICD-10-CM | POA: Insufficient documentation

## 2015-10-15 DIAGNOSIS — R0789 Other chest pain: Secondary | ICD-10-CM | POA: Insufficient documentation

## 2015-10-15 DIAGNOSIS — R0602 Shortness of breath: Secondary | ICD-10-CM | POA: Insufficient documentation

## 2015-10-15 DIAGNOSIS — Z79899 Other long term (current) drug therapy: Secondary | ICD-10-CM | POA: Insufficient documentation

## 2015-10-15 DIAGNOSIS — R05 Cough: Secondary | ICD-10-CM | POA: Insufficient documentation

## 2015-10-15 DIAGNOSIS — G629 Polyneuropathy, unspecified: Secondary | ICD-10-CM | POA: Insufficient documentation

## 2015-10-15 DIAGNOSIS — R079 Chest pain, unspecified: Secondary | ICD-10-CM

## 2015-10-15 DIAGNOSIS — Z794 Long term (current) use of insulin: Secondary | ICD-10-CM | POA: Insufficient documentation

## 2015-10-15 DIAGNOSIS — M79602 Pain in left arm: Secondary | ICD-10-CM | POA: Insufficient documentation

## 2015-10-15 DIAGNOSIS — F17210 Nicotine dependence, cigarettes, uncomplicated: Secondary | ICD-10-CM | POA: Insufficient documentation

## 2015-10-15 DIAGNOSIS — R0981 Nasal congestion: Secondary | ICD-10-CM | POA: Insufficient documentation

## 2015-10-15 DIAGNOSIS — Z7984 Long term (current) use of oral hypoglycemic drugs: Secondary | ICD-10-CM | POA: Insufficient documentation

## 2015-10-15 DIAGNOSIS — Z791 Long term (current) use of non-steroidal anti-inflammatories (NSAID): Secondary | ICD-10-CM | POA: Insufficient documentation

## 2015-10-15 DIAGNOSIS — M1990 Unspecified osteoarthritis, unspecified site: Secondary | ICD-10-CM | POA: Insufficient documentation

## 2015-10-15 LAB — CBC
HCT: 44.1 % (ref 39.0–52.0)
HEMOGLOBIN: 15.1 g/dL (ref 13.0–17.0)
MCH: 28 pg (ref 26.0–34.0)
MCHC: 34.2 g/dL (ref 30.0–36.0)
MCV: 81.8 fL (ref 78.0–100.0)
Platelets: 199 10*3/uL (ref 150–400)
RBC: 5.39 MIL/uL (ref 4.22–5.81)
RDW: 12.9 % (ref 11.5–15.5)
WBC: 5.8 10*3/uL (ref 4.0–10.5)

## 2015-10-15 LAB — BASIC METABOLIC PANEL
ANION GAP: 7 (ref 5–15)
BUN: 7 mg/dL (ref 6–20)
CALCIUM: 9.2 mg/dL (ref 8.9–10.3)
CO2: 27 mmol/L (ref 22–32)
Chloride: 104 mmol/L (ref 101–111)
Creatinine, Ser: 0.93 mg/dL (ref 0.61–1.24)
GLUCOSE: 181 mg/dL — AB (ref 65–99)
Potassium: 4.1 mmol/L (ref 3.5–5.1)
SODIUM: 138 mmol/L (ref 135–145)

## 2015-10-15 LAB — I-STAT TROPONIN, ED
TROPONIN I, POC: 0 ng/mL (ref 0.00–0.08)
Troponin i, poc: 0 ng/mL (ref 0.00–0.08)

## 2015-10-15 LAB — CBG MONITORING, ED: Glucose-Capillary: 138 mg/dL — ABNORMAL HIGH (ref 65–99)

## 2015-10-15 MED ORDER — IBUPROFEN 800 MG PO TABS
800.0000 mg | ORAL_TABLET | Freq: Once | ORAL | Status: AC
  Administered 2015-10-15: 800 mg via ORAL
  Filled 2015-10-15: qty 1

## 2015-10-15 MED ORDER — HYDROCODONE-ACETAMINOPHEN 5-325 MG PO TABS
2.0000 | ORAL_TABLET | Freq: Once | ORAL | Status: AC
  Administered 2015-10-15: 2 via ORAL
  Filled 2015-10-15: qty 2

## 2015-10-15 NOTE — ED Provider Notes (Signed)
By signing my name below, I, [name], attest that this documentation has been prepared under the direction and in the presence of Hersey, DO.  Electronically Signed: [name], ED Scribe. 10/15/2015. 11:16 PM.   TIME SEEN: 11:12 PM   CHIEF COMPLAINT:  Chief Complaint  Patient presents with  . Arm Pain  . Chest Pain     HPI:  HPI Comments: Ryan Shaw, R hand dominant is a 50 y.o. male with a PMHx of DM, arthritis, and neuropathy who presents to the Emergency Department complaining of intermittent, ongoing chest pain with associated shortness of breath x 2 weeks. Pain is described as dull. Discomfort is exacerbated when supine without any alleviating factors. Pt also reports constant, ongoing cough, sinus congestion along with L arm pain which is exacerbated with palpation to area x 2 weeks. No recent injury or trauma to arm. No alleviating factors reported for arm pain.  Pain does not seem to be related to chest pain. OTC Tylenol attempted prior to arrival without any improvement. No recent fever, chills, nausea, or vomiting, diarrhea.  PCP: Lance Bosch, NP    ROS: See HPI Constitutional: no fever  Eyes: no drainage  ENT: no runny nose. Positive congestion    Cardiovascular:  Positive chest pain  Resp: Positive cough and SOB  GI: no vomiting GU: no dysuria Integumentary: no rash  Allergy: no hives  Musculoskeletal: no leg swelling  Neurological: no slurred speech ROS otherwise negative  PAST MEDICAL HISTORY/PAST SURGICAL HISTORY:  Past Medical History  Diagnosis Date  . Diabetes mellitus without complication ([place])   . Neuropathy ([place])   . Arthritis     MEDICATIONS:  Prior to Admission medications   Medication Sig Start Date End Date Taking? Authorizing Provider  Blood Glucose Monitoring Suppl (TRUE METRIX METER) w/Device KIT Check blood sugar TID & QHS 08/01/15   Lance Bosch, NP  diclofenac sodium (VOLTAREN) 1 % GEL Apply 4 g topically 3 (three)  times daily as needed. 05/22/15   Lance Bosch, NP  DULoxetine (CYMBALTA) 30 MG capsule Take 30 mg by mouth daily.    Historical Provider, MD  glucose blood (TRUE METRIX BLOOD GLUCOSE TEST) test strip Use as instructed 08/01/15   Lance Bosch, NP  Insulin Glargine (LANTUS SOLOSTAR) 100 UNIT/ML Solostar Pen Inject 26 Units into the skin daily at 10 pm. 08/01/15   Lance Bosch, NP  metFORMIN (GLUCOPHAGE) 1000 MG tablet Take 1 tablet (1,000 mg total) by mouth 2 (two) times daily with a meal. New dose--Take 1 pill twice per day 08/01/15   Lance Bosch, NP  naproxen (NAPROSYN) 500 MG tablet Take 1 tablet (500 mg total) by mouth 2 (two) times daily with a meal. 06/17/15   Lance Bosch, NP  prazosin (MINIPRESS) 1 MG capsule Take 1 mg by mouth at bedtime.    Historical Provider, MD  pregabalin (LYRICA) 100 MG capsule Take 1 capsule (100 mg total) by mouth 3 (three) times daily. 06/14/15   Tresa Garter, MD  QUEtiapine (SEROQUEL) 100 MG tablet Take 100 mg by mouth at bedtime.    Historical Provider, MD  sildenafil (VIAGRA) 25 MG tablet Take 1 tablet (25 mg total) by mouth daily as needed for erectile dysfunction. 06/07/15   Tresa Garter, MD  sulfamethoxazole-trimethoprim (BACTRIM DS,SEPTRA DS) 800-160 MG per tablet Take 1 tablet by mouth every 12 (twelve) hours. For 2 weeks. 02/18/15   Bonnielee Haff, MD  traMADol Veatrice Bourbon) 50  MG tablet Take 1 tablet (50 mg total) by mouth every 8 (eight) hours as needed. 08/01/15   Lance Bosch, NP  TRUEPLUS LANCETS 28G MISC Check blood sugar TID & QHS 08/01/15   Lance Bosch, NP    ALLERGIES:  Allergies  Allergen Reactions  . Other Nausea And Vomiting    NeuRemedy - Dietary Supplement    SOCIAL HISTORY:  Social History  Substance Use Topics  . Smoking status: Current Every Day Smoker -- 0.25 packs/day    Types: Cigarettes  . Smokeless tobacco: Not on file     Comment: uses a vapor -puff-4-5 x/day  . Alcohol Use: No     Comment: patient  states he quit drinking since he has been sick    FAMILY HISTORY: Family History  Problem Relation Age of Onset  . Cancer Mother   . Aneurysm Father     brain    EXAM: BP 120/79 mmHg  Pulse 107  Temp(Src) 99.7 F (37.6 C) (Oral)  Resp 18  Ht _0  (1.981 m)  Wt 234 lb (106.142 kg)  BMI 27.05 kg/m2  SpO2 99% CONSTITUTIONAL: Alert and oriented and responds appropriately to questions. Well-appearing; well-nourished, afebrile and nontoxic HEAD: Normocephalic EYES: Conjunctivae clear, PERRL ENT: normal nose; no rhinorrhea; moist mucous membranes NECK: Supple, no meningismus, no LAD  CARD: RRR; S1 and S2 appreciated; no murmurs, no clicks, no rubs, no gallops CHEST:  Left chest wall tender to palpation without crepitus, ecchymosis or deformity. Palpation reproduces his chest pain. RESP: Normal chest excursion without splinting or tachypnea; breath sounds clear and equal bilaterally; no wheezes, no rhonchi, no rales, no hypoxia or respiratory distress, speaking full sentences ABD/GI: Normal bowel sounds; non-distended; soft, non-tender, no rebound, no guarding, no peritoneal signs BACK:  The back appears normal and is non-tender to palpation, there is no CVA tenderness EXT: Patient is tender to palpation throughout the left forearm, left elbow and left bicep area without bony deformity, erythema or warmth. No joint effusion. Compartments are soft. Normal ROM in all joints; 2+ radial pulse on the left side, otherwise extremities are non-tender to palpation; no edema; normal capillary refill; no cyanosis, no calf tenderness or swelling    SKIN: Normal color for age and race; warm; no rash NEURO: Moves all extremities equally, sensation to light touch intact diffusely, cranial nerves II through XII intact PSYCH: The patient's mood and manner are appropriate. Grooming and personal hygiene are appropriate.  MEDICAL DECISION MAKING: Patient here with what seems to be 3 separate complaints.  Complains of sinus congestion, dry cough for the past 2 weeks. No fever. Lungs are clear and chest x-ray shows no infiltrate. Discussed with patient that this is either viral illness versus allergies. Anticipate discharge on Flonase, Zyrtec. I do not feel he needs to be on antibiotics.  Patient also complaining of left-sided dull chest pain that is intermittent and worse with lying flat. None currently. Does have complaints of intermittent shortness of breath with this. Patient does have diabetes. States he thinks he has had a stress test before. First troponin is negative. Patient has been in the emergency department for 8 hours. I feel we can obtain a second troponin if this is negative he can follow-up with Dr. Feliciana Rossetti his PCP for an outpatient stress test. He is comfortable with this plan. Chest pain is not pleuritic or exertional. Seems very atypical.  Likely chest wall pain given it is worse with palpation. No EKG changes to suggest this  but will discharge him on high-dose NSAIDs.    Patient also complaining of left arm pain. It is reproducible with movement of the arm and palpation. Nothing to suggest gout, septic arthritis. No injury to suggest fracture or dislocation. Neurovascular intact distally. I do not feel x-rays will be helpful at this time. Patient has pain with lifting his shoulder and extending and flexing his elbow. Discussed with him this could be arthritis, tendinitis. Discussed that I feel anti-inflammatories will be helpful. Again recommended outpatient follow-up with Dr. Feliciana Rossetti if rest, ice, elevation and NSAIDs are not helpful.     ED PROGRESS: Patient reports his pain is much better. Second troponin negative. I do suspect this is chest wall pain. We'll discharge with Vicodin and ibuprofen. Have advised him to follow-up with Dr. Feliciana Rossetti. We'll also give outpatient cardiology follow-up information I feel he may need a stress test as an outpatient. Discussed return precautions. He  verbalizes understanding and is comfortable with this plan.   EKG Interpretation  Date/Time:  Tuesday October 15 2015 15:15:42 EDT Ventricular Rate:  104 PR Interval:  156 QRS Duration: 90 QT Interval:  330 QTC Calculation: 433 R Axis:   83 Text Interpretation:  Sinus tachycardia Nonspecific T wave abnormality Abnormal ECG No significant change since last tracing Reconfirmed by Cleland Simkins,  DO, Kaneisha Ellenberger [phone]) on 10/15/2015 11:07:56 PM        I personally performed the services described in this documentation, which was scribed in my presence. The recorded information has been reviewed and is accurate.   [place], DO 10/16/15 [id number]

## 2015-10-15 NOTE — ED Notes (Signed)
Pt reports L arm pain and some cp x 2 weeks. Also c.o sinus congestion with cough.

## 2015-10-16 MED ORDER — IBUPROFEN 800 MG PO TABS: 800.0000 mg | ORAL_TABLET | Freq: Three times a day (TID) | ORAL | Status: DC | PRN

## 2015-10-16 MED ORDER — HYDROCODONE-ACETAMINOPHEN 5-325 MG PO TABS: 1.0000 | ORAL_TABLET | Freq: Four times a day (QID) | ORAL | Status: DC | PRN

## 2015-10-16 NOTE — ED Notes (Signed)
Pt verbalized understanding of d/c instructions and has no further questions. Pt stable, ambulatory and pain free. Pt d/c home with daughter driving.

## 2015-10-16 NOTE — Discharge Instructions (Signed)

## 2016-10-02 ENCOUNTER — Inpatient Hospital Stay (HOSPITAL_COMMUNITY)
Admission: EM | Admit: 2016-10-02 | Discharge: 2016-10-05 | DRG: 617 | Disposition: A | Discharge status: Home / Self Care | Payer: Medicaid Other | Attending: Internal Medicine | Admitting: Internal Medicine

## 2016-10-02 ENCOUNTER — Encounter (HOSPITAL_COMMUNITY): Payer: Self-pay | Admitting: Emergency Medicine

## 2016-10-02 DIAGNOSIS — F17210 Nicotine dependence, cigarettes, uncomplicated: Secondary | ICD-10-CM | POA: Diagnosis present

## 2016-10-02 DIAGNOSIS — E1122 Type 2 diabetes mellitus with diabetic chronic kidney disease: Secondary | ICD-10-CM | POA: Diagnosis present

## 2016-10-02 DIAGNOSIS — N183 Chronic kidney disease, stage 3 unspecified: Secondary | ICD-10-CM | POA: Diagnosis present

## 2016-10-02 DIAGNOSIS — M869 Osteomyelitis, unspecified: Secondary | ICD-10-CM

## 2016-10-02 DIAGNOSIS — Z794 Long term (current) use of insulin: Secondary | ICD-10-CM

## 2016-10-02 DIAGNOSIS — E11621 Type 2 diabetes mellitus with foot ulcer: Secondary | ICD-10-CM

## 2016-10-02 DIAGNOSIS — E1100 Type 2 diabetes mellitus with hyperosmolarity without nonketotic hyperglycemic-hyperosmolar coma (NKHHC): Secondary | ICD-10-CM

## 2016-10-02 DIAGNOSIS — M6702 Short Achilles tendon (acquired), left ankle: Secondary | ICD-10-CM | POA: Diagnosis present

## 2016-10-02 DIAGNOSIS — M86672 Other chronic osteomyelitis, left ankle and foot: Secondary | ICD-10-CM | POA: Diagnosis present

## 2016-10-02 DIAGNOSIS — I10 Essential (primary) hypertension: Secondary | ICD-10-CM | POA: Diagnosis present

## 2016-10-02 DIAGNOSIS — I129 Hypertensive chronic kidney disease with stage 1 through stage 4 chronic kidney disease, or unspecified chronic kidney disease: Secondary | ICD-10-CM | POA: Diagnosis present

## 2016-10-02 DIAGNOSIS — E1142 Type 2 diabetes mellitus with diabetic polyneuropathy: Secondary | ICD-10-CM | POA: Diagnosis present

## 2016-10-02 DIAGNOSIS — E1169 Type 2 diabetes mellitus with other specified complication: Principal | ICD-10-CM | POA: Diagnosis present

## 2016-10-02 DIAGNOSIS — E1165 Type 2 diabetes mellitus with hyperglycemia: Secondary | ICD-10-CM | POA: Diagnosis present

## 2016-10-02 DIAGNOSIS — E7800 Pure hypercholesterolemia, unspecified: Secondary | ICD-10-CM | POA: Diagnosis present

## 2016-10-02 DIAGNOSIS — L97519 Non-pressure chronic ulcer of other part of right foot with unspecified severity: Secondary | ICD-10-CM | POA: Diagnosis present

## 2016-10-02 HISTORY — DX: Edema, unspecified: R60.9

## 2016-10-02 HISTORY — DX: Essential (primary) hypertension: I10

## 2016-10-02 HISTORY — DX: Pure hypercholesterolemia, unspecified: E78.00

## 2016-10-02 LAB — COMPREHENSIVE METABOLIC PANEL
ALT: 73 U/L — AB (ref 17–63)
AST: 33 U/L (ref 15–41)
Albumin: 3.8 g/dL (ref 3.5–5.0)
Alkaline Phosphatase: 216 U/L — ABNORMAL HIGH (ref 38–126)
Anion gap: 8 (ref 5–15)
BUN: 28 mg/dL — AB (ref 6–20)
CHLORIDE: 99 mmol/L — AB (ref 101–111)
CO2: 25 mmol/L (ref 22–32)
CREATININE: 1.61 mg/dL — AB (ref 0.61–1.24)
Calcium: 9.3 mg/dL (ref 8.9–10.3)
GFR calc non Af Amer: 48 mL/min — ABNORMAL LOW (ref >60)
GFR, EST AFRICAN AMERICAN: 56 mL/min — AB (ref >60)
Glucose, Bld: 564 mg/dL (ref 65–99)
POTASSIUM: 4.9 mmol/L (ref 3.5–5.1)
Sodium: 132 mmol/L — ABNORMAL LOW (ref 135–145)
Total Bilirubin: 0.6 mg/dL (ref 0.3–1.2)
Total Protein: 7.5 g/dL (ref 6.5–8.1)

## 2016-10-02 LAB — CBC WITH DIFFERENTIAL/PLATELET
Basophils Absolute: 0 10*3/uL (ref 0.0–0.1)
Basophils Relative: 0 %
EOS ABS: 0.2 10*3/uL (ref 0.0–0.7)
Eosinophils Relative: 3 %
HCT: 38 % — ABNORMAL LOW (ref 39.0–52.0)
HEMOGLOBIN: 12.3 g/dL — AB (ref 13.0–17.0)
LYMPHS PCT: 31 %
Lymphs Abs: 2.1 10*3/uL (ref 0.7–4.0)
MCH: 26.7 pg (ref 26.0–34.0)
MCHC: 32.4 g/dL (ref 30.0–36.0)
MCV: 82.4 fL (ref 78.0–100.0)
MONOS PCT: 6 %
Monocytes Absolute: 0.4 10*3/uL (ref 0.1–1.0)
NEUTROS PCT: 60 %
Neutro Abs: 4.1 10*3/uL (ref 1.7–7.7)
Platelets: 319 10*3/uL (ref 150–400)
RBC: 4.61 MIL/uL (ref 4.22–5.81)
RDW: 13.9 % (ref 11.5–15.5)
WBC: 6.9 10*3/uL (ref 4.0–10.5)

## 2016-10-02 LAB — URINALYSIS, ROUTINE W REFLEX MICROSCOPIC
BACTERIA UA: NONE SEEN
Bilirubin Urine: NEGATIVE
Glucose, UA: >=500 mg/dL — AB
Hgb urine dipstick: NEGATIVE
Ketones, ur: NEGATIVE mg/dL
Leukocytes, UA: NEGATIVE
Nitrite: NEGATIVE
PROTEIN: NEGATIVE mg/dL
SPECIFIC GRAVITY, URINE: 1.025 (ref 1.005–1.030)
pH: 6 (ref 5.0–8.0)

## 2016-10-02 LAB — SEDIMENTATION RATE: SED RATE: 41 mm/h — AB (ref 0–16)

## 2016-10-02 LAB — GLUCOSE, CAPILLARY: Glucose-Capillary: 393 mg/dL — ABNORMAL HIGH (ref 65–99)

## 2016-10-02 LAB — I-STAT CG4 LACTIC ACID, ED
LACTIC ACID, VENOUS: 0.9 mmol/L (ref 0.5–1.9)
LACTIC ACID, VENOUS: 1.2 mmol/L (ref 0.5–1.9)

## 2016-10-02 LAB — C-REACTIVE PROTEIN

## 2016-10-02 MED ORDER — ONDANSETRON HCL 4 MG/2ML IJ SOLN: 4.0000 mg | Freq: Four times a day (QID) | INTRAMUSCULAR | Status: DC | PRN

## 2016-10-02 MED ORDER — INSULIN ASPART 100 UNIT/ML ~~LOC~~ SOLN: 0.0000 [IU] | Freq: Every day | SUBCUTANEOUS | Status: DC

## 2016-10-02 MED ORDER — SODIUM CHLORIDE 0.9 % IV SOLN
INTRAVENOUS | Status: AC
  Administered 2016-10-03: 05:00:00 via INTRAVENOUS

## 2016-10-02 MED ORDER — INSULIN ASPART 100 UNIT/ML ~~LOC~~ SOLN
0.0000 [IU] | Freq: Three times a day (TID) | SUBCUTANEOUS | Status: DC
  Administered 2016-10-03: 8 [IU] via SUBCUTANEOUS
  Administered 2016-10-03: 2 [IU] via SUBCUTANEOUS
  Administered 2016-10-04: 8 [IU] via SUBCUTANEOUS
  Administered 2016-10-04: 3 [IU] via SUBCUTANEOUS
  Administered 2016-10-05: 5 [IU] via SUBCUTANEOUS
  Administered 2016-10-05: 11 [IU] via SUBCUTANEOUS

## 2016-10-02 MED ORDER — INSULIN ASPART 100 UNIT/ML ~~LOC~~ SOLN
15.0000 [IU] | Freq: Once | SUBCUTANEOUS | Status: AC
  Administered 2016-10-02: 15 [IU] via SUBCUTANEOUS
  Filled 2016-10-02: qty 1

## 2016-10-02 MED ORDER — INSULIN DETEMIR 100 UNIT/ML ~~LOC~~ SOLN
32.0000 [IU] | Freq: Every day | SUBCUTANEOUS | Status: DC
  Administered 2016-10-02 – 2016-10-03 (×2): 32 [IU] via SUBCUTANEOUS
  Filled 2016-10-02 (×2): qty 0.32

## 2016-10-02 MED ORDER — INSULIN ASPART 100 UNIT/ML ~~LOC~~ SOLN
4.0000 [IU] | Freq: Three times a day (TID) | SUBCUTANEOUS | Status: DC
  Administered 2016-10-03 – 2016-10-05 (×6): 4 [IU] via SUBCUTANEOUS

## 2016-10-02 MED ORDER — ONDANSETRON HCL 4 MG PO TABS: 4.0000 mg | ORAL_TABLET | Freq: Four times a day (QID) | ORAL | Status: DC | PRN

## 2016-10-02 MED ORDER — ACETAMINOPHEN 325 MG PO TABS
650.0000 mg | ORAL_TABLET | Freq: Four times a day (QID) | ORAL | Status: DC | PRN
  Administered 2016-10-02: 650 mg via ORAL
  Filled 2016-10-02: qty 2

## 2016-10-02 MED ORDER — PREGABALIN 100 MG PO CAPS
100.0000 mg | ORAL_CAPSULE | Freq: Three times a day (TID) | ORAL | Status: DC
  Administered 2016-10-02: 100 mg via ORAL
  Filled 2016-10-02: qty 1

## 2016-10-02 MED ORDER — ACETAMINOPHEN 650 MG RE SUPP: 650.0000 mg | Freq: Four times a day (QID) | RECTAL | Status: DC | PRN

## 2016-10-02 MED ORDER — ENOXAPARIN SODIUM 40 MG/0.4ML ~~LOC~~ SOLN
40.0000 mg | SUBCUTANEOUS | Status: DC
  Administered 2016-10-02 – 2016-10-04 (×2): 40 mg via SUBCUTANEOUS
  Filled 2016-10-02 (×3): qty 0.4

## 2016-10-02 MED ORDER — DEXTROSE 5 % IV SOLN
2.0000 g | INTRAVENOUS | Status: DC
  Administered 2016-10-02 – 2016-10-04 (×3): 2 g via INTRAVENOUS
  Filled 2016-10-02 (×3): qty 2

## 2016-10-02 MED ORDER — METRONIDAZOLE 500 MG PO TABS
500.0000 mg | ORAL_TABLET | Freq: Three times a day (TID) | ORAL | Status: DC
  Administered 2016-10-02 – 2016-10-05 (×8): 500 mg via ORAL
  Filled 2016-10-02 (×8): qty 1

## 2016-10-02 NOTE — ED Notes (Signed)
564 glucose critical lab notification.

## 2016-10-02 NOTE — ED Provider Notes (Signed)
[facility] DEPT Provider Note   CSN: [id number] Arrival date & time: 10/02/16  1256     History   Chief Complaint Chief Complaint  Patient presents with  . Wound Infection    HPI Ryan Shaw is a 51 y.o. male.  HPI patient presents with reported infection on his left great toe. Has had previous surgery on it. States he just moved up here from [place]. States that his doctor called and told him that the MRI he just had showed an infection to the bone. States he's had fevers and chills. States he was previously on an antibiotic and a cautious kidneys to fail. Does not have a doctor in [employer] anymore. Patient states his sugars been running high also. History of diabetes.  Past Medical History:  Diagnosis Date  . Arthritis   . Diabetes mellitus without complication ([place])   . Edema   . Hypercholesteremia   . Hypertension   . Neuropathy [facility])     Patient Active Problem List   Diagnosis Date Noted  . Bilateral chronic knee pain 07/09/2015  . Acute osteomyelitis of toe of left foot ([place]) 02/16/2015  . DM type 2, uncontrolled, with neuropathy ([place]) 11/01/2014  . Diabetic peripheral neuropathy ([place]) 10/25/2014    Past Surgical History:  Procedure Laterality Date  . AMPUTATION Right 02/17/2015   Procedure: SECOND RAY AMPUTATION;  Surgeon: [name], MD;  Location: [facility];  Service: Orthopedics;  Laterality: Right;       Home Medications    Prior to Admission medications   Medication Sig Start Date End Date Taking? Authorizing Provider  Blood Glucose Monitoring Suppl (TRUE METRIX METER) w/Device KIT Check blood sugar TID & QHS 08/01/15   Lance Bosch, NP  diclofenac sodium (VOLTAREN) 1 % GEL Apply 4 g topically 3 (three) times daily as needed. 05/22/15   Lance Bosch, NP  DULoxetine (CYMBALTA) 30 MG capsule Take 30 mg by mouth daily.    Historical Provider, MD  glucose blood (TRUE METRIX BLOOD GLUCOSE TEST) test strip Use as instructed 08/01/15    Lance Bosch, NP  HYDROcodone-acetaminophen (NORCO/VICODIN) 5-325 MG tablet Take 1-2 tablets by mouth every 6 (six) hours as needed. 10/16/15   Kristen N Ward, DO  ibuprofen (ADVIL,MOTRIN) 800 MG tablet Take 1 tablet (800 mg total) by mouth every 8 (eight) hours as needed for mild pain. 10/16/15   Kristen N Ward, DO  Insulin Glargine (LANTUS SOLOSTAR) 100 UNIT/ML Solostar Pen Inject 26 Units into the skin daily at 10 pm. 08/01/15   Lance Bosch, NP  metFORMIN (GLUCOPHAGE) 1000 MG tablet Take 1 tablet (1,000 mg total) by mouth 2 (two) times daily with a meal. New dose--Take 1 pill twice per day 08/01/15   Lance Bosch, NP  naproxen (NAPROSYN) 500 MG tablet Take 1 tablet (500 mg total) by mouth 2 (two) times daily with a meal. 06/17/15   Lance Bosch, NP  prazosin (MINIPRESS) 1 MG capsule Take 1 mg by mouth at bedtime.    Historical Provider, MD  pregabalin (LYRICA) 100 MG capsule Take 1 capsule (100 mg total) by mouth 3 (three) times daily. 06/14/15   Tresa Garter, MD  QUEtiapine (SEROQUEL) 100 MG tablet Take 100 mg by mouth at bedtime.    Historical Provider, MD  sildenafil (VIAGRA) 25 MG tablet Take 1 tablet (25 mg total) by mouth daily as needed for erectile dysfunction. 06/07/15   Tresa Garter, MD  sulfamethoxazole-trimethoprim (BACTRIM DS,SEPTRA DS) 800-160  MG per tablet Take 1 tablet by mouth every 12 (twelve) hours. For 2 weeks. 02/18/15   Bonnielee Haff, MD  traMADol (ULTRAM) 50 MG tablet Take 1 tablet (50 mg total) by mouth every 8 (eight) hours as needed. 08/01/15   Lance Bosch, NP  TRUEPLUS LANCETS 28G MISC Check blood sugar TID & QHS 08/01/15   Lance Bosch, NP    Family History Family History  Problem Relation Age of Onset  . Cancer Mother   . Aneurysm Father     brain    Social History Social History  Substance Use Topics  . Smoking status: Current Every Day Smoker    Packs/day: 0.25    Types: Cigarettes  . Smokeless tobacco: Never Used     Comment:  uses a vapor -puff-4-5 x/day  . Alcohol use No     Comment: patient states he quit drinking since he has been sick     Allergies   Other   Review of Systems Review of Systems  Constitutional: Positive for chills. Negative for appetite change.  Respiratory: Negative for apnea.   Cardiovascular: Negative for chest pain.  Gastrointestinal: Negative for abdominal distention.  Genitourinary: Negative for flank pain.  Musculoskeletal: Negative for back pain.  Skin: Positive for wound.  Neurological: Negative for numbness.  Hematological: Negative for adenopathy.  Psychiatric/Behavioral: Negative for confusion.     Physical Exam Updated Vital Signs BP 137/85   Pulse 86   Temp 98.3 F (36.8 C) (Oral)   Resp 16   Ht 6' 6"  (1.981 m)   Wt 245 lb (111.1 kg)   SpO2 100%   BMI 28.31 kg/m   Physical Exam  Constitutional: He appears well-developed.  HENT:  Head: Atraumatic.  Neck: Neck supple.  Cardiovascular: Normal rate.   Pulmonary/Chest: Effort normal.  Abdominal: Soft.  Musculoskeletal: He exhibits no edema.  Wound to plantar and medial aspect of left great toe. No drainage. No purulence. Patient does not have sensation here. His had some ischemia of the tip of the toe.  Neurological: He is alert.  Skin: Skin is warm. Capillary refill takes less than 2 seconds.  Psychiatric: He has a normal mood and affect.     ED Treatments / Results  Labs (all labs ordered are listed, but only abnormal results are displayed) Labs Reviewed  COMPREHENSIVE METABOLIC PANEL - Abnormal; Notable for the following:       Result Value   Sodium 132 (*)    Chloride 99 (*)    Glucose, Bld 564 (*)    BUN 28 (*)    Creatinine, Ser 1.61 (*)    ALT 73 (*)    Alkaline Phosphatase 216 (*)    GFR calc non Af Amer 48 (*)    GFR calc Af Amer 56 (*)    All other components within normal limits  CBC WITH DIFFERENTIAL/PLATELET - Abnormal; Notable for the following:    Hemoglobin 12.3 (*)    HCT  38.0 (*)    All other components within normal limits  URINALYSIS, ROUTINE W REFLEX MICROSCOPIC - Abnormal; Notable for the following:    Color, Urine STRAW (*)    Glucose, UA >=500 (*)    Squamous Epithelial / LPF 0-5 (*)    All other components within normal limits  SEDIMENTATION RATE - Abnormal; Notable for the following:    Sed Rate 41 (*)    All other components within normal limits  C-REACTIVE PROTEIN  I-STAT CG4 LACTIC ACID, ED  I-STAT CG4 LACTIC ACID, ED    EKG  EKG Interpretation None       Radiology No results found.  Procedures Procedures (including critical care time)  Medications Ordered in ED Medications - No data to display   Initial Impression / Assessment and Plan / ED Course  I have reviewed the triage vital signs and the nursing notes.  Pertinent labs & imaging results that were available during my care of the patient were reviewed by me and considered in my medical decision making (see chart for details).     Patient with reported osteomyelitis of left great toe. Reportedly had MRI that showed this however I do not have access to this. Has been on an unknown antibiotic previously that made his kidneys fail. Labs overall reassuring here. Creatinine is mildly elevated. Sugars also elevated without DKA. Will admit to internal medicine.  Final Clinical Impressions(s) / ED Diagnoses   Final diagnoses:  Osteomyelitis of toe of left foot [facility])    New Prescriptions New Prescriptions   No medications on file     Davonna Belling, MD 10/02/16 1916

## 2016-10-02 NOTE — ED Notes (Signed)
Attempted to call report

## 2016-10-02 NOTE — ED Notes (Signed)
Admitting provider at bedside.

## 2016-10-02 NOTE — ED Notes (Signed)
Dr. Corlis LeakMackuen notified of pt glucose

## 2016-10-02 NOTE — H&P (Signed)
History and Physical  Patient Name: Ryan Shaw     TIR:[id number]    DOB: Oct 25, 1965    DOA: 10/02/2016 PCP: Lance Bosch, NP   Patient coming from: Home  Chief Complaint: Osteomyelitis sent from his ortho  HPI: Ryan Shaw is a 51 y.o. male with a past medical history significant for IDDM and HTN and previous toe amputation who presents with suspected osteomyelitis.  The patient first developed infection in his left great toe last fall, was admitted to [facility] in [place] where he is from in November with sepsis, MR at the time showed no osteomyelitis, treated with vancomycin and Zosyn, debrided in the OR without amputation, developed acute renal failure requiring HD 3, thought to be either vancomycin toxicity or AIN from Zosyn, renal function gradually improved. Since then he spent getting outpatient wound care, following regularly with Dr. Santo Held of Orthopedics in [place].  In the last week, he finally made arrangements to move back to [place] to live with his daughter (he cannot work, is mostly dependent on others, has no Disability payments).  Since January, he has had new intermittent swelling of his left great toe and foot.  There is a large ulcer, which drains foul smelling purulent discharge, and the foot has become more numb.  He has been following with Dr. Santo Held, on oral antidibiotics, then got an MRI this week. As his ex-wife was driving him to [facility] today, his Orthopedist called him to say there was "infection in the bone" and "osteomyelitis" and it would "probably" need to be amputated and so he should be admitted to [facility]. Because the patient was already up here and planned to stay here, he would come to the ER here.  He has had some hyperglycemia lately, he has had intermittent chills.  He has drainage from the left great toe, also the right great toe now.  ED course: -Afebrile, heart rate 110, respirations and pulse ox normal, BP  120/89 -Na 132, Glucose 564, Cr 1.61 (baseline 0.9 a year ago, trending down still from ARF in Nov 2017 based on [facility]where records), WBC 6.9K, Hgb 12.3 -Lactate normal -ESR mildly elevated, CRP normal -UA unremarkable, except glucose -The case was discussed with Orthopedics who recommended elective amputation, consultation with Dr. Sharol Given and admission at discretion of Medicine for hyperglycemia     ROS: Review of Systems  Constitutional: Positive for chills.  Skin:       Toe swelling, drainage  All other systems reviewed and are negative.         Past Medical History:  Diagnosis Date  . Arthritis   . Diabetes mellitus without complication ([place])   . Edema   . Hypercholesteremia   . Hypertension   . Neuropathy [facility])     Past Surgical History:  Procedure Laterality Date  . AMPUTATION Right 02/17/2015   Procedure: SECOND RAY AMPUTATION;  Surgeon: [name], MD;  Location: [facility];  Service: Orthopedics;  Laterality: Right;    Social History: Patient just moved to [place] this week to live with his daughter.  He had lived with her before for several years, was back in [place] where he grew up for the past 18 months.  The patient walks unassisted.  He does not work and is not on disability.  He smokes.  Allergies  Allergen Reactions  . Other Nausea And Vomiting    NeuRemedy - Dietary Supplement    Family history: family history includes Aneurysm in his father; Cancer  in his mother.  Prior to Admission medications   Medication Sig Start Date End Date Taking? Authorizing Provider  Blood Glucose Monitoring Suppl (TRUE METRIX METER) w/Device KIT Check blood sugar TID & QHS 08/01/15   Lance Bosch, NP  diclofenac sodium (VOLTAREN) 1 % GEL Apply 4 g topically 3 (three) times daily as needed. 05/22/15   Lance Bosch, NP  DULoxetine (CYMBALTA) 30 MG capsule Take 30 mg by mouth daily.    Historical Provider, MD  glucose blood (TRUE METRIX BLOOD GLUCOSE  TEST) test strip Use as instructed 08/01/15   Lance Bosch, NP  HYDROcodone-acetaminophen (NORCO/VICODIN) 5-325 MG tablet Take 1-2 tablets by mouth every 6 (six) hours as needed. 10/16/15   Kristen N Ward, DO  ibuprofen (ADVIL,MOTRIN) 800 MG tablet Take 1 tablet (800 mg total) by mouth every 8 (eight) hours as needed for mild pain. 10/16/15   Kristen N Ward, DO  Insulin Glargine (LANTUS SOLOSTAR) 100 UNIT/ML Solostar Pen Inject 26 Units into the skin daily at 10 pm. 08/01/15   Lance Bosch, NP  metFORMIN (GLUCOPHAGE) 1000 MG tablet Take 1 tablet (1,000 mg total) by mouth 2 (two) times daily with a meal. New dose--Take 1 pill twice per day 08/01/15   Lance Bosch, NP  naproxen (NAPROSYN) 500 MG tablet Take 1 tablet (500 mg total) by mouth 2 (two) times daily with a meal. 06/17/15   Lance Bosch, NP  prazosin (MINIPRESS) 1 MG capsule Take 1 mg by mouth at bedtime.    Historical Provider, MD  pregabalin (LYRICA) 100 MG capsule Take 1 capsule (100 mg total) by mouth 3 (three) times daily. 06/14/15   Tresa Garter, MD  QUEtiapine (SEROQUEL) 100 MG tablet Take 100 mg by mouth at bedtime.    Historical Provider, MD  sildenafil (VIAGRA) 25 MG tablet Take 1 tablet (25 mg total) by mouth daily as needed for erectile dysfunction. 06/07/15   Tresa Garter, MD  sulfamethoxazole-trimethoprim (BACTRIM DS,SEPTRA DS) 800-160 MG per tablet Take 1 tablet by mouth every 12 (twelve) hours. For 2 weeks. 02/18/15   Bonnielee Haff, MD  traMADol (ULTRAM) 50 MG tablet Take 1 tablet (50 mg total) by mouth every 8 (eight) hours as needed. 08/01/15   Lance Bosch, NP  TRUEPLUS LANCETS 28G MISC Check blood sugar TID & QHS 08/01/15   Lance Bosch, NP       Physical Exam: BP 137/85   Pulse 86   Temp 98.3 F (36.8 C) (Oral)   Resp 16   Ht 6' 6"  (1.981 m)   Wt 111.1 kg (245 lb)   SpO2 100%   BMI 28.31 kg/m  General appearance: Well-developed, adult male, alert and in no acute distress.   Eyes:  Anicteric, conjunctiva pink, lids and lashes normal. PERRL.    ENT: No nasal deformity, discharge, epistaxis.  Hearing normal. OP moist without lesions.   Neck: No neck masses.  Trachea midline.  No thyromegaly/tenderness. Lymph: No cervical or supraclavicular lymphadenopathy. Skin: Warm and dry.  No suspicious rashes or lesions.    Cardiac: Tachycardic, nl S1-S2, no murmurs appreciated.  Capillary refill is brisk.  JVP normal.  No LE edema.  Radial and DP pulses 2+ and symmetric. Respiratory: Normal respiratory rate and rhythm.  CTAB without rales or wheezes. Abdomen: Abdomen soft.  No TTP. No ascites, distension, hepatosplenomegaly.   MSK: No deformities or effusions.  No cyanosis or clubbing. Neuro: Cranial nerves normal.  Sensation intact to light  touch but diminished in finger tips and feet bilaterally. Speech is fluent.  Muscle strength normal.    Psych: Sensorium intact and responding to questions, attention normal.  Behavior appropriate.  Affect normal.  Judgment and insight appear normal.     Labs on Admission:  I have personally reviewed following labs and imaging studies: CBC:  Recent Labs Lab 10/02/16 1314  WBC 6.9  NEUTROABS 4.1  HGB 12.3*  HCT 38.0*  MCV 82.4  PLT 329   Basic Metabolic Panel:  Recent Labs Lab 10/02/16 1314  NA 132*  K 4.9  CL 99*  CO2 25  GLUCOSE 564*  BUN 28*  CREATININE 1.61*  CALCIUM 9.3   GFR: Estimated Creatinine Clearance: 77.1 mL/min (by C-G formula based on SCr of 1.61 mg/dL (H)).  Liver Function Tests:  Recent Labs Lab 10/02/16 1314  AST 33  ALT 73*  ALKPHOS 216*  BILITOT 0.6  PROT 7.5  ALBUMIN 3.8   No results for input(s): LIPASE, AMYLASE in the last 168 hours. No results for input(s): AMMONIA in the last 168 hours. Coagulation Profile: No results for input(s): INR, PROTIME in the last 168 hours. Cardiac Enzymes: No results for input(s): CKTOTAL, CKMB, CKMBINDEX, TROPONINI in the last 168 hours. BNP (last 3  results) No results for input(s): PROBNP in the last 8760 hours. HbA1C: No results for input(s): HGBA1C in the last 72 hours. CBG: No results for input(s): GLUCAP in the last 168 hours. Lipid Profile: No results for input(s): CHOL, HDL, LDLCALC, TRIG, CHOLHDL, LDLDIRECT in the last 72 hours. Thyroid Function Tests: No results for input(s): TSH, T4TOTAL, FREET4, T3FREE, THYROIDAB in the last 72 hours. Anemia Panel: No results for input(s): VITAMINB12, FOLATE, FERRITIN, TIBC, IRON, RETICCTPCT in the last 72 hours. Sepsis Labs: Lactate normal Invalid input(s): PROCALCITONIN, LACTICIDVEN No results found for this or any previous visit (from the past 240 hour(s)).                  Assessment/Plan  1. Hyperglycemia in insulin dependent diabetes: Uncontrolled in setting of infection. -Continue home Levemir, increase dose -SSI with meals -Fixed 4 units with meals -IVF -Continue Lyrica   2. Suspected osteomyelitis:  Developing over last 2 months while in [place].  Purportedly with osteomyelitis by MR imaging from [place].  Ulcer certainly appears to be probe-able to bone.     -Will give one dose Ceftriaxone and Flagyl tonight -Consult to Orthopedics tomorrow -Consult wound -Consult Social Work re: lack of insurance and Care Management -Will defer repeat imaging to Orthopedics  3. Hypertension:  Well controlled. -Continue metoprolol, amlodipine -Hold lisinopril           DVT prophylaxis: Lovenox  Code Status: FULL  Family Communication: Ex-wife at bedside  Disposition Plan: Anticipate IV fludis and insulin to control glucose.  Consult with Ortho tomorrow and likely discharge then elective amputation with Ortho. Consults called: Orthopedics Admission status: OBS At the point of initial evaluation, it is my clinical opinion that admission for OBSERVATION is reasonable and necessary because the patient's presenting complaints in the context of their  chronic conditions represent sufficient risk of deterioration or significant morbidity to constitute reasonable grounds for close observation in the hospital setting, but that the patient may be medically stable for discharge from the hospital within 24 to 48 hours.    Medical decision making: Patient seen at 7:30 PM on 10/02/2016.  The patient was discussed with Dr. Percell Miller.  What exists of the patient's chart was reviewed in  depth and summarized above.  Clinical condition: stable.        Edwin Dada [facility] Pager [phone]

## 2016-10-02 NOTE — ED Triage Notes (Signed)
Pt states he has had a cyst on his left foot- had surgery. Nonhealing wound. Pts MD called him and told him he has infection in his bone. Pt has had fever at times as well

## 2016-10-03 ENCOUNTER — Observation Stay (HOSPITAL_COMMUNITY): Payer: Medicaid Other

## 2016-10-03 DIAGNOSIS — Z794 Long term (current) use of insulin: Secondary | ICD-10-CM

## 2016-10-03 DIAGNOSIS — M6702 Short Achilles tendon (acquired), left ankle: Secondary | ICD-10-CM

## 2016-10-03 DIAGNOSIS — N183 Chronic kidney disease, stage 3 (moderate): Secondary | ICD-10-CM | POA: Diagnosis present

## 2016-10-03 DIAGNOSIS — E1140 Type 2 diabetes mellitus with diabetic neuropathy, unspecified: Secondary | ICD-10-CM

## 2016-10-03 DIAGNOSIS — E11621 Type 2 diabetes mellitus with foot ulcer: Secondary | ICD-10-CM | POA: Diagnosis present

## 2016-10-03 DIAGNOSIS — E1169 Type 2 diabetes mellitus with other specified complication: Secondary | ICD-10-CM | POA: Diagnosis present

## 2016-10-03 DIAGNOSIS — L97519 Non-pressure chronic ulcer of other part of right foot with unspecified severity: Secondary | ICD-10-CM | POA: Diagnosis present

## 2016-10-03 DIAGNOSIS — F17210 Nicotine dependence, cigarettes, uncomplicated: Secondary | ICD-10-CM | POA: Diagnosis present

## 2016-10-03 DIAGNOSIS — M86672 Other chronic osteomyelitis, left ankle and foot: Secondary | ICD-10-CM

## 2016-10-03 DIAGNOSIS — E7800 Pure hypercholesterolemia, unspecified: Secondary | ICD-10-CM | POA: Diagnosis present

## 2016-10-03 DIAGNOSIS — M869 Osteomyelitis, unspecified: Secondary | ICD-10-CM

## 2016-10-03 DIAGNOSIS — M7989 Other specified soft tissue disorders: Secondary | ICD-10-CM | POA: Diagnosis present

## 2016-10-03 DIAGNOSIS — E1142 Type 2 diabetes mellitus with diabetic polyneuropathy: Secondary | ICD-10-CM | POA: Diagnosis present

## 2016-10-03 DIAGNOSIS — I10 Essential (primary) hypertension: Secondary | ICD-10-CM

## 2016-10-03 DIAGNOSIS — I129 Hypertensive chronic kidney disease with stage 1 through stage 4 chronic kidney disease, or unspecified chronic kidney disease: Secondary | ICD-10-CM | POA: Diagnosis present

## 2016-10-03 DIAGNOSIS — E1165 Type 2 diabetes mellitus with hyperglycemia: Secondary | ICD-10-CM | POA: Diagnosis present

## 2016-10-03 DIAGNOSIS — E1122 Type 2 diabetes mellitus with diabetic chronic kidney disease: Secondary | ICD-10-CM | POA: Diagnosis present

## 2016-10-03 LAB — BASIC METABOLIC PANEL
ANION GAP: 8 (ref 5–15)
BUN: 21 mg/dL — ABNORMAL HIGH (ref 6–20)
CALCIUM: 8.9 mg/dL (ref 8.9–10.3)
CO2: 27 mmol/L (ref 22–32)
Chloride: 104 mmol/L (ref 101–111)
Creatinine, Ser: 1.21 mg/dL (ref 0.61–1.24)
GFR calc Af Amer: >60 mL/min (ref >60)
Glucose, Bld: 100 mg/dL — ABNORMAL HIGH (ref 65–99)
POTASSIUM: 3.9 mmol/L (ref 3.5–5.1)
SODIUM: 139 mmol/L (ref 135–145)

## 2016-10-03 LAB — GLUCOSE, CAPILLARY
GLUCOSE-CAPILLARY: 126 mg/dL — AB (ref 65–99)
GLUCOSE-CAPILLARY: 114 mg/dL — AB (ref 65–99)
GLUCOSE-CAPILLARY: 258 mg/dL — AB (ref 65–99)
GLUCOSE-CAPILLARY: 196 mg/dL — AB (ref 65–99)
Glucose-Capillary: 148 mg/dL — ABNORMAL HIGH (ref 65–99)

## 2016-10-03 LAB — HIV ANTIBODY (ROUTINE TESTING W REFLEX): HIV SCREEN 4TH GENERATION: NONREACTIVE

## 2016-10-03 MED ORDER — HYDROCODONE-ACETAMINOPHEN 5-325 MG PO TABS
1.0000 | ORAL_TABLET | Freq: Four times a day (QID) | ORAL | Status: DC | PRN
  Administered 2016-10-03 – 2016-10-04 (×3): 2 via ORAL
  Filled 2016-10-03 (×2): qty 2

## 2016-10-03 MED ORDER — CHLORHEXIDINE GLUCONATE 4 % EX LIQD
60.0000 mL | Freq: Once | CUTANEOUS | Status: AC
  Administered 2016-10-04: 4 via TOPICAL
  Filled 2016-10-03: qty 60

## 2016-10-03 MED ORDER — LISINOPRIL 5 MG PO TABS
5.0000 mg | ORAL_TABLET | Freq: Every day | ORAL | Status: DC
  Administered 2016-10-03 – 2016-10-04 (×2): 5 mg via ORAL
  Filled 2016-10-03 (×3): qty 1

## 2016-10-03 MED ORDER — PREGABALIN 100 MG PO CAPS
100.0000 mg | ORAL_CAPSULE | Freq: Three times a day (TID) | ORAL | Status: DC
  Administered 2016-10-03 – 2016-10-05 (×7): 100 mg via ORAL
  Filled 2016-10-03 (×7): qty 1

## 2016-10-03 MED ORDER — AMLODIPINE BESYLATE 10 MG PO TABS
10.0000 mg | ORAL_TABLET | Freq: Every day | ORAL | Status: DC
  Administered 2016-10-03 – 2016-10-04 (×2): 10 mg via ORAL
  Filled 2016-10-03 (×3): qty 1

## 2016-10-03 MED ORDER — POVIDONE-IODINE 10 % EX SWAB: 2.0000 "application " | Freq: Once | CUTANEOUS | Status: DC

## 2016-10-03 MED ORDER — METOPROLOL TARTRATE 25 MG PO TABS
25.0000 mg | ORAL_TABLET | Freq: Two times a day (BID) | ORAL | Status: DC
  Administered 2016-10-03 – 2016-10-05 (×5): 25 mg via ORAL
  Filled 2016-10-03 (×5): qty 1

## 2016-10-03 NOTE — Consult Note (Signed)
ORTHOPAEDIC CONSULTATION  REQUESTING PHYSICIAN: Clydia Llano, MD  Chief Complaint: Chronic ulcer left great toe.  HPI: Ryan Shaw is a 51 y.o. male who presents with chronic ulcer left great toe. Patient has been treated in [facility] for a prolonged period of time including debridement of the chronic ulcer left great toe. Patient has failed conservative therapy and is seen for chronic ulcer with bony involvement.  Past Medical History:  Diagnosis Date  . Arthritis   . Diabetes mellitus without complication (HCC)   . Edema   . Hypercholesteremia   . Hypertension   . Neuropathy [facility])    Past Surgical History:  Procedure Laterality Date  . AMPUTATION Right 02/17/2015   Procedure: SECOND RAY AMPUTATION;  Surgeon: Kathryne Hitch, MD;  Location: [facility] OR;  Service: Orthopedics;  Laterality: Right;   Social History   Social History  . Marital status: Legally Separated    Spouse name: N/A  . Number of children: N/A  . Years of education: N/A   Social History Main Topics  . Smoking status: Current Every Day Smoker    Packs/day: 0.25    Types: Cigarettes  . Smokeless tobacco: Never Used     Comment: uses a vapor -puff-4-5 x/day  . Alcohol use No     Comment: patient states he quit drinking since he has been sick  . Drug use: Yes    Types: Marijuana  . Sexual activity: Not Asked   Other Topics Concern  . None   Social History Narrative  . None   Family History  Problem Relation Age of Onset  . Cancer Mother   . Aneurysm Father     brain   - negative except otherwise stated in the family history section Allergies  Allergen Reactions  . Other Nausea And Vomiting    NeuRemedy - Dietary Supplement   Prior to Admission medications   Medication Sig Start Date End Date Taking? Authorizing Provider  amLODipine (NORVASC) 10 MG tablet Take 10 mg by mouth daily.   Yes Historical Provider, MD  diclofenac sodium (VOLTAREN) 1 % GEL Apply 4 g  topically 3 (three) times daily as needed. Patient taking differently: Apply 4 g topically 3 (three) times daily as needed (pain).  05/22/15  Yes Ambrose Finland, NP  HYDROcodone-acetaminophen (NORCO/VICODIN) 5-325 MG tablet Take 1-2 tablets by mouth every 6 (six) hours as needed. Patient taking differently: Take 1-2 tablets by mouth every 6 (six) hours as needed for moderate pain.  10/16/15  Yes Kristen N Ward, DO  ibuprofen (ADVIL,MOTRIN) 800 MG tablet Take 1 tablet (800 mg total) by mouth every 8 (eight) hours as needed for mild pain. 10/16/15  Yes Kristen N Ward, DO  insulin detemir (LEVEMIR) 100 UNIT/ML injection Inject 26 Units into the skin at bedtime.   Yes Historical Provider, MD  lisinopril (PRINIVIL,ZESTRIL) 5 MG tablet Take 5 mg by mouth daily.   Yes Historical Provider, MD  metoprolol tartrate (LOPRESSOR) 25 MG tablet Take 25 mg by mouth 2 (two) times daily.   Yes Historical Provider, MD  pregabalin (LYRICA) 100 MG capsule Take 1 capsule (100 mg total) by mouth 3 (three) times daily. 06/14/15  Yes Quentin Angst, MD  sildenafil (VIAGRA) 25 MG tablet Take 1 tablet (25 mg total) by mouth daily as needed for erectile dysfunction. 06/07/15  Yes Olugbemiga Annitta Needs, MD  traMADol (ULTRAM) 50 MG tablet Take 1 tablet (50 mg total) by mouth every 8 (eight) hours as  needed. Patient taking differently: Take 50 mg by mouth every 8 (eight) hours as needed for moderate pain.  08/01/15  Yes Ambrose FinlandValerie A Keck, NP  TRUEPLUS LANCETS 28G MISC Check blood sugar TID & QHS 08/01/15  Yes Ambrose FinlandValerie A Keck, NP   No results found. - pertinent xrays, CT, MRI studies were reviewed and independently interpreted  Positive ROS: All other systems have been reviewed and were otherwise negative with the exception of those mentioned in the HPI and as above.  Physical Exam: General: Alert, no acute distress Psychiatric: Patient is competent for consent with normal mood and affect Lymphatic: No axillary or cervical  lymphadenopathy Cardiovascular: No pedal edema Respiratory: No cyanosis, no use of accessory musculature GI: No organomegaly, abdomen is soft and non-tender  Skin: Examination of the left great toe patient has callus a large ulcer beneath the proximal phalanx of the left great toe which probes to bone. Patient also has a superficial ulcer of the metatarsal head right great toe.   Neurologic: Patient does not have protective sensation bilateral lower extremities.   MUSCULOSKELETAL:  Examination patient has a palpable dorsalis pedis and posterior tibial pulse bilaterally. 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  Plan: Plan: We'll obtain 2 view radiographs of both feet. We will plan for surgical intervention tomorrow Sunday morning for amputation of the left great toe and gastrocnemius recession on the left. Risk and benefits were discussed including persistent infection nonhealing the wound need for additional surgery.  Thank you for the consult and the opportunity to see Ryan Shaw  Ameen Mostafa, MD [facility] [phone] 10:03 AM

## 2016-10-03 NOTE — Progress Notes (Signed)
PROGRESS NOTE  Ryan Shaw  ZOX:[id number] DOB: 02/03/1966 DOA: 10/02/2016 PCP: Ambrose FinlandValerie A Keck, NP Outpatient Specialists:  Subjective: Denies any fever or chills this morning, seen by Dr. Lajoyce Cornersuda. Patient for surgery in a.m.  Brief Narrative:  Ryan ClockWilber Scifres is a 51 y.o. male with a past medical history significant for IDDM and HTN and previous toe amputation who presents with suspected osteomyelitis.  The patient first developed infection in his left great toe last fall, was admitted to [facility] in [place] where he is from in November with sepsis, MR at the time showed no osteomyelitis, treated with vancomycin and Zosyn, debrided in the OR without amputation, developed acute renal failure requiring HD 3, thought to be either vancomycin toxicity or AIN from Zosyn, renal function gradually improved. Since then he spent getting outpatient wound care, following regularly with Dr. Gordy CouncilmanBachman of Orthopedics in [place].  In the last week, he finally made arrangements to move back to [place] to live with his daughter (he cannot work, is mostly dependent on others, has no Disability payments).  Assessment & Plan:   Principal Problem:   Chronic osteomyelitis of left foot (HCC) Active Problems:   Type 2 diabetes mellitus with diabetic neuropathy, with long-term current use of insulin (HCC)   Essential hypertension   CKD (chronic kidney disease), stage III   Osteomyelitis due to type 2 diabetes mellitus (HCC)   Acquired contracture of Achilles tendon, left    Suspected osteomyelitis:  -Nonhealing wound in the base of the left great toe, per patient recent MRI showed osteomyelitis. -Right great toe also has chronic drainage for the past several weeks -Started on ceftriaxone and Flagyl per diabetic foot ulcer protocol. -Seen by Dr. Lajoyce Cornersuda, patient for surgery in the morning  Hyperglycemia in insulin dependent diabetes: -Presented with blood sugar 564 without evidence  of acidosis or ketosis. -This is improved currently blood sugar 114 -Continue home dose of Levemir, SSI and carb modified diet. -Continue Lyrica  Hypertension:  Well controlled. -Continue metoprolol, amlodipine -Hold lisinopril    DVT prophylaxis: SQ heparin Code Status: Full Code Family Communication:  Disposition Plan:  Diet: Diet Carb Modified Fluid consistency: Thin; Room service appropriate? Yes Diet NPO time specified Except for: Sips with Meds  Consultants:   Lajoyce Cornersuda  Procedures:   None  Antimicrobials:   Rocephin and Flagyl  Objective: Vitals:   10/02/16 2045 10/02/16 2100 10/02/16 2200 10/03/16 0453  BP: 110/74 106/55 131/78 119/78  Pulse: 86 84 87 75  Resp:   16 16  Temp:   97.7 F (36.5 C) 98 F (36.7 C)  TempSrc:   Oral Oral  SpO2: 100% 97% 100% 100%  Weight:   112.5 kg (248 lb)   Height:   6\' 6"  (1.981 m)     Intake/Output Summary (Last 24 hours) at 10/03/16 1144 Last data filed at 10/03/16 0855  Gross per 24 hour  Intake              240 ml  Output              400 ml  Net             -160 ml   Filed Weights   10/02/16 1304 10/02/16 2200  Weight: 111.1 kg (245 lb) 112.5 kg (248 lb)    Examination: General exam: Appears calm and comfortable  Respiratory system: Clear to auscultation. Respiratory effort normal. Cardiovascular system: S1 & S2 heard, RRR. No JVD, murmurs, rubs, gallops or clicks.  No pedal edema. Gastrointestinal system: Abdomen is nondistended, soft and nontender. No organomegaly or masses felt. Normal bowel sounds heard. Central nervous system: Alert and oriented. No focal neurological deficits. Extremities: Symmetric 5 x 5 power. Skin: No rashes, lesions or ulcers Psychiatry: Judgement and insight appear normal. Mood & affect appropriate.   Data Reviewed: I have personally reviewed following labs and imaging studies  CBC:  Recent Labs Lab 10/02/16 1314  WBC 6.9  NEUTROABS 4.1  HGB 12.3*  HCT 38.0*  MCV 82.4    PLT 319   Basic Metabolic Panel:  Recent Labs Lab 10/02/16 1314 10/03/16 0406  NA 132* 139  K 4.9 3.9  CL 99* 104  CO2 25 27  GLUCOSE 564* 100*  BUN 28* 21*  CREATININE 1.61* 1.21  CALCIUM 9.3 8.9   GFR: Estimated Creatinine Clearance: 103.1 mL/min (by C-G formula based on SCr of 1.21 mg/dL). Liver Function Tests:  Recent Labs Lab 10/02/16 1314  AST 33  ALT 73*  ALKPHOS 216*  BILITOT 0.6  PROT 7.5  ALBUMIN 3.8   No results for input(s): LIPASE, AMYLASE in the last 168 hours. No results for input(s): AMMONIA in the last 168 hours. Coagulation Profile: No results for input(s): INR, PROTIME in the last 168 hours. Cardiac Enzymes: No results for input(s): CKTOTAL, CKMB, CKMBINDEX, TROPONINI in the last 168 hours. BNP (last 3 results) No results for input(s): PROBNP in the last 8760 hours. HbA1C: No results for input(s): HGBA1C in the last 72 hours. CBG:  Recent Labs Lab 10/02/16 2157 10/03/16 0109 10/03/16 0622 10/03/16 1135  GLUCAP 393* 126* 148* 114*   Lipid Profile: No results for input(s): CHOL, HDL, LDLCALC, TRIG, CHOLHDL, LDLDIRECT in the last 72 hours. Thyroid Function Tests: No results for input(s): TSH, T4TOTAL, FREET4, T3FREE, THYROIDAB in the last 72 hours. Anemia Panel: No results for input(s): VITAMINB12, FOLATE, FERRITIN, TIBC, IRON, RETICCTPCT in the last 72 hours. Urine analysis:    Component Value Date/Time   COLORURINE STRAW (A) 10/02/2016 1806   APPEARANCEUR CLEAR 10/02/2016 1806   LABSPEC 1.025 10/02/2016 1806   PHURINE 6.0 10/02/2016 1806   GLUCOSEU >=500 (A) 10/02/2016 1806   HGBUR NEGATIVE 10/02/2016 1806   BILIRUBINUR NEGATIVE 10/02/2016 1806   BILIRUBINUR neg 10/24/2014 1152   KETONESUR NEGATIVE 10/02/2016 1806   PROTEINUR NEGATIVE 10/02/2016 1806   UROBILINOGEN 0.2 03/30/2015 2059   NITRITE NEGATIVE 10/02/2016 1806   LEUKOCYTESUR NEGATIVE 10/02/2016 1806   Sepsis Labs: @LABRCNTIP (procalcitonin:4,lacticidven:4)  )No  results found for this or any previous visit (from the past 240 hour(s)).   Invalid input(s): PROCALCITONIN, LACTICACIDVEN   Radiology Studies: No results found.      Scheduled Meds: . amLODipine  10 mg Oral Daily  . cefTRIAXone (ROCEPHIN)  IV  2 g Intravenous Q24H   And  . metroNIDAZOLE  500 mg Oral Q8H  . chlorhexidine  60 mL Topical Once  . enoxaparin (LOVENOX) injection  40 mg Subcutaneous Q24H  . insulin aspart  0-15 Units Subcutaneous TID WC  . insulin aspart  0-5 Units Subcutaneous QHS  . insulin aspart  4 Units Subcutaneous TID WC  . insulin detemir  32 Units Subcutaneous QHS  . lisinopril  5 mg Oral Daily  . metoprolol tartrate  25 mg Oral BID  . povidone-iodine  2 application Topical Once  . pregabalin  100 mg Oral TID   Continuous Infusions:   LOS: 0 days    Time spent: 35 minutes    Herve Haug A, MD [facility] Pager [phone]  If 7PM-7AM, please contact night-coverage [email or web address] Password [email or web address] 10/03/2016, 11:44 AM

## 2016-10-03 NOTE — Clinical Social Work Note (Signed)
CSW acknowledges consult "Access Meds for Discharge." Please consult RNCM for this.  CSW signing off. Consult again if any social work needs arise.  Tkeyah Burkman, CSW [phone]  

## 2016-10-04 ENCOUNTER — Encounter (HOSPITAL_COMMUNITY)
Admission: EM | Disposition: A | Discharge status: Home / Self Care | Payer: Self-pay | Source: Home / Self Care | Attending: Internal Medicine

## 2016-10-04 ENCOUNTER — Inpatient Hospital Stay (HOSPITAL_COMMUNITY): Payer: Medicaid Other | Admitting: Certified Registered Nurse Anesthetist

## 2016-10-04 ENCOUNTER — Encounter (HOSPITAL_COMMUNITY): Payer: Self-pay | Admitting: Certified Registered Nurse Anesthetist

## 2016-10-04 DIAGNOSIS — M6702 Short Achilles tendon (acquired), left ankle: Secondary | ICD-10-CM

## 2016-10-04 HISTORY — PX: AMPUTATION: SHX166

## 2016-10-04 LAB — MRSA PCR SCREENING: MRSA BY PCR: NEGATIVE

## 2016-10-04 LAB — GLUCOSE, CAPILLARY
GLUCOSE-CAPILLARY: 282 mg/dL — AB (ref 65–99)
GLUCOSE-CAPILLARY: 114 mg/dL — AB (ref 65–99)
Glucose-Capillary: 247 mg/dL — ABNORMAL HIGH (ref 65–99)
Glucose-Capillary: 199 mg/dL — ABNORMAL HIGH (ref 65–99)
Glucose-Capillary: 294 mg/dL — ABNORMAL HIGH (ref 65–99)

## 2016-10-04 SURGERY — AMPUTATION DIGIT
Anesthesia: Regional | Site: Leg Lower | Laterality: Left

## 2016-10-04 MED ORDER — PROPOFOL 10 MG/ML IV BOLUS
INTRAVENOUS | Status: DC | PRN
  Administered 2016-10-04: 160 mg via INTRAVENOUS

## 2016-10-04 MED ORDER — INSULIN DETEMIR 100 UNIT/ML ~~LOC~~ SOLN
35.0000 [IU] | Freq: Every day | SUBCUTANEOUS | Status: DC
  Administered 2016-10-04: 35 [IU] via SUBCUTANEOUS
  Filled 2016-10-04: qty 0.35

## 2016-10-04 MED ORDER — ACETAMINOPHEN 650 MG RE SUPP: 650.0000 mg | Freq: Four times a day (QID) | RECTAL | Status: DC | PRN

## 2016-10-04 MED ORDER — ONDANSETRON HCL 4 MG/2ML IJ SOLN
4.0000 mg | Freq: Four times a day (QID) | INTRAMUSCULAR | Status: DC | PRN
  Administered 2016-10-04: 4 mg via INTRAVENOUS
  Filled 2016-10-04: qty 2

## 2016-10-04 MED ORDER — LIDOCAINE 2% (20 MG/ML) 5 ML SYRINGE
INTRAMUSCULAR | Status: DC | PRN
  Administered 2016-10-04: 40 mg via INTRAVENOUS

## 2016-10-04 MED ORDER — METHOCARBAMOL 1000 MG/10ML IJ SOLN
500.0000 mg | Freq: Four times a day (QID) | INTRAMUSCULAR | Status: DC | PRN
  Filled 2016-10-04: qty 5

## 2016-10-04 MED ORDER — 0.9 % SODIUM CHLORIDE (POUR BTL) OPTIME
TOPICAL | Status: DC | PRN
  Administered 2016-10-04: 1000 mL

## 2016-10-04 MED ORDER — MORPHINE SULFATE (PF) 2 MG/ML IV SOLN
1.0000 mg | INTRAVENOUS | Status: DC | PRN
  Administered 2016-10-04 – 2016-10-05 (×2): 2 mg via INTRAVENOUS
  Filled 2016-10-04 (×2): qty 1

## 2016-10-04 MED ORDER — POLYETHYLENE GLYCOL 3350 17 G PO PACK
17.0000 g | PACK | Freq: Every day | ORAL | Status: DC | PRN
Start: 2016-10-04 — End: 2016-10-05

## 2016-10-04 MED ORDER — FENTANYL CITRATE (PF) 100 MCG/2ML IJ SOLN
INTRAMUSCULAR | Status: AC
  Administered 2016-10-04: 100 ug
  Filled 2016-10-04: qty 2

## 2016-10-04 MED ORDER — OXYCODONE HCL 5 MG PO TABS
5.0000 mg | ORAL_TABLET | ORAL | Status: DC | PRN
  Administered 2016-10-04 – 2016-10-05 (×7): 10 mg via ORAL
  Filled 2016-10-04 (×7): qty 2

## 2016-10-04 MED ORDER — MIDAZOLAM HCL 5 MG/5ML IJ SOLN
INTRAMUSCULAR | Status: DC | PRN
  Administered 2016-10-04: 2 mg via INTRAVENOUS

## 2016-10-04 MED ORDER — KETOROLAC TROMETHAMINE 30 MG/ML IJ SOLN
30.0000 mg | Freq: Four times a day (QID) | INTRAMUSCULAR | Status: AC
  Administered 2016-10-04 – 2016-10-05 (×3): 30 mg via INTRAVENOUS
  Filled 2016-10-04 (×3): qty 1

## 2016-10-04 MED ORDER — FENTANYL CITRATE (PF) 100 MCG/2ML IJ SOLN
INTRAMUSCULAR | Status: AC
  Filled 2016-10-04: qty 2

## 2016-10-04 MED ORDER — OXYCODONE HCL 5 MG PO TABS: 5.0000 mg | ORAL_TABLET | Freq: Once | ORAL | Status: DC | PRN

## 2016-10-04 MED ORDER — PHENYLEPHRINE 40 MCG/ML (10ML) SYRINGE FOR IV PUSH (FOR BLOOD PRESSURE SUPPORT)
PREFILLED_SYRINGE | INTRAVENOUS | Status: DC | PRN
  Administered 2016-10-04 (×4): 80 ug via INTRAVENOUS

## 2016-10-04 MED ORDER — METOCLOPRAMIDE HCL 5 MG PO TABS: 5.0000 mg | ORAL_TABLET | Freq: Three times a day (TID) | ORAL | Status: DC | PRN

## 2016-10-04 MED ORDER — PROPOFOL 10 MG/ML IV BOLUS
INTRAVENOUS | Status: AC
  Filled 2016-10-04: qty 20

## 2016-10-04 MED ORDER — MAGNESIUM CITRATE PO SOLN: 1.0000 | Freq: Once | ORAL | Status: DC | PRN

## 2016-10-04 MED ORDER — SODIUM CHLORIDE 0.9 % IV SOLN
INTRAVENOUS | Status: DC | PRN
  Administered 2016-10-04: 08:00:00 via INTRAVENOUS

## 2016-10-04 MED ORDER — BISACODYL 10 MG RE SUPP: 10.0000 mg | Freq: Every day | RECTAL | Status: DC | PRN

## 2016-10-04 MED ORDER — OXYCODONE HCL 5 MG/5ML PO SOLN: 5.0000 mg | Freq: Once | ORAL | Status: DC | PRN

## 2016-10-04 MED ORDER — ONDANSETRON HCL 4 MG PO TABS: 4.0000 mg | ORAL_TABLET | Freq: Four times a day (QID) | ORAL | Status: DC | PRN

## 2016-10-04 MED ORDER — DOCUSATE SODIUM 100 MG PO CAPS
100.0000 mg | ORAL_CAPSULE | Freq: Two times a day (BID) | ORAL | Status: DC
  Administered 2016-10-04 – 2016-10-05 (×3): 100 mg via ORAL
  Filled 2016-10-04 (×3): qty 1

## 2016-10-04 MED ORDER — FENTANYL CITRATE (PF) 100 MCG/2ML IJ SOLN
INTRAMUSCULAR | Status: DC | PRN
  Administered 2016-10-04: 50 ug via INTRAVENOUS

## 2016-10-04 MED ORDER — METHOCARBAMOL 500 MG PO TABS
500.0000 mg | ORAL_TABLET | Freq: Four times a day (QID) | ORAL | Status: DC | PRN
  Administered 2016-10-04 – 2016-10-05 (×4): 500 mg via ORAL
  Filled 2016-10-04 (×4): qty 1

## 2016-10-04 MED ORDER — MIDAZOLAM HCL 2 MG/2ML IJ SOLN
INTRAMUSCULAR | Status: AC
  Filled 2016-10-04: qty 2

## 2016-10-04 MED ORDER — ONDANSETRON HCL 4 MG/2ML IJ SOLN
INTRAMUSCULAR | Status: AC
  Filled 2016-10-04: qty 2

## 2016-10-04 MED ORDER — PHENYLEPHRINE 40 MCG/ML (10ML) SYRINGE FOR IV PUSH (FOR BLOOD PRESSURE SUPPORT)
PREFILLED_SYRINGE | INTRAVENOUS | Status: AC
  Filled 2016-10-04: qty 10

## 2016-10-04 MED ORDER — FENTANYL CITRATE (PF) 100 MCG/2ML IJ SOLN: 25.0000 ug | INTRAMUSCULAR | Status: DC | PRN

## 2016-10-04 MED ORDER — BUPIVACAINE-EPINEPHRINE (PF) 0.5% -1:200000 IJ SOLN
INTRAMUSCULAR | Status: DC | PRN
  Administered 2016-10-04: 30 mL via PERINEURAL

## 2016-10-04 MED ORDER — METOCLOPRAMIDE HCL 5 MG/ML IJ SOLN
5.0000 mg | Freq: Three times a day (TID) | INTRAMUSCULAR | Status: DC | PRN
  Administered 2016-10-04: 10 mg via INTRAVENOUS
  Filled 2016-10-04: qty 2

## 2016-10-04 MED ORDER — LIDOCAINE 2% (20 MG/ML) 5 ML SYRINGE
INTRAMUSCULAR | Status: AC
  Filled 2016-10-04: qty 5

## 2016-10-04 MED ORDER — HYDROMORPHONE HCL 2 MG/ML IJ SOLN
1.0000 mg | INTRAMUSCULAR | Status: DC | PRN
  Administered 2016-10-04: 1 mg via INTRAVENOUS
  Filled 2016-10-04: qty 1

## 2016-10-04 MED ORDER — ACETAMINOPHEN 325 MG PO TABS: 650.0000 mg | ORAL_TABLET | Freq: Four times a day (QID) | ORAL | Status: DC | PRN

## 2016-10-04 SURGICAL SUPPLY — 28 items
BLADE SURG 21 STRL SS (BLADE) ×3 IMPLANT
BNDG COHESIVE 4X5 TAN STRL (GAUZE/BANDAGES/DRESSINGS) ×3 IMPLANT
BNDG ESMARK 4X9 LF (GAUZE/BANDAGES/DRESSINGS) IMPLANT
BNDG GAUZE ELAST 4 BULKY (GAUZE/BANDAGES/DRESSINGS) ×3 IMPLANT
COVER SURGICAL LIGHT HANDLE (MISCELLANEOUS) ×6 IMPLANT
DRAPE U-SHAPE 47X51 STRL (DRAPES) ×3 IMPLANT
DRSG ADAPTIC 3X8 NADH LF (GAUZE/BANDAGES/DRESSINGS) ×3 IMPLANT
DRSG PAD ABDOMINAL 8X10 ST (GAUZE/BANDAGES/DRESSINGS) ×3 IMPLANT
DURAPREP 26ML APPLICATOR (WOUND CARE) ×3 IMPLANT
ELECT REM PT RETURN 9FT ADLT (ELECTROSURGICAL) ×3
ELECTRODE REM PT RTRN 9FT ADLT (ELECTROSURGICAL) ×1 IMPLANT
GAUZE SPONGE 4X4 12PLY STRL (GAUZE/BANDAGES/DRESSINGS) ×3 IMPLANT
GLOVE BIOGEL PI IND STRL 9 (GLOVE) ×1 IMPLANT
GLOVE BIOGEL PI INDICATOR 9 (GLOVE) ×2
GLOVE SURG ORTHO 9.0 STRL STRW (GLOVE) ×3 IMPLANT
GOWN STRL REUS W/ TWL XL LVL3 (GOWN DISPOSABLE) ×2 IMPLANT
GOWN STRL REUS W/TWL XL LVL3 (GOWN DISPOSABLE) ×4
KIT BASIN OR (CUSTOM PROCEDURE TRAY) ×3 IMPLANT
KIT ROOM TURNOVER OR (KITS) ×3 IMPLANT
MANIFOLD NEPTUNE II (INSTRUMENTS) ×3 IMPLANT
NEEDLE 22X1 1/2 (OR ONLY) (NEEDLE) IMPLANT
NS IRRIG 1000ML POUR BTL (IV SOLUTION) ×3 IMPLANT
PACK ORTHO EXTREMITY (CUSTOM PROCEDURE TRAY) ×3 IMPLANT
PAD ABD 8X10 STRL (GAUZE/BANDAGES/DRESSINGS) ×3 IMPLANT
PAD ARMBOARD 7.5X6 YLW CONV (MISCELLANEOUS) ×6 IMPLANT
SUT ETHILON 2 0 PSLX (SUTURE) ×3 IMPLANT
SYR CONTROL 10ML LL (SYRINGE) IMPLANT
TOWEL OR 17X26 10 PK STRL BLUE (TOWEL DISPOSABLE) ×3 IMPLANT

## 2016-10-04 NOTE — Interval H&P Note (Signed)
History and Physical Interval Note:  10/04/2016 7:36 AM  Ryan Shaw  has presented today for surgery, with the diagnosis of OSTEOMYELITIS  The various methods of treatment have been discussed with the patient and family. After consideration of risks, benefits and other options for treatment, the patient has consented to  Procedure(s): GREAT TOE AMPUTATION GASTROCNEMIA RESECTION (Left) as a surgical intervention .  The patient's history has been reviewed, patient examined, no change in status, stable for surgery.  I have reviewed the patient's chart and labs.  Questions were answered to the patient's satisfaction.     Nadara MustardMarcus V Napoleon Monacelli

## 2016-10-04 NOTE — Consult Note (Signed)
WOC Nurse wound consult note Reason for Consult:  Patient consult received simultaneously with consult for orthopedics.  Dr. Lajoyce Cornersuda has seen and performed surgery.  WOC Nurse will not see this patient and will defer to orthopedic expertise for ongoing care and followup. WOC nursing team will not follow, but will remain available to this patient, the nursing and medical teams.  Please re-consult if needed. Thanks, Ladona MowLaurie Armenia Silveria, MSN, RN, GNP, Hans EdenCWOCN, CWON-AP, FAAN  Pager# [phone]

## 2016-10-04 NOTE — H&P (View-Only) (Signed)
ORTHOPAEDIC CONSULTATION  REQUESTING PHYSICIAN: Clydia Llano, MD  Chief Complaint: Chronic ulcer left great toe.  HPI: Ryan Shaw is a 51 y.o. male who presents with chronic ulcer left great toe. Patient has been treated in [facility] for a prolonged period of time including debridement of the chronic ulcer left great toe. Patient has failed conservative therapy and is seen for chronic ulcer with bony involvement.  Past Medical History:  Diagnosis Date  . Arthritis   . Diabetes mellitus without complication (HCC)   . Edema   . Hypercholesteremia   . Hypertension   . Neuropathy [facility])    Past Surgical History:  Procedure Laterality Date  . AMPUTATION Right 02/17/2015   Procedure: SECOND RAY AMPUTATION;  Surgeon: Kathryne Hitch, MD;  Location: [facility] OR;  Service: Orthopedics;  Laterality: Right;   Social History   Social History  . Marital status: Legally Separated    Spouse name: N/A  . Number of children: N/A  . Years of education: N/A   Social History Main Topics  . Smoking status: Current Every Day Smoker    Packs/day: 0.25    Types: Cigarettes  . Smokeless tobacco: Never Used     Comment: uses a vapor -puff-4-5 x/day  . Alcohol use No     Comment: patient states he quit drinking since he has been sick  . Drug use: Yes    Types: Marijuana  . Sexual activity: Not Asked   Other Topics Concern  . None   Social History Narrative  . None   Family History  Problem Relation Age of Onset  . Cancer Mother   . Aneurysm Father     brain   - negative except otherwise stated in the family history section Allergies  Allergen Reactions  . Other Nausea And Vomiting    NeuRemedy - Dietary Supplement   Prior to Admission medications   Medication Sig Start Date End Date Taking? Authorizing Provider  amLODipine (NORVASC) 10 MG tablet Take 10 mg by mouth daily.   Yes Historical Provider, MD  diclofenac sodium (VOLTAREN) 1 % GEL Apply 4 g  topically 3 (three) times daily as needed. Patient taking differently: Apply 4 g topically 3 (three) times daily as needed (pain).  05/22/15  Yes Ambrose Finland, NP  HYDROcodone-acetaminophen (NORCO/VICODIN) 5-325 MG tablet Take 1-2 tablets by mouth every 6 (six) hours as needed. Patient taking differently: Take 1-2 tablets by mouth every 6 (six) hours as needed for moderate pain.  10/16/15  Yes Kristen N Ward, DO  ibuprofen (ADVIL,MOTRIN) 800 MG tablet Take 1 tablet (800 mg total) by mouth every 8 (eight) hours as needed for mild pain. 10/16/15  Yes Kristen N Ward, DO  insulin detemir (LEVEMIR) 100 UNIT/ML injection Inject 26 Units into the skin at bedtime.   Yes Historical Provider, MD  lisinopril (PRINIVIL,ZESTRIL) 5 MG tablet Take 5 mg by mouth daily.   Yes Historical Provider, MD  metoprolol tartrate (LOPRESSOR) 25 MG tablet Take 25 mg by mouth 2 (two) times daily.   Yes Historical Provider, MD  pregabalin (LYRICA) 100 MG capsule Take 1 capsule (100 mg total) by mouth 3 (three) times daily. 06/14/15  Yes Quentin Angst, MD  sildenafil (VIAGRA) 25 MG tablet Take 1 tablet (25 mg total) by mouth daily as needed for erectile dysfunction. 06/07/15  Yes Olugbemiga Annitta Needs, MD  traMADol (ULTRAM) 50 MG tablet Take 1 tablet (50 mg total) by mouth every 8 (eight) hours as  needed. Patient taking differently: Take 50 mg by mouth every 8 (eight) hours as needed for moderate pain.  08/01/15  Yes Ambrose FinlandValerie A Keck, NP  TRUEPLUS LANCETS 28G MISC Check blood sugar TID & QHS 08/01/15  Yes Ambrose FinlandValerie A Keck, NP   No results found. - pertinent xrays, CT, MRI studies were reviewed and independently interpreted  Positive ROS: All other systems have been reviewed and were otherwise negative with the exception of those mentioned in the HPI and as above.  Physical Exam: General: Alert, no acute distress Psychiatric: Patient is competent for consent with normal mood and affect Lymphatic: No axillary or cervical  lymphadenopathy Cardiovascular: No pedal edema Respiratory: No cyanosis, no use of accessory musculature GI: No organomegaly, abdomen is soft and non-tender  Skin: Examination of the left great toe patient has callus a large ulcer beneath the proximal phalanx of the left great toe which probes to bone. Patient also has a superficial ulcer of the metatarsal head right great toe.   Neurologic: Patient does not have protective sensation bilateral lower extremities.   MUSCULOSKELETAL:  Examination patient has a palpable dorsalis pedis and posterior tibial pulse bilaterally. 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  Plan: Plan: We'll obtain 2 view radiographs of both feet. We will plan for surgical intervention tomorrow Sunday morning for amputation of the left great toe and gastrocnemius recession on the left. Risk and benefits were discussed including persistent infection nonhealing the wound need for additional surgery.  Thank you for the consult and the opportunity to see Ryan Shaw  Lannie Yusuf, MD [facility] [phone] 10:03 AM

## 2016-10-04 NOTE — Evaluation (Signed)
Physical Therapy Evaluation Patient Details Name: Ryan Shaw Godar MRN: [id number] DOB: 11/06/1965 Today's Date: 10/04/2016   History of Present Illness  Pt is a 51 yo male admitted through ED on 10/02/16 due to osteomyelitis or Left foot. Pt underwent a L great toe amputation on 10/03/16 at MTP joint. 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   Clinical Impression  Pt is POD 0 and in pain. Prior to admission, pt was completely independent with all activities and performing gait without assistance. Pt lives with his daughter and grandson in a single level home and they will be available to assist PRN. Pt was able to perform bed mobs and transfers with Min guard assistance this session. Assisted with positioning LE in an elevated position in order to reduce discomfort. Pt will benefit from additional acute PT services in order to address below deficits including stair negotiation prior to discharge. Pt may benefit from HHPT or outpatient services in order to maximize his outcomes.     Follow Up Recommendations Home health PT    Equipment Recommendations  Rolling walker with 5" wheels;3in1 (PT)    Recommendations for Other Services       Precautions / Restrictions Precautions Precautions: None Required Braces or Orthoses: Other Brace/Splint Other Brace/Splint: Post op shoe Restrictions Weight Bearing Restrictions: Yes RLE Weight Bearing: Touchdown weight bearing      Mobility  Bed Mobility Overal bed mobility: Modified Independent             General bed mobility comments: able to get EOB without assistance  Transfers Overall transfer level: Needs assistance Equipment used: Rolling walker (2 wheeled) Transfers: Sit to/from UGI CorporationStand;Stand Pivot Transfers Sit to Stand: Min guard Stand pivot transfers: Min guard       General transfer comment: Min guard for safety with cues for weight bearing and sequencing.    Ambulation/Gait                Stairs            Wheelchair Mobility    Modified Rankin (Stroke Patients Only)       Balance Overall balance assessment: No apparent balance deficits (not formally assessed)                                           Pertinent Vitals/Pain Pain Assessment: 0-10 Pain Score: 10-Worst pain ever Pain Location: right anterior leg where mm release was performed Pain Descriptors / Indicators: Guarding;Grimacing;Moaning;Aching;Sore Pain Intervention(s): Monitored during session;Premedicated before session;Repositioned    Home Living Family/patient expects to be discharged to:: Private residence Living Arrangements: Children;Other relatives Available Help at Discharge: Family;Available 24 hours/day Type of Home: House Home Access: Stairs to enter Entrance Stairs-Rails: None Entrance Stairs-Number of Steps: 1 Home Layout: One level Home Equipment: None Additional Comments: Lives with daughter and 712 yo grandson. Just moved into town 1 week ago.     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  RLE:  Unable to fully assess due to pain    Cervical / Trunk Assessment Cervical / Trunk Assessment: Normal  Communication   Communication: No difficulties  Cognition Arousal/Alertness: Awake/alert Behavior During Therapy: WFL for tasks assessed/performed Overall Cognitive Status: Within Functional Limits for tasks assessed                      General Comments      Exercises     Assessment/Plan    PT Assessment Patient needs  continued PT services  PT Problem List Decreased activity tolerance;Decreased balance;Decreased mobility;Decreased knowledge of use of DME;Pain       PT Treatment Interventions DME instruction;Gait training;Stair training;Functional mobility training;Therapeutic activities;Therapeutic exercise;Balance training;Patient/family education    PT Goals (Current goals can be found in the Care Plan section)  Acute Rehab PT Goals Patient Stated Goal: to get rid of the pain PT Goal Formulation: With patient Time For Goal Achievement: 10/11/16 Potential to Achieve Goals: Good    Frequency Min 3X/week   Barriers to discharge        Co-evaluation               End of Session Equipment Utilized During Treatment: Gait belt;Other (comment) (post op shoe) Activity Tolerance: Patient limited by pain Patient left: in chair;with call bell/phone within reach;with nursing/sitter in room Nurse Communication: Mobility status PT Visit Diagnosis: Difficulty in walking, not elsewhere classified (R26.2);Pain Pain - Right/Left: Right Pain - part of body: Leg         Time: 0981-1914 PT Time Calculation (min) (ACUTE ONLY): 20 min   Charges:   PT Evaluation $PT Eval Low Complexity: 1 Procedure     PT G Codes:         Colin Broach PT, DPT  [phone]  10/04/2016, 3:42 PM

## 2016-10-04 NOTE — Anesthesia Preprocedure Evaluation (Signed)
Anesthesia Evaluation  Patient identified by MRN, date of birth, ID band Patient awake    Reviewed: Allergy & Precautions, NPO status , Patient's Chart, lab work & pertinent test results  History of Anesthesia Complications Negative for: history of anesthetic complications  Airway Mallampati: II  TM Distance: >3 FB Neck ROM: Full    Dental  (+) Teeth Intact   Pulmonary Current Smoker,    breath sounds clear to auscultation       Cardiovascular hypertension, Pt. on medications and Pt. on home beta blockers (-) angina(-) Past MI and (-) CHF  Rhythm:Regular     Neuro/Psych  Neuromuscular disease    GI/Hepatic negative GI ROS, Neg liver ROS,   Endo/Other  diabetes, Type 2, Insulin Dependent  Renal/GU CRFRenal disease     Musculoskeletal  (+) Arthritis ,   Abdominal   Peds  Hematology  (+) anemia ,   Anesthesia Other Findings   Reproductive/Obstetrics                             Anesthesia Physical Anesthesia Plan  ASA: III  Anesthesia Plan: General and Regional   Post-op Pain Management: GA combined w/ Regional for post-op pain   Induction: Intravenous  Airway Management Planned: LMA  Additional Equipment: None  Intra-op Plan:   Post-operative Plan: Extubation in OR  Informed Consent: I have reviewed the patients History and Physical, chart, labs and discussed the procedure including the risks, benefits and alternatives for the proposed anesthesia with the patient or authorized representative who has indicated his/her understanding and acceptance.   Dental advisory given  Plan Discussed with: CRNA and Surgeon  Anesthesia Plan Comments:         Anesthesia Quick Evaluation

## 2016-10-04 NOTE — Progress Notes (Signed)
Orthopedic Tech Progress Note Patient Details:  Ria ClockWilber Mcelveen 08/05/1965 [id number]  Ortho Devices Type of Ortho Device: Postop shoe/boot Ortho Device/Splint Location: lle Ortho Device/Splint Interventions: Application   Evart Mcdonnell 10/04/2016, 11:04 AM

## 2016-10-04 NOTE — Transfer of Care (Signed)
Immediate Anesthesia Transfer of Care Note  Patient: Ryan Shaw  Procedure(s) Performed: Procedure(s): GREAT TOE AMPUTATION GASTROCNEMIA RESECTION (Left)  Patient Location: PACU  Anesthesia Type:GA combined with regional for post-op pain  Level of Consciousness: awake, alert , oriented and patient cooperative  Airway & Oxygen Therapy: Patient Spontanous Breathing and Patient connected to face mask oxygen  Post-op Assessment: Report given to RN, Post -op Vital signs reviewed and stable and Patient moving all extremities X 4  Post vital signs: Reviewed and stable  Last Vitals:  Vitals:   10/03/16 2053 10/04/16 0456  BP: 107/84 107/65  Pulse: 76 74  Resp: 16 18  Temp: 37 C 36.6 C    Last Pain:  Vitals:   10/04/16 0456  TempSrc: Oral  PainSc:       Patients Stated Pain Goal: 3 (10/02/16 2258)  Complications: No apparent anesthesia complications

## 2016-10-04 NOTE — Anesthesia Procedure Notes (Signed)
Anesthesia Regional Block: Popliteal block   Pre-Anesthetic Checklist: ,, timeout performed, Correct Patient, Correct Site, Correct Laterality, Correct Procedure, Correct Position, site marked, Risks and benefits discussed,  Surgical consent,  Pre-op evaluation,  At surgeon's request and post-op pain management  Laterality: Lower and Left  Prep: chloraprep       Needles:  Injection technique: Single-shot  Needle Type: Echogenic Needle          Additional Needles:   Procedures: ultrasound guided,,,,,,,,  Narrative:  Start time: 10/04/2016 8:41 AM End time: 10/04/2016 8:48 AM Injection made incrementally with aspirations every 5 mL.  Performed by: Personally   Additional Notes: H+P and labs reviewed, risks and benefits discussed with patient, procedure tolerated well without complications

## 2016-10-04 NOTE — Progress Notes (Signed)
PROGRESS NOTE  Ryan Shaw  OZH:[id number] DOB: 29-Mar-1966 DOA: 10/02/2016 PCP: Ryan Finland, NP Outpatient Specialists:  Subjective: Seen after his surgery, status post great toe amputation Denies any fever or chills.  Brief Narrative:  Ryan Shaw is a 51 y.o. male with a past medical history significant for IDDM and HTN and previous toe amputation who presents with suspected osteomyelitis.  The patient first developed infection in his left great toe last fall, was admitted to [facility] in [place] where he is from in November with sepsis, MR at the time showed no osteomyelitis, treated with vancomycin and Zosyn, debrided in the OR without amputation, developed acute renal failure requiring HD 3, thought to be either vancomycin toxicity or AIN from Zosyn, renal function gradually improved. Since then he spent getting outpatient wound care, following regularly with Dr. Gordy Shaw of Orthopedics in [place].  In the last week, he finally made arrangements to move back to [place] to live with his daughter (he cannot work, is mostly dependent on others, has no Disability payments).  Assessment & Plan:   Principal Problem:   Chronic osteomyelitis of left foot (HCC) Active Problems:   Type 2 diabetes mellitus with diabetic neuropathy, with long-term current use of insulin (HCC)   Essential hypertension   CKD (chronic kidney disease), stage III   Osteomyelitis due to type 2 diabetes mellitus (HCC)   Acquired contracture of Achilles tendon, left   Achilles tendon contracture, left    Suspected chronic osteomyelitis:  -Nonhealing wound in the base of the left great toe, per patient recent MRI showed osteomyelitis. -Right great toe also has chronic drainage for the past several weeks -Started on ceftriaxone and Flagyl per diabetic foot ulcer protocol. -Status post resection of the left great toe at the MTP joint, continue antibiotics for now.  Hyperglycemia  in insulin dependent diabetes: -Presented with blood sugar 564 without evidence of acidosis or ketosis. -This is improved currently blood sugar 114 -Continue home dose of Levemir, SSI and carb modified diet. -Blood glucose elevated this morning, will increase daily at bedtime Levemir to 35 units.  Hypertension:  Well controlled. -Continue metoprolol, amlodipine -Hold lisinopril    DVT prophylaxis: SQ heparin Code Status: Full Code Family Communication:  Disposition Plan:  Diet: Diet Carb Modified Fluid consistency: Thin; Room service appropriate? Yes  Consultants:   Ryan Shaw  Procedures:   None  Antimicrobials:   Rocephin and Flagyl  Objective: Vitals:   10/03/16 2053 10/04/16 0456 10/04/16 0921 10/04/16 0936  BP: 107/84 107/65 112/67 126/65  Pulse: 76 74 76 75  Resp: 16 18 14 15   Temp: 98.6 F (37 C) 97.8 F (36.6 C) 97.7 F (36.5 C) 98 F (36.7 C)  TempSrc: Oral Oral    SpO2: 100% 99% 100% 100%  Weight:      Height:        Intake/Output Summary (Last 24 hours) at 10/04/16 1035 Last data filed at 10/04/16 0923  Gross per 24 hour  Intake              740 ml  Output               25 ml  Net              715 ml   Filed Weights   10/02/16 1304 10/02/16 2200  Weight: 111.1 kg (245 lb) 112.5 kg (248 lb)    Examination: General exam: Appears calm and comfortable  Respiratory system: Clear to auscultation. Respiratory  effort normal. Cardiovascular system: S1 & S2 heard, RRR. No JVD, murmurs, rubs, gallops or clicks. No pedal edema. Gastrointestinal system: Abdomen is nondistended, soft and nontender. No organomegaly or masses felt. Normal bowel sounds heard. Central nervous system: Alert and oriented. No focal neurological deficits. Extremities: Symmetric 5 x 5 power. Skin: No rashes, lesions or ulcers Psychiatry: Judgement and insight appear normal. Mood & affect appropriate.   Data Reviewed: I have personally reviewed following labs and imaging  studies  CBC:  Recent Labs Lab 10/02/16 1314  WBC 6.9  NEUTROABS 4.1  HGB 12.3*  HCT 38.0*  MCV 82.4  PLT 319   Basic Metabolic Panel:  Recent Labs Lab 10/02/16 1314 10/03/16 0406  NA 132* 139  K 4.9 3.9  CL 99* 104  CO2 25 27  GLUCOSE 564* 100*  BUN 28* 21*  CREATININE 1.61* 1.21  CALCIUM 9.3 8.9   GFR: Estimated Creatinine Clearance: 103.1 mL/min (by C-G formula based on SCr of 1.21 mg/dL). Liver Function Tests:  Recent Labs Lab 10/02/16 1314  AST 33  ALT 73*  ALKPHOS 216*  BILITOT 0.6  PROT 7.5  ALBUMIN 3.8   No results for input(s): LIPASE, AMYLASE in the last 168 hours. No results for input(s): AMMONIA in the last 168 hours. Coagulation Profile: No results for input(s): INR, PROTIME in the last 168 hours. Cardiac Enzymes: No results for input(s): CKTOTAL, CKMB, CKMBINDEX, TROPONINI in the last 168 hours. BNP (last 3 results) No results for input(s): PROBNP in the last 8760 hours. HbA1C: No results for input(s): HGBA1C in the last 72 hours. CBG:  Recent Labs Lab 10/03/16 1135 10/03/16 1651 10/03/16 2132 10/04/16 0541 10/04/16 0925  GLUCAP 114* 258* 196* 247* 199*   Lipid Profile: No results for input(s): CHOL, HDL, LDLCALC, TRIG, CHOLHDL, LDLDIRECT in the last 72 hours. Thyroid Function Tests: No results for input(s): TSH, T4TOTAL, FREET4, T3FREE, THYROIDAB in the last 72 hours. Anemia Panel: No results for input(s): VITAMINB12, FOLATE, FERRITIN, TIBC, IRON, RETICCTPCT in the last 72 hours. Urine analysis:    Component Value Date/Time   COLORURINE STRAW (A) 10/02/2016 1806   APPEARANCEUR CLEAR 10/02/2016 1806   LABSPEC 1.025 10/02/2016 1806   PHURINE 6.0 10/02/2016 1806   GLUCOSEU >=500 (A) 10/02/2016 1806   HGBUR NEGATIVE 10/02/2016 1806   BILIRUBINUR NEGATIVE 10/02/2016 1806   BILIRUBINUR neg 10/24/2014 1152   KETONESUR NEGATIVE 10/02/2016 1806   PROTEINUR NEGATIVE 10/02/2016 1806   UROBILINOGEN 0.2 03/30/2015 2059   NITRITE  NEGATIVE 10/02/2016 1806   LEUKOCYTESUR NEGATIVE 10/02/2016 1806   Sepsis Labs: @LABRCNTIP (procalcitonin:4,lacticidven:4)  ) Recent Results (from the past 240 hour(s))  MRSA PCR Screening     Status: None   Collection Time: 10/04/16 12:24 AM  Result Value Ref Range Status   MRSA by PCR NEGATIVE NEGATIVE Final    Comment:        The GeneXpert MRSA Assay (FDA approved for NASAL specimens only), is one component of a comprehensive MRSA colonization surveillance program. It is not intended to diagnose MRSA infection nor to guide or monitor treatment for MRSA infections.      Invalid input(s): PROCALCITONIN, LACTICACIDVEN   Radiology Studies: Dg Foot Complete Left  Result Date: 10/03/2016 CLINICAL DATA:  Osteomyelitis due to type 2 diabetes. Patient reports foot infection with leaking cyst under great toe. EXAM: LEFT FOOT - COMPLETE 3+ VIEW COMPARISON:  Left foot radiographs 01/21/2015. Left foot MRI 02/06/2015 FINDINGS: A dressing overlies the medial aspect of the great toe. There is  soft tissue prominence and irregularity subjacent to the dressing. No evidence of bony destructive change, periosteal reaction or abnormal density. No radiopaque foreign body. Irregularity of the second digit distal tuft is unchanged in appearance from prior radiograph. The previous second toe soft tissue prominence is no longer seen. No findings suspicious for osteomyelitis elsewhere. IMPRESSION: Soft tissue prominence and irregularity about the medial aspect of the great toe, may represent cellulitis. No radiographic evidence of osteomyelitis. Electronically Signed   By: Rubye OaksMelanie  Ehinger M.D.   On: 10/03/2016 18:23   Dg Foot Complete Right  Result Date: 10/03/2016 CLINICAL DATA:  Ulcer of right foot and due to type 2 diabetes. EXAM: RIGHT FOOT COMPLETE - 3+ VIEW COMPARISON:  Foot radiographs 04/09/2015 FINDINGS: Post second toe resection. Mild demineralization of the head of the second metatarsal is  unchanged from prior exam. A dressing overlies the medial aspect of the great toe with subjacent soft tissue prominence and skin irregularity. No associated periosteal reaction, bony destructive change, or abnormal bone density to suggest osteomyelitis. No findings to suggest osteomyelitis elsewhere in the foot. IMPRESSION: Soft tissue prominence and skin irregularity about the medial first metatarsal phalangeal joint, likely sites of ulcer. No radiographic findings of osteomyelitis. Post second toe or section. Mild demineralization of the second metatarsal head is unchanged from prior exam, and doubtful for acute osteomyelitis. Electronically Signed   By: Rubye OaksMelanie  Ehinger M.D.   On: 10/03/2016 18:26        Scheduled Meds: . amLODipine  10 mg Oral Daily  . cefTRIAXone (ROCEPHIN)  IV  2 g Intravenous Q24H   And  . metroNIDAZOLE  500 mg Oral Q8H  . docusate sodium  100 mg Oral BID  . enoxaparin (LOVENOX) injection  40 mg Subcutaneous Q24H  . insulin aspart  0-15 Units Subcutaneous TID WC  . insulin aspart  0-5 Units Subcutaneous QHS  . insulin aspart  4 Units Subcutaneous TID WC  . insulin detemir  32 Units Subcutaneous QHS  . lisinopril  5 mg Oral Daily  . metoprolol tartrate  25 mg Oral BID  . pregabalin  100 mg Oral TID   Continuous Infusions:   LOS: 1 day    Time spent: 35 minutes    Nijel Flink A, MD [facility] Pager [phone]  If 7PM-7AM, please contact night-coverage [email or web address] Password [email or web address] 10/04/2016, 10:35 AM

## 2016-10-04 NOTE — Op Note (Signed)
10/02/2016 - 10/04/2016  9:16 AM  PATIENT:  Ryan Shaw    PRE-OPERATIVE DIAGNOSIS: Osteomyelitis left great toe with heel cord contracture.  POST-OPERATIVE DIAGNOSIS:  Same  PROCEDURE:  GREAT TOE AMPUTATION AT THE MTP JOINT. GASTROCNEMIA RESECTION left  SURGEON:  Nadara MustardMarcus V Duda, MD  PHYSICIAN ASSISTANT:None ANESTHESIA:   General  PREOPERATIVE INDICATIONS:  Ryan Shaw is a  51 y.o. male with a diagnosis of foot infectiion who failed conservative measures and elected for surgical management.    The risks benefits and alternatives were discussed with the patient preoperatively including but not limited to the risks of infection, bleeding, nerve injury, cardiopulmonary complications, the need for revision surgery, among others, and the patient was willing to proceed.  OPERATIVE IMPLANTS: None  OPERATIVE FINDINGS: No evidence of infection at the MTP joint  OPERATIVE PROCEDURE: Patient was brought to the operating room and underwent a general anesthetic. Patient underwent a popliteal block. After adequate anesthesia obtained patient's left lower extremity was prepped using DuraPrep draped into a sterile field a timeout was called. A medial longitudinal incision was made just proximal to 15 cm proximal to the medial malleolus. Dissection was carried down to the gastroc fascia. The gastroc fascia was released this took is dorsiflexion from 20 short of neutral to about 20 past neutral. The wound was irrigated with normal saline hemostasis was obtained and the incision was closed using 2-0 nylon. Attention was then focused on the great toe. A fishmouth incision was made just distal to the MTP joint. The great toe was amputated through the MTP joint. Electrocautery was used for hemostasis wound was irrigated with normal saline is no signs of infection at the MTP joint. The incision was closed using 2-0 nylon. A sterile dressing was applied patient was extubated taken the PACU in stable  condition.

## 2016-10-04 NOTE — Anesthesia Procedure Notes (Signed)
Procedure Name: LMA Insertion Date/Time: 10/04/2016 8:47 AM Performed by: Annabelle HarmanSMITH, Daren Yeagle A Pre-anesthesia Checklist: Patient identified, Emergency Drugs available, Suction available and Patient being monitored Patient Re-evaluated:Patient Re-evaluated prior to inductionOxygen Delivery Method: Circle system utilized Preoxygenation: Pre-oxygenation with 100% oxygen Intubation Type: IV induction Ventilation: Mask ventilation without difficulty LMA: LMA inserted LMA Size: 4.5 Number of attempts: 1 Placement Confirmation: positive ETCO2 and breath sounds checked- equal and bilateral Tube secured with: Tape Dental Injury: Teeth and Oropharynx as per pre-operative assessment

## 2016-10-04 NOTE — Progress Notes (Signed)
Patient stable during 7 a to 7 p shift, did have difficult to control pain postop.  Spoke with primary Md and orthopedist regarding burning pain in back of leg which was finally controlled after several doses of Oxy Ir and Toradol.  Patient worked with therapy and sat up in chair for some time with leg elevated.  Neurovascular checks wnl to left lower leg.

## 2016-10-05 ENCOUNTER — Encounter (HOSPITAL_COMMUNITY): Payer: Self-pay | Admitting: Orthopedic Surgery

## 2016-10-05 LAB — CBC
HCT: 33.5 % — ABNORMAL LOW (ref 39.0–52.0)
Hemoglobin: 10.6 g/dL — ABNORMAL LOW (ref 13.0–17.0)
MCH: 26.4 pg (ref 26.0–34.0)
MCHC: 31.6 g/dL (ref 30.0–36.0)
MCV: 83.5 fL (ref 78.0–100.0)
PLATELETS: 233 10*3/uL (ref 150–400)
RBC: 4.01 MIL/uL — AB (ref 4.22–5.81)
RDW: 13.9 % (ref 11.5–15.5)
WBC: 5.6 10*3/uL (ref 4.0–10.5)

## 2016-10-05 LAB — BASIC METABOLIC PANEL
Anion gap: 3 — ABNORMAL LOW (ref 5–15)
BUN: 23 mg/dL — AB (ref 6–20)
CALCIUM: 8.6 mg/dL — AB (ref 8.9–10.3)
CO2: 25 mmol/L (ref 22–32)
CREATININE: 1.52 mg/dL — AB (ref 0.61–1.24)
Chloride: 106 mmol/L (ref 101–111)
GFR, EST NON AFRICAN AMERICAN: 52 mL/min — AB (ref >60)
Glucose, Bld: 371 mg/dL — ABNORMAL HIGH (ref 65–99)
Potassium: 4.8 mmol/L (ref 3.5–5.1)
SODIUM: 134 mmol/L — AB (ref 135–145)

## 2016-10-05 LAB — GLUCOSE, CAPILLARY
GLUCOSE-CAPILLARY: 315 mg/dL — AB (ref 65–99)
GLUCOSE-CAPILLARY: 218 mg/dL — AB (ref 65–99)

## 2016-10-05 MED ORDER — HYDROCODONE-ACETAMINOPHEN 5-325 MG PO TABS: 1.0000 | ORAL_TABLET | Freq: Four times a day (QID) | ORAL | 0 refills | Status: DC | PRN

## 2016-10-05 MED ORDER — DOXYCYCLINE HYCLATE 100 MG PO TABS: 100.0000 mg | ORAL_TABLET | Freq: Two times a day (BID) | ORAL | 0 refills | Status: DC

## 2016-10-05 MED ORDER — INSULIN DETEMIR 100 UNIT/ML ~~LOC~~ SOLN: 40.0000 [IU] | Freq: Every day | SUBCUTANEOUS | Status: DC

## 2016-10-05 MED ORDER — CEPHALEXIN 500 MG PO CAPS: 500.0000 mg | ORAL_CAPSULE | Freq: Three times a day (TID) | ORAL | 0 refills | Status: DC

## 2016-10-05 NOTE — Care Management Note (Addendum)
Case Management Note  Patient Details  Name: Ryan ClockWilber Shaw MRN: [id number] Date of Birth: 11/07/1965  Subjective/Objective:      51 yr old male s/p left great toe amputation.               Action/Plan: Case manager provided patient with MATCH letter for his oral antibiotics and explained how it works. DME has been delivered to his room.    Expected Discharge Date:  10/05/16               Expected Discharge Plan:   Home/self care  In-House Referral:  NA  Discharge planning Services  CM Consult, MATCH Program  Post Acute Care Choice:  Durable Medical Equipment Choice offered to:     DME Arranged:  3-N-1, Walker rolling DME Agency:  [facility]  HH Arranged:  NA HH Agency:     Status of Service:  Completed, signed off  If discussed at Long Length of Stay Meetings, dates discussed:    Additional Comments:  Durenda GuthrieBrady, Kennede Lusk Naomi, RN 10/05/2016, 12:19 PM

## 2016-10-05 NOTE — Progress Notes (Signed)
Discharge instructions (including medications) discussed with and copy provided to patient/caregiver 

## 2016-10-05 NOTE — Progress Notes (Signed)
Physical Therapy Treatment Patient Details Name: Ryan Shaw MRN: [id number] DOB: 1966-04-11 Today's Date: 10/05/2016    History of Present Illness Pt is a 51 yo male admitted through ED on 10/02/16 due to osteomyelitis or Left foot. Pt underwent a L great toe amputation on 10/03/16 at MTP joint. 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     PT Comments    Patient is progressing toward mobility goals and able to ambulate 198ft and negotiate steps. Current plan remains appropriate.    Follow Up Recommendations  Home health PT     Equipment Recommendations  Rolling walker with 5" wheels;3in1 (PT)    Recommendations for Other Services       Precautions / Restrictions Precautions Precautions: None Required Braces or Orthoses: Other Brace/Splint Other Brace/Splint: Post op shoe Restrictions Weight Bearing Restrictions: Yes RLE Weight Bearing: Touchdown weight bearing    Mobility  Bed Mobility Overal bed mobility: Modified Independent             General bed mobility comments: able to get EOB without assistance  Transfers Overall transfer level: Needs assistance Equipment used: Rolling walker (2 wheeled) Transfers: Sit to/from [employer] Sit to Stand: Supervision         General transfer comment: cues for hand placement  Ambulation/Gait Ambulation/Gait assistance: Supervision;Min guard Ambulation Distance (Feet): 100 Feet Assistive device: Rolling walker (2 wheeled) Gait Pattern/deviations: Step-to pattern;Decreased stance time - left;Decreased step length - right;Decreased weight shift to left     General Gait Details: cues for shortening R step length to decrease stance time on L LE and for upright posture to improve WB through bilat UE; LOB X1 with min guard for safety but pt able to recover without assist   Stairs Stairs: Yes   Stair Management: No rails;Step to  pattern;Backwards;With walker Number of Stairs:  (1 step X2) General stair comments: cues for sequencing and technique  Wheelchair Mobility    Modified Rankin (Stroke Patients Only)       Balance Overall balance assessment: No apparent balance deficits (not formally assessed)                                  Cognition Arousal/Alertness: Awake/alert Behavior During Therapy: WFL for tasks assessed/performed Overall Cognitive Status: Within Functional Limits for tasks assessed                      Exercises      General Comments        Pertinent Vitals/Pain Pain Assessment: 0-10 Pain Score: 7  Pain Location: L calf with standing Pain Descriptors / Indicators: Guarding;Grimacing;Sore Pain Intervention(s): Limited activity within patient's tolerance;Monitored during session;Premedicated before session;Repositioned    Home Living                      Prior Function            PT Goals (current goals can now be found in the care plan section) Acute Rehab PT Goals Patient Stated Goal: go home PT Goal Formulation: With patient Time For Goal Achievement: 10/11/16 Potential to Achieve Goals: Good Progress towards PT goals: Progressing toward goals    Frequency    Min 3X/week      PT Plan Current plan remains appropriate    Co-evaluation  End of Session Equipment Utilized During Treatment: Gait belt;Other (comment) (post op shoe) Activity Tolerance: Patient tolerated treatment well Patient left: in chair;with call bell/phone within reach Nurse Communication: Mobility status PT Visit Diagnosis: Difficulty in walking, not elsewhere classified (R26.2);Pain Pain - Right/Left: Left Pain - part of body: Leg     Time: [id number] PT Time Calculation (min) (ACUTE ONLY): 26 min  Charges:  $Gait Training: 23-37 mins                    G Codes:       Derek MoundKellyn R Voshon Petro Starlette Thurow, PTA Pager: [phone]    10/05/2016, 9:12 AM

## 2016-10-05 NOTE — Discharge Summary (Signed)
Physician Discharge Summary  Ryan Shaw ZOX:[id number] DOB: [birth year] DOA: 10/02/2016  PCP: Ambrose Finland, NP  Admit date: 10/02/2016 Discharge date: 10/05/2016  Admitted From: Home Disposition: Home  Recommendations for Outpatient Follow-up:  1. Follow up with PCP in 1-2 weeksFollow-up with Dr. good in 1 week. 2. Please obtain BMP/CBC in one week  Home Health: NA Equipment/Devices:NA  Discharge Condition: Stable CODE STATUS: Full Code Diet recommendation: Diet Carb Modified Fluid consistency: Thin; Room service appropriate? Yes Diet - low sodium heart healthy  Brief/Interim Summary: Ryan Johnsonis a 50 y.o.malewith a past medical history significant for IDDM and HTN and previous toe amputationwho presents with suspected osteomyelitis.  The patient first developed infection in his left great toe last fall, was admitted to [facility] in [place] where he is from in November with sepsis, MR at the time showed no osteomyelitis,treated with vancomycin and Zosyn, debrided in the OR without amputation, developed acute renal failure requiring HD 3, thought to be either vancomycin toxicity or AIN from Zosyn, renal function gradually improved. Since then he spent getting outpatient wound care, following regularly with Dr. Gordy Councilman of Orthopedics in [place]. In the last week, he finally made arrangements to move back to [place] to live with his daughter (he cannot work, is mostly dependent on others, has no Disability payments).   Discharge Diagnoses:  Principal Problem:   Chronic osteomyelitis of left foot (HCC) Active Problems:   Type 2 diabetes mellitus with diabetic neuropathy, with long-term current use of insulin (HCC)   Essential hypertension   CKD (chronic kidney disease), stage III   Osteomyelitis due to type 2 diabetes mellitus (HCC)   Acquired contracture of Achilles tendon, left   Achilles tendon contracture, left   Suspected chronic  osteomyelitis of the left great toe base: -Nonhealing wound in the base of the left great toe, per patient recent MRI showed osteomyelitis. -Right great toe also has chronic drainage for the past several weeks -Started on ceftriaxone and Flagyl per diabetic foot ulcer protocol. -Status post resection of the left great toe at the MTP joint and by Dr. Lajoyce Corners on 10/04/2016. -Sent home on doxycycline and Keflex for 7 more days. Vicodin 15 pills prescribed.  Hyperglycemia in insulin dependent diabetes: -Presented with blood sugar 564 without evidence of acidosis or ketosis. -This is improved currently blood sugar 114 -Continue home dose of Levemir, SSI and carb modified diet. -Blood glucose still elevated will increase Levemir insulin to 40 units. (Was on 26 units of Lantus at home) -Needs follow-up with PCP for diabetes.  Hypertension: Well controlled. -Continue metoprolol, amlodipine -Hold lisinopril  CKD stage III -At baseline of 1.5.  Discharge Instructions  Discharge Instructions    Diet - low sodium heart healthy    Complete by:  As directed    Increase activity slowly    Complete by:  As directed      Allergies as of 10/05/2016      Reactions   Other Nausea And Vomiting   NeuRemedy - Dietary Supplement      Medication List    TAKE these medications   amLODipine 10 MG tablet Commonly known as:  NORVASC Take 10 mg by mouth daily.   cephALEXin 500 MG capsule Commonly known as:  KEFLEX Take 1 capsule (500 mg total) by mouth 3 (three) times daily.   diclofenac sodium 1 % Gel Commonly known as:  VOLTAREN Apply 4 g topically 3 (three) times daily as needed. What changed:  reasons to take  this   doxycycline 100 MG tablet Commonly known as:  VIBRA-TABS Take 1 tablet (100 mg total) by mouth 2 (two) times daily.   HYDROcodone-acetaminophen 5-325 MG tablet Commonly known as:  NORCO/VICODIN Take 1 tablet by mouth every 6 (six) hours as needed. What changed:  how much  to take   ibuprofen 800 MG tablet Commonly known as:  ADVIL,MOTRIN Take 1 tablet (800 mg total) by mouth every 8 (eight) hours as needed for mild pain.   insulin detemir 100 UNIT/ML injection Commonly known as:  LEVEMIR Inject 0.4 mLs (40 Units total) into the skin at bedtime. What changed:  how much to take   lisinopril 5 MG tablet Commonly known as:  PRINIVIL,ZESTRIL Take 5 mg by mouth daily.   metoprolol tartrate 25 MG tablet Commonly known as:  LOPRESSOR Take 25 mg by mouth 2 (two) times daily.   pregabalin 100 MG capsule Commonly known as:  LYRICA Take 1 capsule (100 mg total) by mouth 3 (three) times daily.   sildenafil 25 MG tablet Commonly known as:  VIAGRA Take 1 tablet (25 mg total) by mouth daily as needed for erectile dysfunction.   traMADol 50 MG tablet Commonly known as:  ULTRAM Take 1 tablet (50 mg total) by mouth every 8 (eight) hours as needed. What changed:  reasons to take this   TRUEPLUS LANCETS 28G Misc Check blood sugar TID & QHS            Durable Medical Equipment        Start     Ordered   10/05/16 1026  For home use only DME 3 n 1  Once     10/05/16 1026   10/05/16 1026  For home use only DME Walker rolling  Once    Question:  Patient needs a walker to treat with the following condition  Answer:  Great toe amputation status, left (HCC)   10/05/16 1026     Follow-up Information    Nadara MustardMarcus V Duda, MD Follow up in 2 week(s).   Specialty:  Orthopedic Surgery Contact information: [address] [place] [address] [phone]        [facility]. Schedule an appointment as soon as possible for a visit.   Why:  First available date Contact information: [address] [place] [phone]         Allergies  Allergen Reactions  . Other Nausea And Vomiting    NeuRemedy - Dietary Supplement    Consultations: Treatment Team:  Nadara MustardMarcus Duda V,  MD  Procedures done by Dr. Lajoyce Cornersuda on 10/04/16 GREAT TOE AMPUTATION AT THE MTP JOINT. GASTROCNEMIA RESECTION left  Radiological studies: Dg Foot Complete Left  Result Date: 10/03/2016 CLINICAL DATA:  Osteomyelitis due to type 2 diabetes. Patient reports foot infection with leaking cyst under great toe. EXAM: LEFT FOOT - COMPLETE 3+ VIEW COMPARISON:  Left foot radiographs 01/21/2015. Left foot MRI 02/06/2015 FINDINGS: A dressing overlies the medial aspect of the great toe. There is soft tissue prominence and irregularity subjacent to the dressing. No evidence of bony destructive change, periosteal reaction or abnormal density. No radiopaque foreign body. Irregularity of the second digit distal tuft is unchanged in appearance from prior radiograph. The previous second toe soft tissue prominence is no longer seen. No findings suspicious for osteomyelitis elsewhere. IMPRESSION: Soft tissue prominence and irregularity about the medial aspect of the great toe, may represent cellulitis. No radiographic evidence of osteomyelitis. Electronically  Signed   By: Rubye Oaks M.D.   On: 10/03/2016 18:23   Dg Foot Complete Right  Result Date: 10/03/2016 CLINICAL DATA:  Ulcer of right foot and due to type 2 diabetes. EXAM: RIGHT FOOT COMPLETE - 3+ VIEW COMPARISON:  Foot radiographs 04/09/2015 FINDINGS: Post second toe resection. Mild demineralization of the head of the second metatarsal is unchanged from prior exam. A dressing overlies the medial aspect of the great toe with subjacent soft tissue prominence and skin irregularity. No associated periosteal reaction, bony destructive change, or abnormal bone density to suggest osteomyelitis. No findings to suggest osteomyelitis elsewhere in the foot. IMPRESSION: Soft tissue prominence and skin irregularity about the medial first metatarsal phalangeal joint, likely sites of ulcer. No radiographic findings of osteomyelitis. Post second toe or section. Mild demineralization of  the second metatarsal head is unchanged from prior exam, and doubtful for acute osteomyelitis. Electronically Signed   By: Rubye Oaks M.D.   On: 10/03/2016 18:26    Subjective:  Discharge Exam: Vitals:   10/04/16 2012 10/05/16 0025 10/05/16 0330 10/05/16 0832  BP: (!) 107/57 104/63 107/67 (!) 102/57  Pulse: 71 68 73 79  Resp: 16 16 16    Temp: 98.1 F (36.7 C) 98.1 F (36.7 C) 98 F (36.7 C)   TempSrc: Oral Oral Oral   SpO2: 100% 100% 100% 100%  Weight:      Height:       General: Pt is alert, awake, not in acute distress Cardiovascular: RRR, S1/S2 +, no rubs, no gallops Respiratory: CTA bilaterally, no wheezing, no rhonchi Abdominal: Soft, NT, ND, bowel sounds + Extremities: no edema, no cyanosis   The results of significant diagnostics from this hospitalization (including imaging, microbiology, ancillary and laboratory) are listed below for reference.    Microbiology: Recent Results (from the past 240 hour(s))  MRSA PCR Screening     Status: None   Collection Time: 10/04/16 12:24 AM  Result Value Ref Range Status   MRSA by PCR NEGATIVE NEGATIVE Final    Comment:        The GeneXpert MRSA Assay (FDA approved for NASAL specimens only), is one component of a comprehensive MRSA colonization surveillance program. It is not intended to diagnose MRSA infection nor to guide or monitor treatment for MRSA infections.      Labs: BNP (last 3 results) No results for input(s): BNP in the last 8760 hours. Basic Metabolic Panel:  Recent Labs Lab 10/02/16 1314 10/03/16 0406 10/05/16 0651  NA 132* 139 134*  K 4.9 3.9 4.8  CL 99* 104 106  CO2 25 27 25   GLUCOSE 564* 100* 371*  BUN 28* 21* 23*  CREATININE 1.61* 1.21 1.52*  CALCIUM 9.3 8.9 8.6*   Liver Function Tests:  Recent Labs Lab 10/02/16 1314  AST 33  ALT 73*  ALKPHOS 216*  BILITOT 0.6  PROT 7.5  ALBUMIN 3.8   No results for input(s): LIPASE, AMYLASE in the last 168 hours. No results for  input(s): AMMONIA in the last 168 hours. CBC:  Recent Labs Lab 10/02/16 1314 10/05/16 0651  WBC 6.9 5.6  NEUTROABS 4.1  --   HGB 12.3* 10.6*  HCT 38.0* 33.5*  MCV 82.4 83.5  PLT 319 233   Cardiac Enzymes: No results for input(s): CKTOTAL, CKMB, CKMBINDEX, TROPONINI in the last 168 hours. BNP: Invalid input(s): POCBNP CBG:  Recent Labs Lab 10/04/16 0925 10/04/16 1256 10/04/16 1629 10/04/16 2058 10/05/16 0611  GLUCAP 199* 282* 114* 294* 315*  D-Dimer No results for input(s): DDIMER in the last 72 hours. Hgb A1c No results for input(s): HGBA1C in the last 72 hours. Lipid Profile No results for input(s): CHOL, HDL, LDLCALC, TRIG, CHOLHDL, LDLDIRECT in the last 72 hours. Thyroid function studies No results for input(s): TSH, T4TOTAL, T3FREE, THYROIDAB in the last 72 hours.  Invalid input(s): FREET3 Anemia work up No results for input(s): VITAMINB12, FOLATE, FERRITIN, TIBC, IRON, RETICCTPCT in the last 72 hours. Urinalysis    Component Value Date/Time   COLORURINE STRAW (A) 10/02/2016 1806   APPEARANCEUR CLEAR 10/02/2016 1806   LABSPEC 1.025 10/02/2016 1806   PHURINE 6.0 10/02/2016 1806   GLUCOSEU >=500 (A) 10/02/2016 1806   HGBUR NEGATIVE 10/02/2016 1806   BILIRUBINUR NEGATIVE 10/02/2016 1806   BILIRUBINUR neg 10/24/2014 1152   KETONESUR NEGATIVE 10/02/2016 1806   PROTEINUR NEGATIVE 10/02/2016 1806   UROBILINOGEN 0.2 03/30/2015 2059   NITRITE NEGATIVE 10/02/2016 1806   LEUKOCYTESUR NEGATIVE 10/02/2016 1806   Sepsis Labs Invalid input(s): PROCALCITONIN,  WBC,  LACTICIDVEN Microbiology Recent Results (from the past 240 hour(s))  MRSA PCR Screening     Status: None   Collection Time: 10/04/16 12:24 AM  Result Value Ref Range Status   MRSA by PCR NEGATIVE NEGATIVE Final    Comment:        The GeneXpert MRSA Assay (FDA approved for NASAL specimens only), is one component of a comprehensive MRSA colonization surveillance program. It is not intended to  diagnose MRSA infection nor to guide or monitor treatment for MRSA infections.      Time coordinating discharge: Over 30 minutes  SIGNED:   Clint Lipps, MD  [facility] 10/05/2016, 11:10 AM Pager   If 7PM-7AM, please contact night-coverage [email or web address] Password [email or web address]

## 2016-10-05 NOTE — Progress Notes (Signed)
Patient ID: [name], male   DOB: 01/22/1966, 51 y.o.   MRN: [id number] Patient denies any pain this morning. The dressing is clean and dry. Patient was given instructions for dorsiflexion exercises of the ankle. He may discharge to home from an orthopedic standpoint touchdown weightbearing on the left keep the dressing dry I will follow-up in the office in 1 week.

## 2016-10-06 NOTE — Anesthesia Postprocedure Evaluation (Addendum)
Anesthesia Post Note  Patient: Ryan Shaw  Procedure(s) Performed: Procedure(s) (LRB): GREAT TOE AMPUTATION GASTROCNEMIA RESECTION (Left)  Patient location during evaluation: PACU Anesthesia Type: Regional Level of consciousness: awake and alert Pain management: pain level controlled Vital Signs Assessment: post-procedure vital signs reviewed and stable Respiratory status: spontaneous breathing, nonlabored ventilation, respiratory function stable and patient connected to nasal cannula oxygen Cardiovascular status: blood pressure returned to baseline and stable Postop Assessment: no signs of nausea or vomiting Anesthetic complications: no       Last Vitals:  Vitals:   10/05/16 0330 10/05/16 0832  BP: 107/67 (!) 102/57  Pulse: 73 79  Resp: 16   Temp: 36.7 C     Last Pain:  Vitals:   10/05/16 1141  TempSrc:   PainSc: 7                  Kandiss Ihrig

## 2016-10-08 ENCOUNTER — Ambulatory Visit: Payer: Medicaid Other | Attending: Family Medicine | Admitting: Family Medicine

## 2016-10-08 ENCOUNTER — Encounter: Payer: Self-pay | Admitting: Family Medicine

## 2016-10-08 VITALS — BP 130/75 | HR 104 | Temp 98.1°F | Ht 78.0 in | Wt 245.0 lb

## 2016-10-08 DIAGNOSIS — L97509 Non-pressure chronic ulcer of other part of unspecified foot with unspecified severity: Secondary | ICD-10-CM

## 2016-10-08 DIAGNOSIS — Z794 Long term (current) use of insulin: Secondary | ICD-10-CM

## 2016-10-08 DIAGNOSIS — Z89419 Acquired absence of unspecified great toe: Secondary | ICD-10-CM | POA: Diagnosis not present

## 2016-10-08 DIAGNOSIS — Z89421 Acquired absence of other right toe(s): Secondary | ICD-10-CM | POA: Insufficient documentation

## 2016-10-08 DIAGNOSIS — Z89412 Acquired absence of left great toe: Secondary | ICD-10-CM | POA: Insufficient documentation

## 2016-10-08 DIAGNOSIS — E1142 Type 2 diabetes mellitus with diabetic polyneuropathy: Secondary | ICD-10-CM | POA: Diagnosis not present

## 2016-10-08 DIAGNOSIS — E1140 Type 2 diabetes mellitus with diabetic neuropathy, unspecified: Secondary | ICD-10-CM

## 2016-10-08 DIAGNOSIS — I10 Essential (primary) hypertension: Secondary | ICD-10-CM | POA: Insufficient documentation

## 2016-10-08 DIAGNOSIS — L97511 Non-pressure chronic ulcer of other part of right foot limited to breakdown of skin: Secondary | ICD-10-CM | POA: Diagnosis not present

## 2016-10-08 DIAGNOSIS — E7800 Pure hypercholesterolemia, unspecified: Secondary | ICD-10-CM | POA: Diagnosis not present

## 2016-10-08 DIAGNOSIS — E11621 Type 2 diabetes mellitus with foot ulcer: Secondary | ICD-10-CM | POA: Insufficient documentation

## 2016-10-08 DIAGNOSIS — IMO0002 Reserved for concepts with insufficient information to code with codable children: Secondary | ICD-10-CM

## 2016-10-08 LAB — CBC WITH DIFFERENTIAL/PLATELET
Basophils Absolute: 0 cells/uL (ref 0–200)
Basophils Relative: 0 %
EOS ABS: 365 {cells}/uL (ref 15–500)
Eosinophils Relative: 5 %
HEMATOCRIT: 35.7 % — AB (ref 38.5–50.0)
Hemoglobin: 11.7 g/dL — ABNORMAL LOW (ref 13.2–17.1)
LYMPHS PCT: 30 %
Lymphs Abs: 2190 cells/uL (ref 850–3900)
MCH: 27.3 pg (ref 27.0–33.0)
MCHC: 32.8 g/dL (ref 32.0–36.0)
MCV: 83.2 fL (ref 80.0–100.0)
MPV: 9.8 fL (ref 7.5–12.5)
Monocytes Absolute: 584 cells/uL (ref 200–950)
Monocytes Relative: 8 %
NEUTROS ABS: 4161 {cells}/uL (ref 1500–7800)
Neutrophils Relative %: 57 %
PLATELETS: 330 10*3/uL (ref 140–400)
RBC: 4.29 MIL/uL (ref 4.20–5.80)
RDW: 14.2 % (ref 11.0–15.0)
WBC: 7.3 10*3/uL (ref 3.8–10.8)

## 2016-10-08 LAB — COMPLETE METABOLIC PANEL WITH GFR
ALT: 35 U/L (ref 9–46)
AST: 18 U/L (ref 10–35)
Albumin: 3.8 g/dL (ref 3.6–5.1)
Alkaline Phosphatase: 198 U/L — ABNORMAL HIGH (ref 40–115)
BUN: 20 mg/dL (ref 7–25)
CALCIUM: 9.6 mg/dL (ref 8.6–10.3)
CO2: 23 mmol/L (ref 20–31)
Chloride: 103 mmol/L (ref 98–110)
Creat: 1.33 mg/dL (ref 0.70–1.33)
GFR, EST AFRICAN AMERICAN: 72 mL/min (ref >=60)
GFR, EST NON AFRICAN AMERICAN: 62 mL/min (ref >=60)
Glucose, Bld: 263 mg/dL — ABNORMAL HIGH (ref 65–99)
POTASSIUM: 4.6 mmol/L (ref 3.5–5.3)
Sodium: 136 mmol/L (ref 135–146)
Total Bilirubin: 0.4 mg/dL (ref 0.2–1.2)
Total Protein: 7.6 g/dL (ref 6.1–8.1)

## 2016-10-08 LAB — GLUCOSE, POCT (MANUAL RESULT ENTRY): POC GLUCOSE: 326 mg/dL — AB (ref 70–99)

## 2016-10-08 LAB — POCT GLYCOSYLATED HEMOGLOBIN (HGB A1C): Hemoglobin A1C: 13.5

## 2016-10-08 MED ORDER — LISINOPRIL 5 MG PO TABS: 5.0000 mg | ORAL_TABLET | Freq: Every day | ORAL | 3 refills | Status: DC

## 2016-10-08 MED ORDER — INSULIN DETEMIR 100 UNIT/ML ~~LOC~~ SOLN: 50.0000 [IU] | Freq: Every day | SUBCUTANEOUS | 3 refills | Status: DC

## 2016-10-08 MED FILL — ?LISINOPRIL 5 MG TABLET: 5 | 30 days supply | Qty: 30 | Fill #0

## 2016-10-08 NOTE — Patient Instructions (Signed)

## 2016-10-08 NOTE — Progress Notes (Signed)
Med refills- tramadol, lisinopril

## 2016-10-08 NOTE — Progress Notes (Signed)
Subjective:  Patient ID: Ryan Shaw, male    DOB: 28-Apr-1966  Age: 51 y.o. MRN: [id number]  CC: Hospitalization Follow-up (left big toe amputation); Diabetes; Peripheral Neuropathy; Hypertension; and Hyperlipidemia   HPI Ryan Shaw is a 51 year old male with a history of hypertension, type 2 diabetes mellitus (A1c 13.5, diabetic neuropathy, right second toe amputation hyperlipidemia who presents for follow-up from hospitalization status post left great toe amputation by Dr. Lajoyce Corners on 10/04/16 due to left great toe osteomyelitis.  He previously had a chronic non healing left great toe chronic ulcer s/p post debridement in [place] and been on IV antibiotics; send MRI revealed osteomyelitis. He has an upcoming appointment with orthopedics-Dr. Lajoyce Corners next week.  He has no other complaints today and his blood sugar is 326 in the clinic today despite taking 40 units of beer last night.  Past Medical History:  Diagnosis Date  . Arthritis   . Diabetes mellitus without complication (HCC)   . Edema   . Hypercholesteremia   . Hypertension   . Neuropathy [facility])    Past Surgical History:  Procedure Laterality Date  . AMPUTATION Right 02/17/2015   Procedure: SECOND RAY AMPUTATION;  Surgeon: Kathryne Hitch, MD;  Location: [facility] OR;  Service: Orthopedics;  Laterality: Right;  . AMPUTATION Left 10/04/2016   Procedure: GREAT TOE AMPUTATION GASTROCNEMIA RESECTION;  Surgeon: Nadara Mustard, MD;  Location: [facility] OR;  Service: Orthopedics;  Laterality: Left;    Allergies  Allergen Reactions  . Other Nausea And Vomiting    NeuRemedy - Dietary Supplement     Outpatient Medications Prior to Visit  Medication Sig Dispense Refill  . amLODipine (NORVASC) 10 MG tablet Take 10 mg by mouth daily.    . cephALEXin (KEFLEX) 500 MG capsule Take 1 capsule (500 mg total) by mouth 3 (three) times daily. 21 capsule 0  . doxycycline (VIBRA-TABS) 100 MG tablet Take 1 tablet (100 mg total) by mouth 2 (two) times  daily. 14 tablet 0  . HYDROcodone-acetaminophen (NORCO/VICODIN) 5-325 MG tablet Take 1 tablet by mouth every 6 (six) hours as needed. 15 tablet 0  . insulin detemir (LEVEMIR) 100 UNIT/ML injection Inject 0.4 mLs (40 Units total) into the skin at bedtime.    . metoprolol tartrate (LOPRESSOR) 25 MG tablet Take 25 mg by mouth 2 (two) times daily.    . pregabalin (LYRICA) 100 MG capsule Take 1 capsule (100 mg total) by mouth 3 (three) times daily. 270 capsule 3  . TRUEPLUS LANCETS 28G MISC Check blood sugar TID & QHS 100 each 5  . lisinopril (PRINIVIL,ZESTRIL) 5 MG tablet Take 5 mg by mouth daily.    . diclofenac sodium (VOLTAREN) 1 % GEL Apply 4 g topically 3 (three) times daily as needed. (Patient not taking: Reported on 10/08/2016) 100 g 4  . ibuprofen (ADVIL,MOTRIN) 800 MG tablet Take 1 tablet (800 mg total) by mouth every 8 (eight) hours as needed for mild pain. (Patient not taking: Reported on 10/08/2016) 30 tablet 0  . sildenafil (VIAGRA) 25 MG tablet Take 1 tablet (25 mg total) by mouth daily as needed for erectile dysfunction. (Patient not taking: Reported on 10/08/2016) 30 tablet 3  . traMADol (ULTRAM) 50 MG tablet Take 1 tablet (50 mg total) by mouth every 8 (eight) hours as needed. (Patient not taking: Reported on 10/08/2016) 90 tablet 1   No facility-administered medications prior to visit.     ROS Review of Systems  Constitutional: Negative for activity change and appetite change.  HENT:  Negative for sinus pressure and sore throat.   Eyes: Negative for visual disturbance.  Respiratory: Negative for cough, chest tightness and shortness of breath.   Cardiovascular: Negative for chest pain and leg swelling.  Gastrointestinal: Negative for abdominal distention, abdominal pain, constipation and diarrhea.  Endocrine: Negative.   Genitourinary: Negative for dysuria.  Musculoskeletal: Negative for joint swelling and myalgias.  Skin: Positive for wound. Negative for rash.  Allergic/Immunologic:  Negative.   Neurological: Positive for numbness. Negative for weakness and light-headedness.  Psychiatric/Behavioral: Negative for dysphoric mood and suicidal ideas.    Objective:  BP 130/75 (BP Location: Right Arm, Patient Position: Sitting, Cuff Size: Small)   Pulse (!) 104   Temp 98.1 F (36.7 C) (Oral)   Ht 6\' 6"  (1.981 m)   Wt 245 lb (111.1 kg)   SpO2 97%   BMI 28.31 kg/m   BP/Weight 10/08/2016 10/05/2016 10/02/2016  Systolic BP 130 102 -  Diastolic BP 75 57 -  Wt. (Lbs) 245 - 248  BMI 28.31 - 28.66      Physical Exam  Constitutional: He is oriented to person, place, and time. He appears well-developed and well-nourished.  Cardiovascular: Normal rate, normal heart sounds and intact distal pulses.   No murmur heard. Pulmonary/Chest: Effort normal and breath sounds normal. He has no wheezes. He has no rales. He exhibits no tenderness.  Abdominal: Soft. Bowel sounds are normal. He exhibits no distension and no mass. There is no tenderness.  Musculoskeletal:  Left leg wrapped in Ace and age and in a cam walker. Right foot with amputation of second toe, dry scaly skin on foot, ulcer limited to the skin on medial aspect of right big toe with foul-smelling discharge.  Neurological: He is alert and oriented to person, place, and time.    Lab Results  Component Value Date   HGBA1C 13.5 10/08/2016   CMP Latest Ref Rng & Units 10/05/2016 10/03/2016 10/02/2016  Glucose 65 - 99 mg/dL 440(H) 474(Q) 595(GL)  BUN 6 - 20 mg/dL 87(F) 64(P) 32(R)  Creatinine 0.61 - 1.24 mg/dL 5.18(A) 4.16 6.06(T)  Sodium 135 - 145 mmol/L 134(L) 139 132(L)  Potassium 3.5 - 5.1 mmol/L 4.8 3.9 4.9  Chloride 101 - 111 mmol/L 106 104 99(L)  CO2 22 - 32 mmol/L 25 27 25   Calcium 8.9 - 10.3 mg/dL 0.1(S) 8.9 9.3  Total Protein 6.5 - 8.1 g/dL - - 7.5  Total Bilirubin 0.3 - 1.2 mg/dL - - 0.6  Alkaline Phos 38 - 126 U/L - - 216(H)  AST 15 - 41 U/L - - 33  ALT 17 - 63 U/L - - 73(H)    CBC Latest Ref Rng & Units  10/05/2016 10/02/2016 10/15/2015  WBC 4.0 - 10.5 K/uL 5.6 6.9 5.8  Hemoglobin 13.0 - 17.0 g/dL 10.6(L) 12.3(L) 15.1  Hematocrit 39.0 - 52.0 % 33.5(L) 38.0(L) 44.1  Platelets 150 - 400 K/uL 233 319 199    Assessment & Plan:   1. Type 2 diabetes mellitus with diabetic neuropathy, with long-term current use of insulin (HCC) Uncontrolled with A1c of 13.5 Increase Levemir to 50 units at bedtime Review blood sugar log at next visit Stressed importance of compliance with medication and a Diabetic diet - Glucose (CBG) - COMPLETE METABOLIC PANEL WITH GFR - CBC with Differential/Platelet  2. Diabetic peripheral neuropathy (HCC) Continue Lyrica  3. Essential hypertension Controlled - lisinopril (PRINIVIL,ZESTRIL) 5 MG tablet; Take 1 tablet (5 mg total) by mouth daily.  Dispense: 30 tablet; Refill: 3  4. Great toe amputation status, unspecified laterality (HCC) Scheduled to see orthopedics next week Continue Keflex Continue with cam walker  5. Diabetic ulcer of other part of right foot associated with type 2 diabetes mellitus, limited to breakdown of skin (HCC) He is at high risk of diabetic ulcers due to neuropathy and poor foot care Dressing change performed in the clinic He is currently on antibiotics - AMB referral to wound care center   Meds ordered this encounter  Medications  . lisinopril (PRINIVIL,ZESTRIL) 5 MG tablet    Sig: Take 1 tablet (5 mg total) by mouth daily.    Dispense:  30 tablet    Refill:  3    Follow-up: Return in about 3 weeks (around 10/29/2016) for Follow-up on diabetes mellitus and foot ulcer.   Jaclyn ShaggyEnobong Amao MD

## 2016-10-16 NOTE — Progress Notes (Signed)
Office Visit Note   Patient: Ryan Shaw           Date of Birth: 04/26/1966           MRN: [id number] Visit Date: 10/19/2016              Requested by: No referring provider defined for this encounter. PCP: Jaclyn ShaggyEnobong, Amao, MD  Chief Complaint  Patient presents with  . Left Foot - Routine Post Op    10/08/16 10/04/16 GT Amputation at MTP joint left foot       HPI: 10/04/16 GT Amputation at MTP joint left foot  Patient is a 51 y.o male who presents today for left foot great toe amputation at MTP joint. He is approximately two weeks post op.  Patient is in a post op shoe and full weight bearing. He states that he had to go to the ER this weekend due to a fall and increased pain at his surgical site. The incision is closed and stitches are intact but the skin is dark and discolored. There is no drainage. The pt also had a gastroc recession and the incision here is well approximated and the leg is slightly swollen. Rodena MedinAutumn L Forrest, RMA     Assessment & Plan: Visit Diagnoses:  1. Amputee, great toe, left (HCC)     Plan: Harvest the sutures today. Patient given instructions work on heel cord stretching moisturizing lotion stiff soled walking shoe he is given a prescription for [employer] for extra-depth shoes custom orthotics with a spacer on the left.  Follow-Up Instructions: Return in about 4 weeks (around 11/16/2016).   Ortho Exam Examination the incision is healed nicely. There is no redness no cellulitis no signs of infection. He has good dorsiflexion of the ankle. ROS: Complete review of systems negative. Imaging: Dg Ribs Unilateral W/chest Left  Result Date: 10/18/2016 CLINICAL DATA:  Status post fall with trauma to the left chest wall . EXAM: LEFT RIBS AND CHEST - 3+ VIEW COMPARISON:  None. FINDINGS: No fracture or other bone lesions are seen involving the ribs. There is no evidence of pneumothorax or pleural effusion. Both lungs are clear. Heart size and mediastinal contours  are within normal limits. IMPRESSION: Negative. Electronically Signed   By: Ted Mcalpineobrinka  Dimitrova M.D.   On: 10/18/2016 16:13   Dg Shoulder Left  Result Date: 10/18/2016 CLINICAL DATA:  Pain status post fall with trauma to the left shoulder. EXAM: LEFT SHOULDER - 2+ VIEW COMPARISON:  None. FINDINGS: There is no evidence of fracture or dislocation. There is no evidence of arthropathy or other focal bone abnormality. Soft tissues are unremarkable. IMPRESSION: Negative. Electronically Signed   By: Ted Mcalpineobrinka  Dimitrova M.D.   On: 10/18/2016 16:13    Labs: Lab Results  Component Value Date   HGBA1C 13.5 10/08/2016   HGBA1C 7.60 08/01/2015   HGBA1C 8.50 04/09/2015   ESRSEDRATE 41 (H) 10/02/2016   CRP <0.80 10/02/2016    Orders:  No orders of the defined types were placed in this encounter.  No orders of the defined types were placed in this encounter.    Procedures: No procedures performed  Clinical Data: No additional findings.  Subjective: Review of Systems  Objective: Vital Signs: Ht 6\' 6"  (1.981 m)   Wt 225 lb (102.1 kg)   BMI 26.00 kg/m   Specialty Comments:  No specialty comments available.  PMFS History: Patient Active Problem List   Diagnosis Date Noted  . Amputee, great toe, left (HCC) 10/08/2016  .  Diabetic foot ulcer (HCC) 10/08/2016  . Achilles tendon contracture, left   . 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 Acquired contracture of Achilles tendon, left   . Essential hypertension 10/02/2016  . CKD (chronic kidney disease), stage III 10/02/2016  . Bilateral chronic knee pain 07/09/2015  . Chronic osteomyelitis of left foot (HCC) 02/16/2015  . Type 2 diabetes mellitus with diabetic neuropathy, with long-term current use of insulin (HCC) 11/01/2014  . Diabetic peripheral neuropathy (HCC) 10/25/2014   Past Medical History:  Diagnosis Date  . Arthritis   . Diabetes mellitus without complication (HCC)   . Edema   . Hypercholesteremia   .  Hypertension   . Neuropathy (HCC)     Family History  Problem Relation Age of Onset  . Cancer Mother   . Aneurysm Father     brain    Past Surgical History:  Procedure Laterality Date  . AMPUTATION Right 02/17/2015   Procedure: SECOND RAY AMPUTATION;  Surgeon: Kathryne Hitch, MD;  Location: [facility] OR;  Service: Orthopedics;  Laterality: Right;  . AMPUTATION Left 10/04/2016   Procedure: GREAT TOE AMPUTATION GASTROCNEMIA RESECTION;  Surgeon: Nadara Mustard, MD;  Location: [facility] OR;  Service: Orthopedics;  Laterality: Left;   Social History   Occupational History  . Not on file.   Social History Main Topics  . Smoking status: Current Every Day Smoker    Packs/day: 0.25    Types: Cigarettes  . Smokeless tobacco: Never Used     Comment: 8 cigs daily  . Alcohol use No     Comment: patient states he quit drinking since he has been sick  . Drug use: Yes    Types: Marijuana     Comment: 2 weeks  . Sexual activity: Not on file

## 2016-10-18 ENCOUNTER — Encounter (HOSPITAL_COMMUNITY): Payer: Self-pay | Admitting: *Deleted

## 2016-10-18 ENCOUNTER — Emergency Department (HOSPITAL_COMMUNITY)
Admission: EM | Admit: 2016-10-18 | Discharge: 2016-10-18 | Disposition: A | Discharge status: Home / Self Care | Payer: Medicaid Other | Attending: Emergency Medicine | Admitting: Emergency Medicine

## 2016-10-18 ENCOUNTER — Emergency Department (HOSPITAL_COMMUNITY): Payer: Medicaid Other

## 2016-10-18 DIAGNOSIS — Z794 Long term (current) use of insulin: Secondary | ICD-10-CM | POA: Diagnosis not present

## 2016-10-18 DIAGNOSIS — W19XXXA Unspecified fall, initial encounter: Secondary | ICD-10-CM

## 2016-10-18 DIAGNOSIS — R739 Hyperglycemia, unspecified: Secondary | ICD-10-CM

## 2016-10-18 DIAGNOSIS — I129 Hypertensive chronic kidney disease with stage 1 through stage 4 chronic kidney disease, or unspecified chronic kidney disease: Secondary | ICD-10-CM | POA: Insufficient documentation

## 2016-10-18 DIAGNOSIS — E1122 Type 2 diabetes mellitus with diabetic chronic kidney disease: Secondary | ICD-10-CM | POA: Insufficient documentation

## 2016-10-18 DIAGNOSIS — Z79899 Other long term (current) drug therapy: Secondary | ICD-10-CM | POA: Insufficient documentation

## 2016-10-18 DIAGNOSIS — N183 Chronic kidney disease, stage 3 (moderate): Secondary | ICD-10-CM | POA: Insufficient documentation

## 2016-10-18 DIAGNOSIS — E1140 Type 2 diabetes mellitus with diabetic neuropathy, unspecified: Secondary | ICD-10-CM | POA: Diagnosis not present

## 2016-10-18 DIAGNOSIS — L97519 Non-pressure chronic ulcer of other part of right foot with unspecified severity: Secondary | ICD-10-CM | POA: Diagnosis not present

## 2016-10-18 DIAGNOSIS — E1165 Type 2 diabetes mellitus with hyperglycemia: Secondary | ICD-10-CM | POA: Diagnosis not present

## 2016-10-18 DIAGNOSIS — Z89412 Acquired absence of left great toe: Secondary | ICD-10-CM | POA: Diagnosis not present

## 2016-10-18 DIAGNOSIS — F17210 Nicotine dependence, cigarettes, uncomplicated: Secondary | ICD-10-CM | POA: Insufficient documentation

## 2016-10-18 DIAGNOSIS — E11621 Type 2 diabetes mellitus with foot ulcer: Secondary | ICD-10-CM | POA: Diagnosis not present

## 2016-10-18 DIAGNOSIS — R0781 Pleurodynia: Secondary | ICD-10-CM | POA: Diagnosis present

## 2016-10-18 LAB — URINALYSIS, ROUTINE W REFLEX MICROSCOPIC
BILIRUBIN URINE: NEGATIVE
Bacteria, UA: NONE SEEN
Glucose, UA: >=500 mg/dL — AB
Hgb urine dipstick: NEGATIVE
Ketones, ur: NEGATIVE mg/dL
Leukocytes, UA: NEGATIVE
NITRITE: NEGATIVE
PH: 6 (ref 5.0–8.0)
Protein, ur: NEGATIVE mg/dL
SPECIFIC GRAVITY, URINE: 1.018 (ref 1.005–1.030)
Squamous Epithelial / LPF: NONE SEEN

## 2016-10-18 LAB — CBC WITH DIFFERENTIAL/PLATELET
BASOS ABS: 0 10*3/uL (ref 0.0–0.1)
Basophils Relative: 1 %
Eosinophils Absolute: 0.3 10*3/uL (ref 0.0–0.7)
Eosinophils Relative: 4 %
HEMATOCRIT: 35.5 % — AB (ref 39.0–52.0)
HEMOGLOBIN: 11.8 g/dL — AB (ref 13.0–17.0)
LYMPHS PCT: 32 %
Lymphs Abs: 2 10*3/uL (ref 0.7–4.0)
MCH: 27 pg (ref 26.0–34.0)
MCHC: 33.2 g/dL (ref 30.0–36.0)
MCV: 81.2 fL (ref 78.0–100.0)
MONO ABS: 0.5 10*3/uL (ref 0.1–1.0)
Monocytes Relative: 7 %
NEUTROS ABS: 3.6 10*3/uL (ref 1.7–7.7)
NEUTROS PCT: 56 %
Platelets: 356 10*3/uL (ref 150–400)
RBC: 4.37 MIL/uL (ref 4.22–5.81)
RDW: 13.4 % (ref 11.5–15.5)
WBC: 6.4 10*3/uL (ref 4.0–10.5)

## 2016-10-18 LAB — COMPREHENSIVE METABOLIC PANEL
ALBUMIN: 3.4 g/dL — AB (ref 3.5–5.0)
ALK PHOS: 171 U/L — AB (ref 38–126)
ALT: 51 U/L (ref 17–63)
AST: 25 U/L (ref 15–41)
Anion gap: 5 (ref 5–15)
BILIRUBIN TOTAL: 0.3 mg/dL (ref 0.3–1.2)
BUN: 23 mg/dL — AB (ref 6–20)
CALCIUM: 8.9 mg/dL (ref 8.9–10.3)
CO2: 22 mmol/L (ref 22–32)
CREATININE: 1.31 mg/dL — AB (ref 0.61–1.24)
Chloride: 105 mmol/L (ref 101–111)
GFR calc Af Amer: >60 mL/min (ref >60)
GLUCOSE: 356 mg/dL — AB (ref 65–99)
Potassium: 4.2 mmol/L (ref 3.5–5.1)
Sodium: 132 mmol/L — ABNORMAL LOW (ref 135–145)
TOTAL PROTEIN: 7.3 g/dL (ref 6.5–8.1)

## 2016-10-18 LAB — I-STAT VENOUS BLOOD GAS, ED
ACID-BASE DEFICIT: 4 mmol/L — AB (ref 0.0–2.0)
Bicarbonate: 21.6 mmol/L (ref 20.0–28.0)
O2 SAT: 25 %
PH VEN: 7.354 (ref 7.250–7.430)
TCO2: 23 mmol/L (ref 0–100)
pCO2, Ven: 38.7 mmHg — ABNORMAL LOW (ref 44.0–60.0)
pO2, Ven: 18 mmHg — CL (ref 32.0–45.0)

## 2016-10-18 LAB — CBG MONITORING, ED: Glucose-Capillary: 308 mg/dL — ABNORMAL HIGH (ref 65–99)

## 2016-10-18 MED ORDER — OXYCODONE-ACETAMINOPHEN 5-325 MG PO TABS
2.0000 | ORAL_TABLET | Freq: Once | ORAL | Status: AC
  Administered 2016-10-18: 2 via ORAL
  Filled 2016-10-18: qty 2

## 2016-10-18 MED ORDER — HYDROCODONE-ACETAMINOPHEN 5-325 MG PO TABS: 1.0000 | ORAL_TABLET | ORAL | 0 refills | Status: DC | PRN

## 2016-10-18 MED ORDER — SODIUM CHLORIDE 0.9 % IV BOLUS (SEPSIS)
1000.0000 mL | Freq: Once | INTRAVENOUS | Status: AC
  Administered 2016-10-18: 1000 mL via INTRAVENOUS

## 2016-10-18 NOTE — ED Triage Notes (Addendum)
Pt reports he fall last night at home and his Lt shoulder pain and lt side pain. CBG per EMS 460 the patient takes Insuline at home.

## 2016-10-18 NOTE — ED Notes (Signed)
cbg was 308 

## 2016-10-18 NOTE — ED Provider Notes (Signed)
[facility]-EMERGENCY DEPT Provider Note   CSN: [id number] Arrival date & time: 10/18/16  1405     History   Chief Complaint Chief Complaint  Patient presents with  . Fall  . Post-op Problem    HPI Ryan Shaw is a 51 y.o. male.  HPI  Presents with concern for fall last night with left shoulder and left sided rib pain.  Describes it as mechanical fall, went from sitting to standing and lost balance falling on left side. Reports pain is severe.  Denies head trauma, headache, LOC, neck pain, back pain, numbness or weakness.    Reports he has foot pain since his great toe amputation 2 weeks ago, ran out of pain medication 1 week ago. Has not removed dressing. Is scheduled to see Dr. Lajoyce Corners tomorrow.  No fevers. Glucose running high, only taking levemir.  Past Medical History:  Diagnosis Date  . Arthritis   . Diabetes mellitus without complication (HCC)   . Edema   . Hypercholesteremia   . Hypertension   . Neuropathy [facility])     Patient Active Problem List   Diagnosis Date Noted  . Great toe amputation status, unspecified laterality (HCC) 10/08/2016  . Diabetic foot ulcer (HCC) 10/08/2016  . Achilles tendon contracture, left   . 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 Acquired contracture of Achilles tendon, left   . Essential hypertension 10/02/2016  . CKD (chronic kidney disease), stage III 10/02/2016  . Bilateral chronic knee pain 07/09/2015  . Chronic osteomyelitis of left foot (HCC) 02/16/2015  . Type 2 diabetes mellitus with diabetic neuropathy, with long-term current use of insulin (HCC) 11/01/2014  . Diabetic peripheral neuropathy (HCC) 10/25/2014    Past Surgical History:  Procedure Laterality Date  . AMPUTATION Right 02/17/2015   Procedure: SECOND RAY AMPUTATION;  Surgeon: Kathryne Hitch, MD;  Location: [facility] OR;  Service: Orthopedics;  Laterality: Right;  . AMPUTATION Left 10/04/2016   Procedure: GREAT TOE AMPUTATION GASTROCNEMIA RESECTION;   Surgeon: Nadara Mustard, MD;  Location: [facility] OR;  Service: Orthopedics;  Laterality: Left;       Home Medications    Prior to Admission medications   Medication Sig Start Date End Date Taking? Authorizing Provider  amLODipine (NORVASC) 10 MG tablet Take 10 mg by mouth daily.   Yes Historical Provider, MD  atorvastatin (LIPITOR) 40 MG tablet Take 40 mg by mouth every evening. 07/20/16  Yes Historical Provider, MD  cephALEXin (KEFLEX) 500 MG capsule Take 1 capsule (500 mg total) by mouth 3 (three) times daily. 10/05/16  Yes Clydia Llano, MD  insulin detemir (LEVEMIR) 100 UNIT/ML injection Inject 0.5 mLs (50 Units total) into the skin at bedtime. 10/08/16  Yes Jaclyn Shaggy, MD  lisinopril (PRINIVIL,ZESTRIL) 5 MG tablet Take 1 tablet (5 mg total) by mouth daily. 10/08/16  Yes Jaclyn Shaggy, MD  metoprolol tartrate (LOPRESSOR) 25 MG tablet Take 25 mg by mouth 2 (two) times daily.   Yes Historical Provider, MD  pregabalin (LYRICA) 100 MG capsule Take 1 capsule (100 mg total) by mouth 3 (three) times daily. 06/14/15  Yes Quentin Angst, MD  HYDROcodone-acetaminophen (NORCO/VICODIN) 5-325 MG tablet Take 1-2 tablets by mouth every 4 (four) hours as needed. 10/18/16   Alvira Monday, MD  ibuprofen (ADVIL,MOTRIN) 800 MG tablet Take 1 tablet (800 mg total) by mouth every 8 (eight) hours as needed for mild pain. Patient not taking: Reported on 10/08/2016 10/16/15   Layla Maw Ward, DO  sildenafil (VIAGRA) 25  MG tablet Take 1 tablet (25 mg total) by mouth daily as needed for erectile dysfunction. Patient not taking: Reported on 10/08/2016 06/07/15   Quentin Angstlugbemiga E Jegede, MD  traMADol (ULTRAM) 50 MG tablet Take 1 tablet (50 mg total) by mouth every 8 (eight) hours as needed. Patient not taking: Reported on 10/08/2016 08/01/15   Ambrose FinlandValerie A Keck, NP    Family History Family History  Problem Relation Age of Onset  . Cancer Mother   . Aneurysm Father     brain    Social History Social History  Substance Use Topics    . Smoking status: Current Every Day Smoker    Packs/day: 0.25    Types: Cigarettes  . Smokeless tobacco: Never Used     Comment: 8 cigs daily  . Alcohol use No     Comment: patient states he quit drinking since he has been sick     Allergies   Other   Review of Systems Review of Systems  Constitutional: Negative for fever.  HENT: Negative for sore throat.   Eyes: Negative for visual disturbance.  Respiratory: Negative for shortness of breath.   Cardiovascular: Positive for chest pain (left rib pain).  Gastrointestinal: Negative for abdominal pain, nausea and vomiting.  Genitourinary: Negative for difficulty urinating.  Musculoskeletal: Positive for arthralgias. Negative for back pain and neck stiffness.  Skin: Negative for rash.  Neurological: Negative for syncope, weakness, numbness and headaches.     Physical Exam Updated Vital Signs BP 112/70 (BP Location: Right Arm)   Pulse 65   Temp 97.7 F (36.5 C) (Oral)   Resp 14   Ht 6\' 6"  (1.981 m)   Wt 225 lb (102.1 kg)   SpO2 100%   BMI 26.00 kg/m   Physical Exam  Constitutional: He is oriented to person, place, and time. He appears well-developed and well-nourished. No distress.  HENT:  Head: Normocephalic and atraumatic.  Eyes: Conjunctivae and EOM are normal.  Neck: Normal range of motion.  Cardiovascular: Normal rate, regular rhythm, normal heart sounds and intact distal pulses.  Exam reveals no gallop and no friction rub.   No murmur heard. Pulmonary/Chest: Effort normal and breath sounds normal. No respiratory distress. He has no wheezes. He has no rales. He exhibits tenderness (left).  Abdominal: Soft. He exhibits no distension. There is no tenderness. There is no guarding.  Musculoskeletal: He exhibits no edema.       Left shoulder: He exhibits tenderness and bony tenderness. He exhibits no deformity, no laceration and normal pulse.       Cervical back: He exhibits no bony tenderness.       Thoracic back:  He exhibits no bony tenderness.       Lumbar back: He exhibits no bony tenderness.  Neurological: He is alert and oriented to person, place, and time.  Skin: Skin is warm and dry. He is not diaphoretic.  Right foot ulcer first MTP, no drainage, no erythema  Left foot s/p toe amputation, incision intact, clean, dry, with exception of area most laterally slight moist border No erythema, no drainage  Incision left lower leg clean, dry, intact   Nursing note and vitals reviewed.    ED Treatments / Results  Labs (all labs ordered are listed, but only abnormal results are displayed) Labs Reviewed  CBC WITH DIFFERENTIAL/PLATELET - Abnormal; Notable for the following:       Result Value   Hemoglobin 11.8 (*)    HCT 35.5 (*)    All  other components within normal limits  COMPREHENSIVE METABOLIC PANEL - Abnormal; Notable for the following:    Sodium 132 (*)    Glucose, Bld 356 (*)    BUN 23 (*)    Creatinine, Ser 1.31 (*)    Albumin 3.4 (*)    Alkaline Phosphatase 171 (*)    All other components within normal limits  URINALYSIS, ROUTINE W REFLEX MICROSCOPIC - Abnormal; Notable for the following:    Color, Urine STRAW (*)    Glucose, UA >=500 (*)    All other components within normal limits  I-STAT VENOUS BLOOD GAS, ED - Abnormal; Notable for the following:    pCO2, Ven 38.7 (*)    pO2, Ven 18.0 (*)    Acid-base deficit 4.0 (*)    All other components within normal limits  CBG MONITORING, ED - Abnormal; Notable for the following:    Glucose-Capillary 308 (*)    All other components within normal limits    EKG  EKG Interpretation None       Radiology Dg Ribs Unilateral W/chest Left  Result Date: 10/18/2016 CLINICAL DATA:  Status post fall with trauma to the left chest wall . EXAM: LEFT RIBS AND CHEST - 3+ VIEW COMPARISON:  None. FINDINGS: No fracture or other bone lesions are seen involving the ribs. There is no evidence of pneumothorax or pleural effusion. Both lungs are  clear. Heart size and mediastinal contours are within normal limits. IMPRESSION: Negative. Electronically Signed   By: Ted Mcalpine M.D.   On: 10/18/2016 16:13   Dg Shoulder Left  Result Date: 10/18/2016 CLINICAL DATA:  Pain status post fall with trauma to the left shoulder. EXAM: LEFT SHOULDER - 2+ VIEW COMPARISON:  None. FINDINGS: There is no evidence of fracture or dislocation. There is no evidence of arthropathy or other focal bone abnormality. Soft tissues are unremarkable. IMPRESSION: Negative. Electronically Signed   By: Ted Mcalpine M.D.   On: 10/18/2016 16:13    Procedures Procedures (including critical care time)  Medications Ordered in ED Medications  oxyCODONE-acetaminophen (PERCOCET/ROXICET) 5-325 MG per tablet 2 tablet (2 tablets Oral Given 10/18/16 1427)  sodium chloride 0.9 % bolus 1,000 mL (0 mLs Intravenous Stopped 10/18/16 1724)     Initial Impression / Assessment and Plan / ED Course  I have reviewed the triage vital signs and the nursing notes.  Pertinent labs & imaging results that were available during my care of the patient were reviewed by me and considered in my medical decision making (see chart for details).     51yo male with history of htn, hld, DM, neuropathy, presents with concern for fall last night with left shoulder pain, rib pain and continuing foot pain.  Patient with hyperglycemia, no signs of DKA.  XR without signs of fracture.  Foot incision without erythema, no drainage. Good pulses bilateral, similar temp bilaterally. Patient without signs of systemic infection including no fevers, no leukocytosis.  Do not feel patient requires emergent surgery, admission, or antibiotics and given patient with appointment tomorrow with Dr. Lajoyce Corners, recommend keeping this close follow up for wound check and returning if systemic symptoms develop. Reviewed pt in database, rx only for lyrica (althogh our records and pt reports getting hydrocodone after  surgery)-gave 4 tablets to help pt tonight. Patient discharged in stable condition with understanding of reasons to return.    Final Clinical Impressions(s) / ED Diagnoses   Final diagnoses:  Fall, initial encounter  Hyperglycemia  Status post amputation of great toe,  left [facility])  Diabetic ulcer of right foot associated with type 2 diabetes mellitus, unspecified part of foot, unspecified ulcer stage [facility])    New Prescriptions Discharge Medication List as of 10/18/2016  5:25 PM       Alvira Monday, MD 10/18/16 2258

## 2016-10-19 ENCOUNTER — Ambulatory Visit (INDEPENDENT_AMBULATORY_CARE_PROVIDER_SITE_OTHER): Payer: Self-pay | Admitting: Orthopedic Surgery

## 2016-10-19 ENCOUNTER — Encounter (INDEPENDENT_AMBULATORY_CARE_PROVIDER_SITE_OTHER): Payer: Self-pay | Admitting: Orthopedic Surgery

## 2016-10-19 VITALS — Ht 78.0 in | Wt 225.0 lb

## 2016-10-19 DIAGNOSIS — Z89412 Acquired absence of left great toe: Secondary | ICD-10-CM

## 2016-10-20 ENCOUNTER — Encounter (HOSPITAL_BASED_OUTPATIENT_CLINIC_OR_DEPARTMENT_OTHER): Payer: Medicaid Other | Attending: Surgery

## 2016-10-20 ENCOUNTER — Telehealth (INDEPENDENT_AMBULATORY_CARE_PROVIDER_SITE_OTHER): Payer: Self-pay | Admitting: *Deleted

## 2016-10-20 DIAGNOSIS — M868X7 Other osteomyelitis, ankle and foot: Secondary | ICD-10-CM | POA: Insufficient documentation

## 2016-10-20 DIAGNOSIS — E11621 Type 2 diabetes mellitus with foot ulcer: Secondary | ICD-10-CM | POA: Insufficient documentation

## 2016-10-20 DIAGNOSIS — Z89412 Acquired absence of left great toe: Secondary | ICD-10-CM | POA: Diagnosis not present

## 2016-10-20 DIAGNOSIS — F17218 Nicotine dependence, cigarettes, with other nicotine-induced disorders: Secondary | ICD-10-CM | POA: Diagnosis not present

## 2016-10-20 DIAGNOSIS — L97512 Non-pressure chronic ulcer of other part of right foot with fat layer exposed: Secondary | ICD-10-CM | POA: Diagnosis not present

## 2016-10-20 DIAGNOSIS — L97321 Non-pressure chronic ulcer of left ankle limited to breakdown of skin: Secondary | ICD-10-CM | POA: Diagnosis not present

## 2016-10-20 DIAGNOSIS — L97522 Non-pressure chronic ulcer of other part of left foot with fat layer exposed: Secondary | ICD-10-CM | POA: Diagnosis not present

## 2016-10-20 DIAGNOSIS — I10 Essential (primary) hypertension: Secondary | ICD-10-CM | POA: Diagnosis not present

## 2016-10-20 DIAGNOSIS — E1140 Type 2 diabetes mellitus with diabetic neuropathy, unspecified: Secondary | ICD-10-CM | POA: Insufficient documentation

## 2016-10-20 NOTE — Telephone Encounter (Signed)
Patient called in this afternoon in regards to his appointment he had with the wound center. He was seen today in their office and they told him we need to send them a letter stating that we release him to be treated by them? His CB # [phone]. Thank you

## 2016-10-22 NOTE — Telephone Encounter (Signed)
Call patient and let him know that we can have him follow-up with the wound center once he has healed from his surgeries. The wound center does not want to follow-up until he has completely resolve his surgical intervention.

## 2016-10-22 NOTE — Telephone Encounter (Signed)
Called pt to advise of message below. He states that the incision is slightly open. He had stitches removed for a great toe amputation Monday. Advised pt that he should not bear weight on the foot or only through the heel to keep pressure off the incision, he can apply an over the counter abx oint to the incision daily and elevate higher than his heart. Advised for him to look at the incision when he does his wound care this evening and to call in the morning with an update and that if he needs to be seen we can work him in the office any time. The pt voiced understanding and he will call with any update in the morning.

## 2016-10-22 NOTE — Telephone Encounter (Signed)
Does this make sense to you?

## 2016-10-27 ENCOUNTER — Other Ambulatory Visit: Payer: Self-pay | Admitting: Surgery

## 2016-10-27 ENCOUNTER — Ambulatory Visit (HOSPITAL_COMMUNITY)
Admission: RE | Admit: 2016-10-27 | Discharge: 2016-10-27 | Disposition: A | Discharge status: Home / Self Care | Payer: Medicaid Other | Source: Ambulatory Visit | Attending: Surgery | Admitting: Surgery

## 2016-10-27 DIAGNOSIS — M869 Osteomyelitis, unspecified: Secondary | ICD-10-CM

## 2016-10-27 DIAGNOSIS — M19071 Primary osteoarthritis, right ankle and foot: Secondary | ICD-10-CM | POA: Insufficient documentation

## 2016-10-28 DIAGNOSIS — E11621 Type 2 diabetes mellitus with foot ulcer: Secondary | ICD-10-CM | POA: Diagnosis not present

## 2016-11-03 ENCOUNTER — Telehealth: Payer: Self-pay

## 2016-11-03 ENCOUNTER — Encounter (HOSPITAL_BASED_OUTPATIENT_CLINIC_OR_DEPARTMENT_OTHER): Payer: Medicaid Other | Attending: Surgery

## 2016-11-03 ENCOUNTER — Encounter (INDEPENDENT_AMBULATORY_CARE_PROVIDER_SITE_OTHER): Payer: Self-pay | Admitting: Orthopedic Surgery

## 2016-11-03 ENCOUNTER — Ambulatory Visit (INDEPENDENT_AMBULATORY_CARE_PROVIDER_SITE_OTHER): Payer: Self-pay | Admitting: Orthopedic Surgery

## 2016-11-03 VITALS — Ht 78.0 in | Wt 225.0 lb

## 2016-11-03 DIAGNOSIS — L97522 Non-pressure chronic ulcer of other part of left foot with fat layer exposed: Secondary | ICD-10-CM | POA: Insufficient documentation

## 2016-11-03 DIAGNOSIS — E1140 Type 2 diabetes mellitus with diabetic neuropathy, unspecified: Secondary | ICD-10-CM | POA: Diagnosis not present

## 2016-11-03 DIAGNOSIS — F17210 Nicotine dependence, cigarettes, uncomplicated: Secondary | ICD-10-CM | POA: Insufficient documentation

## 2016-11-03 DIAGNOSIS — L97512 Non-pressure chronic ulcer of other part of right foot with fat layer exposed: Secondary | ICD-10-CM | POA: Diagnosis not present

## 2016-11-03 DIAGNOSIS — Z89412 Acquired absence of left great toe: Secondary | ICD-10-CM

## 2016-11-03 DIAGNOSIS — E11621 Type 2 diabetes mellitus with foot ulcer: Secondary | ICD-10-CM | POA: Insufficient documentation

## 2016-11-03 DIAGNOSIS — I10 Essential (primary) hypertension: Secondary | ICD-10-CM | POA: Diagnosis not present

## 2016-11-03 NOTE — Telephone Encounter (Signed)
-----   Message from Jaclyn Shaggy, MD sent at 10/09/2016  1:28 PM EST ----- Labs are stable; he has an elevated alkaline phosphatase which could be due to his recent osteomyelitis.

## 2016-11-03 NOTE — Progress Notes (Signed)
Office Visit Note   Patient: Ryan Shaw           Date of Birth: Dec 22, 1965           MRN: [id number] Visit Date: 11/03/2016              Requested by: Jaclyn Shaggy, MD [address] [place], [place] [address] PCP: Jaclyn Shaggy, MD  Chief Complaint  Patient presents with  . Right Foot - Routine Post Op    10/04/16 right foot GT amputation at MTP joint.       HPI: Patient is status post left great toe amputation about 4 weeks out from surgery. Patient also underwent a gastrocnemius recession and has been working on heel cord stretching. Of note patient does have a total contact cast on the right foot that was placed at the wound center today.  Assessment & Plan: Visit Diagnoses:  1. Amputee, great toe, left (HCC)     Plan: Dial soap cleansing left foot apply a Band-Aid to the wound area use a moisturizing lotion daily continue with heel cord stretching daily advance to regular shoe wear  Follow-Up Instructions: Return in about 4 weeks (around 12/01/2016).   Ortho Exam  Patient is alert, oriented, no adenopathy, well-dressed, normal affect, normal respiratory effort. Examination patient has hypertrophic callus over the healed great toe amputation. After informed consent a 10 blade knife was used to pad the callus without complications. A Band-Aid was applied. Patient has dorsiflexion about 10 past neutral and he was given instructions to continue working on heel cord stretching the gastrocnemius recession incision is well-healed.  Imaging: No results found.  Labs: Lab Results  Component Value Date   HGBA1C 13.5 10/08/2016   HGBA1C 7.60 08/01/2015   HGBA1C 8.50 04/09/2015   ESRSEDRATE 41 (H) 10/02/2016   CRP <0.80 10/02/2016    Orders:  No orders of the defined types were placed in this encounter.  No orders of the defined types were placed in this encounter.    Procedures: No procedures performed  Clinical Data: No additional findings.  ROS:  All  other systems negative, except as noted in the HPI. Review of Systems  Objective: Vital Signs: Ht  (1.981 m)   Wt 225 lb (102.1 kg)   BMI 26.00 kg/m   Specialty Comments:  No specialty comments available.  PMFS History: Patient Active Problem List   Diagnosis Date Noted  . Amputee, great toe, left (HCC) 10/08/2016  . Diabetic foot ulcer (HCC) 10/08/2016  . Achilles tendon contracture, left   . 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 Acquired contracture of Achilles tendon, left   . Essential hypertension 10/02/2016  . CKD (chronic kidney disease), stage III 10/02/2016  . Bilateral chronic knee pain 07/09/2015  . Chronic osteomyelitis of left foot (HCC) 02/16/2015  . Type 2 diabetes mellitus with diabetic neuropathy, with long-term current use of insulin (HCC) 11/01/2014  . Diabetic peripheral neuropathy (HCC) 10/25/2014   Past Medical History:  Diagnosis Date  . Arthritis   . Diabetes mellitus without complication (HCC)   . Edema   . Hypercholesteremia   . Hypertension   . Neuropathy (HCC)     Family History  Problem Relation Age of Onset  . Cancer Mother   . Aneurysm Father     brain    Past Surgical History:  Procedure Laterality Date  . AMPUTATION Right 02/17/2015   Procedure: SECOND RAY AMPUTATION;  Surgeon: Kathryne Hitch,  MD;  Location: [facility] OR;  Service: Orthopedics;  Laterality: Right;  . AMPUTATION Left 10/04/2016   Procedure: GREAT TOE AMPUTATION GASTROCNEMIA RESECTION;  Surgeon: Nadara Mustard, MD;  Location: [facility] OR;  Service: Orthopedics;  Laterality: Left;   Social History   Occupational History  . Not on file.   Social History Main Topics  . Smoking status: Current Every Day Smoker    Packs/day: 0.25    Types: Cigarettes  . Smokeless tobacco: Never Used     Comment: 8 cigs daily  . Alcohol use No     Comment: patient states he quit drinking since he has been sick  . Drug use: Yes    Types: Marijuana     Comment: 2  weeks  . Sexual activity: Not on file

## 2016-11-03 NOTE — Telephone Encounter (Signed)
Writer called patient and discussed lab results.  Patient stated understanding. 

## 2016-11-05 DIAGNOSIS — E11621 Type 2 diabetes mellitus with foot ulcer: Secondary | ICD-10-CM | POA: Diagnosis not present

## 2016-11-09 ENCOUNTER — Emergency Department (HOSPITAL_COMMUNITY): Payer: Medicaid Other

## 2016-11-09 ENCOUNTER — Inpatient Hospital Stay (HOSPITAL_COMMUNITY)
Admission: EM | Admit: 2016-11-09 | Discharge: 2016-11-16 | DRG: 501 | Disposition: A | Discharge status: Home / Self Care | Payer: Medicaid Other | Attending: Family Medicine | Admitting: Family Medicine

## 2016-11-09 ENCOUNTER — Encounter (HOSPITAL_COMMUNITY): Payer: Self-pay

## 2016-11-09 DIAGNOSIS — Z79899 Other long term (current) drug therapy: Secondary | ICD-10-CM

## 2016-11-09 DIAGNOSIS — Z89412 Acquired absence of left great toe: Secondary | ICD-10-CM | POA: Diagnosis not present

## 2016-11-09 DIAGNOSIS — I129 Hypertensive chronic kidney disease with stage 1 through stage 4 chronic kidney disease, or unspecified chronic kidney disease: Secondary | ICD-10-CM | POA: Diagnosis present

## 2016-11-09 DIAGNOSIS — N183 Chronic kidney disease, stage 3 unspecified: Secondary | ICD-10-CM | POA: Diagnosis present

## 2016-11-09 DIAGNOSIS — M71051 Abscess of bursa, right hip: Secondary | ICD-10-CM

## 2016-11-09 DIAGNOSIS — M25571 Pain in right ankle and joints of right foot: Secondary | ICD-10-CM

## 2016-11-09 DIAGNOSIS — Z794 Long term (current) use of insulin: Secondary | ICD-10-CM

## 2016-11-09 DIAGNOSIS — L97529 Non-pressure chronic ulcer of other part of left foot with unspecified severity: Secondary | ICD-10-CM | POA: Diagnosis present

## 2016-11-09 DIAGNOSIS — E1142 Type 2 diabetes mellitus with diabetic polyneuropathy: Secondary | ICD-10-CM | POA: Diagnosis present

## 2016-11-09 DIAGNOSIS — A4902 Methicillin resistant Staphylococcus aureus infection, unspecified site: Secondary | ICD-10-CM

## 2016-11-09 DIAGNOSIS — L02415 Cutaneous abscess of right lower limb: Secondary | ICD-10-CM | POA: Diagnosis present

## 2016-11-09 DIAGNOSIS — M1A0720 Idiopathic chronic gout, left ankle and foot, without tophus (tophi): Principal | ICD-10-CM | POA: Diagnosis present

## 2016-11-09 DIAGNOSIS — B9562 Methicillin resistant Staphylococcus aureus infection as the cause of diseases classified elsewhere: Secondary | ICD-10-CM | POA: Diagnosis present

## 2016-11-09 DIAGNOSIS — E7800 Pure hypercholesterolemia, unspecified: Secondary | ICD-10-CM | POA: Diagnosis present

## 2016-11-09 DIAGNOSIS — E11621 Type 2 diabetes mellitus with foot ulcer: Secondary | ICD-10-CM | POA: Diagnosis present

## 2016-11-09 DIAGNOSIS — E1122 Type 2 diabetes mellitus with diabetic chronic kidney disease: Secondary | ICD-10-CM | POA: Diagnosis present

## 2016-11-09 DIAGNOSIS — Z881 Allergy status to other antibiotic agents status: Secondary | ICD-10-CM

## 2016-11-09 DIAGNOSIS — I10 Essential (primary) hypertension: Secondary | ICD-10-CM | POA: Diagnosis present

## 2016-11-09 DIAGNOSIS — F17210 Nicotine dependence, cigarettes, uncomplicated: Secondary | ICD-10-CM | POA: Diagnosis present

## 2016-11-09 DIAGNOSIS — L03116 Cellulitis of left lower limb: Secondary | ICD-10-CM

## 2016-11-09 DIAGNOSIS — M25551 Pain in right hip: Secondary | ICD-10-CM

## 2016-11-09 DIAGNOSIS — E785 Hyperlipidemia, unspecified: Secondary | ICD-10-CM | POA: Diagnosis present

## 2016-11-09 DIAGNOSIS — E1165 Type 2 diabetes mellitus with hyperglycemia: Secondary | ICD-10-CM | POA: Diagnosis present

## 2016-11-09 DIAGNOSIS — M79672 Pain in left foot: Secondary | ICD-10-CM | POA: Diagnosis present

## 2016-11-09 DIAGNOSIS — M869 Osteomyelitis, unspecified: Secondary | ICD-10-CM

## 2016-11-09 DIAGNOSIS — R509 Fever, unspecified: Secondary | ICD-10-CM

## 2016-11-09 DIAGNOSIS — E1100 Type 2 diabetes mellitus with hyperosmolarity without nonketotic hyperglycemic-hyperosmolar coma (NKHHC): Secondary | ICD-10-CM

## 2016-11-09 LAB — PROTIME-INR
INR: 1.07
PROTHROMBIN TIME: 13.9 s (ref 11.4–15.2)

## 2016-11-09 LAB — COMPREHENSIVE METABOLIC PANEL
ALK PHOS: 265 U/L — AB (ref 38–126)
ALT: 119 U/L — AB (ref 17–63)
AST: 78 U/L — ABNORMAL HIGH (ref 15–41)
Albumin: 3.7 g/dL (ref 3.5–5.0)
Anion gap: 11 (ref 5–15)
BILIRUBIN TOTAL: 0.7 mg/dL (ref 0.3–1.2)
BUN: 16 mg/dL (ref 6–20)
CALCIUM: 9 mg/dL (ref 8.9–10.3)
CO2: 23 mmol/L (ref 22–32)
CREATININE: 1.2 mg/dL (ref 0.61–1.24)
Chloride: 104 mmol/L (ref 101–111)
Glucose, Bld: 135 mg/dL — ABNORMAL HIGH (ref 65–99)
Potassium: 3.6 mmol/L (ref 3.5–5.1)
Sodium: 138 mmol/L (ref 135–145)
Total Protein: 6.9 g/dL (ref 6.5–8.1)

## 2016-11-09 LAB — CBC WITH DIFFERENTIAL/PLATELET
BASOS ABS: 0 10*3/uL (ref 0.0–0.1)
Basophils Relative: 0 %
EOS ABS: 0.1 10*3/uL (ref 0.0–0.7)
Eosinophils Relative: 1 %
HCT: 35.7 % — ABNORMAL LOW (ref 39.0–52.0)
Hemoglobin: 11.6 g/dL — ABNORMAL LOW (ref 13.0–17.0)
Lymphocytes Relative: 15 %
Lymphs Abs: 1.6 10*3/uL (ref 0.7–4.0)
MCH: 26.5 pg (ref 26.0–34.0)
MCHC: 32.5 g/dL (ref 30.0–36.0)
MCV: 81.7 fL (ref 78.0–100.0)
Monocytes Absolute: 0.8 10*3/uL (ref 0.1–1.0)
Monocytes Relative: 7 %
Neutro Abs: 8.2 10*3/uL — ABNORMAL HIGH (ref 1.7–7.7)
Neutrophils Relative %: 77 %
PLATELETS: 179 10*3/uL (ref 150–400)
RBC: 4.37 MIL/uL (ref 4.22–5.81)
RDW: 13.6 % (ref 11.5–15.5)
WBC: 10.7 10*3/uL — AB (ref 4.0–10.5)

## 2016-11-09 LAB — C-REACTIVE PROTEIN: CRP: 2.3 mg/dL — AB (ref <1.0)

## 2016-11-09 LAB — SEDIMENTATION RATE: Sed Rate: 30 mm/hr — ABNORMAL HIGH (ref 0–16)

## 2016-11-09 LAB — I-STAT CG4 LACTIC ACID, ED: LACTIC ACID, VENOUS: 1.11 mmol/L (ref 0.5–1.9)

## 2016-11-09 MED ORDER — CLINDAMYCIN PHOSPHATE 600 MG/50ML IV SOLN
600.0000 mg | Freq: Once | INTRAVENOUS | Status: AC
  Administered 2016-11-09: 600 mg via INTRAVENOUS
  Filled 2016-11-09: qty 50

## 2016-11-09 MED ORDER — VANCOMYCIN HCL 10 G IV SOLR
2000.0000 mg | Freq: Once | INTRAVENOUS | Status: DC
  Filled 2016-11-09: qty 2000

## 2016-11-09 MED ORDER — SODIUM CHLORIDE 0.9 % IV SOLN
INTRAVENOUS | Status: AC
  Administered 2016-11-10: 01:00:00 via INTRAVENOUS

## 2016-11-09 MED ORDER — PREGABALIN 100 MG PO CAPS
100.0000 mg | ORAL_CAPSULE | Freq: Two times a day (BID) | ORAL | Status: DC
  Administered 2016-11-10 – 2016-11-16 (×14): 100 mg via ORAL
  Filled 2016-11-09 (×14): qty 1

## 2016-11-09 MED ORDER — ACETAMINOPHEN 650 MG RE SUPP: 650.0000 mg | Freq: Four times a day (QID) | RECTAL | Status: DC | PRN

## 2016-11-09 MED ORDER — LISINOPRIL 5 MG PO TABS
5.0000 mg | ORAL_TABLET | Freq: Every day | ORAL | Status: DC
  Administered 2016-11-10 – 2016-11-15 (×7): 5 mg via ORAL
  Filled 2016-11-09 (×7): qty 1

## 2016-11-09 MED ORDER — INSULIN DETEMIR 100 UNIT/ML ~~LOC~~ SOLN
50.0000 [IU] | Freq: Every day | SUBCUTANEOUS | Status: DC
  Administered 2016-11-10 – 2016-11-15 (×7): 50 [IU] via SUBCUTANEOUS
  Filled 2016-11-09 (×8): qty 0.5

## 2016-11-09 MED ORDER — HYDROMORPHONE HCL 1 MG/ML IJ SOLN
1.0000 mg | Freq: Once | INTRAMUSCULAR | Status: AC
  Administered 2016-11-09: 1 mg via INTRAVENOUS
  Filled 2016-11-09: qty 1

## 2016-11-09 MED ORDER — PIPERACILLIN-TAZOBACTAM 3.375 G IVPB 30 MIN
3.3750 g | Freq: Once | INTRAVENOUS | Status: AC
  Administered 2016-11-09: 3.375 g via INTRAVENOUS
  Filled 2016-11-09: qty 50

## 2016-11-09 MED ORDER — ONDANSETRON HCL 4 MG/2ML IJ SOLN
4.0000 mg | Freq: Four times a day (QID) | INTRAMUSCULAR | Status: DC | PRN
  Filled 2016-11-09: qty 2

## 2016-11-09 MED ORDER — ACETAMINOPHEN 500 MG PO TABS
1000.0000 mg | ORAL_TABLET | Freq: Once | ORAL | Status: AC
  Administered 2016-11-09: 1000 mg via ORAL
  Filled 2016-11-09: qty 2

## 2016-11-09 MED ORDER — ONDANSETRON HCL 4 MG PO TABS
4.0000 mg | ORAL_TABLET | Freq: Four times a day (QID) | ORAL | Status: DC | PRN
Start: 2016-11-09 — End: 2016-11-12

## 2016-11-09 MED ORDER — SODIUM CHLORIDE 0.9 % IV BOLUS (SEPSIS)
1000.0000 mL | Freq: Once | INTRAVENOUS | Status: AC
  Administered 2016-11-09: 1000 mL via INTRAVENOUS

## 2016-11-09 MED ORDER — INSULIN ASPART 100 UNIT/ML ~~LOC~~ SOLN
0.0000 [IU] | Freq: Three times a day (TID) | SUBCUTANEOUS | Status: DC
  Administered 2016-11-10: 3 [IU] via SUBCUTANEOUS
  Administered 2016-11-11: 2 [IU] via SUBCUTANEOUS
  Administered 2016-11-13: 1 [IU] via SUBCUTANEOUS
  Administered 2016-11-13 – 2016-11-14 (×3): 2 [IU] via SUBCUTANEOUS
  Administered 2016-11-14: 3 [IU] via SUBCUTANEOUS
  Administered 2016-11-14: 2 [IU] via SUBCUTANEOUS
  Administered 2016-11-15: 7 [IU] via SUBCUTANEOUS
  Administered 2016-11-15 (×2): 3 [IU] via SUBCUTANEOUS
  Administered 2016-11-16 (×2): 2 [IU] via SUBCUTANEOUS

## 2016-11-09 MED ORDER — ATORVASTATIN CALCIUM 40 MG PO TABS
40.0000 mg | ORAL_TABLET | Freq: Every day | ORAL | Status: DC
  Administered 2016-11-10 – 2016-11-15 (×7): 40 mg via ORAL
  Filled 2016-11-09 (×7): qty 1

## 2016-11-09 MED ORDER — ACETAMINOPHEN 325 MG PO TABS
650.0000 mg | ORAL_TABLET | Freq: Four times a day (QID) | ORAL | Status: DC | PRN
  Administered 2016-11-10 – 2016-11-12 (×7): 650 mg via ORAL
  Filled 2016-11-09 (×7): qty 2

## 2016-11-09 MED ORDER — ONDANSETRON HCL 4 MG/2ML IJ SOLN
4.0000 mg | Freq: Once | INTRAMUSCULAR | Status: AC
  Administered 2016-11-09: 4 mg via INTRAVENOUS
  Filled 2016-11-09: qty 2

## 2016-11-09 MED ORDER — METOPROLOL TARTRATE 25 MG PO TABS
25.0000 mg | ORAL_TABLET | Freq: Two times a day (BID) | ORAL | Status: DC
  Administered 2016-11-10 – 2016-11-16 (×13): 25 mg via ORAL
  Filled 2016-11-09 (×14): qty 1

## 2016-11-09 MED ORDER — AMLODIPINE BESYLATE 10 MG PO TABS
10.0000 mg | ORAL_TABLET | Freq: Every day | ORAL | Status: DC
  Administered 2016-11-10 – 2016-11-15 (×7): 10 mg via ORAL
  Filled 2016-11-09 (×7): qty 1

## 2016-11-09 MED ORDER — INSULIN ASPART 100 UNIT/ML ~~LOC~~ SOLN: 30.0000 [IU] | Freq: Three times a day (TID) | SUBCUTANEOUS | Status: DC | PRN

## 2016-11-09 MED ORDER — LINEZOLID 600 MG/300ML IV SOLN
600.0000 mg | Freq: Once | INTRAVENOUS | Status: AC
  Administered 2016-11-09: 600 mg via INTRAVENOUS
  Filled 2016-11-09: qty 300

## 2016-11-09 NOTE — ED Provider Notes (Signed)
[facility]-EMERGENCY DEPT Provider Note   CSN: [id number] Arrival date & time: 11/09/16  1848     History   Chief Complaint Chief Complaint  Patient presents with  . Leg Pain    Left    HPI Ryan Shaw is a 51 y.o. male.  The history is provided by the patient.  Leg Pain   This is a recurrent problem. The current episode started 12 to 24 hours ago. The problem occurs constantly. The problem has been gradually worsening. The pain is present in the left foot. The quality of the pain is described as aching and sharp. The pain is at a severity of 8/10. The pain is moderate. Associated symptoms include limited range of motion and stiffness. The symptoms are aggravated by activity. He has tried rest for the symptoms. The treatment provided no relief. History of extremity trauma: Pt with recent amputation to toes on that foot.    Past Medical History:  Diagnosis Date  . Arthritis   . Diabetes mellitus without complication (HCC)   . Edema   . Hypercholesteremia   . Hypertension   . Neuropathy [facility])     Patient Active Problem List   Diagnosis Date Noted  . Osteomyelitis (HCC) 11/09/2016  . Osteomyelitis of left foot (HCC) 11/09/2016  . Amputee, great toe, left (HCC) 10/08/2016  . Diabetic foot ulcer (HCC) 10/08/2016  . Achilles tendon contracture, left   . 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 Acquired contracture of Achilles tendon, left   . Essential hypertension 10/02/2016  . CKD (chronic kidney disease), stage III 10/02/2016  . Bilateral chronic knee pain 07/09/2015  . Chronic osteomyelitis of left foot (HCC) 02/16/2015  . Type 2 diabetes mellitus with diabetic neuropathy, with long-term current use of insulin (HCC) 11/01/2014  . Diabetic peripheral neuropathy (HCC) 10/25/2014    Past Surgical History:  Procedure Laterality Date  . AMPUTATION Right 02/17/2015   Procedure: SECOND RAY AMPUTATION;  Surgeon: Kathryne Hitch, MD;  Location: [facility] OR;   Service: Orthopedics;  Laterality: Right;  . AMPUTATION Left 10/04/2016   Procedure: GREAT TOE AMPUTATION GASTROCNEMIA RESECTION;  Surgeon: Nadara Mustard, MD;  Location: [facility] OR;  Service: Orthopedics;  Laterality: Left;       Home Medications    Prior to Admission medications   Medication Sig Start Date End Date Taking? Authorizing Provider  acetaminophen (TYLENOL) 500 MG tablet Take 1,000 mg by mouth every 6 (six) hours as needed for headache (pain).   Yes Historical Provider, MD  amLODipine (NORVASC) 10 MG tablet Take 10 mg by mouth at bedtime.    Yes Historical Provider, MD  atorvastatin (LIPITOR) 40 MG tablet Take 40 mg by mouth at bedtime.  07/20/16  Yes Historical Provider, MD  insulin aspart (NOVOLOG FLEXPEN) 100 UNIT/ML FlexPen Inject 30 Units into the skin 3 (three) times daily as needed for high blood sugar (CBG >300).   Yes Historical Provider, MD  insulin detemir (LEVEMIR) 100 UNIT/ML injection Inject 0.5 mLs (50 Units total) into the skin at bedtime. 10/08/16  Yes Jaclyn Shaggy, MD  lisinopril (PRINIVIL,ZESTRIL) 5 MG tablet Take 1 tablet (5 mg total) by mouth daily. Patient taking differently: Take 5 mg by mouth at bedtime.  10/08/16  Yes Jaclyn Shaggy, MD  metoprolol tartrate (LOPRESSOR) 25 MG tablet Take 25 mg by mouth 2 (two) times daily.   Yes Historical Provider, MD  pregabalin (LYRICA) 100 MG capsule Take 1 capsule (100 mg total) by mouth 3 (  three) times daily. Patient taking differently: Take 100 mg by mouth 2 (two) times daily.  06/14/15  Yes Quentin Angst, MD  sildenafil (VIAGRA) 25 MG tablet Take 1 tablet (25 mg total) by mouth daily as needed for erectile dysfunction. Patient taking differently: Take 100 mg by mouth daily as needed for erectile dysfunction.  06/07/15  Yes Quentin Angst, MD  HYDROcodone-acetaminophen (NORCO/VICODIN) 5-325 MG tablet Take 1-2 tablets by mouth every 4 (four) hours as needed. Patient not taking: Reported on 11/09/2016 10/18/16   Alvira Monday, MD  ibuprofen (ADVIL,MOTRIN) 800 MG tablet Take 1 tablet (800 mg total) by mouth every 8 (eight) hours as needed for mild pain. Patient not taking: Reported on 11/09/2016 10/16/15   Kristen N Ward, DO  traMADol (ULTRAM) 50 MG tablet Take 1 tablet (50 mg total) by mouth every 8 (eight) hours as needed. Patient not taking: Reported on 11/09/2016 08/01/15   Ambrose Finland, NP    Family History Family History  Problem Relation Age of Onset  . Cancer Mother   . Aneurysm Father     brain    Social History Social History  Substance Use Topics  . Smoking status: Current Every Day Smoker    Packs/day: 0.25    Types: Cigarettes  . Smokeless tobacco: Never Used     Comment: 8 cigs daily  . Alcohol use No     Comment: patient states he quit drinking since he has been sick     Allergies   Other and Vancomycin   Review of Systems Review of Systems  Constitutional: Positive for activity change, chills and fever.  HENT: Negative for ear pain and sore throat.   Eyes: Negative for pain and visual disturbance.  Respiratory: Negative for cough and shortness of breath.   Cardiovascular: Negative for chest pain and palpitations.  Gastrointestinal: Negative for abdominal pain and vomiting.  Genitourinary: Negative for dysuria and hematuria.  Musculoskeletal: Positive for gait problem and stiffness. Negative for arthralgias and back pain.  Skin: Positive for wound. Negative for color change and rash.  Neurological: Negative for seizures and syncope.  All other systems reviewed and are negative.    Physical Exam Updated Vital Signs BP 130/73   Pulse 84   Temp 98.2 F (36.8 C) (Oral)   Resp 13   Ht  (1.981 m)   Wt 111.1 kg   SpO2 100%   BMI 28.31 kg/m   Physical Exam  Constitutional: He appears well-developed and well-nourished. He has a sickly appearance.  HENT:  Head: Normocephalic and atraumatic.  Eyes: Conjunctivae are normal.  Neck: Neck supple.    Cardiovascular: Normal rate and regular rhythm.   No murmur heard. Pulmonary/Chest: Effort normal and breath sounds normal. No respiratory distress.  Abdominal: Soft. There is no tenderness.  Musculoskeletal: He exhibits no edema.       Left foot: There is tenderness, bony tenderness, swelling and deformity.       Feet:  Neurological: He is alert.  Skin: Skin is warm and dry.  Psychiatric: He has a normal mood and affect.  Nursing note and vitals reviewed.    ED Treatments / Results  Labs (all labs ordered are listed, but only abnormal results are displayed) Labs Reviewed  COMPREHENSIVE METABOLIC PANEL - Abnormal; Notable for the following:       Result Value   Glucose, Bld 135 (*)    AST 78 (*)    ALT 119 (*)    Alkaline  Phosphatase 265 (*)    All other components within normal limits  CBC WITH DIFFERENTIAL/PLATELET - Abnormal; Notable for the following:    WBC 10.7 (*)    Hemoglobin 11.6 (*)    HCT 35.7 (*)    Neutro Abs 8.2 (*)    All other components within normal limits  SEDIMENTATION RATE - Abnormal; Notable for the following:    Sed Rate 30 (*)    All other components within normal limits  C-REACTIVE PROTEIN - Abnormal; Notable for the following:    CRP 2.3 (*)    All other components within normal limits  CULTURE, BLOOD (ROUTINE X 2)  CULTURE, BLOOD (ROUTINE X 2)  PROTIME-INR  URINALYSIS, ROUTINE W REFLEX MICROSCOPIC  BASIC METABOLIC PANEL  CBC  I-STAT CG4 LACTIC ACID, ED    EKG  EKG Interpretation None       Radiology Dg Chest 2 View  Result Date: 11/09/2016 CLINICAL DATA:  Left leg pain EXAM: CHEST  2 VIEW COMPARISON:  10/18/2016 FINDINGS: The heart size and mediastinal contours are within normal limits. Both lungs are clear. The visualized skeletal structures are unremarkable. IMPRESSION: No active cardiopulmonary disease. Electronically Signed   By: Signa Kell M.D.   On: 11/09/2016 20:28   Dg Foot Complete Left  Result Date:  11/09/2016 CLINICAL DATA:  Left leg pain starting 30 minutes ago. History of left big toe amputation 1 month ago. Pain and tenderness. EXAM: LEFT FOOT - COMPLETE 3+ VIEW COMPARISON:  10/30/2016 FINDINGS: Indication of the great toe at the first MTP articulation. Subchondral lucency involving the head of the first metatarsal is new without cortical bone destruction. Findings could potentially represent subtle changes of AVN or early changes of osteomyelitis. No periosteal new bone formation is identified. No acute displaced appearing fracture is seen. Mild superficial debris is noted at the stump of the great toe. Small plantar and dorsal calcaneal enthesophytes are noted. IMPRESSION: 1. Status post amputation of the left great toe at the first MTP articulation. 2. Subchondral lucencies without collapse or cortical bone destruction is seen of the first metatarsal head which is new since prior exam. Early changes of avascular necrosis or osteomyelitis are not entirely excluded. 3. No acute fracture or malalignment.  Calcaneal enthesophytes. Electronically Signed   By: Tollie Eth M.D.   On: 11/09/2016 20:37    Procedures Procedures (including critical care time)  Medications Ordered in ED Medications  insulin aspart (novoLOG) injection 30 Units (not administered)  atorvastatin (LIPITOR) tablet 40 mg (not administered)  insulin detemir (LEVEMIR) injection 50 Units (not administered)  lisinopril (PRINIVIL,ZESTRIL) tablet 5 mg (not administered)  amLODipine (NORVASC) tablet 10 mg (not administered)  metoprolol tartrate (LOPRESSOR) tablet 25 mg (not administered)  pregabalin (LYRICA) capsule 100 mg (not administered)  acetaminophen (TYLENOL) tablet 650 mg (not administered)    Or  acetaminophen (TYLENOL) suppository 650 mg (not administered)  ondansetron (ZOFRAN) tablet 4 mg (not administered)    Or  ondansetron (ZOFRAN) injection 4 mg (not administered)  insulin aspart (novoLOG) injection 0-9 Units  (not administered)  0.9 %  sodium chloride infusion (not administered)  sodium chloride 0.9 % bolus 1,000 mL (0 mLs Intravenous Stopped 11/09/16 2115)  acetaminophen (TYLENOL) tablet 1,000 mg (1,000 mg Oral Given 11/09/16 1950)  HYDROmorphone (DILAUDID) injection 1 mg (1 mg Intravenous Given 11/09/16 1952)  ondansetron (ZOFRAN) injection 4 mg (4 mg Intravenous Given 11/09/16 1954)  clindamycin (CLEOCIN) IVPB 600 mg (0 mg Intravenous Stopped 11/09/16 2027)  piperacillin-tazobactam (ZOSYN) IVPB  3.375 g (0 g Intravenous Stopped 11/09/16 2236)  linezolid (ZYVOX) IVPB 600 mg (600 mg Intravenous New Bag/Given 11/09/16 2243)     Initial Impression / Assessment and Plan / ED Course  I have reviewed the triage vital signs and the nursing notes.  Pertinent labs & imaging results that were available during my care of the patient were reviewed by me and considered in my medical decision making (see chart for details).     51 year old male with history of diabetes and chronic wound to right foot as well as recent amputation to the left toes who presents in the setting of fever. Patient reports the pain in his legs and worsened today and he started having chills and for the skin to the emergency department.  On arrival found to be hemodynamic stable and noted to have fever. Wound site without purulence or drainage but it is warm and tender to touch. X-ray concerning for possible osteomyelitis. Because sepsis was initiated and patient given IV fluids as well as broad-spectrum antibiotics. No signs of necrotizing soft tissue infection at this time. Chronic wound on right foot was evaluated without signs of obvious infection at this time. Patient denies any other symptoms and chest x-ray without significant abnormalities.  Inflammatory markers elevated. Laboratory analysis revealed no other significant abnormalities and blood cultures were obtained. Patient will be admitted to medicine for further management of this  condition was stable at time of transfer of care.  Final Clinical Impressions(s) / ED Diagnoses   Final diagnoses:  Cellulitis of left lower extremity  Fever, unspecified fever cause    New Prescriptions New Prescriptions   No medications on file     Stacy Gardner, MD 11/10/16 1610    Gerhard Munch, MD 11/10/16 2357

## 2016-11-09 NOTE — ED Notes (Signed)
Patient transported to X-ray 

## 2016-11-09 NOTE — H&P (Signed)
History and Physical    Ryan Shaw ZOX:[id number] DOB: 15-Dec-1965 DOA: 11/09/2016  PCP: Jaclyn Shaggy, MD  Patient coming from: Home.  Chief Complaint: Left foot amputation site pain.  HPI: Ryan Shaw is a 52 y.o. male with history of diabetes mellitus type 2, hypertension, hyperlipidemia presents to the ER with increasing pain over the amputation site of the left great toe. Patient had amputation about 6 weeks ago for infected toe. Patient had recently followed up with Dr. Lajoyce Corners last week. Last 2 days patient has been experiencing increasing pain with subjective feeling of fever and chills.   ED Course: In the ER x-rays of the left foot shows collapse of the subchondral bone of the left foot amputation site. Concerning for osteomyelitis or avascular necrosis. Patient's pain radiates all the way up to the leg. Patient also is mildly febrile with temperatures running around 100.4. Mild leukocytosis. Patient is being admitted for further management of possible osteomyelitis. 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  Review of Systems: As per HPI, rest all negative.   Past Medical History:  Diagnosis Date  . Arthritis   . Diabetes mellitus without complication (HCC)   . Edema   . Hypercholesteremia   . Hypertension   . Neuropathy [facility])     Past Surgical History:  Procedure Laterality Date  . AMPUTATION Right 02/17/2015   Procedure: SECOND RAY AMPUTATION;  Surgeon: Kathryne Hitch, MD;  Location: [facility] OR;  Service: Orthopedics;  Laterality: Right;  . AMPUTATION Left 10/04/2016   Procedure: GREAT TOE AMPUTATION GASTROCNEMIA RESECTION;  Surgeon: Nadara Mustard, MD;  Location: [facility] OR;  Service: Orthopedics;  Laterality: Left;     reports that he has been smoking Cigarettes.  He has been smoking about 0.25 packs per day. He has never used smokeless tobacco. He reports that he does not drink alcohol or use  drugs.  Allergies  Allergen Reactions  . Other Nausea And Vomiting    NeuRemedy - Dietary Supplement  . Vancomycin Other (See Comments)    Given after surgery in January 2018 at [facility] - shut kidneys down per pt    Family History  Problem Relation Age of Onset  . Cancer Mother   . Aneurysm Father     brain    Prior to Admission medications   Medication Sig Start Date End Date Taking? Authorizing Provider  acetaminophen (TYLENOL) 500 MG tablet Take 1,000 mg by mouth every 6 (six) hours as needed for headache (pain).   Yes Historical Provider, MD  amLODipine (NORVASC) 10 MG tablet Take 10 mg by mouth at bedtime.    Yes Historical Provider, MD  atorvastatin (LIPITOR) 40 MG tablet Take 40 mg by mouth at bedtime.  07/20/16  Yes Historical Provider, MD  insulin aspart (NOVOLOG FLEXPEN) 100 UNIT/ML FlexPen Inject 30 Units into the skin 3 (three) times daily as needed for high blood sugar (CBG >300).   Yes Historical Provider, MD  insulin detemir (LEVEMIR) 100 UNIT/ML injection Inject 0.5 mLs (50 Units total) into the skin at bedtime. 10/08/16  Yes Jaclyn Shaggy, MD  lisinopril (PRINIVIL,ZESTRIL) 5 MG tablet Take 1 tablet (5 mg total) by mouth daily. Patient taking differently: Take 5 mg by mouth at bedtime.  10/08/16  Yes Jaclyn Shaggy, MD  metoprolol tartrate (LOPRESSOR) 25 MG tablet Take 25 mg by mouth 2 (two) times daily.   Yes Historical Provider, MD  pregabalin (LYRICA)  100 MG capsule Take 1 capsule (100 mg total) by mouth 3 (three) times daily. Patient taking differently: Take 100 mg by mouth 2 (two) times daily.  06/14/15  Yes Quentin Angst, MD  sildenafil (VIAGRA) 25 MG tablet Take 1 tablet (25 mg total) by mouth daily as needed for erectile dysfunction. Patient taking differently: Take 100 mg by mouth daily as needed for erectile dysfunction.  06/07/15  Yes Quentin Angst, MD  HYDROcodone-acetaminophen (NORCO/VICODIN) 5-325 MG tablet Take 1-2 tablets  by mouth every 4 (four) hours as needed. Patient not taking: Reported on 11/09/2016 10/18/16   Alvira Monday, MD  ibuprofen (ADVIL,MOTRIN) 800 MG tablet Take 1 tablet (800 mg total) by mouth every 8 (eight) hours as needed for mild pain. Patient not taking: Reported on 11/09/2016 10/16/15   Kristen N Ward, DO  traMADol (ULTRAM) 50 MG tablet Take 1 tablet (50 mg total) by mouth every 8 (eight) hours as needed. Patient not taking: Reported on 11/09/2016 08/01/15   Ambrose Finland, NP    Physical Exam: Vitals:   11/09/16 2130 11/09/16 2145 11/09/16 2237 11/09/16 2300  BP: (!) 112/57 114/74 126/65 130/73  Pulse: 97 94 88 84  Resp:   18 13  Temp:   98.2 F (36.8 C)   TempSrc:   Oral   SpO2: 100% 100% 100% 100%  Weight:      Height:          Constitutional: Moderately built and nourished. Vitals:   11/09/16 2130 11/09/16 2145 11/09/16 2237 11/09/16 2300  BP: (!) 112/57 114/74 126/65 130/73  Pulse: 97 94 88 84  Resp:   18 13  Temp:   98.2 F (36.8 C)   TempSrc:   Oral   SpO2: 100% 100% 100% 100%  Weight:      Height:       Eyes: Anicteric no pallor. ENMT: No discharge from the ears eyes nose and mouth. Neck: No mass felt. No neck rigidity. Respiratory: No rhonchi or crepitations. Cardiovascular: S1 and S2 heard no murmurs appreciated. Abdomen: Soft nontender bowel sounds present. Musculoskeletal: No acute ischemic changes on the left foot. No active discharge. Skin: No active discharge from the foot area. Neurologic: Alert awake oriented to time place and person. Moves all extremities. Psychiatric: Appears normal. Normal affect.   Labs on Admission: I have personally reviewed following labs and imaging studies  CBC:  Recent Labs Lab 11/09/16 1912  WBC 10.7*  NEUTROABS 8.2*  HGB 11.6*  HCT 35.7*  MCV 81.7  PLT 179   Basic Metabolic Panel:  Recent Labs Lab 11/09/16 1912  NA 138  K 3.6  CL 104  CO2 23  GLUCOSE 135*  BUN 16  CREATININE 1.20  CALCIUM 9.0    GFR: Estimated Creatinine Clearance: 103.4 mL/min (by C-G formula based on SCr of 1.2 mg/dL). Liver Function Tests:  Recent Labs Lab 11/09/16 1912  AST 78*  ALT 119*  ALKPHOS 265*  BILITOT 0.7  PROT 6.9  ALBUMIN 3.7   No results for input(s): LIPASE, AMYLASE in the last 168 hours. No results for input(s): AMMONIA in the last 168 hours. Coagulation Profile:  Recent Labs Lab 11/09/16 1912  INR 1.07   Cardiac Enzymes: No results for input(s): CKTOTAL, CKMB, CKMBINDEX, TROPONINI in the last 168 hours. BNP (last 3 results) No results for input(s): PROBNP in the last 8760 hours. HbA1C: No results for input(s): HGBA1C in the last 72 hours. CBG: No results for input(s): GLUCAP in the  last 168 hours. Lipid Profile: No results for input(s): CHOL, HDL, LDLCALC, TRIG, CHOLHDL, LDLDIRECT in the last 72 hours. Thyroid Function Tests: No results for input(s): TSH, T4TOTAL, FREET4, T3FREE, THYROIDAB in the last 72 hours. Anemia Panel: No results for input(s): VITAMINB12, FOLATE, FERRITIN, TIBC, IRON, RETICCTPCT in the last 72 hours. Urine analysis:    Component Value Date/Time   COLORURINE STRAW (A) 10/18/2016 1428   APPEARANCEUR CLEAR 10/18/2016 1428   LABSPEC 1.018 10/18/2016 1428   PHURINE 6.0 10/18/2016 1428   GLUCOSEU >=500 (A) 10/18/2016 1428   HGBUR NEGATIVE 10/18/2016 1428   BILIRUBINUR NEGATIVE 10/18/2016 1428   BILIRUBINUR neg 10/24/2014 1152   KETONESUR NEGATIVE 10/18/2016 1428   PROTEINUR NEGATIVE 10/18/2016 1428   UROBILINOGEN 0.2 03/30/2015 2059   NITRITE NEGATIVE 10/18/2016 1428   LEUKOCYTESUR NEGATIVE 10/18/2016 1428   Sepsis Labs: (procalcitonin:4,lacticidven:4) ) Recent Results (from the past 240 hour(s))  Culture, blood (Routine x 2)     Status: None (Preliminary result)   Collection Time: 11/09/16  7:19 PM  Result Value Ref Range Status   Specimen Description BLOOD RIGHT ANTECUBITAL  Final   Special Requests   Final    BOTTLES DRAWN  AEROBIC AND ANAEROBIC Blood Culture adequate volume   Culture PENDING  Incomplete   Report Status PENDING  Incomplete     Radiological Exams on Admission: Dg Chest 2 View  Result Date: 11/09/2016 CLINICAL DATA:  Left leg pain EXAM: CHEST  2 VIEW COMPARISON:  10/18/2016 FINDINGS: The heart size and mediastinal contours are within normal limits. Both lungs are clear. The visualized skeletal structures are unremarkable. IMPRESSION: No active cardiopulmonary disease. Electronically Signed   By: Signa Kell M.D.   On: 11/09/2016 20:28   Dg Foot Complete Left  Result Date: 11/09/2016 CLINICAL DATA:  Left leg pain starting 30 minutes ago. History of left big toe amputation 1 month ago. Pain and tenderness. EXAM: LEFT FOOT - COMPLETE 3+ VIEW COMPARISON:  10/30/2016 FINDINGS: Indication of the great toe at the first MTP articulation. Subchondral lucency involving the head of the first metatarsal is new without cortical bone destruction. Findings could potentially represent subtle changes of AVN or early changes of osteomyelitis. No periosteal new bone formation is identified. No acute displaced appearing fracture is seen. Mild superficial debris is noted at the stump of the great toe. Small plantar and dorsal calcaneal enthesophytes are noted. IMPRESSION: 1. Status post amputation of the left great toe at the first MTP articulation. 2. Subchondral lucencies without collapse or cortical bone destruction is seen of the first metatarsal head which is new since prior exam. Early changes of avascular necrosis or osteomyelitis are not entirely excluded. 3. No acute fracture or malalignment.  Calcaneal enthesophytes. Electronically Signed   By: Tollie Eth M.D.   On: 11/09/2016 20:37     Assessment/Plan Principal Problem:   Osteomyelitis of left foot (HCC) Active Problems:   Type 2 diabetes mellitus with diabetic neuropathy, with long-term current use of insulin (HCC)   Essential hypertension   CKD (chronic  kidney disease), stage III   Osteomyelitis (HCC)    1. Left foot pain most likely from osteomyelitis/avascular necrosis - check sedimentation rate. Follow blood cultures. I have ordered MRI of the left foot. Patient has received 1 dose of Zyvox in the ER for further doses please consult infectious disease in a.m. Note that patient has had previous episode of renal failure going to dialysis after patient receiving vancomycin/Zosyn. I have also ordered ABI. Patient has  had a recent left foot great toe amputation last 6 weeks ago. Follows up with Dr. Lajoyce Corners. 2. Diabetes mellitus type 2 - on Levemir. Patient will be also placed on sliding scale coverage. 3. Hypertension on lisinopril beta blocker and Norvasc. 4. Hyperlipidemia on statins. 5. Tobacco abuse - advised to quit smoking.   DVT prophylaxis: SCDs in anticipation of possible procedure. Code Status: Full code.  Family Communication: Discussed with patient.  Disposition Plan: Home.  Consults called: None.  Admission status: Inpatient.    Eduard Clos MD [facility] Pager [phone].  If 7PM-7AM, please contact night-coverage [email or web address] Password [facility]  11/09/2016, 11:42 PM

## 2016-11-09 NOTE — Progress Notes (Signed)
Orthopedic Tech Progress Note Patient Details:  Ryan Shaw [birth year] [id number]  Casting Type of Cast: Short leg cast Cast Location: RLE Cast Material: Fiberglass Cast Intervention: Removal     Jennye Moccasin 11/09/2016, 9:10 PM

## 2016-11-09 NOTE — ED Notes (Signed)
Pt returned from xray and connected to the monitor 

## 2016-11-09 NOTE — ED Notes (Signed)
Ortho at bedside removing cast at this time

## 2016-11-09 NOTE — ED Triage Notes (Signed)
Pt from home with ems c.o left leg pain that started about 30 mins ago. Pt has history of left big oe amputation about a month ago and amputation of second toe on right foot. Right foot wrapped with a boot on. Pt sts she wound care nurse on Wednesday to remove dressing on right foot. Pt febrile at 100.8 orally. Bp 146/74 HR 105 +97% on room air. NAD at this time

## 2016-11-10 ENCOUNTER — Inpatient Hospital Stay (HOSPITAL_COMMUNITY): Payer: Medicaid Other

## 2016-11-10 ENCOUNTER — Ambulatory Visit: Payer: Self-pay | Admitting: Family Medicine

## 2016-11-10 DIAGNOSIS — M79609 Pain in unspecified limb: Secondary | ICD-10-CM

## 2016-11-10 DIAGNOSIS — L03116 Cellulitis of left lower limb: Secondary | ICD-10-CM

## 2016-11-10 DIAGNOSIS — Z794 Long term (current) use of insulin: Secondary | ICD-10-CM

## 2016-11-10 DIAGNOSIS — M1A0720 Idiopathic chronic gout, left ankle and foot, without tophus (tophi): Secondary | ICD-10-CM

## 2016-11-10 DIAGNOSIS — E1140 Type 2 diabetes mellitus with diabetic neuropathy, unspecified: Secondary | ICD-10-CM

## 2016-11-10 DIAGNOSIS — N183 Chronic kidney disease, stage 3 (moderate): Secondary | ICD-10-CM

## 2016-11-10 LAB — BASIC METABOLIC PANEL
Anion gap: 9 (ref 5–15)
BUN: 13 mg/dL (ref 6–20)
CHLORIDE: 106 mmol/L (ref 101–111)
CO2: 24 mmol/L (ref 22–32)
CREATININE: 1.17 mg/dL (ref 0.61–1.24)
Calcium: 8.5 mg/dL — ABNORMAL LOW (ref 8.9–10.3)
GFR calc Af Amer: >60 mL/min (ref >60)
Glucose, Bld: 155 mg/dL — ABNORMAL HIGH (ref 65–99)
Potassium: 3.4 mmol/L — ABNORMAL LOW (ref 3.5–5.1)
SODIUM: 139 mmol/L (ref 135–145)

## 2016-11-10 LAB — CBC
HCT: 35.2 % — ABNORMAL LOW (ref 39.0–52.0)
Hemoglobin: 11.3 g/dL — ABNORMAL LOW (ref 13.0–17.0)
MCH: 26.4 pg (ref 26.0–34.0)
MCHC: 32.1 g/dL (ref 30.0–36.0)
MCV: 82.2 fL (ref 78.0–100.0)
Platelets: 166 10*3/uL (ref 150–400)
RBC: 4.28 MIL/uL (ref 4.22–5.81)
RDW: 13.9 % (ref 11.5–15.5)
WBC: 9.3 10*3/uL (ref 4.0–10.5)

## 2016-11-10 LAB — GLUCOSE, CAPILLARY
GLUCOSE-CAPILLARY: 88 mg/dL (ref 65–99)
GLUCOSE-CAPILLARY: 113 mg/dL — AB (ref 65–99)
GLUCOSE-CAPILLARY: 216 mg/dL — AB (ref 65–99)
GLUCOSE-CAPILLARY: 232 mg/dL — AB (ref 65–99)
Glucose-Capillary: 98 mg/dL (ref 65–99)
Glucose-Capillary: 228 mg/dL — ABNORMAL HIGH (ref 65–99)

## 2016-11-10 LAB — URIC ACID: Uric Acid, Serum: 5.1 mg/dL (ref 4.4–7.6)

## 2016-11-10 LAB — URINALYSIS, ROUTINE W REFLEX MICROSCOPIC
BILIRUBIN URINE: NEGATIVE
Glucose, UA: NEGATIVE mg/dL
HGB URINE DIPSTICK: NEGATIVE
KETONES UR: NEGATIVE mg/dL
Leukocytes, UA: NEGATIVE
Nitrite: NEGATIVE
PH: 5 (ref 5.0–8.0)
PROTEIN: NEGATIVE mg/dL
SPECIFIC GRAVITY, URINE: 1.018 (ref 1.005–1.030)

## 2016-11-10 MED ORDER — MORPHINE SULFATE (PF) 2 MG/ML IV SOLN
1.0000 mg | Freq: Once | INTRAVENOUS | Status: AC
  Administered 2016-11-10: 1 mg via INTRAVENOUS
  Filled 2016-11-10: qty 1

## 2016-11-10 MED ORDER — POLYETHYLENE GLYCOL 3350 17 G PO PACK: 17.0000 g | PACK | Freq: Every day | ORAL | Status: DC | PRN

## 2016-11-10 MED ORDER — OXYCODONE HCL 5 MG PO TABS
5.0000 mg | ORAL_TABLET | ORAL | Status: DC | PRN
  Administered 2016-11-10 – 2016-11-12 (×11): 5 mg via ORAL
  Filled 2016-11-10 (×11): qty 1

## 2016-11-10 MED ORDER — PIPERACILLIN-TAZOBACTAM 3.375 G IVPB 30 MIN
3.3750 g | Freq: Once | INTRAVENOUS | Status: DC
  Filled 2016-11-10: qty 50

## 2016-11-10 MED ORDER — PIPERACILLIN-TAZOBACTAM 3.375 G IVPB
3.3750 g | Freq: Three times a day (TID) | INTRAVENOUS | Status: DC
  Filled 2016-11-10: qty 50

## 2016-11-10 NOTE — Progress Notes (Signed)
Patient complaining pain right hip.Patient stated," Pain started earlier today but now getting worse." This nurse looked at right hip earlier in shift around 2030 and saw edema , warm and tender to touch. Now swelling around right hip area increasing in size and marked with marker.Text paged Maren Reamer NP.New orders received for xray of right hip.

## 2016-11-10 NOTE — Consult Note (Signed)
ORTHOPAEDIC CONSULTATION  REQUESTING PHYSICIAN: Renae Fickle, MD  Chief Complaint: Patient presents complaining of swelling and pain around his ankle pain across the toes status post a great toe amputation left foot  HPI: Ryan Shaw is a 51 y.o. male who presents with pain and swelling around the left ankle with pain over the tips of the left toes patient has diabetic insensate neuropathy status post great toe amputation of the MTP joint he has had a radiograph and MRI scan. Patient states that his brother has bad gout.  Past Medical History:  Diagnosis Date  . Arthritis   . Diabetes mellitus without complication (HCC)   . Edema   . Hypercholesteremia   . Hypertension   . Neuropathy [facility])    Past Surgical History:  Procedure Laterality Date  . AMPUTATION Right 02/17/2015   Procedure: SECOND RAY AMPUTATION;  Surgeon: Kathryne Hitch, MD;  Location: [facility] OR;  Service: Orthopedics;  Laterality: Right;  . AMPUTATION Left 10/04/2016   Procedure: GREAT TOE AMPUTATION GASTROCNEMIA RESECTION;  Surgeon: Nadara Mustard, MD;  Location: [facility] OR;  Service: Orthopedics;  Laterality: Left;   Social History   Social History  . Marital status: Legally Separated    Spouse name: N/A  . Number of children: N/A  . Years of education: N/A   Social History Main Topics  . Smoking status: Current Every Day Smoker    Packs/day: 0.25    Types: Cigarettes  . Smokeless tobacco: Never Used     Comment: 8 cigs daily  . Alcohol use No     Comment: patient states he quit drinking since he has been sick  . Drug use: No     Comment: 2 weeks  . Sexual activity: Not Asked   Other Topics Concern  . None   Social History Narrative  . None   Family History  Problem Relation Age of Onset  . Cancer Mother   . Aneurysm Father     brain   - negative except otherwise stated in the family history section Allergies  Allergen Reactions  . Other Nausea And Vomiting    NeuRemedy - Dietary  Supplement  . Vancomycin Other (See Comments)    Given after surgery in January 2018 at [facility] - shut kidneys down per pt   Prior to Admission medications   Medication Sig Start Date End Date Taking? Authorizing Provider  acetaminophen (TYLENOL) 500 MG tablet Take 1,000 mg by mouth every 6 (six) hours as needed for headache (pain).   Yes Historical Provider, MD  amLODipine (NORVASC) 10 MG tablet Take 10 mg by mouth at bedtime.    Yes Historical Provider, MD  atorvastatin (LIPITOR) 40 MG tablet Take 40 mg by mouth at bedtime.  07/20/16  Yes Historical Provider, MD  insulin aspart (NOVOLOG FLEXPEN) 100 UNIT/ML FlexPen Inject 30 Units into the skin 3 (three) times daily as needed for high blood sugar (CBG >300).   Yes Historical Provider, MD  insulin detemir (LEVEMIR) 100 UNIT/ML injection Inject 0.5 mLs (50 Units total) into the skin at bedtime. 10/08/16  Yes Jaclyn Shaggy, MD  lisinopril (PRINIVIL,ZESTRIL) 5 MG tablet Take 1 tablet (5 mg total) by mouth daily. Patient taking differently: Take 5 mg by mouth at bedtime.  10/08/16  Yes Jaclyn Shaggy, MD  metoprolol tartrate (LOPRESSOR) 25 MG tablet Take 25 mg by mouth 2 (two) times daily.   Yes Historical Provider, MD  pregabalin (LYRICA) 100 MG capsule Take 1  capsule (100 mg total) by mouth 3 (three) times daily. Patient taking differently: Take 100 mg by mouth 2 (two) times daily.  06/14/15  Yes Quentin Angst, MD  sildenafil (VIAGRA) 25 MG tablet Take 1 tablet (25 mg total) by mouth daily as needed for erectile dysfunction. Patient taking differently: Take 100 mg by mouth daily as needed for erectile dysfunction.  06/07/15  Yes Quentin Angst, MD  HYDROcodone-acetaminophen (NORCO/VICODIN) 5-325 MG tablet Take 1-2 tablets by mouth every 4 (four) hours as needed. Patient not taking: Reported on 11/09/2016 10/18/16   Alvira Monday, MD  ibuprofen (ADVIL,MOTRIN) 800 MG tablet Take 1 tablet (800 mg total) by mouth every 8  (eight) hours as needed for mild pain. Patient not taking: Reported on 11/09/2016 10/16/15   Kristen N Ward, DO  traMADol (ULTRAM) 50 MG tablet Take 1 tablet (50 mg total) by mouth every 8 (eight) hours as needed. Patient not taking: Reported on 11/09/2016 08/01/15   Ambrose Finland, NP   Dg Chest 2 View  Result Date: 11/09/2016 CLINICAL DATA:  Left leg pain EXAM: CHEST  2 VIEW COMPARISON:  10/18/2016 FINDINGS: The heart size and mediastinal contours are within normal limits. Both lungs are clear. The visualized skeletal structures are unremarkable. IMPRESSION: No active cardiopulmonary disease. Electronically Signed   By: Signa Kell M.D.   On: 11/09/2016 20:28   Mr Foot Left Wo Contrast  Result Date: 11/10/2016 CLINICAL DATA:  Increasing pain at the site of amputation of the left great toe. EXAM: MRI OF THE LEFT FOOT WITHOUT CONTRAST TECHNIQUE: Multiplanar, multisequence MR imaging of the left forefoot was performed. No intravenous contrast was administered. COMPARISON:  radiographs dated 11/09/2016 and 10/03/2016 and MRI dated 02/06/2015 FINDINGS: Bones/Joint/Cartilage There is a thin rim of edema deep to the articular cortex of the distal first metatarsal which correlates with subcortical lucencies seen on radiographs. There is no cortical destruction or extensive edema in the metatarsal head. There is a small amount of fluid surrounding the first metatarsal head. The other bones of the forefoot appear normal. IMPRESSION: Findings are suggestive of early osteomyelitis of the head of the first metatarsal. Electronically Signed   By: Francene Boyers M.D.   On: 11/10/2016 09:23   Dg Foot Complete Left  Result Date: 11/09/2016 CLINICAL DATA:  Left leg pain starting 30 minutes ago. History of left big toe amputation 1 month ago. Pain and tenderness. EXAM: LEFT FOOT - COMPLETE 3+ VIEW COMPARISON:  10/30/2016 FINDINGS: Indication of the great toe at the first MTP articulation. Subchondral lucency involving the  head of the first metatarsal is new without cortical bone destruction. Findings could potentially represent subtle changes of AVN or early changes of osteomyelitis. No periosteal new bone formation is identified. No acute displaced appearing fracture is seen. Mild superficial debris is noted at the stump of the great toe. Small plantar and dorsal calcaneal enthesophytes are noted. IMPRESSION: 1. Status post amputation of the left great toe at the first MTP articulation. 2. Subchondral lucencies without collapse or cortical bone destruction is seen of the first metatarsal head which is new since prior exam. Early changes of avascular necrosis or osteomyelitis are not entirely excluded. 3. No acute fracture or malalignment.  Calcaneal enthesophytes. Electronically Signed   By: Tollie Eth M.D.   On: 11/09/2016 20:37   - pertinent xrays, CT, MRI studies were reviewed and independently interpreted  Positive ROS: All other systems have been reviewed and were otherwise negative with the exception of  those mentioned in the HPI and as above.  Physical Exam: General: Alert, no acute distress Psychiatric: Patient is competent for consent with normal mood and affect Lymphatic: No axillary or cervical lymphadenopathy Cardiovascular: No pedal edema Respiratory: No cyanosis, no use of accessory musculature GI: No organomegaly, abdomen is soft and non-tender  Skin: On examination the left foot there is no cellulitis he has no open ulcers left foot, ankle.   Neurologic: Patient does not have protective sensation bilateral lower extremities.   MUSCULOSKELETAL:  On examination patient has swelling around the left ankle this is painful to palpation. Patient has no swelling or open ulcers over the first metatarsal head he has tenderness to palpation over the tips of the toes but there is no swelling of the toes. He does have a palpable dorsalis pedis pulse. Review of the radiographs shows some mild changes to the  cortex of the first metatarsal head left foot and the MRI scan does show some mild edema in the cortex of the first metatarsal head but no edema within the shaft of the bone no evidence of a draining sinus tract no evidence of an abscess.  Assessment: Assessment: Possible gout left ankle. Clinically patient does not have osteomyelitis of the first metatarsal head left foot  Plan: Plan: Uric acid level tonight. If this is elevated patient would benefit from allopurinol and colchicine. I will reevaluate tomorrow.  Thank you for the consult and the opportunity to see Mr. Lionell Matuszak, MD [facility] [phone] 5:37 PM

## 2016-11-10 NOTE — Progress Notes (Signed)
PROGRESS NOTE  Ryan Shaw  NWG:[id number] DOB: 1966/06/25 DOA: 11/09/2016 PCP: Jaclyn Shaggy, MD  Brief Narrative:   Ryan Shaw is a 51 y.o. male with history of diabetes mellitus type 2, hypertension, hyperlipidemia presents to the ER with increasing pain over the amputation site of the left great toe. Patient had amputation about 6 weeks ago for infected toe. Patient had recently followed up with Dr. Lajoyce Corners last week at which time he was feeling well.  Since that appointment, he has had increasing redness, swelling and pain in the left foot, pain in tips of toes and also near ankles.  Last 2 days he developed fever and chills.  X-rays of the left foot showed collapse of the subchondral bone of the left foot amputation site. MRI also concerning for early distal 1st metatarsal osteomyelitis.  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  ABIs were normal and he has good pedal pulse.  Dr. Lajoyce Corners has been notified for biopsy vs. Amputation.  Holding further antibiotics until patient can be seen.    Assessment & Plan:   Principal Problem:   Osteomyelitis of left foot (HCC) Active Problems:   Type 2 diabetes mellitus with diabetic neuropathy, with long-term current use of insulin (HCC)   Essential hypertension   CKD (chronic kidney disease), stage III   Osteomyelitis (HCC)  Left foot pain from possible early 1st metatarsal osteomyelitis per MRI -  Blood cultures pending -  ID consult  -  Dr. Lajoyce Corners consulted for possible biopsy/debridement -  ABIs normal -  Recent HIV NR -  D/c antibiotics   Diabetes mellitus type 2, uncontrolled, CBG well controlled since admission -  A1c 13.5 on 10/08/2016  Hypertension, blood pressures stable -  Continue lisinopril and norvasc  Hyperlipidemia, stable -  Continue statin  Tobacco abuse, advised to quit smoking  Mildly elevated LFTs, suspect fatty liver and dehydration.   Bili is normal -  IVF -  Repeat LFTs in AM -  Check acute hepatitis panel  DVT prophylaxis:  SCDs Code Status:  Full  Family Communication:  Patient alone Disposition Plan:  Pending treatment for possible osteomyelitis and cellulitis of the left foot   Consultants:   Dr. Lajoyce Corners  Infectious disease  Procedures:   ABIs MRI left foot  Antimicrobials:  Anti-infectives    Start     Dose/Rate Route Frequency Ordered Stop   11/09/16 2245  linezolid (ZYVOX) IVPB 600 mg     600 mg 300 mL/hr over 60 Minutes Intravenous  Once 11/09/16 2230 11/09/16 2343   11/09/16 2100  piperacillin-tazobactam (ZOSYN) IVPB 3.375 g     3.375 g 100 mL/hr over 30 Minutes Intravenous  Once 11/09/16 2045 11/09/16 2236   11/09/16 2045  vancomycin (VANCOCIN) 2,000 mg in sodium chloride 0.9 % 500 mL IVPB  Status:  Discontinued     2,000 mg 250 mL/hr over 120 Minutes Intravenous  Once 11/09/16 2045 11/09/16 2229   11/09/16 1930  clindamycin (CLEOCIN) IVPB 600 mg     600 mg 100 mL/hr over 30 Minutes Intravenous  Once 11/09/16 1923 11/09/16 2027       Subjective:  Left foot is feeling much better/less painful and swollen since antibiotics were given.  Denies cough, SOB, chest pains.  Had subjective fevers and chills before coming to ER.    Objective: Vitals:   11/10/16 0002 11/10/16 0014 11/10/16 0620 11/10/16 0916  BP: Marland Kitchen)  111/56 113/67 110/60 116/67  Pulse: 83 77 80 89  Resp: Temp: 97.7 F (36.5 C)  97.9 F (36.6 C) 98.9 F (37.2 C)  TempSrc: Oral  Oral Oral  SpO2: 100%  100% 100%  Weight: 112.4 kg (247 lb 11.2 oz)     Height:  (1.93 m)       Intake/Output Summary (Last 24 hours) at 11/10/16 1525 Last data filed at 11/10/16 1500  Gross per 24 hour  Intake          2558.75 ml  Output             1675 ml  Net           883.75 ml   Filed Weights   11/09/16 1855 11/10/16 0002  Weight: 111.1 kg (245 lb) 112.4 kg (247 lb 11.2 oz)    Examination:  General exam:  Adult  male.  No acute distress.  HEENT:  NCAT, MMM Respiratory system: Clear to auscultation bilaterally Cardiovascular system: Regular rate and rhythm, normal S1/S2. No murmurs, rubs, gallops or clicks.  Warm extremities Gastrointestinal system: Normal active bowel sounds, soft, nondistended, nontender. MSK:  Normal tone and bulk, left foot with 1st toe amputation.  Eschar overlies previous surgical scar area.  Nontender to palpation, no overlying erythema.  Skin is somewhat indurated and surrounding tissues are swollen.  Whole foot has 1+ pitting edema and there is a 5mm ulcer on the dorsum of the left foot with surrounding erythema.   Neuro:  Grossly intact    Data Reviewed: I have personally reviewed following labs and imaging studies  CBC:  Recent Labs Lab 11/09/16 1912 11/10/16 0558  WBC 10.7* 9.3  NEUTROABS 8.2*  --   HGB 11.6* 11.3*  HCT 35.7* 35.2*  MCV 81.7 82.2  PLT 179 166   Basic Metabolic Panel:  Recent Labs Lab 11/09/16 1912 11/10/16 0558  NA 138 139  K 3.6 3.4*  CL 104 106  CO2 23 24  GLUCOSE 135* 155*  BUN 16 13  CREATININE 1.20 1.17  CALCIUM 9.0 8.5*   GFR: Estimated Creatinine Clearance: 103.6 mL/min (by C-G formula based on SCr of 1.17 mg/dL). Liver Function Tests:  Recent Labs Lab 11/09/16 1912  AST 78*  ALT 119*  ALKPHOS 265*  BILITOT 0.7  PROT 6.9  ALBUMIN 3.7   No results for input(s): LIPASE, AMYLASE in the last 168 hours. No results for input(s): AMMONIA in the last 168 hours. Coagulation Profile:  Recent Labs Lab 11/09/16 1912  INR 1.07   Cardiac Enzymes: No results for input(s): CKTOTAL, CKMB, CKMBINDEX, TROPONINI in the last 168 hours. BNP (last 3 results) No results for input(s): PROBNP in the last 8760 hours. HbA1C: No results for input(s): HGBA1C in the last 72 hours. CBG:  Recent Labs Lab 11/09/16 2346 11/10/16 0903 11/10/16 1230  GLUCAP 98 88 113*   Lipid Profile: No results for input(s): CHOL, HDL, LDLCALC,  TRIG, CHOLHDL, LDLDIRECT in the last 72 hours. Thyroid Function Tests: No results for input(s): TSH, T4TOTAL, FREET4, T3FREE, THYROIDAB in the last 72 hours. Anemia Panel: No results for input(s): VITAMINB12, FOLATE, FERRITIN, TIBC, IRON, RETICCTPCT in the last 72 hours. Urine analysis:    Component Value Date/Time   COLORURINE YELLOW 11/10/2016 0127   APPEARANCEUR CLEAR 11/10/2016 0127   LABSPEC 1.018 11/10/2016 0127   PHURINE 5.0 11/10/2016 0127   GLUCOSEU NEGATIVE 11/10/2016 0127   HGBUR NEGATIVE 11/10/2016 0127   BILIRUBINUR NEGATIVE  11/10/2016 0127   BILIRUBINUR neg 10/24/2014 1152   KETONESUR NEGATIVE 11/10/2016 0127   PROTEINUR NEGATIVE 11/10/2016 0127   UROBILINOGEN 0.2 03/30/2015 2059   NITRITE NEGATIVE 11/10/2016 0127   LEUKOCYTESUR NEGATIVE 11/10/2016 0127   Sepsis Labs: (procalcitonin:4,lacticidven:4)  ) Recent Results (from the past 240 hour(s))  Culture, blood (Routine x 2)     Status: None (Preliminary result)   Collection Time: 11/09/16  7:19 PM  Result Value Ref Range Status   Specimen Description BLOOD RIGHT ANTECUBITAL  Final   Special Requests   Final    BOTTLES DRAWN AEROBIC AND ANAEROBIC Blood Culture adequate volume   Culture NO GROWTH < 24 HOURS  Final   Report Status PENDING  Incomplete  Culture, blood (Routine x 2)     Status: None (Preliminary result)   Collection Time: 11/09/16  7:19 PM  Result Value Ref Range Status   Specimen Description BLOOD LEFT ANTECUBITAL  Final   Special Requests   Final    BOTTLES DRAWN AEROBIC ONLY Blood Culture adequate volume   Culture NO GROWTH < 24 HOURS  Final   Report Status PENDING  Incomplete      Radiology Studies: Dg Chest 2 View  Result Date: 11/09/2016 CLINICAL DATA:  Left leg pain EXAM: CHEST  2 VIEW COMPARISON:  10/18/2016 FINDINGS: The heart size and mediastinal contours are within normal limits. Both lungs are clear. The visualized skeletal structures are unremarkable. IMPRESSION: No  active cardiopulmonary disease. Electronically Signed   By: Signa Kell M.D.   On: 11/09/2016 20:28   Mr Foot Left Wo Contrast  Result Date: 11/10/2016 CLINICAL DATA:  Increasing pain at the site of amputation of the left great toe. EXAM: MRI OF THE LEFT FOOT WITHOUT CONTRAST TECHNIQUE: Multiplanar, multisequence MR imaging of the left forefoot was performed. No intravenous contrast was administered. COMPARISON:  radiographs dated 11/09/2016 and 10/03/2016 and MRI dated 02/06/2015 FINDINGS: Bones/Joint/Cartilage There is a thin rim of edema deep to the articular cortex of the distal first metatarsal which correlates with subcortical lucencies seen on radiographs. There is no cortical destruction or extensive edema in the metatarsal head. There is a small amount of fluid surrounding the first metatarsal head. The other bones of the forefoot appear normal. IMPRESSION: Findings are suggestive of early osteomyelitis of the head of the first metatarsal. Electronically Signed   By: Francene Boyers M.D.   On: 11/10/2016 09:23   Dg Foot Complete Left  Result Date: 11/09/2016 CLINICAL DATA:  Left leg pain starting 30 minutes ago. History of left big toe amputation 1 month ago. Pain and tenderness. EXAM: LEFT FOOT - COMPLETE 3+ VIEW COMPARISON:  10/30/2016 FINDINGS: Indication of the great toe at the first MTP articulation. Subchondral lucency involving the head of the first metatarsal is new without cortical bone destruction. Findings could potentially represent subtle changes of AVN or early changes of osteomyelitis. No periosteal new bone formation is identified. No acute displaced appearing fracture is seen. Mild superficial debris is noted at the stump of the great toe. Small plantar and dorsal calcaneal enthesophytes are noted. IMPRESSION: 1. Status post amputation of the left great toe at the first MTP articulation. 2. Subchondral lucencies without collapse or cortical bone destruction is seen of the first  metatarsal head which is new since prior exam. Early changes of avascular necrosis or osteomyelitis are not entirely excluded. 3. No acute fracture or malalignment.  Calcaneal enthesophytes. Electronically Signed   By: Tollie Eth M.D.   On:  11/09/2016 20:37     Scheduled Meds: . amLODipine  10 mg Oral QHS  . atorvastatin  40 mg Oral QHS  . insulin aspart  0-9 Units Subcutaneous TID WC  . insulin detemir  50 Units Subcutaneous QHS  . lisinopril  5 mg Oral QHS  . metoprolol tartrate  25 mg Oral BID  . pregabalin  100 mg Oral BID   Continuous Infusions: . sodium chloride 75 mL/hr at 11/10/16 0101     LOS: 1 day    Time spent: 30 min    Renae Fickle, MD [facility] Pager [phone]  If 7PM-7AM, please contact night-coverage [email or web address] Password [email or web address] 11/10/2016, 3:25 PM

## 2016-11-10 NOTE — Progress Notes (Signed)
VASCULAR LAB PRELIMINARY  ARTERIAL  ABI completed: Bilateral ABI within normal limits.     RIGHT    LEFT    PRESSURE WAVEFORM  PRESSURE WAVEFORM  BRACHIAL 139 Triphasic BRACHIAL 121 Triphasic  DP 172 Triphasic DP 157 Triphasic  PT 165 Triphasic PT 151 Triphasic  GREAT TOE  NA GREAT TOE  NA    RIGHT LEFT  ABI 1.2 1.1     Syd Manges D, RVT 11/10/2016, 9:01 AM

## 2016-11-11 ENCOUNTER — Other Ambulatory Visit (INDEPENDENT_AMBULATORY_CARE_PROVIDER_SITE_OTHER): Payer: Self-pay | Admitting: Family

## 2016-11-11 ENCOUNTER — Inpatient Hospital Stay (HOSPITAL_COMMUNITY): Payer: Medicaid Other

## 2016-11-11 DIAGNOSIS — R509 Fever, unspecified: Secondary | ICD-10-CM

## 2016-11-11 DIAGNOSIS — F17210 Nicotine dependence, cigarettes, uncomplicated: Secondary | ICD-10-CM

## 2016-11-11 DIAGNOSIS — Z809 Family history of malignant neoplasm, unspecified: Secondary | ICD-10-CM

## 2016-11-11 DIAGNOSIS — B9689 Other specified bacterial agents as the cause of diseases classified elsewhere: Secondary | ICD-10-CM

## 2016-11-11 DIAGNOSIS — Z881 Allergy status to other antibiotic agents status: Secondary | ICD-10-CM

## 2016-11-11 DIAGNOSIS — M25551 Pain in right hip: Secondary | ICD-10-CM

## 2016-11-11 DIAGNOSIS — L03116 Cellulitis of left lower limb: Secondary | ICD-10-CM

## 2016-11-11 DIAGNOSIS — Z8249 Family history of ischemic heart disease and other diseases of the circulatory system: Secondary | ICD-10-CM

## 2016-11-11 DIAGNOSIS — Z889 Allergy status to unspecified drugs, medicaments and biological substances status: Secondary | ICD-10-CM

## 2016-11-11 DIAGNOSIS — Z89412 Acquired absence of left great toe: Secondary | ICD-10-CM

## 2016-11-11 DIAGNOSIS — M109 Gout, unspecified: Secondary | ICD-10-CM

## 2016-11-11 DIAGNOSIS — M25571 Pain in right ankle and joints of right foot: Secondary | ICD-10-CM

## 2016-11-11 LAB — COMPREHENSIVE METABOLIC PANEL
ALBUMIN: 2.8 g/dL — AB (ref 3.5–5.0)
ALK PHOS: 209 U/L — AB (ref 38–126)
ALT: 66 U/L — ABNORMAL HIGH (ref 17–63)
ANION GAP: 7 (ref 5–15)
AST: 29 U/L (ref 15–41)
BILIRUBIN TOTAL: 0.6 mg/dL (ref 0.3–1.2)
BUN: 9 mg/dL (ref 6–20)
CO2: 24 mmol/L (ref 22–32)
Calcium: 8.5 mg/dL — ABNORMAL LOW (ref 8.9–10.3)
Chloride: 109 mmol/L (ref 101–111)
Creatinine, Ser: 0.99 mg/dL (ref 0.61–1.24)
GFR calc Af Amer: >60 mL/min (ref >60)
GFR calc non Af Amer: >60 mL/min (ref >60)
GLUCOSE: 115 mg/dL — AB (ref 65–99)
Potassium: 3.6 mmol/L (ref 3.5–5.1)
Sodium: 140 mmol/L (ref 135–145)
TOTAL PROTEIN: 5.9 g/dL — AB (ref 6.5–8.1)

## 2016-11-11 LAB — GLUCOSE, CAPILLARY
GLUCOSE-CAPILLARY: 152 mg/dL — AB (ref 65–99)
Glucose-Capillary: 92 mg/dL (ref 65–99)
Glucose-Capillary: 112 mg/dL — ABNORMAL HIGH (ref 65–99)
Glucose-Capillary: 199 mg/dL — ABNORMAL HIGH (ref 65–99)

## 2016-11-11 LAB — SURGICAL PCR SCREEN
MRSA, PCR: POSITIVE — AB
Staphylococcus aureus: POSITIVE — AB

## 2016-11-11 LAB — CBC
HCT: 31.8 % — ABNORMAL LOW (ref 39.0–52.0)
Hemoglobin: 10.1 g/dL — ABNORMAL LOW (ref 13.0–17.0)
MCH: 26.3 pg (ref 26.0–34.0)
MCHC: 31.8 g/dL (ref 30.0–36.0)
MCV: 82.8 fL (ref 78.0–100.0)
PLATELETS: 152 10*3/uL (ref 150–400)
RBC: 3.84 MIL/uL — ABNORMAL LOW (ref 4.22–5.81)
RDW: 14 % (ref 11.5–15.5)
WBC: 7.7 10*3/uL (ref 4.0–10.5)

## 2016-11-11 MED ORDER — CHLORHEXIDINE GLUCONATE 4 % EX LIQD
60.0000 mL | Freq: Once | CUTANEOUS | Status: AC
  Filled 2016-11-11: qty 60

## 2016-11-11 MED ORDER — DOXYCYCLINE HYCLATE 100 MG PO TABS
100.0000 mg | ORAL_TABLET | Freq: Two times a day (BID) | ORAL | Status: DC
  Administered 2016-11-11: 100 mg via ORAL
  Filled 2016-11-11: qty 1

## 2016-11-11 MED ORDER — CEFAZOLIN SODIUM-DEXTROSE 2-4 GM/100ML-% IV SOLN
2.0000 g | INTRAVENOUS | Status: DC
  Filled 2016-11-11: qty 100

## 2016-11-11 NOTE — Progress Notes (Addendum)
Patient ID: Ryan Shaw, male   DOB: 03/26/1966, 50 y.o.   MRN: [id number] Patient states that last night he developed a new swelling area over the lateral aspect of the right hip. Patient is not on any blood thinners. Patient states it's painful to try to lay on the right hip. Patient has no fever or chills. Examination there is hard indurated area over the greater trochanter of the right hip this is tender to palpation. After informed consent this was sterilely prepped and aspirated with an 18-gauge needle a small amount of bloody purulent material was aspirated. This was sent for cultures. We'll plan for surgery tomorrow after 4 PM for surgical incision and debridement of the abscess. 

## 2016-11-11 NOTE — Progress Notes (Addendum)
PROGRESS NOTE  Ryan Shaw  WUJ:[id number] DOB: [birth year] DOA: 11/09/2016 PCP: Jaclyn Shaggy, MD  Brief Narrative:   Ryan Shaw is a 51 y.o. male with history of diabetes mellitus type 2, hypertension, hyperlipidemia presents to the ER with increasing pain over the amputation site of the left great toe. Patient had amputation about 6 weeks ago for infected toe. Patient had recently followed up with Dr. Lajoyce Corners last week at which time he was feeling well.  Since that appointment, he has had increasing redness, swelling and pain in the left foot, pain in tips of toes and also near ankles.  Last 2 days he developed fever and chills.  X-rays of the left foot showed collapse of the subchondral bone of the left foot amputation site. MRI also concerning for early distal 1st metatarsal osteomyelitis.  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  ABIs were normal and he has good pedal pulse.  Dr. Lajoyce Corners has been notified for biopsy vs. Amputation.  Holding further antibiotics until patient can be seen.    Assessment & Plan:   Principal Problem:   Osteomyelitis of left foot (HCC) Active Problems:   Type 2 diabetes mellitus with diabetic neuropathy, with long-term current use of insulin (HCC)   Essential hypertension   CKD (chronic kidney disease), stage III   Osteomyelitis (HCC)   Idiopathic chronic gout of left ankle without tophus  Left foot pain likely from gout per orthopedics -  Blood cultures NGTD -  Dr. Lajoyce Corners consulted -  ABIs normal -  Recent HIV NR  Diabetes mellitus type 2, uncontrolled, CBG well controlled since admission -  A1c 13.5 on 10/08/2016 CBG (last 3)   Recent Labs  11/10/16 2051 11/11/16 0750 11/11/16 1135  GLUCAP 228* 92 112*   Hypertension, blood pressures stable -  Continue lisinopril and norvasc  Abscess right hip - I called and asked orthopedist Dr. Lajoyce Corners if he would come and take a  look at it for possible I&D.  Doxycycline oral tabs ordered.   Hyperlipidemia, stable -  Continue statin  Tobacco abuse, advised to quit smoking  Mildly elevated LFTs, suspect fatty liver and dehydration.  Bili is normal -  acute hepatitis panel pending  DVT prophylaxis:  SCDs Code Status:  Full  Family Communication:  Patient alone Disposition Plan:  Pending treatment for possible osteomyelitis and cellulitis of the left foot  Consultants:   Dr. Lajoyce Corners  Infectious disease  Procedures:   ABIs MRI left foot  Antimicrobials:  Anti-infectives    Start     Dose/Rate Route Frequency Ordered Stop   11/10/16 2200  piperacillin-tazobactam (ZOSYN) IVPB 3.375 g  Status:  Discontinued     3.375 g 12.5 mL/hr over 240 Minutes Intravenous Every 8 hours 11/10/16 1529 11/10/16 1539   11/10/16 1530  piperacillin-tazobactam (ZOSYN) IVPB 3.375 g  Status:  Discontinued     3.375 g 100 mL/hr over 30 Minutes Intravenous  Once 11/10/16 1528 11/10/16 1539   11/09/16 2245  linezolid (ZYVOX) IVPB 600 mg     600 mg 300 mL/hr over 60 Minutes Intravenous  Once 11/09/16 2230 11/09/16 2343   11/09/16 2100  piperacillin-tazobactam (ZOSYN) IVPB 3.375 g     3.375 g 100 mL/hr over 30 Minutes Intravenous  Once 11/09/16 2045 11/09/16 2236   11/09/16 2045  vancomycin (VANCOCIN) 2,000 mg in sodium chloride 0.9 % 500 mL IVPB  Status:  Discontinued  2,000 mg 250 mL/hr over 120 Minutes Intravenous  Once 11/09/16 2045 11/09/16 2229   11/09/16 1930  clindamycin (CLEOCIN) IVPB 600 mg     600 mg 100 mL/hr over 30 Minutes Intravenous  Once 11/09/16 1923 11/09/16 2027       Subjective:  Pt has abscess in right hip buttock area that is tender and hot.     Objective: Vitals:   11/10/16 1722 11/10/16 2055 11/11/16 0534 11/11/16 0946  BP: 114/71 109/73 (!) 100/57 (!) 104/58  Pulse: 84 88 67 70  Resp: Temp: 99.7 F (37.6 C) 99.8 F (37.7 C) 98.4 F (36.9 C) 98.1 F (36.7 C)  TempSrc: Oral  Oral Oral Oral  SpO2: 100% 98% 99% 100%  Weight:  115.3 kg (254 lb 3.2 oz)    Height:        Intake/Output Summary (Last 24 hours) at 11/11/16 1305 Last data filed at 11/11/16 0900  Gross per 24 hour  Intake          1888.75 ml  Output              750 ml  Net          1138.75 ml   Filed Weights   11/09/16 1855 11/10/16 0002 11/10/16 2055  Weight: 111.1 kg (245 lb) 112.4 kg (247 lb 11.2 oz) 115.3 kg (254 lb 3.2 oz)    Examination:  General exam:  Adult male.  No acute distress.  HEENT:  NCAT, MMM Respiratory system: Clear to auscultation bilaterally Cardiovascular system: Regular rate and rhythm, normal S1/S2. No murmurs, rubs, gallops or clicks.  Warm extremities Gastrointestinal system: Normal active bowel sounds, soft, nondistended, nontender. MSK:  Normal tone and bulk, left foot with 1st toe amputation.  Abscess right hip fluctuant and hot to touch.  Eschar overlies previous surgical scar area.  Nontender to palpation, no overlying erythema.  Skin is somewhat indurated and surrounding tissues are swollen.  Whole foot has 1+ pitting edema and there is a 5mm ulcer on the dorsum of the left foot with surrounding erythema.   Neuro:  Grossly intact.   Data Reviewed: I have personally reviewed following labs and imaging studies  CBC:  Recent Labs Lab 11/09/16 1912 11/10/16 0558 11/11/16 0428  WBC 10.7* 9.3 7.7  NEUTROABS 8.2*  --   --   HGB 11.6* 11.3* 10.1*  HCT 35.7* 35.2* 31.8*  MCV 81.7 82.2 82.8  PLT 179 166 152   Basic Metabolic Panel:  Recent Labs Lab 11/09/16 1912 11/10/16 0558 11/11/16 0428  NA 138 139 140  K 3.6 3.4* 3.6  CL 104 106 109  CO2 GLUCOSE 135* 155* 115*  BUN CREATININE 1.20 1.17 0.99  CALCIUM 9.0 8.5* 8.5*   GFR: Estimated Creatinine Clearance: 124 mL/min (by C-G formula based on SCr of 0.99 mg/dL). Liver Function Tests:  Recent Labs Lab 11/09/16 1912 11/11/16 0428  AST 78* 29  ALT 119* 66*  ALKPHOS 265* 209*   BILITOT 0.7 0.6  PROT 6.9 5.9*  ALBUMIN 3.7 2.8*   No results for input(s): LIPASE, AMYLASE in the last 168 hours. No results for input(s): AMMONIA in the last 168 hours. Coagulation Profile:  Recent Labs Lab 11/09/16 1912  INR 1.07   Cardiac Enzymes: No results for input(s): CKTOTAL, CKMB, CKMBINDEX, TROPONINI in the last 168 hours. BNP (last 3 results) No results for input(s): PROBNP in the last 8760 hours. HbA1C:  No results for input(s): HGBA1C in the last 72 hours. CBG:  Recent Labs Lab 11/10/16 1638 11/10/16 1709 11/10/16 2051 11/11/16 0750 11/11/16 1135  GLUCAP 232* 216* 228* 92 112*   Lipid Profile: No results for input(s): CHOL, HDL, LDLCALC, TRIG, CHOLHDL, LDLDIRECT in the last 72 hours. Thyroid Function Tests: No results for input(s): TSH, T4TOTAL, FREET4, T3FREE, THYROIDAB in the last 72 hours. Anemia Panel: No results for input(s): VITAMINB12, FOLATE, FERRITIN, TIBC, IRON, RETICCTPCT in the last 72 hours. Urine analysis:    Component Value Date/Time   COLORURINE YELLOW 11/10/2016 0127   APPEARANCEUR CLEAR 11/10/2016 0127   LABSPEC 1.018 11/10/2016 0127   PHURINE 5.0 11/10/2016 0127   GLUCOSEU NEGATIVE 11/10/2016 0127   HGBUR NEGATIVE 11/10/2016 0127   BILIRUBINUR NEGATIVE 11/10/2016 0127   BILIRUBINUR neg 10/24/2014 1152   KETONESUR NEGATIVE 11/10/2016 0127   PROTEINUR NEGATIVE 11/10/2016 0127   UROBILINOGEN 0.2 03/30/2015 2059   NITRITE NEGATIVE 11/10/2016 0127   LEUKOCYTESUR NEGATIVE 11/10/2016 0127    Recent Results (from the past 240 hour(s))  Culture, blood (Routine x 2)     Status: None (Preliminary result)   Collection Time: 11/09/16  7:19 PM  Result Value Ref Range Status   Specimen Description BLOOD RIGHT ANTECUBITAL  Final   Special Requests   Final    BOTTLES DRAWN AEROBIC AND ANAEROBIC Blood Culture adequate volume   Culture NO GROWTH 2 DAYS  Final   Report Status PENDING  Incomplete  Culture, blood (Routine x 2)     Status:  None (Preliminary result)   Collection Time: 11/09/16  7:19 PM  Result Value Ref Range Status   Specimen Description BLOOD LEFT ANTECUBITAL  Final   Special Requests   Final    BOTTLES DRAWN AEROBIC ONLY Blood Culture adequate volume   Culture NO GROWTH 2 DAYS  Final   Report Status PENDING  Incomplete    Radiology Studies: Dg Chest 2 View  Result Date: 11/09/2016 CLINICAL DATA:  Left leg pain EXAM: CHEST  2 VIEW COMPARISON:  10/18/2016 FINDINGS: The heart size and mediastinal contours are within normal limits. Both lungs are clear. The visualized skeletal structures are unremarkable. IMPRESSION: No active cardiopulmonary disease. Electronically Signed   By: Signa Kell M.D.   On: 11/09/2016 20:28   Mr Foot Left Wo Contrast  Result Date: 11/10/2016 CLINICAL DATA:  Increasing pain at the site of amputation of the left great toe. EXAM: MRI OF THE LEFT FOOT WITHOUT CONTRAST TECHNIQUE: Multiplanar, multisequence MR imaging of the left forefoot was performed. No intravenous contrast was administered. COMPARISON:  radiographs dated 11/09/2016 and 10/03/2016 and MRI dated 02/06/2015 FINDINGS: Bones/Joint/Cartilage There is a thin rim of edema deep to the articular cortex of the distal first metatarsal which correlates with subcortical lucencies seen on radiographs. There is no cortical destruction or extensive edema in the metatarsal head. There is a small amount of fluid surrounding the first metatarsal head. The other bones of the forefoot appear normal. IMPRESSION: Findings are suggestive of early osteomyelitis of the head of the first metatarsal. Electronically Signed   By: Francene Boyers M.D.   On: 11/10/2016 09:23   Dg Foot Complete Left  Result Date: 11/09/2016 CLINICAL DATA:  Left leg pain starting 30 minutes ago. History of left big toe amputation 1 month ago. Pain and tenderness. EXAM: LEFT FOOT - COMPLETE 3+ VIEW COMPARISON:  10/30/2016 FINDINGS: Indication of the great toe at the first MTP  articulation. Subchondral lucency involving the head of  the first metatarsal is new without cortical bone destruction. Findings could potentially represent subtle changes of AVN or early changes of osteomyelitis. No periosteal new bone formation is identified. No acute displaced appearing fracture is seen. Mild superficial debris is noted at the stump of the great toe. Small plantar and dorsal calcaneal enthesophytes are noted. IMPRESSION: 1. Status post amputation of the left great toe at the first MTP articulation. 2. Subchondral lucencies without collapse or cortical bone destruction is seen of the first metatarsal head which is new since prior exam. Early changes of avascular necrosis or osteomyelitis are not entirely excluded. 3. No acute fracture or malalignment.  Calcaneal enthesophytes. Electronically Signed   By: Tollie Eth M.D.   On: 11/09/2016 20:37   Dg Hip Unilat With Pelvis 2-3 Views Right  Result Date: 11/11/2016 CLINICAL DATA:  Acute onset of right lateral hip pain and swelling. Initial encounter. EXAM: DG HIP (WITH OR WITHOUT PELVIS) 2-3V RIGHT COMPARISON:  None. FINDINGS: There is no evidence of fracture or dislocation. Both femoral heads are seated normally within their respective acetabula. The proximal right femur appears intact. No significant degenerative change is appreciated. The sacroiliac joints are unremarkable in appearance. The visualized bowel gas pattern is grossly unremarkable in appearance. IMPRESSION: No evidence of fracture or dislocation. Electronically Signed   By: Roanna Raider M.D.   On: 11/11/2016 00:40   Scheduled Meds: . amLODipine  10 mg Oral QHS  . atorvastatin  40 mg Oral QHS  . insulin aspart  0-9 Units Subcutaneous TID WC  . insulin detemir  50 Units Subcutaneous QHS  . lisinopril  5 mg Oral QHS  . metoprolol tartrate  25 mg Oral BID  . pregabalin  100 mg Oral BID   Continuous Infusions:   LOS: 2 days   Time spent: 33 min  Standley Dakins,  MD [facility] Pager [phone]  If 7PM-7AM, please contact night-coverage [email or web address] Password [email or web address] 11/11/2016, 1:05 PM

## 2016-11-11 NOTE — Consult Note (Signed)
Date of Admission:  11/09/2016  Date of Consult:  11/11/2016  Reason for Consult: Osteomyelitis in toe Referring Physician: Dr. Wynetta Emery   HPI: Ryan Shaw is an 51 y.o. male with past medical history significant for type II days mellitus with neuropathy hyperlipidemia who had had amputation of part of the left great toe 6 weeks ago. He's been followed by Dr. Sharol Given but then 2 weeks prior to admission began experiencing increasing pain in his ankle along with subjective fever and chills. Was performed which showed thin rim of edema deep to the articular cortex of the distal first metatarsal suggestive or early osteomyelitis. Dr. Sharol Given saw the patient yesterday and does not feel that the patient has osteomyelitis in the toe. Rather he feels the patient might have a gout flare of the left ankle where the patient is experiencing pain along with some slight pain in other digits on his left toe. He also has some discomfort on his left shin where he had some surgery done as well.     Past Medical History:  Diagnosis Date  . Arthritis   . Diabetes mellitus without complication ([place])   . Edema   . Hypercholesteremia   . Hypertension   . Neuropathy [facility])     Past Surgical History:  Procedure Laterality Date  . AMPUTATION Right 02/17/2015   Procedure: SECOND RAY AMPUTATION;  Surgeon: [name], MD;  Location: [place];  Service: Orthopedics;  Laterality: Right;  . AMPUTATION Left 10/04/2016   Procedure: GREAT TOE AMPUTATION GASTROCNEMIA RESECTION;  Surgeon: Newt Minion, MD;  Location: [place];  Service: Orthopedics;  Laterality: Left;    Social History:  reports that he has been smoking Cigarettes.  He has been smoking about 0.25 packs per day. He has never used smokeless tobacco. He reports that he does not drink alcohol or use drugs.   Family History  Problem Relation Age of Onset  . Cancer Mother   . Aneurysm Father     brain    Allergies  Allergen Reactions  .  Other Nausea And Vomiting    NeuRemedy - Dietary Supplement  . Vancomycin Other (See Comments)    Given after surgery in January 2018 at [facility] - shut kidneys down per pt     Medications: I have reviewed patients current medications as documented in Epic Anti-infectives    Start     Dose/Rate Route Frequency Ordered Stop   11/11/16 1315  doxycycline (VIBRA-TABS) tablet 100 mg     100 mg Oral Every 12 hours 11/11/16 1310     11/10/16 2200  piperacillin-tazobactam (ZOSYN) IVPB 3.375 g  Status:  Discontinued     3.375 g 12.5 mL/hr over 240 Minutes Intravenous Every 8 hours 11/10/16 1529 11/10/16 1539   11/10/16 1530  piperacillin-tazobactam (ZOSYN) IVPB 3.375 g  Status:  Discontinued     3.375 g 100 mL/hr over 30 Minutes Intravenous  Once 11/10/16 1528 11/10/16 1539   11/09/16 2245  linezolid (ZYVOX) IVPB 600 mg     600 mg 300 mL/hr over 60 Minutes Intravenous  Once 11/09/16 2230 11/09/16 2343   11/09/16 2100  piperacillin-tazobactam (ZOSYN) IVPB 3.375 g     3.375 g 100 mL/hr over 30 Minutes Intravenous  Once 11/09/16 2045 11/09/16 2236   11/09/16 2045  vancomycin (VANCOCIN) 2,000 mg in sodium chloride 0.9 % 500 mL IVPB  Status:  Discontinued     2,000 mg 250 mL/hr over  120 Minutes Intravenous  Once 11/09/16 2045 11/09/16 2229   11/09/16 1930  clindamycin (CLEOCIN) IVPB 600 mg     600 mg 100 mL/hr over 30 Minutes Intravenous  Once 11/09/16 1923 11/09/16 2027         ROS:  as in HPI otherwise remainder of 12 point Review of Systems is negative    Blood pressure 120/72, pulse 80, temperature 98.7 F (37.1 C), temperature source Oral, resp. rate 18, height 6' 4"  (1.93 m), weight 254 lb 3.2 oz (115.3 kg), SpO2 100 %. General: Alert and awake, oriented x3, not in any acute distress. HEENT: anicteric sclera,  EOMI, oropharynx clear and without exudate Cardiovascular: regular rate, normal r,  no murmur rubs or gallops Pulmonary: clear to auscultation  bilaterally, no wheezing, rales or rhonchi Gastrointestinal: soft nontender, nondistended, normal bowel sounds, Musculoskeletal: skin:  11/11/16:amputation site      Neuro: nonfocal, strength and sensation intact   Results for orders placed or performed during the hospital encounter of 11/09/16 (from the past 48 hour(s))  Comprehensive metabolic panel     Status: Abnormal   Collection Time: 11/09/16  7:12 PM  Result Value Ref Range   Sodium 138 135 - 145 mmol/L   Potassium 3.6 3.5 - 5.1 mmol/L   Chloride 104 101 - 111 mmol/L   CO2 23 22 - 32 mmol/L   Glucose, Bld 135 (H) 65 - 99 mg/dL   BUN 16 6 - 20 mg/dL   Creatinine, Ser 1.20 0.61 - 1.24 mg/dL   Calcium 9.0 8.9 - 10.3 mg/dL   Total Protein 6.9 6.5 - 8.1 g/dL   Albumin 3.7 3.5 - 5.0 g/dL   AST 78 (H) 15 - 41 U/L   ALT 119 (H) 17 - 63 U/L   Alkaline Phosphatase 265 (H) 38 - 126 U/L   Total Bilirubin 0.7 0.3 - 1.2 mg/dL   GFR calc non Af Amer >60 >60 mL/min   GFR calc Af Amer >60 >60 mL/min    Comment: (NOTE) The eGFR has been calculated using the CKD EPI equation. This calculation has not been validated in all clinical situations. eGFR's persistently <60 mL/min signify possible Chronic Kidney Disease.    Anion gap 11 5 - 15  CBC with Differential     Status: Abnormal   Collection Time: 11/09/16  7:12 PM  Result Value Ref Range   WBC 10.7 (H) 4.0 - 10.5 K/uL   RBC 4.37 4.22 - 5.81 MIL/uL   Hemoglobin 11.6 (L) 13.0 - 17.0 g/dL   HCT 35.7 (L) 39.0 - 52.0 %   MCV 81.7 78.0 - 100.0 fL   MCH 26.5 26.0 - 34.0 pg   MCHC 32.5 30.0 - 36.0 g/dL   RDW 13.6 11.5 - 15.5 %   Platelets 179 150 - 400 K/uL   Neutrophils Relative % 77 %   Neutro Abs 8.2 (H) 1.7 - 7.7 K/uL   Lymphocytes Relative 15 %   Lymphs Abs 1.6 0.7 - 4.0 K/uL   Monocytes Relative 7 %   Monocytes Absolute 0.8 0.1 - 1.0 K/uL   Eosinophils Relative 1 %   Eosinophils Absolute 0.1 0.0 - 0.7 K/uL   Basophils Relative 0 %   Basophils Absolute 0.0 0.0 - 0.1  K/uL  Protime-INR     Status: None   Collection Time: 11/09/16  7:12 PM  Result Value Ref Range   Prothrombin Time 13.9 11.4 - 15.2 seconds   INR 1.07   Culture, blood (Routine  x 2)     Status: None (Preliminary result)   Collection Time: 11/09/16  7:19 PM  Result Value Ref Range   Specimen Description BLOOD RIGHT ANTECUBITAL    Special Requests      BOTTLES DRAWN AEROBIC AND ANAEROBIC Blood Culture adequate volume   Culture NO GROWTH 2 DAYS    Report Status PENDING   Culture, blood (Routine x 2)     Status: None (Preliminary result)   Collection Time: 11/09/16  7:19 PM  Result Value Ref Range   Specimen Description BLOOD LEFT ANTECUBITAL    Special Requests      BOTTLES DRAWN AEROBIC ONLY Blood Culture adequate volume   Culture NO GROWTH 2 DAYS    Report Status PENDING   I-Stat CG4 Lactic Acid, ED     Status: None   Collection Time: 11/09/16  7:24 PM  Result Value Ref Range   Lactic Acid, Venous 1.11 0.5 - 1.9 mmol/L  Sedimentation rate     Status: Abnormal   Collection Time: 11/09/16  8:53 PM  Result Value Ref Range   Sed Rate 30 (H) 0 - 16 mm/hr  C-reactive protein     Status: Abnormal   Collection Time: 11/09/16  8:53 PM  Result Value Ref Range   CRP 2.3 (H) <1.0 mg/dL  Glucose, capillary     Status: None   Collection Time: 11/09/16 11:46 PM  Result Value Ref Range   Glucose-Capillary 98 65 - 99 mg/dL  Urinalysis, Routine w reflex microscopic     Status: None   Collection Time: 11/10/16  1:27 AM  Result Value Ref Range   Color, Urine YELLOW YELLOW   APPearance CLEAR CLEAR   Specific Gravity, Urine 1.018 1.005 - 1.030   pH 5.0 5.0 - 8.0   Glucose, UA NEGATIVE NEGATIVE mg/dL   Hgb urine dipstick NEGATIVE NEGATIVE   Bilirubin Urine NEGATIVE NEGATIVE   Ketones, ur NEGATIVE NEGATIVE mg/dL   Protein, ur NEGATIVE NEGATIVE mg/dL   Nitrite NEGATIVE NEGATIVE   Leukocytes, UA NEGATIVE NEGATIVE  Basic metabolic panel     Status: Abnormal   Collection Time: 11/10/16  5:58  AM  Result Value Ref Range   Sodium 139 135 - 145 mmol/L   Potassium 3.4 (L) 3.5 - 5.1 mmol/L   Chloride 106 101 - 111 mmol/L   CO2 24 22 - 32 mmol/L   Glucose, Bld 155 (H) 65 - 99 mg/dL   BUN 13 6 - 20 mg/dL   Creatinine, Ser 1.17 0.61 - 1.24 mg/dL   Calcium 8.5 (L) 8.9 - 10.3 mg/dL   GFR calc non Af Amer >60 >60 mL/min   GFR calc Af Amer >60 >60 mL/min    Comment: (NOTE) The eGFR has been calculated using the CKD EPI equation. This calculation has not been validated in all clinical situations. eGFR's persistently <60 mL/min signify possible Chronic Kidney Disease.    Anion gap 9 5 - 15  CBC     Status: Abnormal   Collection Time: 11/10/16  5:58 AM  Result Value Ref Range   WBC 9.3 4.0 - 10.5 K/uL   RBC 4.28 4.22 - 5.81 MIL/uL   Hemoglobin 11.3 (L) 13.0 - 17.0 g/dL   HCT 35.2 (L) 39.0 - 52.0 %   MCV 82.2 78.0 - 100.0 fL   MCH 26.4 26.0 - 34.0 pg   MCHC 32.1 30.0 - 36.0 g/dL   RDW 13.9 11.5 - 15.5 %   Platelets 166 150 - 400  K/uL  Glucose, capillary     Status: None   Collection Time: 11/10/16  9:03 AM  Result Value Ref Range   Glucose-Capillary 88 65 - 99 mg/dL  Glucose, capillary     Status: Abnormal   Collection Time: 11/10/16 12:30 PM  Result Value Ref Range   Glucose-Capillary 113 (H) 65 - 99 mg/dL  Glucose, capillary     Status: Abnormal   Collection Time: 11/10/16  4:38 PM  Result Value Ref Range   Glucose-Capillary 232 (H) 65 - 99 mg/dL  Glucose, capillary     Status: Abnormal   Collection Time: 11/10/16  5:09 PM  Result Value Ref Range   Glucose-Capillary 216 (H) 65 - 99 mg/dL  Uric acid     Status: None   Collection Time: 11/10/16  6:40 PM  Result Value Ref Range   Uric Acid, Serum 5.1 4.4 - 7.6 mg/dL  Glucose, capillary     Status: Abnormal   Collection Time: 11/10/16  8:51 PM  Result Value Ref Range   Glucose-Capillary 228 (H) 65 - 99 mg/dL  Comprehensive metabolic panel     Status: Abnormal   Collection Time: 11/11/16  4:28 AM  Result Value Ref  Range   Sodium 140 135 - 145 mmol/L   Potassium 3.6 3.5 - 5.1 mmol/L   Chloride 109 101 - 111 mmol/L   CO2 24 22 - 32 mmol/L   Glucose, Bld 115 (H) 65 - 99 mg/dL   BUN 9 6 - 20 mg/dL   Creatinine, Ser 0.99 0.61 - 1.24 mg/dL   Calcium 8.5 (L) 8.9 - 10.3 mg/dL   Total Protein 5.9 (L) 6.5 - 8.1 g/dL   Albumin 2.8 (L) 3.5 - 5.0 g/dL   AST 29 15 - 41 U/L   ALT 66 (H) 17 - 63 U/L   Alkaline Phosphatase 209 (H) 38 - 126 U/L   Total Bilirubin 0.6 0.3 - 1.2 mg/dL   GFR calc non Af Amer >60 >60 mL/min   GFR calc Af Amer >60 >60 mL/min    Comment: (NOTE) The eGFR has been calculated using the CKD EPI equation. This calculation has not been validated in all clinical situations. eGFR's persistently <60 mL/min signify possible Chronic Kidney Disease.    Anion gap 7 5 - 15  CBC     Status: Abnormal   Collection Time: 11/11/16  4:28 AM  Result Value Ref Range   WBC 7.7 4.0 - 10.5 K/uL   RBC 3.84 (L) 4.22 - 5.81 MIL/uL   Hemoglobin 10.1 (L) 13.0 - 17.0 g/dL   HCT 31.8 (L) 39.0 - 52.0 %   MCV 82.8 78.0 - 100.0 fL   MCH 26.3 26.0 - 34.0 pg   MCHC 31.8 30.0 - 36.0 g/dL   RDW 14.0 11.5 - 15.5 %   Platelets 152 150 - 400 K/uL  Glucose, capillary     Status: None   Collection Time: 11/11/16  7:50 AM  Result Value Ref Range   Glucose-Capillary 92 65 - 99 mg/dL  Glucose, capillary     Status: Abnormal   Collection Time: 11/11/16 11:35 AM  Result Value Ref Range   Glucose-Capillary 112 (H) 65 - 99 mg/dL  Glucose, capillary     Status: Abnormal   Collection Time: 11/11/16  4:41 PM  Result Value Ref Range   Glucose-Capillary 199 (H) 65 - 99 mg/dL   @BRIEFLABTABLE (sdes,specrequest,cult,reptstatus)   ) Recent Results (from the past 720 hour(s))  Culture, blood (Routine x  2)     Status: None (Preliminary result)   Collection Time: 11/09/16  7:19 PM  Result Value Ref Range Status   Specimen Description BLOOD RIGHT ANTECUBITAL  Final   Special Requests   Final    BOTTLES DRAWN AEROBIC AND  ANAEROBIC Blood Culture adequate volume   Culture NO GROWTH 2 DAYS  Final   Report Status PENDING  Incomplete  Culture, blood (Routine x 2)     Status: None (Preliminary result)   Collection Time: 11/09/16  7:19 PM  Result Value Ref Range Status   Specimen Description BLOOD LEFT ANTECUBITAL  Final   Special Requests   Final    BOTTLES DRAWN AEROBIC ONLY Blood Culture adequate volume   Culture NO GROWTH 2 DAYS  Final   Report Status PENDING  Incomplete     Impression/Recommendation  Principal Problem:   Osteomyelitis of left foot (HCC) Active Problems:   Type 2 diabetes mellitus with diabetic neuropathy, with long-term current use of insulin (HCC)   Essential hypertension   CKD (chronic kidney disease), stage III   Osteomyelitis (HCC)   Idiopathic chronic gout of left ankle without tophus   Cellulitis of left lower extremity   Fever   Deaven Urwin is a 51 y.o. male with  Amputation of part of great toe for osteomyelitis now with admission with subjective fevers ankle pain and MRI suggestive of osteomyelitis. Dr. Sharol Given feels the imaging is not convincing for osteomyelitis in the toe. Since I saw the patient the patient apparently had also developed hip pain and Dr. Sharol Given was able to aspirate some bloody material from the hip and is planning on taking to the upper room tomorrow.  #1 Infected hip:  --followup cultures from bedside aspirate --repeat cultures in OR for aerobic/anerobic pathogen --hold antibiotics pending deep cultures being obtained  #2 ? Osteo of toe: Dr. Sharol Given is skeptical of this. Would follow closely  #3 ? Gout: will need to see how ankle responds. I would not give protracted  steroids given now concern for infection in the hip.  11/11/2016, 4:52 PM   Thank you so much for this interesting consult  [place] [facility] [place] [phone] (pager) [phone] (office) 11/11/2016, 4:52 PM  Rhina Brack[place] 11/11/2016, 4:52 PM

## 2016-11-11 NOTE — Progress Notes (Signed)
Patient ID: Ryan Shaw, male   DOB: 01-27-1966, 51 y.o.   MRN: [id number] Uric acid normal at 5.1. Patient is primarily symptomatic around a swollen painful left ankle. I am not concerned with the trace increased uptake in the first metatarsal head. There is no edema within the metatarsal, no open ulcers, no swelling around the first metatarsal, no tenderness to palpation. Clinically there is no infection in the first metatarsal left foot  Recommend low dose prednisone 20 mg a day to help with the pain and swelling in the left ankle.  I will follow-up as an outpatient.

## 2016-11-12 ENCOUNTER — Inpatient Hospital Stay (HOSPITAL_COMMUNITY): Payer: Medicaid Other

## 2016-11-12 ENCOUNTER — Inpatient Hospital Stay (HOSPITAL_COMMUNITY): Payer: Medicaid Other | Admitting: Anesthesiology

## 2016-11-12 ENCOUNTER — Encounter (HOSPITAL_COMMUNITY): Payer: Self-pay | Admitting: Anesthesiology

## 2016-11-12 ENCOUNTER — Encounter (HOSPITAL_COMMUNITY)
Admission: EM | Disposition: A | Discharge status: Home / Self Care | Payer: Self-pay | Source: Home / Self Care | Attending: Family Medicine

## 2016-11-12 DIAGNOSIS — M86372 Chronic multifocal osteomyelitis, left ankle and foot: Secondary | ICD-10-CM

## 2016-11-12 DIAGNOSIS — M1A0720 Idiopathic chronic gout, left ankle and foot, without tophus (tophi): Principal | ICD-10-CM

## 2016-11-12 DIAGNOSIS — M25551 Pain in right hip: Secondary | ICD-10-CM

## 2016-11-12 DIAGNOSIS — M25571 Pain in right ankle and joints of right foot: Secondary | ICD-10-CM

## 2016-11-12 DIAGNOSIS — M71051 Abscess of bursa, right hip: Secondary | ICD-10-CM

## 2016-11-12 HISTORY — PX: INCISION AND DRAINAGE HIP: SHX1801

## 2016-11-12 LAB — COMPREHENSIVE METABOLIC PANEL
ALT: 54 U/L (ref 17–63)
AST: 23 U/L (ref 15–41)
Albumin: 3.1 g/dL — ABNORMAL LOW (ref 3.5–5.0)
Alkaline Phosphatase: 221 U/L — ABNORMAL HIGH (ref 38–126)
Anion gap: 5 (ref 5–15)
BUN: 9 mg/dL (ref 6–20)
CHLORIDE: 108 mmol/L (ref 101–111)
CO2: 27 mmol/L (ref 22–32)
Calcium: 9.2 mg/dL (ref 8.9–10.3)
Creatinine, Ser: 1.13 mg/dL (ref 0.61–1.24)
Glucose, Bld: 107 mg/dL — ABNORMAL HIGH (ref 65–99)
POTASSIUM: 3.8 mmol/L (ref 3.5–5.1)
Sodium: 140 mmol/L (ref 135–145)
Total Bilirubin: 0.6 mg/dL (ref 0.3–1.2)
Total Protein: 6.7 g/dL (ref 6.5–8.1)

## 2016-11-12 LAB — CBC
HEMATOCRIT: 34.2 % — AB (ref 39.0–52.0)
HEMOGLOBIN: 11 g/dL — AB (ref 13.0–17.0)
MCH: 26.6 pg (ref 26.0–34.0)
MCHC: 32.2 g/dL (ref 30.0–36.0)
MCV: 82.8 fL (ref 78.0–100.0)
Platelets: 193 10*3/uL (ref 150–400)
RBC: 4.13 MIL/uL — AB (ref 4.22–5.81)
RDW: 13.7 % (ref 11.5–15.5)
WBC: 8.3 10*3/uL (ref 4.0–10.5)

## 2016-11-12 LAB — GLUCOSE, CAPILLARY
GLUCOSE-CAPILLARY: 88 mg/dL (ref 65–99)
GLUCOSE-CAPILLARY: 76 mg/dL (ref 65–99)
GLUCOSE-CAPILLARY: 99 mg/dL (ref 65–99)
GLUCOSE-CAPILLARY: 127 mg/dL — AB (ref 65–99)
GLUCOSE-CAPILLARY: 234 mg/dL — AB (ref 65–99)
Glucose-Capillary: 88 mg/dL (ref 65–99)

## 2016-11-12 LAB — SEDIMENTATION RATE: Sed Rate: 57 mm/hr — ABNORMAL HIGH (ref 0–16)

## 2016-11-12 LAB — HEPATITIS PANEL, ACUTE
HCV AB: 0.1 {s_co_ratio} (ref 0.0–0.9)
HEP A IGM: NEGATIVE
HEP B C IGM: NEGATIVE
Hepatitis B Surface Ag: NEGATIVE

## 2016-11-12 LAB — PREALBUMIN: Prealbumin: 18.7 mg/dL (ref 18–38)

## 2016-11-12 LAB — C-REACTIVE PROTEIN: CRP: 5.7 mg/dL — AB (ref <1.0)

## 2016-11-12 SURGERY — IRRIGATION AND DEBRIDEMENT HIP
Anesthesia: General | Site: Hip | Laterality: Right

## 2016-11-12 MED ORDER — DOCUSATE SODIUM 100 MG PO CAPS
100.0000 mg | ORAL_CAPSULE | Freq: Two times a day (BID) | ORAL | Status: DC
  Administered 2016-11-12 – 2016-11-16 (×8): 100 mg via ORAL
  Filled 2016-11-12 (×8): qty 1

## 2016-11-12 MED ORDER — ACETAMINOPHEN 650 MG RE SUPP: 650.0000 mg | Freq: Four times a day (QID) | RECTAL | Status: DC | PRN

## 2016-11-12 MED ORDER — MUPIROCIN 2 % EX OINT
1.0000 "application " | TOPICAL_OINTMENT | Freq: Two times a day (BID) | CUTANEOUS | Status: DC
  Administered 2016-11-12 – 2016-11-16 (×9): 1 via NASAL
  Filled 2016-11-12 (×4): qty 22

## 2016-11-12 MED ORDER — MIDAZOLAM HCL 2 MG/2ML IJ SOLN
INTRAMUSCULAR | Status: AC
  Filled 2016-11-12: qty 2

## 2016-11-12 MED ORDER — ACETAMINOPHEN 325 MG PO TABS
650.0000 mg | ORAL_TABLET | Freq: Four times a day (QID) | ORAL | Status: DC | PRN
  Administered 2016-11-14 – 2016-11-15 (×2): 650 mg via ORAL
  Filled 2016-11-12 (×2): qty 2

## 2016-11-12 MED ORDER — PROPOFOL 10 MG/ML IV BOLUS
INTRAVENOUS | Status: DC | PRN
  Administered 2016-11-12: 200 mg via INTRAVENOUS

## 2016-11-12 MED ORDER — OXYCODONE HCL 5 MG PO TABS: 5.0000 mg | ORAL_TABLET | Freq: Once | ORAL | Status: DC | PRN

## 2016-11-12 MED ORDER — ONDANSETRON HCL 4 MG PO TABS: 4.0000 mg | ORAL_TABLET | Freq: Four times a day (QID) | ORAL | Status: DC | PRN

## 2016-11-12 MED ORDER — FENTANYL CITRATE (PF) 100 MCG/2ML IJ SOLN
INTRAMUSCULAR | Status: AC
  Filled 2016-11-12: qty 2

## 2016-11-12 MED ORDER — ONDANSETRON HCL 4 MG/2ML IJ SOLN: 4.0000 mg | Freq: Four times a day (QID) | INTRAMUSCULAR | Status: DC | PRN

## 2016-11-12 MED ORDER — METOCLOPRAMIDE HCL 5 MG/ML IJ SOLN: 5.0000 mg | Freq: Three times a day (TID) | INTRAMUSCULAR | Status: DC | PRN

## 2016-11-12 MED ORDER — PHENYLEPHRINE HCL 10 MG/ML IJ SOLN
INTRAMUSCULAR | Status: DC | PRN
  Administered 2016-11-12: 80 ug via INTRAVENOUS

## 2016-11-12 MED ORDER — PROPOFOL 10 MG/ML IV BOLUS
INTRAVENOUS | Status: AC
  Filled 2016-11-12: qty 20

## 2016-11-12 MED ORDER — MAGNESIUM CITRATE PO SOLN: 1.0000 | Freq: Once | ORAL | Status: DC | PRN

## 2016-11-12 MED ORDER — POLYETHYLENE GLYCOL 3350 17 G PO PACK
17.0000 g | PACK | Freq: Every day | ORAL | Status: DC | PRN
  Filled 2016-11-12: qty 1

## 2016-11-12 MED ORDER — CHLORHEXIDINE GLUCONATE CLOTH 2 % EX PADS
6.0000 | MEDICATED_PAD | Freq: Every day | CUTANEOUS | Status: DC
  Administered 2016-11-12 – 2016-11-16 (×4): 6 via TOPICAL

## 2016-11-12 MED ORDER — 0.9 % SODIUM CHLORIDE (POUR BTL) OPTIME
TOPICAL | Status: DC | PRN
  Administered 2016-11-12: 1000 mL

## 2016-11-12 MED ORDER — METHOCARBAMOL 1000 MG/10ML IJ SOLN
500.0000 mg | Freq: Four times a day (QID) | INTRAVENOUS | Status: DC | PRN
  Filled 2016-11-12: qty 5

## 2016-11-12 MED ORDER — FENTANYL CITRATE (PF) 100 MCG/2ML IJ SOLN
25.0000 ug | INTRAMUSCULAR | Status: DC | PRN
  Administered 2016-11-12 (×2): 50 ug via INTRAVENOUS

## 2016-11-12 MED ORDER — GLUCERNA SHAKE PO LIQD
237.0000 mL | Freq: Three times a day (TID) | ORAL | Status: DC
  Administered 2016-11-13 (×3): 237 mL via ORAL
  Filled 2016-11-12: qty 237

## 2016-11-12 MED ORDER — BISACODYL 10 MG RE SUPP: 10.0000 mg | Freq: Every day | RECTAL | Status: DC | PRN

## 2016-11-12 MED ORDER — MIDAZOLAM HCL 5 MG/5ML IJ SOLN
INTRAMUSCULAR | Status: DC | PRN
  Administered 2016-11-12: 2 mg via INTRAVENOUS

## 2016-11-12 MED ORDER — OXYCODONE HCL 5 MG PO TABS
5.0000 mg | ORAL_TABLET | ORAL | Status: DC | PRN
  Administered 2016-11-12 – 2016-11-16 (×14): 10 mg via ORAL
  Filled 2016-11-12 (×15): qty 2

## 2016-11-12 MED ORDER — SODIUM CHLORIDE 0.9 % IV SOLN
INTRAVENOUS | Status: DC
  Administered 2016-11-12: 19:00:00 via INTRAVENOUS

## 2016-11-12 MED ORDER — HYDROMORPHONE HCL 1 MG/ML IJ SOLN
1.0000 mg | INTRAMUSCULAR | Status: DC | PRN
  Administered 2016-11-12 – 2016-11-16 (×12): 1 mg via INTRAVENOUS
  Filled 2016-11-12 (×14): qty 1

## 2016-11-12 MED ORDER — LIDOCAINE HCL (CARDIAC) 20 MG/ML IV SOLN
INTRAVENOUS | Status: DC | PRN
  Administered 2016-11-12: 100 mg via INTRAVENOUS

## 2016-11-12 MED ORDER — CEFAZOLIN SODIUM-DEXTROSE 2-4 GM/100ML-% IV SOLN
2.0000 g | Freq: Once | INTRAVENOUS | Status: AC
  Administered 2016-11-12: 2 g via INTRAVENOUS
  Filled 2016-11-12 (×2): qty 100

## 2016-11-12 MED ORDER — CEFAZOLIN IN D5W 1 GM/50ML IV SOLN
1.0000 g | Freq: Four times a day (QID) | INTRAVENOUS | Status: AC
  Administered 2016-11-12 – 2016-11-13 (×3): 1 g via INTRAVENOUS
  Filled 2016-11-12 (×4): qty 50

## 2016-11-12 MED ORDER — FENTANYL CITRATE (PF) 250 MCG/5ML IJ SOLN
INTRAMUSCULAR | Status: AC
  Filled 2016-11-12: qty 5

## 2016-11-12 MED ORDER — OXYCODONE HCL 5 MG/5ML PO SOLN: 5.0000 mg | Freq: Once | ORAL | Status: DC | PRN

## 2016-11-12 MED ORDER — FENTANYL CITRATE (PF) 100 MCG/2ML IJ SOLN
INTRAMUSCULAR | Status: DC | PRN
  Administered 2016-11-12 (×5): 50 ug via INTRAVENOUS

## 2016-11-12 MED ORDER — SODIUM CHLORIDE 0.9 % IV SOLN
900.0000 mg | INTRAVENOUS | Status: DC
  Administered 2016-11-12 – 2016-11-14 (×3): 900 mg via INTRAVENOUS
  Filled 2016-11-12 (×3): qty 18

## 2016-11-12 MED ORDER — METHOCARBAMOL 500 MG PO TABS
500.0000 mg | ORAL_TABLET | Freq: Four times a day (QID) | ORAL | Status: DC | PRN
  Administered 2016-11-12 – 2016-11-16 (×3): 500 mg via ORAL
  Filled 2016-11-12 (×3): qty 1

## 2016-11-12 MED ORDER — METOCLOPRAMIDE HCL 5 MG PO TABS: 5.0000 mg | ORAL_TABLET | Freq: Three times a day (TID) | ORAL | Status: DC | PRN

## 2016-11-12 MED ORDER — ONDANSETRON HCL 4 MG/2ML IJ SOLN
INTRAMUSCULAR | Status: DC | PRN
  Administered 2016-11-12: 4 mg via INTRAVENOUS

## 2016-11-12 MED ORDER — LACTATED RINGERS IV SOLN
INTRAVENOUS | Status: DC
  Administered 2016-11-12: 15:00:00 via INTRAVENOUS

## 2016-11-12 MED ORDER — SODIUM CHLORIDE 0.9 % IR SOLN
Status: DC | PRN
  Administered 2016-11-12: 3000 mL

## 2016-11-12 SURGICAL SUPPLY — 30 items
COVER SURGICAL LIGHT HANDLE (MISCELLANEOUS) ×6 IMPLANT
DRAIN PENROSE 1/2X12 LTX STRL (WOUND CARE) ×3 IMPLANT
DRAPE ORTHO SPLIT 77X108 STRL (DRAPES) ×4
DRAPE SURG ORHT 6 SPLT 77X108 (DRAPES) ×2 IMPLANT
DRSG ADAPTIC 3X8 NADH LF (GAUZE/BANDAGES/DRESSINGS) ×3 IMPLANT
DRSG PAD ABDOMINAL 8X10 ST (GAUZE/BANDAGES/DRESSINGS) ×3 IMPLANT
DURAPREP 26ML APPLICATOR (WOUND CARE) ×3 IMPLANT
ELECT CAUTERY BLADE 6.4 (BLADE) ×3 IMPLANT
ELECT REM PT RETURN 9FT ADLT (ELECTROSURGICAL) ×3
ELECTRODE REM PT RTRN 9FT ADLT (ELECTROSURGICAL) ×1 IMPLANT
GAUZE SPONGE 4X4 12PLY STRL (GAUZE/BANDAGES/DRESSINGS) ×3 IMPLANT
GLOVE BIOGEL PI IND STRL 9 (GLOVE) ×1 IMPLANT
GLOVE BIOGEL PI INDICATOR 9 (GLOVE) ×2
GLOVE SURG ORTHO 9.0 STRL STRW (GLOVE) ×3 IMPLANT
GOWN STRL REUS W/ TWL XL LVL3 (GOWN DISPOSABLE) ×2 IMPLANT
GOWN STRL REUS W/TWL XL LVL3 (GOWN DISPOSABLE) ×4
HANDPIECE INTERPULSE COAX TIP (DISPOSABLE) ×2
KIT BASIN OR (CUSTOM PROCEDURE TRAY) ×3 IMPLANT
KIT ROOM TURNOVER OR (KITS) ×3 IMPLANT
MANIFOLD NEPTUNE II (INSTRUMENTS) ×3 IMPLANT
NS IRRIG 1000ML POUR BTL (IV SOLUTION) ×3 IMPLANT
PACK TOTAL JOINT (CUSTOM PROCEDURE TRAY) ×3 IMPLANT
PAD ARMBOARD 7.5X6 YLW CONV (MISCELLANEOUS) ×6 IMPLANT
SET HNDPC FAN SPRY TIP SCT (DISPOSABLE) ×1 IMPLANT
SPONGE LAP 18X18 X RAY DECT (DISPOSABLE) ×3 IMPLANT
SUT ETHILON 2 0 FS 18 (SUTURE) ×3 IMPLANT
SWAB COLLECTION DEVICE MRSA (MISCELLANEOUS) ×3 IMPLANT
SWAB CULTURE ESWAB REG 1ML (MISCELLANEOUS) ×3 IMPLANT
TAPE CLOTH SURG 6X10 WHT LF (GAUZE/BANDAGES/DRESSINGS) ×3 IMPLANT
TOWEL OR 17X26 10 PK STRL BLUE (TOWEL DISPOSABLE) ×3 IMPLANT

## 2016-11-12 NOTE — Progress Notes (Signed)
PROGRESS NOTE  Ryan Shaw  ZOX:[id number] DOB: 06-12-1966 DOA: 11/09/2016 PCP: Jaclyn Shaggy, MD  Brief Narrative:   Ryan Shaw is a 51 y.o. male with history of diabetes mellitus type 2, hypertension, hyperlipidemia presents to the ER with increasing pain over the amputation site of the left great toe. Patient had amputation about 6 weeks ago for infected toe. Patient had recently followed up with Dr. Lajoyce Corners last week at which time he was feeling well.  Since that appointment, he has had increasing redness, swelling and pain in the left foot, pain in tips of toes and also near ankles.  Last 2 days he developed fever and chills.  X-rays of the left foot showed collapse of the subchondral bone of the left foot amputation site. MRI also concerning for early distal 1st metatarsal osteomyelitis.  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  ABIs were normal and he has good pedal pulse.  Dr. Lajoyce Corners has been notified for biopsy vs. Amputation.  He felt it was likely gout and less likely infection.  Pt developed in the interim a right hip abscess and to OR for I &D 4/12 Dr. Lajoyce Corners.  ID following.  Assessment & Plan:   Principal Problem:   Osteomyelitis of left foot (HCC) Active Problems:   Type 2 diabetes mellitus with diabetic neuropathy, with long-term current use of insulin (HCC)   Essential hypertension   CKD (chronic kidney disease), stage III   Osteomyelitis (HCC)   Idiopathic chronic gout of left ankle without tophus   Cellulitis of left lower extremity   Fever   Right hip pain   Septic hip (HCC)   Acute right ankle pain  Left foot pain likely from gout per orthopedics -  Blood cultures NGTD -  Dr. Lajoyce Corners consulted -  ABIs normal -  Recent HIV NR  Diabetes mellitus type 2, uncontrolled, CBG well controlled since admission -  A1c 13.5 on 10/08/2016 CBG (last 3)   Recent Labs  11/11/16 2014  11/12/16 0844 11/12/16 1202  GLUCAP 152* 88 76   Hypertension, blood pressures stable -  Continue lisinopril and norvasc  Abscess right hip - I called and asked orthopedist Dr. Lajoyce Corners to eval and taking him to OR 4/12 for I&D. Bedside cultures done 4/11: Prelim G+ cocci in pairs, Pt is MRSA positive.   Hyperlipidemia, stable -  Continue statin  Tobacco abuse, advised to quit smoking  Mildly elevated LFTs, suspect fatty liver and dehydration.  Bili is normal -  acute hepatitis panel pending  DVT prophylaxis:  SCDs Code Status:  Full  Family Communication:  Patient Disposition Plan:  Home 1-2 days  Consultants:   Dr. Lajoyce Corners  Infectious disease  Procedures:   ABIs MRI left foot  Antimicrobials:  Anti-infectives    Start     Dose/Rate Route Frequency Ordered Stop   11/12/16 1545  ceFAZolin (ANCEF) IVPB 2g/100 mL premix     2 g 200 mL/hr over 30 Minutes Intravenous  Once 11/12/16 0844     11/12/16 0600  ceFAZolin (ANCEF) IVPB 2g/100 mL premix  Status:  Discontinued     2 g 200 mL/hr over 30 Minutes Intravenous On call to O.R. 11/11/16 2039 11/12/16 0844   11/11/16 1315  doxycycline (VIBRA-TABS) tablet 100 mg  Status:  Discontinued     100 mg Oral Every 12 hours 11/11/16 1310 11/11/16 1658   11/10/16 2200  piperacillin-tazobactam (  ZOSYN) IVPB 3.375 g  Status:  Discontinued     3.375 g 12.5 mL/hr over 240 Minutes Intravenous Every 8 hours 11/10/16 1529 11/10/16 1539   11/10/16 1530  piperacillin-tazobactam (ZOSYN) IVPB 3.375 g  Status:  Discontinued     3.375 g 100 mL/hr over 30 Minutes Intravenous  Once 11/10/16 1528 11/10/16 1539   11/09/16 2245  linezolid (ZYVOX) IVPB 600 mg     600 mg 300 mL/hr over 60 Minutes Intravenous  Once 11/09/16 2230 11/09/16 2343   11/09/16 2100  piperacillin-tazobactam (ZOSYN) IVPB 3.375 g     3.375 g 100 mL/hr over 30 Minutes Intravenous  Once 11/09/16 2045 11/09/16 2236   11/09/16 2045  vancomycin (VANCOCIN) 2,000 mg in sodium chloride  0.9 % 500 mL IVPB  Status:  Discontinued     2,000 mg 250 mL/hr over 120 Minutes Intravenous  Once 11/09/16 2045 11/09/16 2229   11/09/16 1930  clindamycin (CLEOCIN) IVPB 600 mg     600 mg 100 mL/hr over 30 Minutes Intravenous  Once 11/09/16 1923 11/09/16 2027       Subjective:  Pt would like to eat something. Has been NPO for procedure planned for today.     Objective: Vitals:   11/12/16 0456 11/12/16 0847 11/12/16 1106 11/12/16 1117  BP: 116/73 108/64 116/68   Pulse: 74 82 78   Resp: Temp: 98.7 F (37.1 C)  98.8 F (37.1 C) 97.9 F (36.6 C)  TempSrc: Oral  Oral Oral  SpO2: 100% 97% 98%   Weight:      Height:        Intake/Output Summary (Last 24 hours) at 11/12/16 1324 Last data filed at 11/12/16 1004  Gross per 24 hour  Intake              480 ml  Output             1100 ml  Net             -620 ml   Filed Weights   11/10/16 0002 11/10/16 2055 11/11/16 2017  Weight: 112.4 kg (247 lb 11.2 oz) 115.3 kg (254 lb 3.2 oz) 111.2 kg (245 lb 1.6 oz)    Examination:  General exam:  Adult male.  No acute distress.  HEENT:  NCAT, MMM Respiratory system: Clear to auscultation bilaterally Cardiovascular system: Regular rate and rhythm, normal S1/S2. No murmurs, rubs, gallops or clicks.  Warm extremities Gastrointestinal system: Normal active bowel sounds, soft, nondistended, nontender. MSK:  Normal tone and bulk, left foot with 1st toe amputation.  Abscess right hip fluctuant and hot to touch.  Eschar overlies previous surgical scar area.  Nontender to palpation, no overlying erythema.  Skin is somewhat indurated and surrounding tissues are swollen.  Whole foot has 1+ pitting edema and there is a 5mm ulcer on the dorsum of the left foot with surrounding erythema.   Neuro:  Grossly intact.   Data Reviewed: I have personally reviewed following labs and imaging studies  CBC:  Recent Labs Lab 11/09/16 1912 11/10/16 0558 11/11/16 0428 11/12/16 0429  WBC 10.7*  9.3 7.7 8.3  NEUTROABS 8.2*  --   --   --   HGB 11.6* 11.3* 10.1* 11.0*  HCT 35.7* 35.2* 31.8* 34.2*  MCV 81.7 82.2 82.8 82.8  PLT 179 166 152 193   Basic Metabolic Panel:  Recent Labs Lab 11/09/16 1912 11/10/16 0558 11/11/16 0428 11/12/16 0429  NA 138 139 140 140  K 3.6 3.4* 3.6 3.8  CL 104 106 109 108  CO2 GLUCOSE 135* 155* 115* 107*  BUN CREATININE 1.20 1.17 0.99 1.13  CALCIUM 9.0 8.5* 8.5* 9.2   GFR: Estimated Creatinine Clearance: 106.9 mL/min (by C-G formula based on SCr of 1.13 mg/dL). Liver Function Tests:  Recent Labs Lab 11/09/16 1912 11/11/16 0428 11/12/16 0429  AST 78* 29 23  ALT 119* 66* 54  ALKPHOS 265* 209* 221*  BILITOT 0.7 0.6 0.6  PROT 6.9 5.9* 6.7  ALBUMIN 3.7 2.8* 3.1*   No results for input(s): LIPASE, AMYLASE in the last 168 hours. No results for input(s): AMMONIA in the last 168 hours. Coagulation Profile:  Recent Labs Lab 11/09/16 1912  INR 1.07   Cardiac Enzymes: No results for input(s): CKTOTAL, CKMB, CKMBINDEX, TROPONINI in the last 168 hours. BNP (last 3 results) No results for input(s): PROBNP in the last 8760 hours. HbA1C: No results for input(s): HGBA1C in the last 72 hours. CBG:  Recent Labs Lab 11/11/16 1135 11/11/16 1641 11/11/16 2014 11/12/16 0844 11/12/16 1202  GLUCAP 112* 199* 152* 88 76   Lipid Profile: No results for input(s): CHOL, HDL, LDLCALC, TRIG, CHOLHDL, LDLDIRECT in the last 72 hours. Thyroid Function Tests: No results for input(s): TSH, T4TOTAL, FREET4, T3FREE, THYROIDAB in the last 72 hours. Anemia Panel: No results for input(s): VITAMINB12, FOLATE, FERRITIN, TIBC, IRON, RETICCTPCT in the last 72 hours. Urine analysis:    Component Value Date/Time   COLORURINE YELLOW 11/10/2016 0127   APPEARANCEUR CLEAR 11/10/2016 0127   LABSPEC 1.018 11/10/2016 0127   PHURINE 5.0 11/10/2016 0127   GLUCOSEU NEGATIVE 11/10/2016 0127   HGBUR NEGATIVE 11/10/2016 0127   BILIRUBINUR  NEGATIVE 11/10/2016 0127   BILIRUBINUR neg 10/24/2014 1152   KETONESUR NEGATIVE 11/10/2016 0127   PROTEINUR NEGATIVE 11/10/2016 0127   UROBILINOGEN 0.2 03/30/2015 2059   NITRITE NEGATIVE 11/10/2016 0127   LEUKOCYTESUR NEGATIVE 11/10/2016 0127    Recent Results (from the past 240 hour(s))  Culture, blood (Routine x 2)     Status: None (Preliminary result)   Collection Time: 11/09/16  7:19 PM  Result Value Ref Range Status   Specimen Description BLOOD RIGHT ANTECUBITAL  Final   Special Requests   Final    BOTTLES DRAWN AEROBIC AND ANAEROBIC Blood Culture adequate volume   Culture NO GROWTH 3 DAYS  Final   Report Status PENDING  Incomplete  Culture, blood (Routine x 2)     Status: None (Preliminary result)   Collection Time: 11/09/16  7:19 PM  Result Value Ref Range Status   Specimen Description BLOOD LEFT ANTECUBITAL  Final   Special Requests   Final    BOTTLES DRAWN AEROBIC ONLY Blood Culture adequate volume   Culture NO GROWTH 3 DAYS  Final   Report Status PENDING  Incomplete  Body fluid culture     Status: None (Preliminary result)   Collection Time: 11/11/16  2:26 PM  Result Value Ref Range Status   Specimen Description FLUID RIGHT HIP  Final   Special Requests JOINT HIP SWAB  Final   Gram Stain   Final    MODERATE WBC PRESENT, PREDOMINANTLY PMN RARE GRAM POSITIVE COCCI IN CLUSTERS IN PAIRS    Culture PENDING  Incomplete   Report Status PENDING  Incomplete  Surgical pcr screen     Status: Abnormal   Collection Time: 11/11/16  8:53 PM  Result Value Ref Range Status   MRSA,  PCR POSITIVE (A) NEGATIVE Final    Comment: RESULT CALLED TO, READ BACK BY AND VERIFIED WITH: C BOKIAGON,RN  11/11/16 MKELLY,MLT    Staphylococcus aureus POSITIVE (A) NEGATIVE Final    Comment:        The Xpert SA Assay (FDA approved for NASAL specimens in patients over 68 years of age), is one component of a comprehensive surveillance program.  Test performance has been validated by  [facility] for patients greater than or equal to 64 year old. It is not intended to diagnose infection nor to guide or monitor treatment.     Radiology Studies: Dg Hip Unilat With Pelvis 2-3 Views Right  Result Date: 11/11/2016 CLINICAL DATA:  Acute onset of right lateral hip pain and swelling. Initial encounter. EXAM: DG HIP (WITH OR WITHOUT PELVIS) 2-3V RIGHT COMPARISON:  None. FINDINGS: There is no evidence of fracture or dislocation. Both femoral heads are seated normally within their respective acetabula. The proximal right femur appears intact. No significant degenerative change is appreciated. The sacroiliac joints are unremarkable in appearance. The visualized bowel gas pattern is grossly unremarkable in appearance. IMPRESSION: No evidence of fracture or dislocation. Electronically Signed   By: Roanna Raider M.D.   On: 11/11/2016 00:40   Scheduled Meds: . amLODipine  10 mg Oral QHS  . atorvastatin  40 mg Oral QHS  .  ceFAZolin (ANCEF) IV  2 g Intravenous Once  . Chlorhexidine Gluconate Cloth  6 each Topical Q0600  . insulin aspart  0-9 Units Subcutaneous TID WC  . insulin detemir  50 Units Subcutaneous QHS  . lisinopril  5 mg Oral QHS  . metoprolol tartrate  25 mg Oral BID  . mupirocin ointment  1 application Nasal BID  . pregabalin  100 mg Oral BID   Continuous Infusions:   LOS: 3 days   Time spent: 33 min  Standley Dakins, MD [facility] Pager [phone]  If 7PM-7AM, please contact night-coverage [email or web address] Password [email or web address] 11/12/2016, 1:24 PM

## 2016-11-12 NOTE — Anesthesia Procedure Notes (Signed)
Procedure Name: LMA Insertion Date/Time: 11/12/2016 5:14 PM Performed by: Rosiland Oz Pre-anesthesia Checklist: Emergency Drugs available, Patient identified, Suction available, Patient being monitored and Timeout performed Patient Re-evaluated:Patient Re-evaluated prior to inductionOxygen Delivery Method: Circle system utilized Preoxygenation: Pre-oxygenation with 100% oxygen Intubation Type: IV induction Ventilation: Mask ventilation without difficulty LMA: LMA inserted LMA Size: 5.0 Number of attempts: 1 Placement Confirmation: positive ETCO2 and breath sounds checked- equal and bilateral Tube secured with: Tape Dental Injury: Teeth and Oropharynx as per pre-operative assessment

## 2016-11-12 NOTE — Progress Notes (Signed)
Pharmacy Antibiotic Note Ryan Shaw is a 51 y.o. male admitted on 11/09/2016 with septic hip. Pharmacy has been consulted for Daptomycin dosing.  Plan: 1. Daptomycin 900 mg (1 mg/kg ) every 24 hours 2. Weekly CK 3. Follow up final culture data   Height:  (193 cm) Weight: 245 lb 1.6 oz (111.2 kg) IBW/kg (Calculated) : 86.8  Temp (24hrs), Avg:98.6 F (37 C), Min:97.9 F (36.6 C), Max:98.8 F (37.1 C)   Recent Labs Lab 11/09/16 1912 11/09/16 1924 11/10/16 0558 11/11/16 0428 11/12/16 0429  WBC 10.7*  --  9.3 7.7 8.3  CREATININE 1.20  --  1.17 0.99 1.13  LATICACIDVEN  --  1.11  --   --   --     Estimated Creatinine Clearance: 106.9 mL/min (by C-G formula based on SCr of 1.13 mg/dL).    Allergies  Allergen Reactions  . Other Nausea And Vomiting    NeuRemedy - Dietary Supplement  . Vancomycin Other (See Comments)    Given after surgery in January 2018 at [facility] - shut kidneys down per pt    Microbiology results: 4/9 BCx: ngtd 4/9 MRSA PCR: positive  4/11 R hip fluid: staph aureus 4/12 R hip op cx: px  Thank you for allowing pharmacy to be a part of this patient's care.  Pollyann Samples, PharmD, BCPS 11/12/2016, 8:38 PM

## 2016-11-12 NOTE — Transfer of Care (Signed)
Immediate Anesthesia Transfer of Care Note  Patient: Ryan Shaw  Procedure(s) Performed: Procedure(s): IRRIGATION AND DEBRIDEMENT RIGHT HIP (Right)  Patient Location: PACU  Anesthesia Type:General  Level of Consciousness: awake, alert , oriented and patient cooperative  Airway & Oxygen Therapy: Patient Spontanous Breathing  Post-op Assessment: Report given to RN and Post -op Vital signs reviewed and stable  Post vital signs: Reviewed and stable  Last Vitals:  Vitals:   11/12/16 1106 11/12/16 1117  BP: 116/68   Pulse: 78   Resp: 18   Temp: 37.1 C 36.6 C    Last Pain:  Vitals:   11/12/16 1234  TempSrc:   PainSc: 2       Patients Stated Pain Goal: 0 (11/12/16 0117)  Complications: No apparent anesthesia complications

## 2016-11-12 NOTE — Progress Notes (Signed)
Initial Nutrition Assessment  DOCUMENTATION CODES:   Not applicable  INTERVENTION:  Once diet advances, provide Glucerna Shake po TID, each supplement provides 220 kcal and 10 grams of protein.  NUTRITION DIAGNOSIS:   Increased nutrient needs related to wound healing as evidenced by estimated needs.  GOAL:   Patient will meet greater than or equal to 90% of their needs  MONITOR:   Supplement acceptance, Labs, Weight trends, Skin, I & O's, Diet advancement  REASON FOR ASSESSMENT:   Consult Wound healing  ASSESSMENT:   51 y.o. male with history of diabetes mellitus type 2, hypertension, hyperlipidemia presents to the ER with increasing pain over the amputation site of the left great toe. Patient had amputation about 6 weeks ago for infected toe. Patient had recently followed up with Dr. Lajoyce Corners last week at which time he was feeling well.  Since that appointment, he has had increasing redness, swelling and pain in the left foot, pain in tips of toes and also near ankles.  Last 2 days he developed fever and chills.  X-rays of the left foot showed collapse of the subchondral bone of the left foot amputation site. MRI also concerning for early distal 1st metatarsal osteomyelitis. Dr. Lajoyce Corners has been notified for biopsy vs. Amputation.  He felt it was likely gout and less likely infection.  Pt developed in the interim a right hip abscess and to OR for I &D 4/12 Dr. Lajoyce Corners.  Pt is currently NPO for surgery today. Meal completion prior to NPO status has been 100%. Pt reports eating well PTA with usual consumption of at least 2 meals a day with snacks in between. Pt with no weight loss. Pt is agreeable to nutritional supplements to aid in wound healing. RD to order.   Pt with no observed significant fat or muscle mass loss.   Labs and medications reviewed.   Diet Order:  Diet NPO time specified  Skin:  Wound (see comment) (Diabetic ulcer to feet)  Last BM:  4/11  Height:   Ht Readings  from Last 1 Encounters:  11/10/16  (1.93 m)    Weight:   Wt Readings from Last 1 Encounters:  11/11/16 245 lb 1.6 oz (111.2 kg)    Ideal Body Weight:  91.8 kg  BMI:  Body mass index is 29.83 kg/m.  Estimated Nutritional Needs:   Kcal:  2300-2500  Protein:  115-135 grams  Fluid:  >/= 2.3 L/day  EDUCATION NEEDS:   No education needs identified at this time  Roslyn Smiling, MS, RD, LDN Pager # [phone] After hours/ weekend pager # [phone]

## 2016-11-12 NOTE — Interval H&P Note (Signed)
History and Physical Interval Note:  11/12/2016 6:07 AM  Ryan Shaw  has presented today for surgery, with the diagnosis of Abscess Right Hip  The various methods of treatment have been discussed with the patient and family. After consideration of risks, benefits and other options for treatment, the patient has consented to  Procedure(s): IRRIGATION AND DEBRIDEMENT RIGHT HIP (Right) as a surgical intervention .  The patient's history has been reviewed, patient examined, no change in status, stable for surgery.  I have reviewed the patient's chart and labs.  Questions were answered to the patient's satisfaction.     Nadara Mustard

## 2016-11-12 NOTE — Care Management Note (Signed)
Case Management Note  Patient Details  Name: Ryan Shaw MRN: [id number] Date of Birth: 1966/03/31  Subjective/Objective:     CM following for progression and d/c planning.                Action/Plan: 11/12/2016 Noted CM consult for [facility] and DME, will follow for needs, please be aware that this pt is uninsured therefore we will be able to get [facility] services only.  Also noted referral to CSW for medication assistance, this is managed by CM, however this pt was provided with a MATCH letter in March 2018 , therefore would not be eligible for [facility]. Will discuss with dept assist director for possible override to assist. Will continue to follow for needs.  Expected Discharge Date:                  Expected Discharge Plan:  Home w Home Health Services  In-House Referral:  Clinical Social Work  Discharge planning Services  CM Consult, Medication Assistance, Indigent Health Clinic  Post Acute Care Choice:  Durable Medical Equipment, Home Health Choice offered to:  NA  DME Arranged:    DME Agency:     HH Arranged:  RN HH Agency:  [facility]  Status of Service:  In process, will continue to follow  If discussed at Long Length of Stay Meetings, dates discussed:    Additional Comments:  Starlyn Skeans, RN 11/12/2016, 10:41 AM

## 2016-11-12 NOTE — H&P (View-Only) (Signed)
Patient ID: Ryan Shaw, male   DOB: 1965-10-03, 51 y.o.   MRN: [id number] Patient states that last night he developed a new swelling area over the lateral aspect of the right hip. Patient is not on any blood thinners. Patient states it's painful to try to lay on the right hip. Patient has no fever or chills. Examination there is hard indurated area over the greater trochanter of the right hip this is tender to palpation. After informed consent this was sterilely prepped and aspirated with an 18-gauge needle a small amount of bloody purulent material was aspirated. This was sent for cultures. We'll plan for surgery tomorrow after 4 PM for surgical incision and debridement of the abscess.

## 2016-11-12 NOTE — Progress Notes (Signed)
Subjective:  Hip pain    Antibiotics:  Anti-infectives    Start     Dose/Rate Route Frequency Ordered Stop   11/12/16 2300  ceFAZolin (ANCEF) IVPB 1 g/50 mL premix     1 g 100 mL/hr over 30 Minutes Intravenous Every 6 hours 11/12/16 1830 11/13/16 1659   11/12/16 1545  ceFAZolin (ANCEF) IVPB 2g/100 mL premix     2 g 200 mL/hr over 30 Minutes Intravenous  Once 11/12/16 0844 11/12/16 1717   11/12/16 0600  ceFAZolin (ANCEF) IVPB 2g/100 mL premix  Status:  Discontinued     2 g 200 mL/hr over 30 Minutes Intravenous On call to O.R. 11/11/16 2039 11/12/16 0844   11/11/16 1315  doxycycline (VIBRA-TABS) tablet 100 mg  Status:  Discontinued     100 mg Oral Every 12 hours 11/11/16 1310 11/11/16 1658   11/10/16 2200  piperacillin-tazobactam (ZOSYN) IVPB 3.375 g  Status:  Discontinued     3.375 g 12.5 mL/hr over 240 Minutes Intravenous Every 8 hours 11/10/16 1529 11/10/16 1539   11/10/16 1530  piperacillin-tazobactam (ZOSYN) IVPB 3.375 g  Status:  Discontinued     3.375 g 100 mL/hr over 30 Minutes Intravenous  Once 11/10/16 1528 11/10/16 1539   11/09/16 2245  linezolid (ZYVOX) IVPB 600 mg     600 mg 300 mL/hr over 60 Minutes Intravenous  Once 11/09/16 2230 11/09/16 2343   11/09/16 2100  piperacillin-tazobactam (ZOSYN) IVPB 3.375 g     3.375 g 100 mL/hr over 30 Minutes Intravenous  Once 11/09/16 2045 11/09/16 2236   11/09/16 2045  vancomycin (VANCOCIN) 2,000 mg in sodium chloride 0.9 % 500 mL IVPB  Status:  Discontinued     2,000 mg 250 mL/hr over 120 Minutes Intravenous  Once 11/09/16 2045 11/09/16 2229   11/09/16 1930  clindamycin (CLEOCIN) IVPB 600 mg     600 mg 100 mL/hr over 30 Minutes Intravenous  Once 11/09/16 1923 11/09/16 2027      Medications: Scheduled Meds: . amLODipine  10 mg Oral QHS  . atorvastatin  40 mg Oral QHS  .  ceFAZolin (ANCEF) IV  1 g Intravenous Q6H  . Chlorhexidine Gluconate Cloth  6 each Topical Q0600  . docusate sodium  100 mg Oral BID  .  [START ON 11/13/2016] feeding supplement (GLUCERNA SHAKE)  237 mL Oral TID BM  . fentaNYL      . insulin aspart  0-9 Units Subcutaneous TID WC  . insulin detemir  50 Units Subcutaneous QHS  . lisinopril  5 mg Oral QHS  . metoprolol tartrate  25 mg Oral BID  . mupirocin ointment  1 application Nasal BID  . pregabalin  100 mg Oral BID   Continuous Infusions: . sodium chloride 10 mL/hr at 11/12/16 1856  . lactated ringers Stopped (11/12/16 1746)   PRN Meds:.acetaminophen **OR** acetaminophen, bisacodyl, HYDROmorphone (DILAUDID) injection, insulin aspart, magnesium citrate, methocarbamol **OR** methocarbamol (ROBAXIN)  IV, metoCLOPramide **OR** metoCLOPramide (REGLAN) injection, ondansetron **OR** ondansetron (ZOFRAN) IV, oxyCODONE, polyethylene glycol    Objective: Weight change: -9 lb 1.6 oz (-4.128 kg)  Intake/Output Summary (Last 24 hours) at 11/12/16 2025 Last data filed at 11/12/16 1900  Gross per 24 hour  Intake              960 ml  Output             1305 ml  Net             -  345 ml   Blood pressure (!) 108/55, pulse 79, temperature 98.7 F (37.1 C), temperature source Oral, resp. rate 18, height  (1.93 m), weight 245 lb 1.6 oz (111.2 kg), SpO2 97 %. Temp:  [97.9 F (36.6 C)-98.8 F (37.1 C)] 98.7 F (37.1 C) (04/12 1849) Pulse Rate:  [74-95] 79 (04/12 1849) Resp:  [14-18] 18 (04/12 1849) BP: (108-126)/(55-77) 108/55 (04/12 1849) SpO2:  [95 %-100 %] 97 % (04/12 1849)  Physical Exam:  General: Alert and awake, oriented x3, not in any acute distress. HEENT: anicteric sclera,  EOMI, oropharynx clear and without exudate Cardiovascular: regular rate, normal r,  no murmur rubs or gallops Pulmonary: clear to auscultation bilaterally, no wheezing, rales or rhonchi Gastrointestinal: soft nontender, nondistended, normal bowel sounds, Musculoskeletal: skin:  Right hip is tender  11/11/16:amputation site      Neuro: nonfocal, strength and sensation  intact    CBC:  CBC Latest Ref Rng & Units 11/12/2016 11/11/2016 11/10/2016  WBC 4.0 - 10.5 K/uL 8.3 7.7 9.3  Hemoglobin 13.0 - 17.0 g/dL 11.0(L) 10.1(L) 11.3(L)  Hematocrit 39.0 - 52.0 % 34.2(L) 31.8(L) 35.2(L)  Platelets 150 - 400 K/uL 193 152 166    BMET  Recent Labs  11/11/16 0428 11/12/16 0429  NA 140 140  K 3.6 3.8  CL 109 108  CO2 24 27  GLUCOSE 115* 107*  BUN 9 9  CREATININE 0.99 1.13  CALCIUM 8.5* 9.2     Liver Panel   Recent Labs  11/11/16 0428 11/12/16 0429  PROT 5.9* 6.7  ALBUMIN 2.8* 3.1*  AST 29 23  ALT 66* 54  ALKPHOS 209* 221*  BILITOT 0.6 0.6       Sedimentation Rate  Recent Labs  11/12/16 0429  ESRSEDRATE 57*   C-Reactive Protein  Recent Labs  11/09/16 2053 11/12/16 0429  CRP 2.3* 5.7*    Micro Results: Recent Results (from the past 720 hour(s))  Culture, blood (Routine x 2)     Status: None (Preliminary result)   Collection Time: 11/09/16  7:19 PM  Result Value Ref Range Status   Specimen Description BLOOD RIGHT ANTECUBITAL  Final   Special Requests   Final    BOTTLES DRAWN AEROBIC AND ANAEROBIC Blood Culture adequate volume   Culture NO GROWTH 3 DAYS  Final   Report Status PENDING  Incomplete  Culture, blood (Routine x 2)     Status: None (Preliminary result)   Collection Time: 11/09/16  7:19 PM  Result Value Ref Range Status   Specimen Description BLOOD LEFT ANTECUBITAL  Final   Special Requests   Final    BOTTLES DRAWN AEROBIC ONLY Blood Culture adequate volume   Culture NO GROWTH 3 DAYS  Final   Report Status PENDING  Incomplete  Body fluid culture     Status: None (Preliminary result)   Collection Time: 11/11/16  2:26 PM  Result Value Ref Range Status   Specimen Description FLUID RIGHT HIP  Final   Special Requests JOINT HIP SWAB  Final   Gram Stain   Final    MODERATE WBC PRESENT, PREDOMINANTLY PMN RARE GRAM POSITIVE COCCI IN CLUSTERS IN PAIRS    Culture   Final    MODERATE STAPHYLOCOCCUS  AUREUS SUSCEPTIBILITIES TO FOLLOW    Report Status PENDING  Incomplete  Surgical pcr screen     Status: Abnormal   Collection Time: 11/11/16  8:53 PM  Result Value Ref Range Status   MRSA, PCR POSITIVE (A) NEGATIVE Final  Comment: RESULT CALLED TO, READ BACK BY AND VERIFIED WITH: C BOKIAGON,RN  11/11/16 MKELLY,MLT    Staphylococcus aureus POSITIVE (A) NEGATIVE Final    Comment:        The Xpert SA Assay (FDA approved for NASAL specimens in patients over 50 years of age), is one component of a comprehensive surveillance program.  Test performance has been validated by [facility] for patients greater than or equal to 6 year old. It is not intended to diagnose infection nor to guide or monitor treatment.     Studies/Results: Dg Hip Unilat With Pelvis 2-3 Views Right  Result Date: 11/11/2016 CLINICAL DATA:  Acute onset of right lateral hip pain and swelling. Initial encounter. EXAM: DG HIP (WITH OR WITHOUT PELVIS) 2-3V RIGHT COMPARISON:  None. FINDINGS: There is no evidence of fracture or dislocation. Both femoral heads are seated normally within their respective acetabula. The proximal right femur appears intact. No significant degenerative change is appreciated. The sacroiliac joints are unremarkable in appearance. The visualized bowel gas pattern is grossly unremarkable in appearance. IMPRESSION: No evidence of fracture or dislocation. Electronically Signed   By: Roanna Raider M.D.   On: 11/11/2016 00:40      Assessment/Plan:  INTERVAL HISTORY: patient has had surgery since I last saw him this am and deep cultures obtained   Principal Problem:   Osteomyelitis of left foot (HCC) Active Problems:   Type 2 diabetes mellitus with diabetic neuropathy, with long-term current use of insulin (HCC)   Essential hypertension   CKD (chronic kidney disease), stage III   Osteomyelitis (HCC)   Idiopathic chronic gout of left ankle without tophus   Cellulitis of left lower  extremity   Fever   Right hip pain   Septic hip (HCC)   Acute right ankle pain   Abscess of bursa of right hip    Ryan Shaw is a 51 y.o. male with  with  Amputation of part of great toe for osteomyelitis now with admission with subjective fevers ankle pain and MRI suggestive of osteomyelitis. Dr. Lajoyce Corners feels the imaging is not convincing for osteomyelitis in the toe. Since I saw the patient the patient apparently had also developed hip pain and Dr. Lajoyce Corners was able to aspirate some bloody material from the hip and is planning on taking to the upper room tomorrow.  #1 Infected hip:  --followup cultures from bedside aspirate and deep cultures --start daptomycin given his alleged allergy to PCN   #2 ? Osteo of toe: Dr. Lajoyce Corners is skeptical of this. Would follow closely    LOS: 3 days   Ryan Shaw 11/12/2016, 8:25 PM

## 2016-11-12 NOTE — Op Note (Signed)
11/09/2016 - 11/12/2016  5:38 PM  PATIENT:  Ryan Shaw    PRE-OPERATIVE DIAGNOSIS:  Abscess Right Hip  POST-OPERATIVE DIAGNOSIS:  Same  PROCEDURE:  IRRIGATION AND DEBRIDEMENT RIGHT HIP Local tissue rearrangement for wound closure 5 x 3 cm.  SURGEON:  Nadara Mustard, MD  PHYSICIAN ASSISTANT:None ANESTHESIA:   General  PREOPERATIVE INDICATIONS:  [name] is a  51 y.o. male with a diagnosis of Abscess Right Hip who failed conservative measures and elected for surgical management.    The risks benefits and alternatives were discussed with the patient preoperatively including but not limited to the risks of infection, bleeding, nerve injury, cardiopulmonary complications, the need for revision surgery, among others, and the patient was willing to proceed.  OPERATIVE IMPLANTS: Penrose drain  OPERATIVE FINDINGS: Deep abscess cultures repeated 2  OPERATIVE PROCEDURE: Patient brought the operating room and underwent a general anesthetic. After adequate levels anesthesia obtained patient's was placed in a floppy lateral position with the right side up and the right lower extremity was prepped using DuraPrep draped into a sterile field a timeout was called. An longitudinal incision was made and the blistered area of the abscess was excised in 1 block of tissue. Deep cultures were obtained. Pulsatile lavage was used to cleanse out the cavity. There is no necrotic tissue no necrotizing fasciitis there was good petechial bleeding. Deep area of the abscess involvement was approximately 5 cm in diameter. Local tissue rearrangement was used for wound closure 5 x 3 cm. The wound was closed loosely with 2-0 nylon a Penrose drain was placed deep in the wound a sterile dressing was applied patient was extubated taken to the PACU in stable condition.

## 2016-11-12 NOTE — Anesthesia Preprocedure Evaluation (Signed)
Anesthesia Evaluation  Patient identified by MRN, date of birth, ID band Patient awake    Reviewed: Allergy & Precautions, NPO status , Patient's Chart, lab work & pertinent test results  History of Anesthesia Complications Negative for: history of anesthetic complications  Airway Mallampati: II  TM Distance: >3 FB Neck ROM: Full    Dental  (+) Teeth Intact,    Pulmonary Current Smoker,    breath sounds clear to auscultation       Cardiovascular hypertension, Pt. on medications and Pt. on home beta blockers (-) angina(-) Past MI and (-) CHF  Rhythm:Regular     Neuro/Psych  Neuromuscular disease    GI/Hepatic negative GI ROS, Neg liver ROS,   Endo/Other  diabetes, Type 2, Insulin Dependent  Renal/GU CRFRenal disease     Musculoskeletal  (+) Arthritis ,   Abdominal   Peds  Hematology  (+) anemia ,   Anesthesia Other Findings   Reproductive/Obstetrics                             Anesthesia Physical Anesthesia Plan  ASA: III  Anesthesia Plan: General   Post-op Pain Management:    Induction: Intravenous  Airway Management Planned: LMA  Additional Equipment: None  Intra-op Plan:   Post-operative Plan: Extubation in OR  Informed Consent: I have reviewed the patients History and Physical, chart, labs and discussed the procedure including the risks, benefits and alternatives for the proposed anesthesia with the patient or authorized representative who has indicated his/her understanding and acceptance.   Dental advisory given  Plan Discussed with: Surgeon and CRNA  Anesthesia Plan Comments:         Anesthesia Quick Evaluation

## 2016-11-13 ENCOUNTER — Encounter (HOSPITAL_COMMUNITY): Payer: Self-pay | Admitting: Orthopedic Surgery

## 2016-11-13 DIAGNOSIS — M71051 Abscess of bursa, right hip: Secondary | ICD-10-CM

## 2016-11-13 DIAGNOSIS — A4902 Methicillin resistant Staphylococcus aureus infection, unspecified site: Secondary | ICD-10-CM

## 2016-11-13 DIAGNOSIS — M86072 Acute hematogenous osteomyelitis, left ankle and foot: Secondary | ICD-10-CM

## 2016-11-13 LAB — COMPREHENSIVE METABOLIC PANEL
ALT: 41 U/L (ref 17–63)
AST: 22 U/L (ref 15–41)
Albumin: 2.9 g/dL — ABNORMAL LOW (ref 3.5–5.0)
Alkaline Phosphatase: 210 U/L — ABNORMAL HIGH (ref 38–126)
Anion gap: 6 (ref 5–15)
BILIRUBIN TOTAL: 0.8 mg/dL (ref 0.3–1.2)
BUN: 10 mg/dL (ref 6–20)
CHLORIDE: 102 mmol/L (ref 101–111)
CO2: 28 mmol/L (ref 22–32)
CREATININE: 1.24 mg/dL (ref 0.61–1.24)
Calcium: 8.8 mg/dL — ABNORMAL LOW (ref 8.9–10.3)
GFR calc non Af Amer: >60 mL/min (ref >60)
Glucose, Bld: 182 mg/dL — ABNORMAL HIGH (ref 65–99)
POTASSIUM: 4.1 mmol/L (ref 3.5–5.1)
Sodium: 136 mmol/L (ref 135–145)
TOTAL PROTEIN: 6.6 g/dL (ref 6.5–8.1)

## 2016-11-13 LAB — BODY FLUID CULTURE

## 2016-11-13 LAB — GLUCOSE, CAPILLARY
GLUCOSE-CAPILLARY: 162 mg/dL — AB (ref 65–99)
GLUCOSE-CAPILLARY: 148 mg/dL — AB (ref 65–99)
GLUCOSE-CAPILLARY: 156 mg/dL — AB (ref 65–99)
Glucose-Capillary: 170 mg/dL — ABNORMAL HIGH (ref 65–99)
Glucose-Capillary: 183 mg/dL — ABNORMAL HIGH (ref 65–99)

## 2016-11-13 LAB — CBC
HEMATOCRIT: 33.7 % — AB (ref 39.0–52.0)
Hemoglobin: 10.8 g/dL — ABNORMAL LOW (ref 13.0–17.0)
MCH: 26.7 pg (ref 26.0–34.0)
MCHC: 32 g/dL (ref 30.0–36.0)
MCV: 83.4 fL (ref 78.0–100.0)
PLATELETS: 223 10*3/uL (ref 150–400)
RBC: 4.04 MIL/uL — AB (ref 4.22–5.81)
RDW: 13.4 % (ref 11.5–15.5)
WBC: 6.7 10*3/uL (ref 4.0–10.5)

## 2016-11-13 LAB — HEMOGLOBIN A1C
Hgb A1c MFr Bld: 12.1 % — ABNORMAL HIGH (ref 4.8–5.6)
MEAN PLASMA GLUCOSE: 301 mg/dL

## 2016-11-13 LAB — CK: CK TOTAL: 83 U/L (ref 49–397)

## 2016-11-13 NOTE — Progress Notes (Addendum)
Dr. Magnus Ivan notified re: order to pull penrose drain, MD stated not to pull the drain and that he will come by later to look on pt incision site, ok to changed dressing as needed per MD.

## 2016-11-13 NOTE — Clinical Social Work Note (Signed)
CSW received consult for SNF placement. Physical therapy note reviewed and NO PT follow-up recommened. Patient has no insurance. CSW signing off, however please reconsult if any other SW needs prior to discharge.  Genelle Bal, MSW, LCSW Licensed Clinical Social Worker Clinical Social Work Department [employer] [phone]

## 2016-11-13 NOTE — Care Management Note (Signed)
Case Management Note  Patient Details  Name: Ryan Shaw MRN: [id number] Date of Birth: May 25, 1966  Subjective/Objective:   CM following for progression and d/c planning.                  Action/Plan: 11/13/2016 Hopefully will be able to obtain override for medication assistance thru the [facility] program, contacted CM assist director S Marcelle Overlie re this need.  Will ask [facility] to provide [facility] , will not be able to get HHPT due to uninsured status.   Expected Discharge Date:                  Expected Discharge Plan:  Home w Home Health Services  In-House Referral:  Clinical Social Work  Discharge planning Services  CM Consult, Medication Assistance, Indigent Health Clinic  Post Acute Care Choice:  Durable Medical Equipment, Home Health Choice offered to:  NA  DME Arranged:    DME Agency:     HH Arranged:  RN HH Agency:  [facility]  Status of Service:  Completed, signed off  If discussed at [employer] of Stay Meetings, dates discussed:    Additional Comments:  Starlyn Skeans, RN 11/13/2016, 12:00 PM

## 2016-11-13 NOTE — Progress Notes (Signed)
Inpatient Diabetes Program Recommendations  AACE/ADA: New Consensus Statement on Inpatient Glycemic Control (2015)  Target Ranges:  Prepandial:   less than 140 mg/dL      Peak postprandial:   less than 180 mg/dL (1-2 hours)      Critically ill patients:  140 - 180 mg/dL   Lab Results  Component Value Date   GLUCAP 170 (H) 11/13/2016   HGBA1C 12.1 (H) 11/12/2016    Review of Glycemic Control  Spoke with pt regarding his diabetes management. Discussed HgbA1C and goals. Pt states he checks his blood sugars and they have been running high. Had f/u appt at [facility] last week, but was hospitalized. States his daughter cooks for him and he tries to eat healthy. Takes Levemir 50 units QHS, Novolog 30 units tidwc. Post-prandial blood sugars trending well right now.  Inpatient Diabetes Program Recommendations:    If post-prandial blood sugars > 180 mg/dL, add Novolog 10 units tidwc. F/U with [facility] Insulin is provided by [facility].  Continue to follow while inpatient.  Thank you. Ailene Ards, RD, LDN, CDE Inpatient Diabetes Coordinator [phone]

## 2016-11-13 NOTE — Evaluation (Signed)
Physical Therapy Evaluation Patient Details Name: Ryan Shaw MRN: [id number] DOB: 1966/07/24 Today's Date: 11/13/2016   History of Present Illness   Ryan Shaw is a 51 y.o. male with history of diabetes mellitus type 2, hypertension, hyperlipidemia presents to the ER with increasing pain over the amputation site of the left great toe. Patient had amputation about 6 weeks ago for infected toe. Patient had recently followed up with Dr. Lajoyce Shaw last week. Last 2 days patient has been experiencing increasing pain with subjective feeling of fever and chills.  pt s/p R hip abscess I and D with penrose.  Clinical Impression  Pt admitted with/for L leg/foot pain, but I and D for R hip.  Pt painful on both LE's and needing min guard at best for safety at this time..  Pt currently limited functionally due to the problems listed below.  (see problems list.)  Pt will benefit from PT to maximize function and safety to be able to get home safely with available assist of family.     Follow Up Recommendations No PT follow up    Equipment Recommendations  Other (comment);Rolling walker with 5" wheels (make sure pt has a RW)    Recommendations for Other Services       Precautions / Restrictions Precautions Precautions: Fall      Mobility  Bed Mobility Overal bed mobility: Modified Independent                Transfers Overall transfer level: Modified independent                  Ambulation/Gait Ambulation/Gait assistance: Min guard Ambulation Distance (Feet): 200 Feet (with iv pole and 25 feet with cane) Assistive device: Straight cane (iv pole) Gait Pattern/deviations: Step-through pattern   Gait velocity interpretation: Below normal speed for age/gender General Gait Details: antalgic with mild unsteadiness overall.  Pt more unsteady with cane, needing more practice before safe and independent  Stairs            Wheelchair Mobility    Modified Rankin (Stroke  Patients Only)       Balance Overall balance assessment: Needs assistance Sitting-balance support: No upper extremity supported Sitting balance-Leahy Scale: Good     Standing balance support: Single extremity supported;No upper extremity supported Standing balance-Leahy Scale: Fair                               Pertinent Vitals/Pain Pain Assessment: Faces Faces Pain Scale: Hurts even more Pain Location: R hip Pain Descriptors / Indicators: Grimacing;Guarding;Discomfort;Sore Pain Intervention(s): Monitored during session    Home Living Family/patient expects to be discharged to:: Private residence Living Arrangements: Children (daugter and her 4 children) Available Help at Discharge: Family;Available 24 hours/day Type of Home: House Home Access: Stairs to enter Entrance Stairs-Rails: None Entrance Stairs-Number of Steps: 1 Home Layout: One level Home Equipment: Environmental consultant - 2 wheels      Prior Function           Comments: 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     Higher education careers adviser   Dominant Hand: Right    Extremity/Trunk Assessment        Lower Extremity Assessment Lower Extremity Assessment: Overall WFL for tasks assessed       Communication   Communication: No difficulties  Cognition Arousal/Alertness: Awake/alert Behavior During Therapy: WFL for tasks assessed/performed Overall Cognitive Status: Within Functional Limits for tasks assessed  General Comments      Exercises     Assessment/Plan    PT Assessment Patient needs continued PT services  PT Problem List Decreased balance;Decreased mobility;Decreased knowledge of use of DME;Pain       PT Treatment Interventions DME instruction;Gait training;Stair training;Functional mobility training;Therapeutic activities;Balance training;Patient/family education    PT Goals (Current goals can be found in the Care Plan section)  Acute Rehab  PT Goals Patient Stated Goal: back independent PT Goal Formulation: With patient Time For Goal Achievement: 11/20/16 Potential to Achieve Goals: Good    Frequency Min 3X/week   Barriers to discharge Decreased caregiver support alone some of the day    Co-evaluation               End of Session   Activity Tolerance: Patient tolerated treatment well Patient left: in bed;with call bell/phone within reach;with bed alarm set;with family/visitor present Nurse Communication: Mobility status PT Visit Diagnosis: Unsteadiness on feet (R26.81);Other abnormalities of gait and mobility (R26.89)    Time: 4098-1191 PT Time Calculation (min) (ACUTE ONLY): 30 min   Charges:   PT Evaluation $PT Eval Moderate Complexity: 1 Procedure PT Treatments $Gait Training: 8-22 mins   PT G Codes:        11-29-2016  Ryan Shaw, PT [phone]  (pager)  Ryan Shaw 11/29/16, 5:21 PM

## 2016-11-13 NOTE — Progress Notes (Signed)
Subjective:  Hip pain with walking with PT   Antibiotics:  Anti-infectives    Start     Dose/Rate Route Frequency Ordered Stop   11/12/16 2300  ceFAZolin (ANCEF) IVPB 1 g/50 mL premix     1 g 100 mL/hr over 30 Minutes Intravenous Every 6 hours 11/12/16 1830 11/13/16 1129   11/12/16 2100  DAPTOmycin (CUBICIN) 900 mg in sodium chloride 0.9 % IVPB     900 mg 236 mL/hr over 30 Minutes Intravenous Every 24 hours 11/12/16 2035     11/12/16 1545  ceFAZolin (ANCEF) IVPB 2g/100 mL premix     2 g 200 mL/hr over 30 Minutes Intravenous  Once 11/12/16 0844 11/12/16 1717   11/12/16 0600  ceFAZolin (ANCEF) IVPB 2g/100 mL premix  Status:  Discontinued     2 g 200 mL/hr over 30 Minutes Intravenous On call to O.R. 11/11/16 2039 11/12/16 0844   11/11/16 1315  doxycycline (VIBRA-TABS) tablet 100 mg  Status:  Discontinued     100 mg Oral Every 12 hours 11/11/16 1310 11/11/16 1658   11/10/16 2200  piperacillin-tazobactam (ZOSYN) IVPB 3.375 g  Status:  Discontinued     3.375 g 12.5 mL/hr over 240 Minutes Intravenous Every 8 hours 11/10/16 1529 11/10/16 1539   11/10/16 1530  piperacillin-tazobactam (ZOSYN) IVPB 3.375 g  Status:  Discontinued     3.375 g 100 mL/hr over 30 Minutes Intravenous  Once 11/10/16 1528 11/10/16 1539   11/09/16 2245  linezolid (ZYVOX) IVPB 600 mg     600 mg 300 mL/hr over 60 Minutes Intravenous  Once 11/09/16 2230 11/09/16 2343   11/09/16 2100  piperacillin-tazobactam (ZOSYN) IVPB 3.375 g     3.375 g 100 mL/hr over 30 Minutes Intravenous  Once 11/09/16 2045 11/09/16 2236   11/09/16 2045  vancomycin (VANCOCIN) 2,000 mg in sodium chloride 0.9 % 500 mL IVPB  Status:  Discontinued     2,000 mg 250 mL/hr over 120 Minutes Intravenous  Once 11/09/16 2045 11/09/16 2229   11/09/16 1930  clindamycin (CLEOCIN) IVPB 600 mg     600 mg 100 mL/hr over 30 Minutes Intravenous  Once 11/09/16 1923 11/09/16 2027      Medications: Scheduled Meds: . amLODipine  10 mg Oral QHS  .  atorvastatin  40 mg Oral QHS  . Chlorhexidine Gluconate Cloth  6 each Topical Q0600  . DAPTOmycin (CUBICIN)  IV  900 mg Intravenous Q24H  . docusate sodium  100 mg Oral BID  . feeding supplement (GLUCERNA SHAKE)  237 mL Oral TID BM  . insulin aspart  0-9 Units Subcutaneous TID WC  . insulin detemir  50 Units Subcutaneous QHS  . lisinopril  5 mg Oral QHS  . metoprolol tartrate  25 mg Oral BID  . mupirocin ointment  1 application Nasal BID  . pregabalin  100 mg Oral BID   Continuous Infusions: . sodium chloride 10 mL/hr at 11/12/16 1856  . lactated ringers Stopped (11/12/16 1746)   PRN Meds:.acetaminophen **OR** acetaminophen, bisacodyl, HYDROmorphone (DILAUDID) injection, insulin aspart, magnesium citrate, methocarbamol **OR** methocarbamol (ROBAXIN)  IV, metoCLOPramide **OR** metoCLOPramide (REGLAN) injection, ondansetron **OR** ondansetron (ZOFRAN) IV, oxyCODONE, polyethylene glycol    Objective: Weight change: -12.8 oz (-0.363 kg)  Intake/Output Summary (Last 24 hours) at 11/13/16 2013 Last data filed at 11/13/16 1807  Gross per 24 hour  Intake          1348.67 ml  Output  0 ml  Net          1348.67 ml   Blood pressure 140/69, pulse 96, temperature (!) 101.3 F (38.5 C), temperature source Oral, resp. rate 16, height  (1.93 m), weight 244 lb 4.8 oz (110.8 kg), SpO2 98 %. Temp:  [98.7 F (37.1 C)-101.3 F (38.5 C)] 101.3 F (38.5 C) (04/13 1726) Pulse Rate:  [84-104] 96 (04/13 1726) Resp:  [16-18] 16 (04/13 1726) BP: (109-140)/(57-74) 140/69 (04/13 1726) SpO2:  [98 %-100 %] 98 % (04/13 1726) Weight:  [244 lb 4.8 oz (110.8 kg)] 244 lb 4.8 oz (110.8 kg) (04/12 2114)  Physical Exam:  General: Alert and awake, oriented x3, not in any acute distress walking about with PT  11/11/16:amputation site      Neuro: nonfocal    CBC:  CBC Latest Ref Rng & Units 11/13/2016 11/12/2016 11/11/2016  WBC 4.0 - 10.5 K/uL 6.7 8.3 7.7  Hemoglobin 13.0 - 17.0  g/dL 10.8(L) 11.0(L) 10.1(L)  Hematocrit 39.0 - 52.0 % 33.7(L) 34.2(L) 31.8(L)  Platelets 150 - 400 K/uL 223 193 152    BMET  Recent Labs  11/12/16 0429 11/13/16 0501  NA 140 136  K 3.8 4.1  CL 108 102  CO2 27 28  GLUCOSE 107* 182*  BUN 9 10  CREATININE 1.13 1.24  CALCIUM 9.2 8.8*     Liver Panel   Recent Labs  11/12/16 0429 11/13/16 0501  PROT 6.7 6.6  ALBUMIN 3.1* 2.9*  AST 23 22  ALT 54 41  ALKPHOS 221* 210*  BILITOT 0.6 0.8       Sedimentation Rate  Recent Labs  11/12/16 0429  ESRSEDRATE 57*   C-Reactive Protein  Recent Labs  11/12/16 0429  CRP 5.7*    Micro Results: Recent Results (from the past 720 hour(s))  Culture, blood (Routine x 2)     Status: None (Preliminary result)   Collection Time: 11/09/16  7:19 PM  Result Value Ref Range Status   Specimen Description BLOOD RIGHT ANTECUBITAL  Final   Special Requests   Final    BOTTLES DRAWN AEROBIC AND ANAEROBIC Blood Culture adequate volume   Culture NO GROWTH 4 DAYS  Final   Report Status PENDING  Incomplete  Culture, blood (Routine x 2)     Status: None (Preliminary result)   Collection Time: 11/09/16  7:19 PM  Result Value Ref Range Status   Specimen Description BLOOD LEFT ANTECUBITAL  Final   Special Requests   Final    BOTTLES DRAWN AEROBIC ONLY Blood Culture adequate volume   Culture NO GROWTH 4 DAYS  Final   Report Status PENDING  Incomplete  Body fluid culture     Status: None   Collection Time: 11/11/16  2:26 PM  Result Value Ref Range Status   Specimen Description FLUID RIGHT HIP  Final   Special Requests JOINT HIP SWAB  Final   Gram Stain   Final    MODERATE WBC PRESENT, PREDOMINANTLY PMN RARE GRAM POSITIVE COCCI IN CLUSTERS IN PAIRS    Culture   Final    MODERATE METHICILLIN RESISTANT STAPHYLOCOCCUS AUREUS   Report Status 11/13/2016 FINAL  Final   Organism ID, Bacteria METHICILLIN RESISTANT STAPHYLOCOCCUS AUREUS  Final      Susceptibility   Methicillin resistant  staphylococcus aureus - MIC*    CIPROFLOXACIN >=8 RESISTANT Resistant     ERYTHROMYCIN >=8 RESISTANT Resistant     GENTAMICIN <=0.5 SENSITIVE Sensitive     OXACILLIN >=4 RESISTANT Resistant  TETRACYCLINE <=1 SENSITIVE Sensitive     VANCOMYCIN 1 SENSITIVE Sensitive     TRIMETH/SULFA <=10 SENSITIVE Sensitive     CLINDAMYCIN >=8 RESISTANT Resistant     RIFAMPIN <=0.5 SENSITIVE Sensitive     Inducible Clindamycin NEGATIVE Sensitive     * MODERATE METHICILLIN RESISTANT STAPHYLOCOCCUS AUREUS  Surgical pcr screen     Status: Abnormal   Collection Time: 11/11/16  8:53 PM  Result Value Ref Range Status   MRSA, PCR POSITIVE (A) NEGATIVE Final    Comment: RESULT CALLED TO, READ BACK BY AND VERIFIED WITH: C BOKIAGON,RN  11/11/16 MKELLY,MLT    Staphylococcus aureus POSITIVE (A) NEGATIVE Final    Comment:        The Xpert SA Assay (FDA approved for NASAL specimens in patients over 44 years of age), is one component of a comprehensive surveillance program.  Test performance has been validated by [facility] for patients greater than or equal to 97 year old. It is not intended to diagnose infection nor to guide or monitor treatment.   Aerobic Culture (superficial specimen)     Status: None (Preliminary result)   Collection Time: 11/12/16  5:33 PM  Result Value Ref Range Status   Specimen Description WOUND RIGHT HIP  Final   Special Requests PATIENT ON FOLLOWING ANCEF  Final   Gram Stain   Final    ABUNDANT WBC PRESENT,BOTH PMN AND MONONUCLEAR FEW GRAM POSITIVE COCCI IN CLUSTERS    Culture   Final    MODERATE STAPHYLOCOCCUS AUREUS SUSCEPTIBILITIES TO FOLLOW    Report Status PENDING  Incomplete    Studies/Results: No results found.    Assessment/Plan:  INTERVAL HISTORY: patient has had surgery  Cultures from aspirate with MRSA  Principal Problem:   Osteomyelitis of left foot (HCC) Active Problems:   Type 2 diabetes mellitus with diabetic neuropathy, with long-term  current use of insulin (HCC)   Essential hypertension   CKD (chronic kidney disease), stage III   Osteomyelitis (HCC)   Idiopathic chronic gout of left ankle without tophus   Cellulitis of left lower extremity   Fever   Right hip pain   Septic hip (HCC)   Acute right ankle pain   Abscess of bursa of right hip    Ryan Shaw is a 51 y.o. male with  with  Amputation of part of great toe for osteomyelitis now with admission with subjective fevers ankle pain and MRI suggestive of osteomyelitis. Dr. Lajoyce Corners feels the imaging is not convincing for osteomyelitis in the toe. Since I saw the patient the patient apparently had also developed hip pain and Dr. Lajoyce Corners was able to aspirate some bloody material from the hip and is planning on taking to the upper room tomorrow.  #1 Infected hip: DOES THIS EXTEND INTO THE JOINT OR NEAR BONE? Or is this confiined to the soft tissues?  Aspirate is growing MRSA and deep cultures look to be the same organism though should follow these.  Once we get clarity re the hip joint and bone were not involved we could change to oral antibiotics such as doxycycline  bid  IF it involves joint or bone then would go with IV dapto vs Teflaro given his problems with renal failure with vancomycin  #2 ? Osteo of toe: Dr. Lajoyce Corners is skeptical of this. Would follow closely  Duration of treatment will depend upon how deep infection is. If arthritis or bone involvement been 6-8 weeks of parenteral therapy if only soft tissue then he  can leave the hospital with 2 weeks of oral antibiotics.  I'll be on call over the weekend and available for questions this last issue of far the infection extends needs to be clarified with Dr. Lajoyce Corners.  I will also arrange for HSFU with pt in our clinic.    LOS: 4 days   Acey Lav 11/13/2016, 8:13 PM

## 2016-11-13 NOTE — Progress Notes (Signed)
PROGRESS NOTE  Ryan Shaw  OZH:[id number] DOB: 05/20/1966 DOA: 11/09/2016 PCP: Jaclyn Shaggy, MD  Brief Narrative:   Ryan Shaw is a 51 y.o. male with history of diabetes mellitus type 2, hypertension, hyperlipidemia presents to the ER with increasing pain over the amputation site of the left great toe. Patient had amputation about 6 weeks ago for infected toe. Patient had recently followed up with Dr. Lajoyce Corners last week at which time he was feeling well.  Since that appointment, he has had increasing redness, swelling and pain in the left foot, pain in tips of toes and also near ankles.  Last 2 days he developed fever and chills.  X-rays of the left foot showed collapse of the subchondral bone of the left foot amputation site. MRI also concerning for early distal 1st metatarsal osteomyelitis.  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  ABIs were normal and he has good pedal pulse.  Dr. Lajoyce Corners has been notified for biopsy vs. Amputation.  He felt it was likely gout and less likely infection.  Pt developed in the interim a right hip abscess and to OR for I &D 4/12 Dr. Lajoyce Corners.  ID following.  Assessment & Plan:   Principal Problem:   Osteomyelitis of left foot (HCC) Active Problems:   Type 2 diabetes mellitus with diabetic neuropathy, with long-term current use of insulin (HCC)   Essential hypertension   CKD (chronic kidney disease), stage III   Osteomyelitis (HCC)   Idiopathic chronic gout of left ankle without tophus   Cellulitis of left lower extremity   Fever   Right hip pain   Septic hip (HCC)   Acute right ankle pain   Abscess of bursa of right hip  Left foot pain likely from gout per orthopedics -  Blood cultures NGTD -  Dr. Lajoyce Corners consulted -  ABIs normal -  Recent HIV NR  Diabetes mellitus type 2, uncontrolled, CBG well controlled since admission -  A1c 13.5 on 10/08/2016 CBG (last 3)   Recent  Labs  11/12/16 2112 11/13/16 0536 11/13/16 0804  GLUCAP 234* 162* 148*   Hypertension, blood pressures stable -  Continue lisinopril and norvasc  Abscess right hip - I called and asked orthopedist Dr. Lajoyce Corners to eval and taking him to OR 4/12 for I&D. Bedside cultures done 4/11: MRSA positive, Pt is MRSA positive. ID started on daptomycin 4/12.   Hyperlipidemia, stable -  Continue statin  Tobacco abuse, advised to quit smoking  Mildly elevated LFTs, suspect fatty liver and dehydration.  Bili is normal -  acute hepatitis panel negative  DVT prophylaxis:  SCDs Code Status:  Full  Family Communication:  Patient Disposition Plan:  TBD  Consultants:   Dr. Lajoyce Corners  Infectious disease  Procedures:   ABIs MRI left foot  Antimicrobials:  Anti-infectives    Start     Dose/Rate Route Frequency Ordered Stop   11/12/16 2300  ceFAZolin (ANCEF) IVPB 1 g/50 mL premix     1 g 100 mL/hr over 30 Minutes Intravenous Every 6 hours 11/12/16 1830 11/13/16 1129   11/12/16 2100  DAPTOmycin (CUBICIN) 900 mg in sodium chloride 0.9 % IVPB     900 mg 236 mL/hr over 30 Minutes Intravenous Every 24 hours 11/12/16 2035     11/12/16 1545  ceFAZolin (ANCEF) IVPB 2g/100 mL premix     2 g 200 mL/hr over 30 Minutes Intravenous  Once  11/12/16 0844 11/12/16 1717   11/12/16 0600  ceFAZolin (ANCEF) IVPB 2g/100 mL premix  Status:  Discontinued     2 g 200 mL/hr over 30 Minutes Intravenous On call to O.R. 11/11/16 2039 11/12/16 0844   11/11/16 1315  doxycycline (VIBRA-TABS) tablet 100 mg  Status:  Discontinued     100 mg Oral Every 12 hours 11/11/16 1310 11/11/16 1658   11/10/16 2200  piperacillin-tazobactam (ZOSYN) IVPB 3.375 g  Status:  Discontinued     3.375 g 12.5 mL/hr over 240 Minutes Intravenous Every 8 hours 11/10/16 1529 11/10/16 1539   11/10/16 1530  piperacillin-tazobactam (ZOSYN) IVPB 3.375 g  Status:  Discontinued     3.375 g 100 mL/hr over 30 Minutes Intravenous  Once 11/10/16 1528 11/10/16  1539   11/09/16 2245  linezolid (ZYVOX) IVPB 600 mg     600 mg 300 mL/hr over 60 Minutes Intravenous  Once 11/09/16 2230 11/09/16 2343   11/09/16 2100  piperacillin-tazobactam (ZOSYN) IVPB 3.375 g     3.375 g 100 mL/hr over 30 Minutes Intravenous  Once 11/09/16 2045 11/09/16 2236   11/09/16 2045  vancomycin (VANCOCIN) 2,000 mg in sodium chloride 0.9 % 500 mL IVPB  Status:  Discontinued     2,000 mg 250 mL/hr over 120 Minutes Intravenous  Once 11/09/16 2045 11/09/16 2229   11/09/16 1930  clindamycin (CLEOCIN) IVPB 600 mg     600 mg 100 mL/hr over 30 Minutes Intravenous  Once 11/09/16 1923 11/09/16 2027     Subjective:  Pt says pain is better controlled this morning.      Objective: Vitals:   11/12/16 1849 11/12/16 2114 11/13/16 0539 11/13/16 0934  BP: (!) 108/55 125/74 115/65 (!) 109/57  Pulse: 79 90 84 (!) 104  Resp: Temp: 98.7 F (37.1 C) 99.1 F (37.3 C) 98.9 F (37.2 C) 98.7 F (37.1 C)  TempSrc: Oral Oral Oral Oral  SpO2: 97% 100% 100% 100%  Weight:  110.8 kg (244 lb 4.8 oz)    Height:        Intake/Output Summary (Last 24 hours) at 11/13/16 1133 Last data filed at 11/13/16 5621  Gross per 24 hour  Intake          1708.67 ml  Output              205 ml  Net          1503.67 ml   Filed Weights   11/10/16 2055 11/11/16 2017 11/12/16 2114  Weight: 115.3 kg (254 lb 3.2 oz) 111.2 kg (245 lb 1.6 oz) 110.8 kg (244 lb 4.8 oz)    Examination:  General exam:  Adult male.  No acute distress.  HEENT:  NCAT, MMM Respiratory system: Clear to auscultation bilaterally Cardiovascular system: Regular rate and rhythm, normal S1/S2. No murmurs, rubs, gallops or clicks.  Warm extremities Gastrointestinal system: Normal active bowel sounds, soft, nondistended, nontender. MSK:  Normal tone and bulk, left foot with 1st toe amputation. Wound CDI. Eschar overlies previous surgical scar area.  Nontender to palpation, no overlying erythema.  Skin is somewhat indurated and  surrounding tissues are swollen.  Whole foot has 1+ pitting edema and there is a 5mm ulcer on the dorsum of the left foot with surrounding erythema.   Neuro:  Grossly intact.   Data Reviewed: I have personally reviewed following labs and imaging studies  CBC:  Recent Labs Lab 11/09/16 1912 11/10/16 3086 11/11/16 0428 11/12/16 0429 11/13/16 0501  WBC 10.7* 9.3 7.7 8.3 6.7  NEUTROABS 8.2*  --   --   --   --   HGB 11.6* 11.3* 10.1* 11.0* 10.8*  HCT 35.7* 35.2* 31.8* 34.2* 33.7*  MCV 81.7 82.2 82.8 82.8 83.4  PLT 179 166 152 193 223   Basic Metabolic Panel:  Recent Labs Lab 11/09/16 1912 11/10/16 0558 11/11/16 0428 11/12/16 0429 11/13/16 0501  NA 138 139 140 140 136  K 3.6 3.4* 3.6 3.8 4.1  CL 104 106 109 108 102  CO2 GLUCOSE 135* 155* 115* 107* 182*  BUN CREATININE 1.20 1.17 0.99 1.13 1.24  CALCIUM 9.0 8.5* 8.5* 9.2 8.8*   GFR: Estimated Creatinine Clearance: 97.2 mL/min (by C-G formula based on SCr of 1.24 mg/dL). Liver Function Tests:  Recent Labs Lab 11/09/16 1912 11/11/16 0428 11/12/16 0429 11/13/16 0501  AST 78* ALT 119* 66* 54 41  ALKPHOS 265* 209* 221* 210*  BILITOT 0.7 0.6 0.6 0.8  PROT 6.9 5.9* 6.7 6.6  ALBUMIN 3.7 2.8* 3.1* 2.9*   No results for input(s): LIPASE, AMYLASE in the last 168 hours. No results for input(s): AMMONIA in the last 168 hours. Coagulation Profile:  Recent Labs Lab 11/09/16 1912  INR 1.07   Cardiac Enzymes:  Recent Labs Lab 11/13/16 0501  CKTOTAL 83   BNP (last 3 results) No results for input(s): PROBNP in the last 8760 hours. HbA1C:  Recent Labs  11/12/16 0429  HGBA1C 12.1*   CBG:  Recent Labs Lab 11/12/16 1754 11/12/16 1844 11/12/16 2112 11/13/16 0536 11/13/16 0804  GLUCAP 99 127* 234* 162* 148*   Lipid Profile: No results for input(s): CHOL, HDL, LDLCALC, TRIG, CHOLHDL, LDLDIRECT in the last 72 hours. Thyroid Function Tests: No results for input(s):  TSH, T4TOTAL, FREET4, T3FREE, THYROIDAB in the last 72 hours. Anemia Panel: No results for input(s): VITAMINB12, FOLATE, FERRITIN, TIBC, IRON, RETICCTPCT in the last 72 hours. Urine analysis:    Component Value Date/Time   COLORURINE YELLOW 11/10/2016 0127   APPEARANCEUR CLEAR 11/10/2016 0127   LABSPEC 1.018 11/10/2016 0127   PHURINE 5.0 11/10/2016 0127   GLUCOSEU NEGATIVE 11/10/2016 0127   HGBUR NEGATIVE 11/10/2016 0127   BILIRUBINUR NEGATIVE 11/10/2016 0127   BILIRUBINUR neg 10/24/2014 1152   KETONESUR NEGATIVE 11/10/2016 0127   PROTEINUR NEGATIVE 11/10/2016 0127   UROBILINOGEN 0.2 03/30/2015 2059   NITRITE NEGATIVE 11/10/2016 0127   LEUKOCYTESUR NEGATIVE 11/10/2016 0127    Recent Results (from the past 240 hour(s))  Culture, blood (Routine x 2)     Status: None (Preliminary result)   Collection Time: 11/09/16  7:19 PM  Result Value Ref Range Status   Specimen Description BLOOD RIGHT ANTECUBITAL  Final   Special Requests   Final    BOTTLES DRAWN AEROBIC AND ANAEROBIC Blood Culture adequate volume   Culture NO GROWTH 3 DAYS  Final   Report Status PENDING  Incomplete  Culture, blood (Routine x 2)     Status: None (Preliminary result)   Collection Time: 11/09/16  7:19 PM  Result Value Ref Range Status   Specimen Description BLOOD LEFT ANTECUBITAL  Final   Special Requests   Final    BOTTLES DRAWN AEROBIC ONLY Blood Culture adequate volume   Culture NO GROWTH 3 DAYS  Final   Report Status PENDING  Incomplete  Body fluid culture     Status: None   Collection Time: 11/11/16  2:26 PM  Result Value Ref Range Status   Specimen Description FLUID RIGHT HIP  Final   Special Requests JOINT HIP SWAB  Final   Gram Stain   Final    MODERATE WBC PRESENT, PREDOMINANTLY PMN RARE GRAM POSITIVE COCCI IN CLUSTERS IN PAIRS    Culture   Final    MODERATE METHICILLIN RESISTANT STAPHYLOCOCCUS AUREUS   Report Status 11/13/2016 FINAL  Final   Organism ID, Bacteria METHICILLIN RESISTANT  STAPHYLOCOCCUS AUREUS  Final      Susceptibility   Methicillin resistant staphylococcus aureus - MIC*    CIPROFLOXACIN >=8 RESISTANT Resistant     ERYTHROMYCIN >=8 RESISTANT Resistant     GENTAMICIN <=0.5 SENSITIVE Sensitive     OXACILLIN >=4 RESISTANT Resistant     TETRACYCLINE <=1 SENSITIVE Sensitive     VANCOMYCIN 1 SENSITIVE Sensitive     TRIMETH/SULFA <=10 SENSITIVE Sensitive     CLINDAMYCIN >=8 RESISTANT Resistant     RIFAMPIN <=0.5 SENSITIVE Sensitive     Inducible Clindamycin NEGATIVE Sensitive     * MODERATE METHICILLIN RESISTANT STAPHYLOCOCCUS AUREUS  Surgical pcr screen     Status: Abnormal   Collection Time: 11/11/16  8:53 PM  Result Value Ref Range Status   MRSA, PCR POSITIVE (A) NEGATIVE Final    Comment: RESULT CALLED TO, READ BACK BY AND VERIFIED WITH: C BOKIAGON,RN  11/11/16 MKELLY,MLT    Staphylococcus aureus POSITIVE (A) NEGATIVE Final    Comment:        The Xpert SA Assay (FDA approved for NASAL specimens in patients over 29 years of age), is one component of a comprehensive surveillance program.  Test performance has been validated by [facility] for patients greater than or equal to 89 year old. It is not intended to diagnose infection nor to guide or monitor treatment.   Aerobic Culture (superficial specimen)     Status: None (Preliminary result)   Collection Time: 11/12/16  5:33 PM  Result Value Ref Range Status   Specimen Description WOUND RIGHT HIP  Final   Special Requests PATIENT ON FOLLOWING ANCEF  Final   Gram Stain   Final    ABUNDANT WBC PRESENT,BOTH PMN AND MONONUCLEAR FEW GRAM POSITIVE COCCI IN CLUSTERS    Culture PENDING  Incomplete   Report Status PENDING  Incomplete    Radiology Studies: No results found. Scheduled Meds: . amLODipine  10 mg Oral QHS  . atorvastatin  40 mg Oral QHS  . Chlorhexidine Gluconate Cloth  6 each Topical Q0600  . DAPTOmycin (CUBICIN)  IV  900 mg Intravenous Q24H  . docusate sodium  100 mg Oral  BID  . feeding supplement (GLUCERNA SHAKE)  237 mL Oral TID BM  . insulin aspart  0-9 Units Subcutaneous TID WC  . insulin detemir  50 Units Subcutaneous QHS  . lisinopril  5 mg Oral QHS  . metoprolol tartrate  25 mg Oral BID  . mupirocin ointment  1 application Nasal BID  . pregabalin  100 mg Oral BID   Continuous Infusions: . sodium chloride 10 mL/hr at 11/12/16 1856  . lactated ringers Stopped (11/12/16 1746)    LOS: 4 days   Time spent: 20 min  Standley Dakins, MD [facility] Pager [phone]  If 7PM-7AM, please contact night-coverage [email or web address] Password [facility] 11/13/2016, 11:33 AM

## 2016-11-14 DIAGNOSIS — A4902 Methicillin resistant Staphylococcus aureus infection, unspecified site: Secondary | ICD-10-CM

## 2016-11-14 LAB — COMPREHENSIVE METABOLIC PANEL
ALBUMIN: 2.9 g/dL — AB (ref 3.5–5.0)
ALT: 40 U/L (ref 17–63)
AST: 39 U/L (ref 15–41)
Alkaline Phosphatase: 287 U/L — ABNORMAL HIGH (ref 38–126)
Anion gap: 9 (ref 5–15)
BUN: 10 mg/dL (ref 6–20)
CHLORIDE: 99 mmol/L — AB (ref 101–111)
CO2: 26 mmol/L (ref 22–32)
CREATININE: 1.23 mg/dL (ref 0.61–1.24)
Calcium: 9 mg/dL (ref 8.9–10.3)
GFR calc non Af Amer: >60 mL/min (ref >60)
GLUCOSE: 206 mg/dL — AB (ref 65–99)
Potassium: 4 mmol/L (ref 3.5–5.1)
Sodium: 134 mmol/L — ABNORMAL LOW (ref 135–145)
Total Bilirubin: 0.6 mg/dL (ref 0.3–1.2)
Total Protein: 6.8 g/dL (ref 6.5–8.1)

## 2016-11-14 LAB — CULTURE, BLOOD (ROUTINE X 2)
CULTURE: NO GROWTH
Culture: NO GROWTH
SPECIAL REQUESTS: ADEQUATE
SPECIAL REQUESTS: ADEQUATE

## 2016-11-14 LAB — GLUCOSE, CAPILLARY
Glucose-Capillary: 179 mg/dL — ABNORMAL HIGH (ref 65–99)
Glucose-Capillary: 215 mg/dL — ABNORMAL HIGH (ref 65–99)
Glucose-Capillary: 189 mg/dL — ABNORMAL HIGH (ref 65–99)
Glucose-Capillary: 199 mg/dL — ABNORMAL HIGH (ref 65–99)

## 2016-11-14 LAB — CBC
HCT: 32.7 % — ABNORMAL LOW (ref 39.0–52.0)
Hemoglobin: 10.6 g/dL — ABNORMAL LOW (ref 13.0–17.0)
MCH: 26.8 pg (ref 26.0–34.0)
MCHC: 32.4 g/dL (ref 30.0–36.0)
MCV: 82.6 fL (ref 78.0–100.0)
PLATELETS: 236 10*3/uL (ref 150–400)
RBC: 3.96 MIL/uL — AB (ref 4.22–5.81)
RDW: 13.1 % (ref 11.5–15.5)
WBC: 6.3 10*3/uL (ref 4.0–10.5)

## 2016-11-14 MED ORDER — INSULIN ASPART 100 UNIT/ML ~~LOC~~ SOLN
6.0000 [IU] | Freq: Three times a day (TID) | SUBCUTANEOUS | Status: DC
  Administered 2016-11-14 – 2016-11-15 (×2): 6 [IU] via SUBCUTANEOUS

## 2016-11-14 NOTE — Anesthesia Postprocedure Evaluation (Addendum)
Anesthesia Post Note  Patient: Ryan Shaw  Procedure(s) Performed: Procedure(s) (LRB): IRRIGATION AND DEBRIDEMENT RIGHT HIP (Right)  Patient location during evaluation: PACU Anesthesia Type: General Level of consciousness: awake and alert Pain management: pain level controlled Vital Signs Assessment: post-procedure vital signs reviewed and stable Respiratory status: spontaneous breathing, nonlabored ventilation, respiratory function stable and patient connected to nasal cannula oxygen Cardiovascular status: blood pressure returned to baseline and stable Postop Assessment: no signs of nausea or vomiting Anesthetic complications: no       Last Vitals:  Vitals:   11/13/16 2137 11/14/16 0611  BP: (!) 148/68 108/63  Pulse: (!) 102 98  Resp: 18 18  Temp: (!) 38.3 C (!) 38.9 C    Last Pain:  Vitals:   11/14/16 0611  TempSrc: Oral  PainSc:                  [name]

## 2016-11-14 NOTE — Progress Notes (Signed)
PROGRESS NOTE  Daric Koren  KVQ:[id number] DOB: 05/17/1966 DOA: 11/09/2016 PCP: Jaclyn Shaggy, MD  Brief Narrative:   Ryan Shaw is a 51 y.o. male with history of diabetes mellitus type 2, hypertension, hyperlipidemia presents to the ER with increasing pain over the amputation site of the left great toe. Patient had amputation about 6 weeks ago for infected toe. Patient had recently followed up with Dr. Lajoyce Corners last week at which time he was feeling well.  Since that appointment, he has had increasing redness, swelling and pain in the left foot, pain in tips of toes and also near ankles.  Last 2 days he developed fever and chills.  X-rays of the left foot showed collapse of the subchondral bone of the left foot amputation site. MRI also concerning for early distal 1st metatarsal osteomyelitis.  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  ABIs were normal and he has good pedal pulse.  Dr. Lajoyce Corners has been notified for biopsy vs. Amputation.  He felt it was likely gout and less likely infection.  Pt developed in the interim a right hip abscess and to OR for I &D 4/12 Dr. Lajoyce Corners.  ID following.  Assessment & Plan:   Principal Problem:   Osteomyelitis of left foot (HCC) Active Problems:   Type 2 diabetes mellitus with diabetic neuropathy, with long-term current use of insulin (HCC)   Essential hypertension   CKD (chronic kidney disease), stage III   Osteomyelitis (HCC)   Idiopathic chronic gout of left ankle without tophus   Cellulitis of left lower extremity   Fever   Right hip pain   Septic hip (HCC)   Acute right ankle pain   Abscess of bursa of right hip   MRSA infection  Left foot pain likely from gout per orthopedics -  Blood cultures NGTD -  Dr. Lajoyce Corners consulted -  ABIs normal -  Recent HIV NR  Diabetes mellitus type 2, uncontrolled, CBG well controlled since admission -  A1c 13.5 on 10/08/2016 CBG  (last 3)   Recent Labs  11/13/16 1729 11/13/16 2234 11/14/16 0736  GLUCAP 156* 183* 179*   Hypertension, blood pressures stable -  Continue lisinopril and norvasc  MRSA Abscess right hip - I called and asked orthopedist Dr. Lajoyce Corners to eval and taking him to OR 4/12 for I&D. Bedside cultures done 4/11: MRSA positive, Pt is MRSA positive. ID started on daptomycin 4/12.  Deep tissue cultures: MRSA   Hyperlipidemia, stable -  Continue statin  Tobacco abuse, advised to quit smoking  Mildly elevated LFTs, suspect fatty liver and dehydration.  Bili is normal -  acute hepatitis panel negative  DVT prophylaxis:  SCDs Code Status:  Full  Family Communication:  Patient Disposition Plan:  TBD  Consultants:   Dr. Lajoyce Corners  Infectious disease  Procedures:   ABIs MRI left foot  Antimicrobials:  Anti-infectives    Start     Dose/Rate Route Frequency Ordered Stop   11/12/16 2300  ceFAZolin (ANCEF) IVPB 1 g/50 mL premix     1 g 100 mL/hr over 30 Minutes Intravenous Every 6 hours 11/12/16 1830 11/13/16 1129   11/12/16 2100  DAPTOmycin (CUBICIN) 900 mg in sodium chloride 0.9 % IVPB     900 mg 236 mL/hr over 30 Minutes Intravenous Every 24 hours 11/12/16 2035     11/12/16 1545  ceFAZolin (ANCEF) IVPB 2g/100 mL premix  2 g 200 mL/hr over 30 Minutes Intravenous  Once 11/12/16 0844 11/12/16 1717   11/12/16 0600  ceFAZolin (ANCEF) IVPB 2g/100 mL premix  Status:  Discontinued     2 g 200 mL/hr over 30 Minutes Intravenous On call to O.R. 11/11/16 2039 11/12/16 0844   11/11/16 1315  doxycycline (VIBRA-TABS) tablet 100 mg  Status:  Discontinued     100 mg Oral Every 12 hours 11/11/16 1310 11/11/16 1658   11/10/16 2200  piperacillin-tazobactam (ZOSYN) IVPB 3.375 g  Status:  Discontinued     3.375 g 12.5 mL/hr over 240 Minutes Intravenous Every 8 hours 11/10/16 1529 11/10/16 1539   11/10/16 1530  piperacillin-tazobactam (ZOSYN) IVPB 3.375 g  Status:  Discontinued     3.375 g 100 mL/hr over  30 Minutes Intravenous  Once 11/10/16 1528 11/10/16 1539   11/09/16 2245  linezolid (ZYVOX) IVPB 600 mg     600 mg 300 mL/hr over 60 Minutes Intravenous  Once 11/09/16 2230 11/09/16 2343   11/09/16 2100  piperacillin-tazobactam (ZOSYN) IVPB 3.375 g     3.375 g 100 mL/hr over 30 Minutes Intravenous  Once 11/09/16 2045 11/09/16 2236   11/09/16 2045  vancomycin (VANCOCIN) 2,000 mg in sodium chloride 0.9 % 500 mL IVPB  Status:  Discontinued     2,000 mg 250 mL/hr over 120 Minutes Intravenous  Once 11/09/16 2045 11/09/16 2229   11/09/16 1930  clindamycin (CLEOCIN) IVPB 600 mg     600 mg 100 mL/hr over 30 Minutes Intravenous  Once 11/09/16 1923 11/09/16 2027     Subjective:  Pt without complaints this morning. Has been febrile.    Objective: Vitals:   11/13/16 1726 11/13/16 2137 11/14/16 0611 11/14/16 0937  BP: 140/69 (!) 148/68 108/63 104/75  Pulse: 96 (!) 102 98 93  Resp: Temp: (!) 101.3 F (38.5 C) (!) 100.9 F (38.3 C) (!) 102 F (38.9 C) 98.7 F (37.1 C)  TempSrc: Oral  Oral Oral  SpO2: 98% 96% 97% 96%  Weight:  111.3 kg (245 lb 6.4 oz)    Height:        Intake/Output Summary (Last 24 hours) at 11/14/16 1120 Last data filed at 11/14/16 0938  Gross per 24 hour  Intake              720 ml  Output             1700 ml  Net             -980 ml   Filed Weights   11/11/16 2017 11/12/16 2114 11/13/16 2137  Weight: 111.2 kg (245 lb 1.6 oz) 110.8 kg (244 lb 4.8 oz) 111.3 kg (245 lb 6.4 oz)    Examination:  General exam:  Adult male.  No acute distress.  HEENT:  NCAT, MMM Respiratory system: Clear to auscultation bilaterally Cardiovascular system: Regular rate and rhythm, normal S1/S2. No murmurs, rubs, gallops or clicks.  Warm extremities Gastrointestinal system: Normal active bowel sounds, soft, nondistended, nontender. MSK:  Normal tone and bulk, left foot with 1st toe amputation. Wound CDI. Eschar overlies previous surgical scar area.  Nontender to  palpation, no overlying erythema.  Skin is somewhat indurated and surrounding tissues are swollen.  Whole foot has 1+ pitting edema and there is a 5mm ulcer on the dorsum of the left foot with surrounding erythema.   Neuro:  Grossly intact.   Data Reviewed: I have personally reviewed following labs and imaging studies  CBC:  Recent Labs Lab 11/09/16 1912 11/10/16 0558 11/11/16 0428 11/12/16 0429 11/13/16 0501 11/14/16 0408  WBC 10.7* 9.3 7.7 8.3 6.7 6.3  NEUTROABS 8.2*  --   --   --   --   --   HGB 11.6* 11.3* 10.1* 11.0* 10.8* 10.6*  HCT 35.7* 35.2* 31.8* 34.2* 33.7* 32.7*  MCV 81.7 82.2 82.8 82.8 83.4 82.6  PLT 179 166 152 193 223 236   Basic Metabolic Panel:  Recent Labs Lab 11/10/16 0558 11/11/16 0428 11/12/16 0429 11/13/16 0501 11/14/16 0408  NA 139 140 140 136 134*  K 3.4* 3.6 3.8 4.1 4.0  CL 106 109 108 102 99*  CO2 24 24 27 28 26   GLUCOSE 155* 115* 107* 182* 206*  BUN 13 9 9 10 10   CREATININE 1.17 0.99 1.13 1.24 1.23  CALCIUM 8.5* 8.5* 9.2 8.8* 9.0   GFR: Estimated Creatinine Clearance: 98.2 mL/min (by C-G formula based on SCr of 1.23 mg/dL). Liver Function Tests:  Recent Labs Lab 11/09/16 1912 11/11/16 0428 11/12/16 0429 11/13/16 0501 11/14/16 0408  AST 78* 29 23 22  39  ALT 119* 66* 54 41 40  ALKPHOS 265* 209* 221* 210* 287*  BILITOT 0.7 0.6 0.6 0.8 0.6  PROT 6.9 5.9* 6.7 6.6 6.8  ALBUMIN 3.7 2.8* 3.1* 2.9* 2.9*   No results for input(s): LIPASE, AMYLASE in the last 168 hours. No results for input(s): AMMONIA in the last 168 hours. Coagulation Profile:  Recent Labs Lab 11/09/16 1912  INR 1.07   Cardiac Enzymes:  Recent Labs Lab 11/13/16 0501  CKTOTAL 83   BNP (last 3 results) No results for input(s): PROBNP in the last 8760 hours. HbA1C:  Recent Labs  11/12/16 0429  HGBA1C 12.1*   CBG:  Recent Labs Lab 11/13/16 0804 11/13/16 1225 11/13/16 1729 11/13/16 2234 11/14/16 0736  GLUCAP 148* 170* 156* 183* 179*   Lipid  Profile: No results for input(s): CHOL, HDL, LDLCALC, TRIG, CHOLHDL, LDLDIRECT in the last 72 hours. Thyroid Function Tests: No results for input(s): TSH, T4TOTAL, FREET4, T3FREE, THYROIDAB in the last 72 hours. Anemia Panel: No results for input(s): VITAMINB12, FOLATE, FERRITIN, TIBC, IRON, RETICCTPCT in the last 72 hours. Urine analysis:    Component Value Date/Time   COLORURINE YELLOW 11/10/2016 0127   APPEARANCEUR CLEAR 11/10/2016 0127   LABSPEC 1.018 11/10/2016 0127   PHURINE 5.0 11/10/2016 0127   GLUCOSEU NEGATIVE 11/10/2016 0127   HGBUR NEGATIVE 11/10/2016 0127   BILIRUBINUR NEGATIVE 11/10/2016 0127   BILIRUBINUR neg 10/24/2014 1152   KETONESUR NEGATIVE 11/10/2016 0127   PROTEINUR NEGATIVE 11/10/2016 0127   UROBILINOGEN 0.2 03/30/2015 2059   NITRITE NEGATIVE 11/10/2016 0127   LEUKOCYTESUR NEGATIVE 11/10/2016 0127    Recent Results (from the past 240 hour(s))  Culture, blood (Routine x 2)     Status: None (Preliminary result)   Collection Time: 11/09/16  7:19 PM  Result Value Ref Range Status   Specimen Description BLOOD RIGHT ANTECUBITAL  Final   Special Requests   Final    BOTTLES DRAWN AEROBIC AND ANAEROBIC Blood Culture adequate volume   Culture NO GROWTH 4 DAYS  Final   Report Status PENDING  Incomplete  Culture, blood (Routine x 2)     Status: None (Preliminary result)   Collection Time: 11/09/16  7:19 PM  Result Value Ref Range Status   Specimen Description BLOOD LEFT ANTECUBITAL  Final   Special Requests   Final    BOTTLES DRAWN AEROBIC ONLY Blood Culture adequate volume  Culture NO GROWTH 4 DAYS  Final   Report Status PENDING  Incomplete  Body fluid culture     Status: None   Collection Time: 11/11/16  2:26 PM  Result Value Ref Range Status   Specimen Description FLUID RIGHT HIP  Final   Special Requests JOINT HIP SWAB  Final   Gram Stain   Final    MODERATE WBC PRESENT, PREDOMINANTLY PMN RARE GRAM POSITIVE COCCI IN CLUSTERS IN PAIRS    Culture    Final    MODERATE METHICILLIN RESISTANT STAPHYLOCOCCUS AUREUS   Report Status 11/13/2016 FINAL  Final   Organism ID, Bacteria METHICILLIN RESISTANT STAPHYLOCOCCUS AUREUS  Final      Susceptibility   Methicillin resistant staphylococcus aureus - MIC*    CIPROFLOXACIN >=8 RESISTANT Resistant     ERYTHROMYCIN >=8 RESISTANT Resistant     GENTAMICIN <=0.5 SENSITIVE Sensitive     OXACILLIN >=4 RESISTANT Resistant     TETRACYCLINE <=1 SENSITIVE Sensitive     VANCOMYCIN 1 SENSITIVE Sensitive     TRIMETH/SULFA <=10 SENSITIVE Sensitive     CLINDAMYCIN >=8 RESISTANT Resistant     RIFAMPIN <=0.5 SENSITIVE Sensitive     Inducible Clindamycin NEGATIVE Sensitive     * MODERATE METHICILLIN RESISTANT STAPHYLOCOCCUS AUREUS  Surgical pcr screen     Status: Abnormal   Collection Time: 11/11/16  8:53 PM  Result Value Ref Range Status   MRSA, PCR POSITIVE (A) NEGATIVE Final    Comment: RESULT CALLED TO, READ BACK BY AND VERIFIED WITH: C BOKIAGON,RN  11/11/16 MKELLY,MLT    Staphylococcus aureus POSITIVE (A) NEGATIVE Final    Comment:        The Xpert SA Assay (FDA approved for NASAL specimens in patients over 33 years of age), is one component of a comprehensive surveillance program.  Test performance has been validated by [facility] for patients greater than or equal to 62 year old. It is not intended to diagnose infection nor to guide or monitor treatment.   Aerobic Culture (superficial specimen)     Status: None (Preliminary result)   Collection Time: 11/12/16  5:33 PM  Result Value Ref Range Status   Specimen Description WOUND RIGHT HIP  Final   Special Requests PATIENT ON FOLLOWING ANCEF  Final   Gram Stain   Final    ABUNDANT WBC PRESENT,BOTH PMN AND MONONUCLEAR FEW GRAM POSITIVE COCCI IN CLUSTERS    Culture   Final    MODERATE METHICILLIN RESISTANT STAPHYLOCOCCUS AUREUS   Report Status PENDING  Incomplete   Organism ID, Bacteria METHICILLIN RESISTANT STAPHYLOCOCCUS AUREUS   Final      Susceptibility   Methicillin resistant staphylococcus aureus - MIC*    CIPROFLOXACIN >=8 RESISTANT Resistant     ERYTHROMYCIN >=8 RESISTANT Resistant     GENTAMICIN <=0.5 SENSITIVE Sensitive     OXACILLIN >=4 RESISTANT Resistant     TETRACYCLINE >=16 RESISTANT Resistant     VANCOMYCIN <=0.5 SENSITIVE Sensitive     TRIMETH/SULFA <=10 SENSITIVE Sensitive     CLINDAMYCIN >=8 RESISTANT Resistant     RIFAMPIN <=0.5 SENSITIVE Sensitive     Inducible Clindamycin NEGATIVE Sensitive     * MODERATE METHICILLIN RESISTANT STAPHYLOCOCCUS AUREUS    Radiology Studies: No results found. Scheduled Meds: . amLODipine  10 mg Oral QHS  . atorvastatin  40 mg Oral QHS  . Chlorhexidine Gluconate Cloth  6 each Topical Q0600  . DAPTOmycin (CUBICIN)  IV  900 mg Intravenous Q24H  . docusate sodium  100 mg Oral BID  . feeding supplement (GLUCERNA SHAKE)  237 mL Oral TID BM  . insulin aspart  0-9 Units Subcutaneous TID WC  . insulin detemir  50 Units Subcutaneous QHS  . lisinopril  5 mg Oral QHS  . metoprolol tartrate  25 mg Oral BID  . mupirocin ointment  1 application Nasal BID  . pregabalin  100 mg Oral BID   Continuous Infusions: . sodium chloride 10 mL/hr at 11/12/16 1856  . lactated ringers Stopped (11/12/16 1746)    LOS: 5 days   Time spent: 20 min  Standley Dakins, MD [facility] Pager [phone]  If 7PM-7AM, please contact night-coverage [email or web address] Password [email or web address] 11/14/2016, 11:20 AM

## 2016-11-14 NOTE — Progress Notes (Signed)
Physical Therapy Treatment Patient Details Name: Ryan Shaw MRN: [id number] DOB: 04-04-1966 Today's Date: 11/14/2016    History of Present Illness  Ryan Shaw is a 51 y.o. male with history of diabetes mellitus type 2, hypertension, hyperlipidemia presents to the ER with increasing pain over the amputation site of the left great toe. Patient had amputation about 6 weeks ago for infected toe. Patient had recently followed up with Dr. Lajoyce Corners last week. Last 2 days patient has been experiencing increasing pain with subjective feeling of fever and chills.  pt s/p R hip abscess I and D with penrose.    PT Comments    Patient with increased pain today, limiting mobility.  After 64', patient required assist to maintain standing, and chair to return to room.  RN notified of pain level.     Follow Up Recommendations  No PT follow up;Supervision - Intermittent     Equipment Recommendations  Cane (Patient has RW)    Recommendations for Other Services       Precautions / Restrictions Precautions Precautions: Fall Restrictions Weight Bearing Restrictions: No    Mobility  Bed Mobility Overal bed mobility: Modified Independent                Transfers Overall transfer level: Needs assistance Equipment used: Rolling walker (2 wheeled) Transfers: Sit to/from [employer] Sit to Stand: Modified independent (Device/Increase time) Stand pivot transfers: Min assist       General transfer comment: Patient required increased time to move to standing.  At end of session, patient with increased pain and LE weakness.  Returned to room via transport chair.  Patient transferred chair > bed with min assist to steady.  Ambulation/Gait Ambulation/Gait assistance: Min guard;Max assist Ambulation Distance (Feet): 64 Feet Assistive device: Rolling walker (2 wheeled) Gait Pattern/deviations: Step-through pattern;Decreased step length - right;Decreased step length -  left;Decreased stride length;Shuffle;Antalgic;Trunk flexed Gait velocity: decreased Gait velocity interpretation: Below normal speed for age/gender General Gait Details: Patient with unsteady gait with flexed posture.  Antalgic with stance on BLE's, and with swing phase of RLE.  Note Lt knee instability during stance.  Patient with pain increasing to 10/10 in Rt hip and Lt foot/ankle, with burning on sole of Lt foot.  At 8', patient leaning forward on RW and knees buckled.  PT held patient until chair provided by NT.  Reported to RN patient's increased pain level.   Stairs            Wheelchair Mobility    Modified Rankin (Stroke Patients Only)       Balance           Standing balance support: Single extremity supported;During functional activity Standing balance-Leahy Scale: Poor                              Cognition Arousal/Alertness: Awake/alert Behavior During Therapy: WFL for tasks assessed/performed Overall Cognitive Status: Within Functional Limits for tasks assessed                                        Exercises      General Comments        Pertinent Vitals/Pain Pain Assessment: 0-10 Pain Score: 10-Worst pain ever Pain Location: Rt hip and Lt foot/ankle Pain Descriptors / Indicators: Aching;Burning;Grimacing;Sharp;Sore Pain Intervention(s): Limited activity within patient's tolerance;Monitored during session;Repositioned;Patient requesting  pain meds-RN notified    Home Living                      Prior Function            PT Goals (current goals can now be found in the care plan section) Acute Rehab PT Goals Patient Stated Goal: back independent Progress towards PT goals: Not progressing toward goals - comment (Increased pain limiting mobility)    Frequency    Min 3X/week      PT Plan Current plan remains appropriate    Co-evaluation             End of Session Equipment Utilized During  Treatment: Gait belt Activity Tolerance: Patient limited by pain Patient left: in bed;with call bell/phone within reach Nurse Communication: Mobility status;Patient requests pain meds PT Visit Diagnosis: Unsteadiness on feet (R26.81);Other abnormalities of gait and mobility (R26.89);Muscle weakness (generalized) (M62.81);Pain Pain - Right/Left:  (Rt hip and Lt foot) Pain - part of body: Hip;Ankle and joints of foot     Time: 1610-9604 PT Time Calculation (min) (ACUTE ONLY): 23 min  Charges:  $Gait Training: 23-37 mins                    G Codes:       Durenda Hurt. Renaldo Fiddler, [facility] Acute Rehab Services Pager [phone]    Vena Austria 11/14/2016, 7:58 PM

## 2016-11-14 NOTE — Progress Notes (Signed)
Subjective: 2 Days Post-Op Procedure(s) (LRB): IRRIGATION AND DEBRIDEMENT RIGHT HIP (Right) Patient reports pain as moderate.  Denies any nausea or vomiting. Tolerating diet well.   Objective: Vital signs in last 24 hours: Temp:  [98.7 F (37.1 C)-102 F (38.9 C)] 102 F (38.9 C) (04/14 0611) Pulse Rate:  [96-104] 98 (04/14 0611) Resp:  [16-18] 18 (04/14 0611) BP: (108-148)/(57-69) 108/63 (04/14 0611) SpO2:  [96 %-100 %] 97 % (04/14 0611) Weight:  [245 lb 6.4 oz (111.3 kg)] 245 lb 6.4 oz (111.3 kg) (04/13 2137)  Intake/Output from previous day: 04/13 0701 - 04/14 0700 In: 720 [P.O.:720] Out: 1400 [Urine:1400] Intake/Output this shift: No intake/output data recorded.   Recent Labs  11/12/16 0429 11/13/16 0501 11/14/16 0408  HGB 11.0* 10.8* 10.6*    Recent Labs  11/13/16 0501 11/14/16 0408  WBC 6.7 6.3  RBC 4.04* 3.96*  HCT 33.7* 32.7*  PLT 223 236    Recent Labs  11/13/16 0501 11/14/16 0408  NA 136 134*  K 4.1 4.0  CL 102 99*  CO2 28 26  BUN 10 10  CREATININE 1.24 1.23  GLUCOSE 182* 206*  CALCIUM 8.8* 9.0   No results for input(s): LABPT, INR in the last 72 hours.  Right Thigh: Compartment soft  Dressing clean dry and intact. Left foot: Great toe amputation site without drainage or erythema. Dorsal pedal pulse present.  Assessment/Plan: 2 Days Post-Op Procedure(s) (LRB): IRRIGATION AND DEBRIDEMENT RIGHT HIP (Right) Up with therapy  Right hip aspirate positive for MRSA. Deep cultures pending. Continue current care.  Dr. Lajoyce Corners to return next week for follow up care.     Ryan Shaw 11/14/2016, 8:45 AM

## 2016-11-15 LAB — COMPREHENSIVE METABOLIC PANEL
ALK PHOS: 334 U/L — AB (ref 38–126)
ALT: 56 U/L (ref 17–63)
ANION GAP: 8 (ref 5–15)
AST: 64 U/L — ABNORMAL HIGH (ref 15–41)
Albumin: 2.7 g/dL — ABNORMAL LOW (ref 3.5–5.0)
BILIRUBIN TOTAL: 0.7 mg/dL (ref 0.3–1.2)
BUN: 14 mg/dL (ref 6–20)
CALCIUM: 8.9 mg/dL (ref 8.9–10.3)
CO2: 25 mmol/L (ref 22–32)
CREATININE: 1.17 mg/dL (ref 0.61–1.24)
Chloride: 100 mmol/L — ABNORMAL LOW (ref 101–111)
GFR calc Af Amer: >60 mL/min (ref >60)
GFR calc non Af Amer: >60 mL/min (ref >60)
GLUCOSE: 266 mg/dL — AB (ref 65–99)
Potassium: 4.2 mmol/L (ref 3.5–5.1)
Sodium: 133 mmol/L — ABNORMAL LOW (ref 135–145)
TOTAL PROTEIN: 6.5 g/dL (ref 6.5–8.1)

## 2016-11-15 LAB — CBC
HCT: 30.9 % — ABNORMAL LOW (ref 39.0–52.0)
HEMOGLOBIN: 10.2 g/dL — AB (ref 13.0–17.0)
MCH: 27.1 pg (ref 26.0–34.0)
MCHC: 33 g/dL (ref 30.0–36.0)
MCV: 82.2 fL (ref 78.0–100.0)
PLATELETS: 252 10*3/uL (ref 150–400)
RBC: 3.76 MIL/uL — ABNORMAL LOW (ref 4.22–5.81)
RDW: 13.4 % (ref 11.5–15.5)
WBC: 4.6 10*3/uL (ref 4.0–10.5)

## 2016-11-15 LAB — AEROBIC CULTURE W GRAM STAIN (SUPERFICIAL SPECIMEN)

## 2016-11-15 LAB — GLUCOSE, CAPILLARY
GLUCOSE-CAPILLARY: 246 mg/dL — AB (ref 65–99)
Glucose-Capillary: 307 mg/dL — ABNORMAL HIGH (ref 65–99)
Glucose-Capillary: 226 mg/dL — ABNORMAL HIGH (ref 65–99)
Glucose-Capillary: 235 mg/dL — ABNORMAL HIGH (ref 65–99)

## 2016-11-15 LAB — AEROBIC CULTURE  (SUPERFICIAL SPECIMEN)

## 2016-11-15 MED ORDER — DOXYCYCLINE HYCLATE 100 MG PO TABS
100.0000 mg | ORAL_TABLET | Freq: Two times a day (BID) | ORAL | Status: DC
  Administered 2016-11-15: 100 mg via ORAL
  Filled 2016-11-15: qty 1

## 2016-11-15 MED ORDER — INSULIN ASPART 100 UNIT/ML ~~LOC~~ SOLN
10.0000 [IU] | Freq: Three times a day (TID) | SUBCUTANEOUS | Status: DC
  Administered 2016-11-15 – 2016-11-16 (×4): 10 [IU] via SUBCUTANEOUS

## 2016-11-15 NOTE — Progress Notes (Signed)
      INFECTIOUS DISEASE ATTENDING ADDENDUM:   Date: 11/15/2016  Patient name: Ryan Shaw  Medical record number: [id number]  Date of birth: 07-23-1966    I got in touch with Dr. Lajoyce Corners (who is on vacation)  Hip infection DOES NOT INVOLVE JOINT  IT DOES NOT GO TO BONE  We can change him to PO DOXY and I would give him 3 week total course of antibiotics postop  I will have him followup with [place] as well    Acey Lav 11/15/2016, 1:55 PM

## 2016-11-15 NOTE — Progress Notes (Signed)
Pharmacy Antibiotic Note Ryan Shaw is a 51 y.o. male admitted on 11/09/2016 with septic hip. Pharmacy has been consulted for Daptomycin dosing.  Tmax/24h is 100.9, WBC within normal limits, and CK from 4/13 was 83 u/L. R hip aspirate shows MRSA. SCr stable around 1 mg/dL.   Plan: 1. Continue daptomycin 900 mg (8 mg/kg ) every 24 hours 2. Weekly CK, consider holding atorvastatin depending on length of therapy  3. Follow up length of therapy and ID recommendations  Height:  (193 cm) Weight: 247 lb 6.4 oz (112.2 kg) IBW/kg (Calculated) : 86.8  Temp (24hrs), Avg:99.7 F (37.6 C), Min:97.9 F (36.6 C), Max:100.9 F (38.3 C)   Recent Labs Lab 11/09/16 1924  11/11/16 0428 11/12/16 0429 11/13/16 0501 11/14/16 0408 11/15/16 0531  WBC  --   < > 7.7 8.3 6.7 6.3 4.6  CREATININE  --   < > 0.99 1.13 1.24 1.23 1.17  LATICACIDVEN 1.11  --   --   --   --   --   --   < > = values in this interval not displayed.  Estimated Creatinine Clearance: 103.6 mL/min (by C-G formula based on SCr of 1.17 mg/dL).    Allergies  Allergen Reactions  . Other Nausea And Vomiting    NeuRemedy - Dietary Supplement  . Vancomycin Other (See Comments)    Given after surgery in January 2018 at [facility] - shut kidneys down per pt    Microbiology results: 4/9 Blood Cx: ngtd 4/11 Right hip superficial Cx: rare GPCs, moderate staph aureus 4/11 R hip joint fluid: MRSA (R-cipro, clinda, erythro, oxacillin)  4/11 surgical nasal PCR: + MRSA 4/12 wound cx: few GPCs in clusters   Thank you for allowing pharmacy to be a part of this patient's care.  Carylon Perches, PharmD Acute Care Pharmacy Resident  Pager: [phone] 11/15/2016

## 2016-11-15 NOTE — Progress Notes (Signed)
PROGRESS NOTE  Ryan Shaw  ZOX:[id number] DOB: [birth year] DOA: 11/09/2016 PCP: Jaclyn Shaggy, MD  Brief Narrative:   Ryan Shaw is a 51 y.o. male with history of diabetes mellitus type 2, hypertension, hyperlipidemia presents to the ER with increasing pain over the amputation site of the left great toe. Patient had amputation about 6 weeks ago for infected toe. Patient had recently followed up with Dr. Lajoyce Corners last week at which time he was feeling well.  Since that appointment, he has had increasing redness, swelling and pain in the left foot, pain in tips of toes and also near ankles.  Last 2 days he developed fever and chills.  X-rays of the left foot showed collapse of the subchondral bone of the left foot amputation site. MRI also concerning for early distal 1st metatarsal osteomyelitis.  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  ABIs were normal and he has good pedal pulse.  Dr. Lajoyce Corners has been notified for biopsy vs. Amputation.  He felt it was likely gout and less likely infection.  Pt developed in the interim a right hip abscess and to OR for I &D 4/12 Dr. Lajoyce Corners.  ID following.  Assessment & Plan:   Principal Problem:   Osteomyelitis of left foot (HCC) Active Problems:   Type 2 diabetes mellitus with diabetic neuropathy, with long-term current use of insulin (HCC)   Essential hypertension   CKD (chronic kidney disease), stage III   Osteomyelitis (HCC)   Idiopathic chronic gout of left ankle without tophus   Cellulitis of left lower extremity   Fever   Right hip pain   Septic hip (HCC)   Acute right ankle pain   Abscess of bursa of right hip   MRSA infection  Left foot pain likely from gout per orthopedics -  Dr. Lajoyce Corners consulted -  ABIs normal -  Recent HIV NR  Diabetes mellitus type 2, uncontrolled, CBG well controlled since admission -  A1c 13.5 on 10/08/2016 CBG (last 3)   Recent Labs  11/14/16 2109 11/15/16 0749 11/15/16 1146  GLUCAP 199* 307* 246*   Hypertension, blood pressures stable -  Continue lisinopril and norvasc  MRSA Abscess right hip - I called and asked orthopedist Dr. Lajoyce Corners to eval and taking him to OR 4/12 for I&D. Bedside cultures done 4/11: MRSA positive, Pt is MRSA positive. ID started on daptomycin 4/12.  Deep tissue cultures: MRSA multidrug resistant  Hyperlipidemia, stable -  Continue statin  Tobacco abuse, advised to quit smoking  Mildly elevated LFTs, suspect fatty liver and dehydration.  Bili is normal -  acute hepatitis panel negative  DVT prophylaxis:  SCDs Code Status:  Full  Family Communication:  Patient Disposition Plan:  TBD  Consultants:   Dr. Lajoyce Corners  Infectious disease  Procedures:   ABIs MRI left foot  Antimicrobials:  Anti-infectives    Start     Dose/Rate Route Frequency Ordered Stop   11/12/16 2300  ceFAZolin (ANCEF) IVPB 1 g/50 mL premix     1 g 100 mL/hr over 30 Minutes Intravenous Every 6 hours 11/12/16 1830 11/13/16 1129   11/12/16 2100  DAPTOmycin (CUBICIN) 900 mg in sodium chloride 0.9 % IVPB     900 mg 236 mL/hr over 30 Minutes Intravenous Every 24 hours 11/12/16 2035     11/12/16 1545  ceFAZolin (ANCEF) IVPB 2g/100 mL premix     2 g 200 mL/hr over  30 Minutes Intravenous  Once 11/12/16 0844 11/12/16 1717   11/12/16 0600  ceFAZolin (ANCEF) IVPB 2g/100 mL premix  Status:  Discontinued     2 g 200 mL/hr over 30 Minutes Intravenous On call to O.R. 11/11/16 2039 11/12/16 0844   11/11/16 1315  doxycycline (VIBRA-TABS) tablet 100 mg  Status:  Discontinued     100 mg Oral Every 12 hours 11/11/16 1310 11/11/16 1658   11/10/16 2200  piperacillin-tazobactam (ZOSYN) IVPB 3.375 g  Status:  Discontinued     3.375 g 12.5 mL/hr over 240 Minutes Intravenous Every 8 hours 11/10/16 1529 11/10/16 1539   11/10/16 1530  piperacillin-tazobactam (ZOSYN) IVPB 3.375 g  Status:  Discontinued     3.375 g 100 mL/hr over 30  Minutes Intravenous  Once 11/10/16 1528 11/10/16 1539   11/09/16 2245  linezolid (ZYVOX) IVPB 600 mg     600 mg 300 mL/hr over 60 Minutes Intravenous  Once 11/09/16 2230 11/09/16 2343   11/09/16 2100  piperacillin-tazobactam (ZOSYN) IVPB 3.375 g     3.375 g 100 mL/hr over 30 Minutes Intravenous  Once 11/09/16 2045 11/09/16 2236   11/09/16 2045  vancomycin (VANCOCIN) 2,000 mg in sodium chloride 0.9 % 500 mL IVPB  Status:  Discontinued     2,000 mg 250 mL/hr over 120 Minutes Intravenous  Once 11/09/16 2045 11/09/16 2229   11/09/16 1930  clindamycin (CLEOCIN) IVPB 600 mg     600 mg 100 mL/hr over 30 Minutes Intravenous  Once 11/09/16 1923 11/09/16 2027     Subjective:  Pt without complaints this morning. Says pain control has been adequate    Objective: Vitals:   11/14/16 1612 11/14/16 2111 11/15/16 0440 11/15/16 0937  BP: 130/64 122/61 (!) 116/59 123/67  Pulse: 89 96 96 87  Resp: Temp: 99.4 F (37.4 C) (!) 100.9 F (38.3 C) (!) 100.5 F (38.1 C) 97.9 F (36.6 C)  TempSrc: Oral Oral Oral Oral  SpO2: 100% 98% 96% 97%  Weight:  112.2 kg (247 lb 6.4 oz)    Height:        Intake/Output Summary (Last 24 hours) at 11/15/16 1352 Last data filed at 11/15/16 1610  Gross per 24 hour  Intake              420 ml  Output              450 ml  Net              -30 ml   Filed Weights   11/12/16 2114 11/13/16 2137 11/14/16 2111  Weight: 110.8 kg (244 lb 4.8 oz) 111.3 kg (245 lb 6.4 oz) 112.2 kg (247 lb 6.4 oz)    Examination:  General exam:  Adult male.  No acute distress.  HEENT:  NCAT, MMM Respiratory system: Clear to auscultation bilaterally Cardiovascular system: Regular rate and rhythm, normal S1/S2. No murmurs, rubs, gallops or clicks.  Warm extremities Gastrointestinal system: Normal active bowel sounds, soft, nondistended, nontender. MSK:  Normal tone and bulk, left foot with 1st toe amputation. Wound CDI. Eschar overlies previous surgical scar area.  Nontender  to palpation, no overlying erythema.    5mm ulcer on the dorsum of the left foot with much less erythema.   Neuro:  Grossly intact.   Data Reviewed: I have personally reviewed following labs and imaging studies  CBC:  Recent Labs Lab 11/09/16 1912  11/11/16 9604 11/12/16 0429 11/13/16 0501 11/14/16 0408 11/15/16 5409  WBC 10.7*  < > 7.7 8.3 6.7 6.3 4.6  NEUTROABS 8.2*  --   --   --   --   --   --   HGB 11.6*  < > 10.1* 11.0* 10.8* 10.6* 10.2*  HCT 35.7*  < > 31.8* 34.2* 33.7* 32.7* 30.9*  MCV 81.7  < > 82.8 82.8 83.4 82.6 82.2  PLT 179  < > 152 193 223 236 252  < > = values in this interval not displayed. Basic Metabolic Panel:  Recent Labs Lab 11/11/16 0428 11/12/16 0429 11/13/16 0501 11/14/16 0408 11/15/16 0531  NA 140 140 136 134* 133*  K 3.6 3.8 4.1 4.0 4.2  CL 109 108 102 99* 100*  CO2 GLUCOSE 115* 107* 182* 206* 266*  BUN CREATININE 0.99 1.13 1.24 1.23 1.17  CALCIUM 8.5* 9.2 8.8* 9.0 8.9   GFR: Estimated Creatinine Clearance: 103.6 mL/min (by C-G formula based on SCr of 1.17 mg/dL). Liver Function Tests:  Recent Labs Lab 11/11/16 0428 11/12/16 0429 11/13/16 0501 11/14/16 0408 11/15/16 0531  AST 39 64*  ALT 66* 54 41 40 56  ALKPHOS 209* 221* 210* 287* 334*  BILITOT 0.6 0.6 0.8 0.6 0.7  PROT 5.9* 6.7 6.6 6.8 6.5  ALBUMIN 2.8* 3.1* 2.9* 2.9* 2.7*   No results for input(s): LIPASE, AMYLASE in the last 168 hours. No results for input(s): AMMONIA in the last 168 hours. Coagulation Profile:  Recent Labs Lab 11/09/16 1912  INR 1.07   Cardiac Enzymes:  Recent Labs Lab 11/13/16 0501  CKTOTAL 83   BNP (last 3 results) No results for input(s): PROBNP in the last 8760 hours. HbA1C: No results for input(s): HGBA1C in the last 72 hours. CBG:  Recent Labs Lab 11/14/16 1151 11/14/16 1612 11/14/16 2109 11/15/16 0749 11/15/16 1146  GLUCAP 215* 189* 199* 307* 246*   Lipid Profile: No results for  input(s): CHOL, HDL, LDLCALC, TRIG, CHOLHDL, LDLDIRECT in the last 72 hours. Thyroid Function Tests: No results for input(s): TSH, T4TOTAL, FREET4, T3FREE, THYROIDAB in the last 72 hours. Anemia Panel: No results for input(s): VITAMINB12, FOLATE, FERRITIN, TIBC, IRON, RETICCTPCT in the last 72 hours. Urine analysis:    Component Value Date/Time   COLORURINE YELLOW 11/10/2016 0127   APPEARANCEUR CLEAR 11/10/2016 0127   LABSPEC 1.018 11/10/2016 0127   PHURINE 5.0 11/10/2016 0127   GLUCOSEU NEGATIVE 11/10/2016 0127   HGBUR NEGATIVE 11/10/2016 0127   BILIRUBINUR NEGATIVE 11/10/2016 0127   BILIRUBINUR neg 10/24/2014 1152   KETONESUR NEGATIVE 11/10/2016 0127   PROTEINUR NEGATIVE 11/10/2016 0127   UROBILINOGEN 0.2 03/30/2015 2059   NITRITE NEGATIVE 11/10/2016 0127   LEUKOCYTESUR NEGATIVE 11/10/2016 0127    Recent Results (from the past 240 hour(s))  Culture, blood (Routine x 2)     Status: None   Collection Time: 11/09/16  7:19 PM  Result Value Ref Range Status   Specimen Description BLOOD RIGHT ANTECUBITAL  Final   Special Requests   Final    BOTTLES DRAWN AEROBIC AND ANAEROBIC Blood Culture adequate volume   Culture NO GROWTH 5 DAYS  Final   Report Status 11/14/2016 FINAL  Final  Culture, blood (Routine x 2)     Status: None   Collection Time: 11/09/16  7:19 PM  Result Value Ref Range Status   Specimen Description BLOOD LEFT ANTECUBITAL  Final   Special Requests   Final    BOTTLES DRAWN AEROBIC ONLY  Blood Culture adequate volume   Culture NO GROWTH 5 DAYS  Final   Report Status 11/14/2016 FINAL  Final  Body fluid culture     Status: None   Collection Time: 11/11/16  2:26 PM  Result Value Ref Range Status   Specimen Description FLUID RIGHT HIP  Final   Special Requests JOINT HIP SWAB  Final   Gram Stain   Final    MODERATE WBC PRESENT, PREDOMINANTLY PMN RARE GRAM POSITIVE COCCI IN CLUSTERS IN PAIRS    Culture   Final    MODERATE METHICILLIN RESISTANT STAPHYLOCOCCUS AUREUS     Report Status 11/13/2016 FINAL  Final   Organism ID, Bacteria METHICILLIN RESISTANT STAPHYLOCOCCUS AUREUS  Final      Susceptibility   Methicillin resistant staphylococcus aureus - MIC*    CIPROFLOXACIN >=8 RESISTANT Resistant     ERYTHROMYCIN >=8 RESISTANT Resistant     GENTAMICIN <=0.5 SENSITIVE Sensitive     OXACILLIN >=4 RESISTANT Resistant     TETRACYCLINE <=1 SENSITIVE Sensitive     VANCOMYCIN 1 SENSITIVE Sensitive     TRIMETH/SULFA <=10 SENSITIVE Sensitive     CLINDAMYCIN >=8 RESISTANT Resistant     RIFAMPIN <=0.5 SENSITIVE Sensitive     Inducible Clindamycin NEGATIVE Sensitive     * MODERATE METHICILLIN RESISTANT STAPHYLOCOCCUS AUREUS  Surgical pcr screen     Status: Abnormal   Collection Time: 11/11/16  8:53 PM  Result Value Ref Range Status   MRSA, PCR POSITIVE (A) NEGATIVE Final    Comment: RESULT CALLED TO, READ BACK BY AND VERIFIED WITH: C BOKIAGON,RN  11/11/16 MKELLY,MLT    Staphylococcus aureus POSITIVE (A) NEGATIVE Final    Comment:        The Xpert SA Assay (FDA approved for NASAL specimens in patients over 37 years of age), is one component of a comprehensive surveillance program.  Test performance has been validated by [facility] for patients greater than or equal to 52 year old. It is not intended to diagnose infection nor to guide or monitor treatment.   Anaerobic culture     Status: None (Preliminary result)   Collection Time: 11/12/16  5:33 PM  Result Value Ref Range Status   Specimen Description WOUND RIGHT HIP  Final   Special Requests PATIENT ON FOLLOWING ANCEF  Final   Culture   Final    NO ANAEROBES ISOLATED; CULTURE IN PROGRESS FOR 5 DAYS   Report Status PENDING  Incomplete  Aerobic Culture (superficial specimen)     Status: None   Collection Time: 11/12/16  5:33 PM  Result Value Ref Range Status   Specimen Description WOUND RIGHT HIP  Final   Special Requests PATIENT ON FOLLOWING ANCEF  Final   Gram Stain   Final    ABUNDANT  WBC PRESENT,BOTH PMN AND MONONUCLEAR FEW GRAM POSITIVE COCCI IN CLUSTERS    Culture   Final    MODERATE METHICILLIN RESISTANT STAPHYLOCOCCUS AUREUS   Report Status 11/15/2016 FINAL  Final   Organism ID, Bacteria METHICILLIN RESISTANT STAPHYLOCOCCUS AUREUS  Final      Susceptibility   Methicillin resistant staphylococcus aureus - MIC*    CIPROFLOXACIN >=8 RESISTANT Resistant     ERYTHROMYCIN >=8 RESISTANT Resistant     GENTAMICIN <=0.5 SENSITIVE Sensitive     OXACILLIN >=4 RESISTANT Resistant     TETRACYCLINE >=16 RESISTANT Resistant     VANCOMYCIN <=0.5 SENSITIVE Sensitive     TRIMETH/SULFA <=10 SENSITIVE Sensitive     CLINDAMYCIN >=8 RESISTANT Resistant  RIFAMPIN <=0.5 SENSITIVE Sensitive     Inducible Clindamycin NEGATIVE Sensitive     * MODERATE METHICILLIN RESISTANT STAPHYLOCOCCUS AUREUS    Radiology Studies: No results found. Scheduled Meds: . amLODipine  10 mg Oral QHS  . atorvastatin  40 mg Oral QHS  . Chlorhexidine Gluconate Cloth  6 each Topical Q0600  . DAPTOmycin (CUBICIN)  IV  900 mg Intravenous Q24H  . docusate sodium  100 mg Oral BID  . feeding supplement (GLUCERNA SHAKE)  237 mL Oral TID BM  . insulin aspart  0-9 Units Subcutaneous TID WC  . insulin aspart  10 Units Subcutaneous TID WC  . insulin detemir  50 Units Subcutaneous QHS  . lisinopril  5 mg Oral QHS  . metoprolol tartrate  25 mg Oral BID  . mupirocin ointment  1 application Nasal BID  . pregabalin  100 mg Oral BID   Continuous Infusions: . sodium chloride 10 mL/hr at 11/12/16 1856  . lactated ringers Stopped (11/12/16 1746)    LOS: 6 days   Time spent: 20 min  Standley Dakins, MD [facility] Pager [phone]  If 7PM-7AM, please contact night-coverage [email or web address] Password [email or web address] 11/15/2016, 1:52 PM

## 2016-11-16 LAB — COMPREHENSIVE METABOLIC PANEL
ALBUMIN: 2.8 g/dL — AB (ref 3.5–5.0)
ALK PHOS: 345 U/L — AB (ref 38–126)
ALT: 60 U/L (ref 17–63)
ANION GAP: 7 (ref 5–15)
AST: 60 U/L — ABNORMAL HIGH (ref 15–41)
BILIRUBIN TOTAL: 0.5 mg/dL (ref 0.3–1.2)
BUN: 16 mg/dL (ref 6–20)
CALCIUM: 8.9 mg/dL (ref 8.9–10.3)
CO2: 23 mmol/L (ref 22–32)
CREATININE: 1.1 mg/dL (ref 0.61–1.24)
Chloride: 103 mmol/L (ref 101–111)
GFR calc non Af Amer: >60 mL/min (ref >60)
GLUCOSE: 242 mg/dL — AB (ref 65–99)
Potassium: 4.7 mmol/L (ref 3.5–5.1)
Sodium: 133 mmol/L — ABNORMAL LOW (ref 135–145)
TOTAL PROTEIN: 6.8 g/dL (ref 6.5–8.1)

## 2016-11-16 LAB — CBC
HCT: 33.2 % — ABNORMAL LOW (ref 39.0–52.0)
HEMOGLOBIN: 10.6 g/dL — AB (ref 13.0–17.0)
MCH: 26.1 pg (ref 26.0–34.0)
MCHC: 31.9 g/dL (ref 30.0–36.0)
MCV: 81.8 fL (ref 78.0–100.0)
PLATELETS: 275 10*3/uL (ref 150–400)
RBC: 4.06 MIL/uL — ABNORMAL LOW (ref 4.22–5.81)
RDW: 12.9 % (ref 11.5–15.5)
WBC: 3.8 10*3/uL — ABNORMAL LOW (ref 4.0–10.5)

## 2016-11-16 LAB — GLUCOSE, CAPILLARY
GLUCOSE-CAPILLARY: 199 mg/dL — AB (ref 65–99)
GLUCOSE-CAPILLARY: 167 mg/dL — AB (ref 65–99)

## 2016-11-16 MED ORDER — SULFAMETHOXAZOLE-TRIMETHOPRIM 800-160 MG PO TABS
1.0000 | ORAL_TABLET | Freq: Two times a day (BID) | ORAL | Status: DC
  Administered 2016-11-16: 1 via ORAL
  Filled 2016-11-16: qty 1

## 2016-11-16 MED ORDER — SULFAMETHOXAZOLE-TRIMETHOPRIM 800-160 MG PO TABS: 1.0000 | ORAL_TABLET | Freq: Two times a day (BID) | ORAL | 0 refills | Status: AC

## 2016-11-16 MED ORDER — INSULIN ASPART 100 UNIT/ML FLEXPEN: 10.0000 [IU] | PEN_INJECTOR | Freq: Three times a day (TID) | SUBCUTANEOUS | 11 refills | Status: DC

## 2016-11-16 MED FILL — ?LISINOPRIL 5 MG TABLET: 5 | 30 days supply | Qty: 30 | Fill #1

## 2016-11-16 NOTE — Progress Notes (Signed)
   Met w/patient at bedside.  Patient communicated fear of the unknown ("What's next?")  Unpacked pt. needs (financial concerns, lack of footwear).  Offered grief support.  Patient recently lost son; living in [place] with daughter.   Will follow, as needed.  - Rev. Sobieski MDiv ThM

## 2016-11-16 NOTE — Discharge Summary (Addendum)
Physician Discharge Summary  Ryan Shaw OZH:[id number] DOB: 22-Nov-1965 DOA: 11/09/2016  PCP: Jaclyn Shaggy, MD  Admit date: 11/09/2016 Discharge date: 11/16/2016  Admitted From: Home  Disposition:  Home   Recommendations for Outpatient Follow-up:  1. Follow up with PCP in 1 weeks 2. Please follow up with orthopedics Lajoyce Corners in 2 weeks 3. Follow up with infection disease Dr. Daiva Eves in 3 weeks. 4. Please evaluate for gout of left foot and consider treatment if appropriate  Discharge Condition: STABLE  CODE STATUS: FULL  Diet recommendation: Heart Healthy / Carb Modified  Brief/Interim Summary: HPI: Ryan Shaw is a 51 y.o. male with history of diabetes mellitus type 2, hypertension, hyperlipidemia presents to the ER with increasing pain over the amputation site of the left great toe. Patient had amputation about 6 weeks ago for infected toe. Patient had recently followed up with Dr. Lajoyce Corners last week. Last 2 days patient has been experiencing increasing pain with subjective feeling of fever and chills.   ED Course: In the ER x-rays of the left foot shows collapse of the subchondral bone of the left foot amputation site. Concerning for osteomyelitis or avascular necrosis. Patient's pain radiates all the way up to the leg. Patient also is mildly febrile with temperatures running around 100.4. Mild leukocytosis. Patient is being admitted for further management of possible osteomyelitis. 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  Brief Narrative:   Ryan Johnsonis a 50 y.o.malewith history of diabetes mellitus type 2, hypertension, hyperlipidemia presents to the ER with increasing pain over the amputation site of the left great toe. Patient had amputation about 6 weeks ago for infected toe. Patient had recently followed up with Dr. Lajoyce Corners last week at which time he was feeling well.  Since that appointment, he has had increasing  redness, swelling and pain in the left foot, pain in tips of toes and also near ankles.  Last 2 days he developed fever and chills. X-rays of the left foot showed collapse of the subchondral bone of the left foot amputation site. MRI also concerning for early distal 1st metatarsal osteomyelitis.  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  ABIs were normal and he has good pedal pulse.  Dr. Lajoyce Corners has been notified for biopsy vs. Amputation.  He felt it was likely gout and less likely infection.  Pt developed in the interim a right hip abscess and to OR for I &D 4/12 Dr. Lajoyce Corners.  ID following.  Assessment & Plan:   Principal Problem:   Osteomyelitis of left foot (HCC) Active Problems:   Type 2 diabetes mellitus with diabetic neuropathy, with long-term current use of insulin (HCC)   Essential hypertension   CKD (chronic kidney disease), stage III   Osteomyelitis (HCC)   Idiopathic chronic gout of left ankle without tophus   Cellulitis of left lower extremity   Fever   Right hip pain   Septic hip (HCC)   Acute right ankle pain   Abscess of bursa of right hip   MRSA infection  Left foot pain likely from gout per orthopedics -  Dr. Lajoyce Corners consulted -  ABIs normal -  Recent HIV NR -  Did not place him on prednisone due to infection in hip and hyperglycemia, would have him follow up with PCP and orthopedics to reassess and start meds if needed, he is no  longer complaining of pain or swelling or inflammation of left foot  Diabetes mellitus type 2, uncontrolled, CBG well controlled since admission -  A1c 13.5 on 10/08/2016 He likely is not taking insulin as prescribed at home due to multiple issues.  He should follow up with PCP in a week.  He says he has all of his insulin and supplies at home.   CBG (last 3)   CBG (last 3)   Recent Labs  11/15/16 2204 11/16/16 0755 11/16/16 1204  GLUCAP 235* 199* 167*    Hypertension, blood pressures stable -  Continue lisinopril and norvasc  MRSA Abscess right hip - I called and asked orthopedist Dr. Lajoyce Corners to eval and taking him to OR 4/12 for I&D. Bedside cultures done 4/11: MRSA positive, Pt is MRSA positive. ID started on daptomycin 4/12.  Deep tissue cultures: MRSA multidrug resistant, started on oral bactrim DS for 3 weeks, will discharge with 3 week course of oral bactrim DS.  According to Dr. Lajoyce Corners the infection did not penetrate down to bone of hip.  Therefore ID recommending 3 week course of Bactrim DS.  Follow up with Dr. Lajoyce Corners in 3 weeks, Follow up with ID in 3 weeks.  Wound care instructions per orthopedics.  Pain management per orthopedics.   Hyperlipidemia, stable -  Continue statin  Tobacco abuse, advised to quit smoking  Mildly elevated LFTs, suspect fatty liver and dehydration.  Bili is normal -  acute hepatitis panel negative  DVT prophylaxis:  SCDs Code Status:  Full  Family Communication:  Patient Disposition Plan:  Home   Consultants:   Dr. Lajoyce Corners  Infectious disease  Discharge Diagnoses:  Principal Problem:   Osteomyelitis of left foot (HCC) Active Problems:   Type 2 diabetes mellitus with diabetic neuropathy, with long-term current use of insulin (HCC)   Essential hypertension   CKD (chronic kidney disease), stage III   Osteomyelitis (HCC)   Idiopathic chronic gout of left ankle without tophus   Cellulitis of left lower extremity   Fever   Right hip pain   Septic hip (HCC)   Acute right ankle pain   Abscess of bursa of right hip   MRSA infection  Discharge Instructions  Discharge Instructions    Increase activity slowly    Complete by:  As directed      Allergies as of 11/16/2016      Reactions   Other Nausea And Vomiting   NeuRemedy - Dietary Supplement   Vancomycin Other (See Comments)   Given after surgery in January 2018 at [facility] - shut kidneys down per pt       Medication List    STOP taking these medications   HYDROcodone-acetaminophen 5-325 MG tablet Commonly known as:  NORCO/VICODIN   ibuprofen 800 MG tablet Commonly known as:  ADVIL,MOTRIN   traMADol 50 MG tablet Commonly known as:  ULTRAM     TAKE these medications   acetaminophen 500 MG tablet Commonly known as:  TYLENOL Take 1,000 mg by mouth every 6 (six) hours as needed for headache (pain).   amLODipine 10 MG tablet Commonly known as:  NORVASC Take 10 mg by mouth at bedtime.   atorvastatin 40 MG tablet Commonly known as:  LIPITOR Take 40 mg by mouth at bedtime.   insulin aspart 100 UNIT/ML FlexPen Commonly known as:  NOVOLOG FLEXPEN Inject 10 Units into the skin 3 (three) times daily with meals. What changed:  how much to take  when to  take this  reasons to take this   insulin detemir 100 UNIT/ML injection Commonly known as:  LEVEMIR Inject 0.5 mLs (50 Units total) into the skin at bedtime.   lisinopril 5 MG tablet Commonly known as:  PRINIVIL,ZESTRIL Take 1 tablet (5 mg total) by mouth daily. What changed:  when to take this   metoprolol tartrate 25 MG tablet Commonly known as:  LOPRESSOR Take 25 mg by mouth 2 (two) times daily.   pregabalin 100 MG capsule Commonly known as:  LYRICA Take 1 capsule (100 mg total) by mouth 3 (three) times daily. What changed:  when to take this   sildenafil 25 MG tablet Commonly known as:  VIAGRA Take 1 tablet (25 mg total) by mouth daily as needed for erectile dysfunction. What changed:  how much to take   sulfamethoxazole-trimethoprim 800-160 MG tablet Commonly known as:  BACTRIM DS,SEPTRA DS Take 1 tablet by mouth every 12 (twelve) hours.      Follow-up Information    Nadara Mustard, MD. Schedule an appointment as soon as possible for a visit in 2 week(s).   Specialty:  Orthopedic Surgery Contact information: [address] [place] [address] [phone]        Jaclyn Shaggy, MD. Schedule  an appointment as soon as possible for a visit in 1 week(s).   Specialty:  Family Medicine Contact information: [address] [place] [address] [phone]        Acey Lav, MD. Schedule an appointment as soon as possible for a visit in 3 week(s).   Specialty:  Infectious Diseases Contact information: [address] [place] [address] [phone]          Allergies  Allergen Reactions  . Other Nausea And Vomiting    NeuRemedy - Dietary Supplement  . Vancomycin Other (See Comments)    Given after surgery in January 2018 at [facility] - shut kidneys down per pt    Procedures/Studies: Dg Chest 2 View  Result Date: 11/09/2016 CLINICAL DATA:  Left leg pain EXAM: CHEST  2 VIEW COMPARISON:  10/18/2016 FINDINGS: The heart size and mediastinal contours are within normal limits. Both lungs are clear. The visualized skeletal structures are unremarkable. IMPRESSION: No active cardiopulmonary disease. Electronically Signed   By: Signa Kell M.D.   On: 11/09/2016 20:28   Dg Ribs Unilateral W/chest Left  Result Date: 10/18/2016 CLINICAL DATA:  Status post fall with trauma to the left chest wall . EXAM: LEFT RIBS AND CHEST - 3+ VIEW COMPARISON:  None. FINDINGS: No fracture or other bone lesions are seen involving the ribs. There is no evidence of pneumothorax or pleural effusion. Both lungs are clear. Heart size and mediastinal contours are within normal limits. IMPRESSION: Negative. Electronically Signed   By: Ted Mcalpine M.D.   On: 10/18/2016 16:13   Mr Foot Left Wo Contrast  Result Date: 11/10/2016 CLINICAL DATA:  Increasing pain at the site of amputation of the left great toe. EXAM: MRI OF THE LEFT FOOT WITHOUT CONTRAST TECHNIQUE: Multiplanar, multisequence MR imaging of the left forefoot was performed. No intravenous contrast was administered. COMPARISON:  radiographs dated 11/09/2016 and 10/03/2016 and MRI dated 02/06/2015  FINDINGS: Bones/Joint/Cartilage There is a thin rim of edema deep to the articular cortex of the distal first metatarsal which correlates with subcortical lucencies seen on radiographs. There is no cortical destruction or extensive edema in the metatarsal head. There is a small amount of fluid surrounding the  first metatarsal head. The other bones of the forefoot appear normal. IMPRESSION: Findings are suggestive of early osteomyelitis of the head of the first metatarsal. Electronically Signed   By: Francene Boyers M.D.   On: 11/10/2016 09:23   Dg Shoulder Left  Result Date: 10/18/2016 CLINICAL DATA:  Pain status post fall with trauma to the left shoulder. EXAM: LEFT SHOULDER - 2+ VIEW COMPARISON:  None. FINDINGS: There is no evidence of fracture or dislocation. There is no evidence of arthropathy or other focal bone abnormality. Soft tissues are unremarkable. IMPRESSION: Negative. Electronically Signed   By: Ted Mcalpine M.D.   On: 10/18/2016 16:13   Dg Foot Complete Left  Result Date: 11/09/2016 CLINICAL DATA:  Left leg pain starting 30 minutes ago. History of left big toe amputation 1 month ago. Pain and tenderness. EXAM: LEFT FOOT - COMPLETE 3+ VIEW COMPARISON:  10/30/2016 FINDINGS: Indication of the great toe at the first MTP articulation. Subchondral lucency involving the head of the first metatarsal is new without cortical bone destruction. Findings could potentially represent subtle changes of AVN or early changes of osteomyelitis. No periosteal new bone formation is identified. No acute displaced appearing fracture is seen. Mild superficial debris is noted at the stump of the great toe. Small plantar and dorsal calcaneal enthesophytes are noted. IMPRESSION: 1. Status post amputation of the left great toe at the first MTP articulation. 2. Subchondral lucencies without collapse or cortical bone destruction is seen of the first metatarsal head which is new since prior exam. Early changes of  avascular necrosis or osteomyelitis are not entirely excluded. 3. No acute fracture or malalignment.  Calcaneal enthesophytes. Electronically Signed   By: Tollie Eth M.D.   On: 11/09/2016 20:37   Dg Foot Complete Right  Result Date: 10/27/2016 CLINICAL DATA:  Open sore. EXAM: RIGHT FOOT COMPLETE - 3+ VIEW COMPARISON:  No recent prior. FINDINGS: Degenerative changes first MTP joint. No evidence of fracture or dislocation. Prior second digit amputation. IMPRESSION: Degenerative changes first MTP joint. No acute bony abnormality identified. Electronically Signed   By: Maisie Fus  Register   On: 10/27/2016 15:43   Dg Hip Unilat With Pelvis 2-3 Views Right  Result Date: 11/11/2016 CLINICAL DATA:  Acute onset of right lateral hip pain and swelling. Initial encounter. EXAM: DG HIP (WITH OR WITHOUT PELVIS) 2-3V RIGHT COMPARISON:  None. FINDINGS: There is no evidence of fracture or dislocation. Both femoral heads are seated normally within their respective acetabula. The proximal right femur appears intact. No significant degenerative change is appreciated. The sacroiliac joints are unremarkable in appearance. The visualized bowel gas pattern is grossly unremarkable in appearance. IMPRESSION: No evidence of fracture or dislocation. Electronically Signed   By: Roanna Raider M.D.   On: 11/11/2016 00:40    (Echo, Carotid, EGD, Colonoscopy, ERCP)    Subjective: Pt without complaints.  Wound draining, pain better.   Discharge Exam: Vitals:   11/16/16 0545 11/16/16 1001  BP: 120/68 105/69  Pulse:  76  Resp: 18 18  Temp: 98.6 F (37 C) 98.2 F (36.8 C)   Vitals:   11/15/16 2044 11/15/16 2347 11/16/16 0545 11/16/16 1001  BP: 121/64 118/70 120/68 105/69  Pulse:  85  76  Resp: 16  18 18   Temp: 98.8 F (37.1 C)  98.6 F (37 C) 98.2 F (36.8 C)  TempSrc: Oral  Oral Oral  SpO2: 100% 99% 95% 96%  Weight: 110 kg (242 lb 8 oz)     Height: 6\' 6"  (1.981  m)       General exam:  Adult male.  No acute  distress.  HEENT:  NCAT, MMM Respiratory system: Clear to auscultation bilaterally Cardiovascular system: Regular rate and rhythm, normal S1/S2. No murmurs, rubs, gallops or clicks.  Warm extremities Gastrointestinal system: Normal active bowel sounds, soft, nondistended, nontender. MSK:  Normal tone and bulk, left foot with 1st toe amputation. Wound CDI. Eschar overlies previous surgical scar area.  Nontender to palpation, no overlying erythema.      Neuro:  Grossly intact.   The results of significant diagnostics from this hospitalization (including imaging, microbiology, ancillary and laboratory) are listed below for reference.     Microbiology: Recent Results (from the past 240 hour(s))  Culture, blood (Routine x 2)     Status: None   Collection Time: 11/09/16  7:19 PM  Result Value Ref Range Status   Specimen Description BLOOD RIGHT ANTECUBITAL  Final   Special Requests   Final    BOTTLES DRAWN AEROBIC AND ANAEROBIC Blood Culture adequate volume   Culture NO GROWTH 5 DAYS  Final   Report Status 11/14/2016 FINAL  Final  Culture, blood (Routine x 2)     Status: None   Collection Time: 11/09/16  7:19 PM  Result Value Ref Range Status   Specimen Description BLOOD LEFT ANTECUBITAL  Final   Special Requests   Final    BOTTLES DRAWN AEROBIC ONLY Blood Culture adequate volume   Culture NO GROWTH 5 DAYS  Final   Report Status 11/14/2016 FINAL  Final  Body fluid culture     Status: None   Collection Time: 11/11/16  2:26 PM  Result Value Ref Range Status   Specimen Description FLUID RIGHT HIP  Final   Special Requests JOINT HIP SWAB  Final   Gram Stain   Final    MODERATE WBC PRESENT, PREDOMINANTLY PMN RARE GRAM POSITIVE COCCI IN CLUSTERS IN PAIRS    Culture   Final    MODERATE METHICILLIN RESISTANT STAPHYLOCOCCUS AUREUS   Report Status 11/13/2016 FINAL  Final   Organism ID, Bacteria METHICILLIN RESISTANT STAPHYLOCOCCUS AUREUS  Final      Susceptibility   Methicillin resistant  staphylococcus aureus - MIC*    CIPROFLOXACIN >=8 RESISTANT Resistant     ERYTHROMYCIN >=8 RESISTANT Resistant     GENTAMICIN <=0.5 SENSITIVE Sensitive     OXACILLIN >=4 RESISTANT Resistant     TETRACYCLINE <=1 SENSITIVE Sensitive     VANCOMYCIN 1 SENSITIVE Sensitive     TRIMETH/SULFA <=10 SENSITIVE Sensitive     CLINDAMYCIN >=8 RESISTANT Resistant     RIFAMPIN <=0.5 SENSITIVE Sensitive     Inducible Clindamycin NEGATIVE Sensitive     * MODERATE METHICILLIN RESISTANT STAPHYLOCOCCUS AUREUS  Surgical pcr screen     Status: Abnormal   Collection Time: 11/11/16  8:53 PM  Result Value Ref Range Status   MRSA, PCR POSITIVE (A) NEGATIVE Final    Comment: RESULT CALLED TO, READ BACK BY AND VERIFIED WITH: C BOKIAGON,RN  11/11/16 MKELLY,MLT    Staphylococcus aureus POSITIVE (A) NEGATIVE Final    Comment:        The Xpert SA Assay (FDA approved for NASAL specimens in patients over 12 years of age), is one component of a comprehensive surveillance program.  Test performance has been validated by [facility] for patients greater than or equal to 66 year old. It is not intended to diagnose infection nor to guide or monitor treatment.   Anaerobic culture  Status: None (Preliminary result)   Collection Time: 11/12/16  5:33 PM  Result Value Ref Range Status   Specimen Description WOUND RIGHT HIP  Final   Special Requests PATIENT ON FOLLOWING ANCEF  Final   Culture   Final    NO ANAEROBES ISOLATED; CULTURE IN PROGRESS FOR 5 DAYS   Report Status PENDING  Incomplete  Aerobic Culture (superficial specimen)     Status: None   Collection Time: 11/12/16  5:33 PM  Result Value Ref Range Status   Specimen Description WOUND RIGHT HIP  Final   Special Requests PATIENT ON FOLLOWING ANCEF  Final   Gram Stain   Final    ABUNDANT WBC PRESENT,BOTH PMN AND MONONUCLEAR FEW GRAM POSITIVE COCCI IN CLUSTERS    Culture   Final    MODERATE METHICILLIN RESISTANT STAPHYLOCOCCUS AUREUS   Report  Status 11/15/2016 FINAL  Final   Organism ID, Bacteria METHICILLIN RESISTANT STAPHYLOCOCCUS AUREUS  Final      Susceptibility   Methicillin resistant staphylococcus aureus - MIC*    CIPROFLOXACIN >=8 RESISTANT Resistant     ERYTHROMYCIN >=8 RESISTANT Resistant     GENTAMICIN <=0.5 SENSITIVE Sensitive     OXACILLIN >=4 RESISTANT Resistant     TETRACYCLINE >=16 RESISTANT Resistant     VANCOMYCIN <=0.5 SENSITIVE Sensitive     TRIMETH/SULFA <=10 SENSITIVE Sensitive     CLINDAMYCIN >=8 RESISTANT Resistant     RIFAMPIN <=0.5 SENSITIVE Sensitive     Inducible Clindamycin NEGATIVE Sensitive     * MODERATE METHICILLIN RESISTANT STAPHYLOCOCCUS AUREUS     Labs: BNP (last 3 results) No results for input(s): BNP in the last 8760 hours. Basic Metabolic Panel:  Recent Labs Lab 11/12/16 0429 11/13/16 0501 11/14/16 0408 11/15/16 0531 11/16/16 0438  NA 140 136 134* 133* 133*  K 3.8 4.1 4.0 4.2 4.7  CL 108 102 99* 100* 103  CO2 27 28 26 25 23   GLUCOSE 107* 182* 206* 266* 242*  BUN 9 10 10 14 16   CREATININE 1.13 1.24 1.23 1.17 1.10  CALCIUM 9.2 8.8* 9.0 8.9 8.9   Liver Function Tests:  Recent Labs Lab 11/12/16 0429 11/13/16 0501 11/14/16 0408 11/15/16 0531 11/16/16 0438  AST 23 22 39 64* 60*  ALT 54 41 40 56 60  ALKPHOS 221* 210* 287* 334* 345*  BILITOT 0.6 0.8 0.6 0.7 0.5  PROT 6.7 6.6 6.8 6.5 6.8  ALBUMIN 3.1* 2.9* 2.9* 2.7* 2.8*   No results for input(s): LIPASE, AMYLASE in the last 168 hours. No results for input(s): AMMONIA in the last 168 hours. CBC:  Recent Labs Lab 11/09/16 1912  11/12/16 0429 11/13/16 0501 11/14/16 0408 11/15/16 0531 11/16/16 0438  WBC 10.7*  < > 8.3 6.7 6.3 4.6 3.8*  NEUTROABS 8.2*  --   --   --   --   --   --   HGB 11.6*  < > 11.0* 10.8* 10.6* 10.2* 10.6*  HCT 35.7*  < > 34.2* 33.7* 32.7* 30.9* 33.2*  MCV 81.7  < > 82.8 83.4 82.6 82.2 81.8  PLT 179  < > 193 223 236 252 275  < > = values in this interval not displayed. Cardiac  Enzymes:  Recent Labs Lab 11/13/16 0501  CKTOTAL 83   BNP: Invalid input(s): POCBNP CBG:  Recent Labs Lab 11/15/16 1146 11/15/16 1652 11/15/16 2204 11/16/16 0755 11/16/16 1204  GLUCAP 246* 226* 235* 199* 167*   D-Dimer No results for input(s): DDIMER in the last 72 hours. Hgb  A1c No results for input(s): HGBA1C in the last 72 hours. Lipid Profile No results for input(s): CHOL, HDL, LDLCALC, TRIG, CHOLHDL, LDLDIRECT in the last 72 hours. Thyroid function studies No results for input(s): TSH, T4TOTAL, T3FREE, THYROIDAB in the last 72 hours.  Invalid input(s): FREET3 Anemia work up No results for input(s): VITAMINB12, FOLATE, FERRITIN, TIBC, IRON, RETICCTPCT in the last 72 hours. Urinalysis    Component Value Date/Time   COLORURINE YELLOW 11/10/2016 0127   APPEARANCEUR CLEAR 11/10/2016 0127   LABSPEC 1.018 11/10/2016 0127   PHURINE 5.0 11/10/2016 0127   GLUCOSEU NEGATIVE 11/10/2016 0127   HGBUR NEGATIVE 11/10/2016 0127   BILIRUBINUR NEGATIVE 11/10/2016 0127   BILIRUBINUR neg 10/24/2014 1152   KETONESUR NEGATIVE 11/10/2016 0127   PROTEINUR NEGATIVE 11/10/2016 0127   UROBILINOGEN 0.2 03/30/2015 2059   NITRITE NEGATIVE 11/10/2016 0127   LEUKOCYTESUR NEGATIVE 11/10/2016 0127   Sepsis Labs Invalid input(s): PROCALCITONIN,  WBC,  LACTICIDVEN Microbiology Recent Results (from the past 240 hour(s))  Culture, blood (Routine x 2)     Status: None   Collection Time: 11/09/16  7:19 PM  Result Value Ref Range Status   Specimen Description BLOOD RIGHT ANTECUBITAL  Final   Special Requests   Final    BOTTLES DRAWN AEROBIC AND ANAEROBIC Blood Culture adequate volume   Culture NO GROWTH 5 DAYS  Final   Report Status 11/14/2016 FINAL  Final  Culture, blood (Routine x 2)     Status: None   Collection Time: 11/09/16  7:19 PM  Result Value Ref Range Status   Specimen Description BLOOD LEFT ANTECUBITAL  Final   Special Requests   Final    BOTTLES DRAWN AEROBIC ONLY Blood  Culture adequate volume   Culture NO GROWTH 5 DAYS  Final   Report Status 11/14/2016 FINAL  Final  Body fluid culture     Status: None   Collection Time: 11/11/16  2:26 PM  Result Value Ref Range Status   Specimen Description FLUID RIGHT HIP  Final   Special Requests JOINT HIP SWAB  Final   Gram Stain   Final    MODERATE WBC PRESENT, PREDOMINANTLY PMN RARE GRAM POSITIVE COCCI IN CLUSTERS IN PAIRS    Culture   Final    MODERATE METHICILLIN RESISTANT STAPHYLOCOCCUS AUREUS   Report Status 11/13/2016 FINAL  Final   Organism ID, Bacteria METHICILLIN RESISTANT STAPHYLOCOCCUS AUREUS  Final      Susceptibility   Methicillin resistant staphylococcus aureus - MIC*    CIPROFLOXACIN >=8 RESISTANT Resistant     ERYTHROMYCIN >=8 RESISTANT Resistant     GENTAMICIN <=0.5 SENSITIVE Sensitive     OXACILLIN >=4 RESISTANT Resistant     TETRACYCLINE <=1 SENSITIVE Sensitive     VANCOMYCIN 1 SENSITIVE Sensitive     TRIMETH/SULFA <=10 SENSITIVE Sensitive     CLINDAMYCIN >=8 RESISTANT Resistant     RIFAMPIN <=0.5 SENSITIVE Sensitive     Inducible Clindamycin NEGATIVE Sensitive     * MODERATE METHICILLIN RESISTANT STAPHYLOCOCCUS AUREUS  Surgical pcr screen     Status: Abnormal   Collection Time: 11/11/16  8:53 PM  Result Value Ref Range Status   MRSA, PCR POSITIVE (A) NEGATIVE Final    Comment: RESULT CALLED TO, READ BACK BY AND VERIFIED WITH: C BOKIAGON,RN  11/11/16 MKELLY,MLT    Staphylococcus aureus POSITIVE (A) NEGATIVE Final    Comment:        The Xpert SA Assay (FDA approved for NASAL specimens in patients over 45 years of age),  is one component of a comprehensive surveillance program.  Test performance has been validated by [facility] for patients greater than or equal to 33 year old. It is not intended to diagnose infection nor to guide or monitor treatment.   Anaerobic culture     Status: None (Preliminary result)   Collection Time: 11/12/16  5:33 PM  Result Value Ref Range  Status   Specimen Description WOUND RIGHT HIP  Final   Special Requests PATIENT ON FOLLOWING ANCEF  Final   Culture   Final    NO ANAEROBES ISOLATED; CULTURE IN PROGRESS FOR 5 DAYS   Report Status PENDING  Incomplete  Aerobic Culture (superficial specimen)     Status: None   Collection Time: 11/12/16  5:33 PM  Result Value Ref Range Status   Specimen Description WOUND RIGHT HIP  Final   Special Requests PATIENT ON FOLLOWING ANCEF  Final   Gram Stain   Final    ABUNDANT WBC PRESENT,BOTH PMN AND MONONUCLEAR FEW GRAM POSITIVE COCCI IN CLUSTERS    Culture   Final    MODERATE METHICILLIN RESISTANT STAPHYLOCOCCUS AUREUS   Report Status 11/15/2016 FINAL  Final   Organism ID, Bacteria METHICILLIN RESISTANT STAPHYLOCOCCUS AUREUS  Final      Susceptibility   Methicillin resistant staphylococcus aureus - MIC*    CIPROFLOXACIN >=8 RESISTANT Resistant     ERYTHROMYCIN >=8 RESISTANT Resistant     GENTAMICIN <=0.5 SENSITIVE Sensitive     OXACILLIN >=4 RESISTANT Resistant     TETRACYCLINE >=16 RESISTANT Resistant     VANCOMYCIN <=0.5 SENSITIVE Sensitive     TRIMETH/SULFA <=10 SENSITIVE Sensitive     CLINDAMYCIN >=8 RESISTANT Resistant     RIFAMPIN <=0.5 SENSITIVE Sensitive     Inducible Clindamycin NEGATIVE Sensitive     * MODERATE METHICILLIN RESISTANT STAPHYLOCOCCUS AUREUS   Time coordinating discharge: 32 minutes  SIGNED:  Standley Dakins, MD  [facility] 11/16/2016, 1:58 PM Pager [phone]   If 7PM-7AM, please contact night-coverage [email or web address] Password [email or web address]

## 2016-11-16 NOTE — Care Management Note (Signed)
Case Management Note  Patient Details  Name: Ryan Shaw MRN: [id number] Date of Birth: 06-06-1966  Subjective/Objective:                    Action/Plan: Pt discharging home with self care. Pt with orders for cane. Pt without insurance. CM consulted [facility] DME to provide patient a cane under charity services.  Pt states he has no transportation home. CM consulted CSW and they are going to provide the patient a bus pass.  Pt states his pants got sent down to the laundry. CM consulted CSW and both were able to locate the patient a pair of jeans however they did not fit. CM informed the charge RN on the unit that patient would require a pair of scrub pants for discharge.  Pt without insurance. Pt is seen at the [facility] and plans to continue to follow with them for his PCP and medications. Pt is without funds for his medications today. CM spoke to [facility] pharmacy and pt will receive his meds today and his copay will go on his bill.   Expected Discharge Date:  11/16/16               Expected Discharge Plan:  Home w Home Health Services  In-House Referral:  Clinical Social Work  Discharge planning Services  CM Consult, Medication Assistance, Indigent Health Clinic  Post Acute Care Choice:  Durable Medical Equipment Choice offered to:  NA, Patient  DME Arranged:  [facility] DME Agency:  [facility]  HH Arranged:    [facility] Agency:     Status of Service:     If discussed at [employer] of Stay Meetings, dates discussed:    Additional Comments:  Kermit Balo, RN 11/16/2016, 3:42 PM

## 2016-11-16 NOTE — Progress Notes (Addendum)
Orders received for pt discharge.  Discharge summary printed and reviewed with pt.  Explained medication regimen, and pt had no further questions at this time.  IV removed and site remains clean, dry, intact. Pt was already non-telemetry.  Pt in stable condition and awaiting transport. 

## 2016-11-16 NOTE — Progress Notes (Addendum)
Physical Therapy Treatment Patient Details Name: Ryan Shaw MRN: [id number] DOB: 12-07-1965 Today's Date: 11/16/2016    History of Present Illness  Ryan Shaw is a 51 y.o. male with history of diabetes mellitus type 2, hypertension, hyperlipidemia presents to the ER with increasing pain over the amputation site of the left great toe. Patient had amputation about 6 weeks ago for infected toe. Patient had recently followed up with Dr. Lajoyce Corners last week. Last 2 days patient has been experiencing increasing pain with subjective feeling of fever and chills.  pt s/p R hip abscess I and D with penrose.    PT Comments    Pt ambulated 150' with RW, distance limited by R hip pain. Performed RLE strengthening exercises. Pt was tearful at end of session. He verbalized multiple stressors including not knowing how he's going to pay for his medication or how he will get home. He stated he was homeless in [place] prior to moving here a month ago. 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 Pt would benefit from social work consult (RN notified). Chaplain visit requested.    Follow Up Recommendations  No PT follow up     Equipment Recommendations  Cane    Recommendations for Other Services       Precautions / Restrictions Precautions Precautions: Fall Restrictions Weight Bearing Restrictions: No    Mobility  Bed Mobility Overal bed mobility: Modified Independent                Transfers Overall transfer level: Modified independent Equipment used: Rolling walker (2 wheeled) Transfers: Sit to/from [employer] Sit to Stand: Modified independent (Device/Increase time)         General transfer comment: steady, good hand placement  Ambulation/Gait Ambulation/Gait assistance: Supervision Ambulation Distance (Feet): 150 Feet Assistive device: Rolling walker (2 wheeled) Gait Pattern/deviations: Step-through  pattern;Decreased step length - right;Decreased step length - left;Decreased stride length;Antalgic;Trunk flexed Gait velocity: decreased   General Gait Details: VCs for positioning in RW and posture, no LOB, distance limited by R hip pain   Stairs            Wheelchair Mobility    Modified Rankin (Stroke Patients Only)       Balance Overall balance assessment: Needs assistance Sitting-balance support: No upper extremity supported Sitting balance-Leahy Scale: Good     Standing balance support: Single extremity supported;No upper extremity supported Standing balance-Leahy Scale: Fair                              Cognition Arousal/Alertness: Awake/alert Behavior During Therapy: WFL for tasks assessed/performed Overall Cognitive Status: Within Functional Limits for tasks assessed                                        Exercises General Exercises - Lower Extremity Ankle Circles/Pumps: AROM;Both;10 reps;Seated Long Arc Quad: AROM;Right;5 reps;Seated Hip Flexion/Marching: AROM;Right;5 reps;Seated    General Comments        Pertinent Vitals/Pain Pain Score: 9  Pain Location: R hip with walking Pain Descriptors / Indicators: Aching;Burning;Grimacing;Sharp;Sore Pain Intervention(s): Limited activity within patient's tolerance;Monitored during session;Premedicated before session    Home Living                      Prior Function  PT Goals (current goals can now be found in the care plan section) Acute Rehab PT Goals Patient Stated Goal: to be able to walk, care for his 4 grandkids PT Goal Formulation: With patient Time For Goal Achievement: 11/20/16 Potential to Achieve Goals: Good Progress towards PT goals: Progressing toward goals    Frequency    Min 3X/week      PT Plan Current plan remains appropriate    Co-evaluation             End of Session Equipment Utilized During Treatment: Gait  belt Activity Tolerance: Patient limited by pain Patient left: in bed;with call bell/phone within reach Nurse Communication: Mobility status PT Visit Diagnosis: Other abnormalities of gait and mobility (R26.89);Pain;Muscle weakness (generalized) (M62.81) Pain - Right/Left: Right Pain - part of body: Hip     Time: 1137-1206 PT Time Calculation (min) (ACUTE ONLY): 29 min  Charges:  $Gait Training: 8-22 mins $Therapeutic Exercise: 8-22 mins                    G Codes:          Tamala Ser 11/16/2016, 12:16 PM [phone]

## 2016-11-17 LAB — ANAEROBIC CULTURE

## 2016-11-17 MED FILL — SULFAMETHOXAZOLE/TMP DS TAB: 800-160 | 21 days supply | Qty: 42 | Fill #0

## 2016-11-18 ENCOUNTER — Inpatient Hospital Stay: Payer: Medicaid Other | Admitting: Family Medicine

## 2016-11-24 ENCOUNTER — Ambulatory Visit (INDEPENDENT_AMBULATORY_CARE_PROVIDER_SITE_OTHER): Payer: Self-pay | Admitting: Orthopedic Surgery

## 2016-11-24 ENCOUNTER — Encounter (INDEPENDENT_AMBULATORY_CARE_PROVIDER_SITE_OTHER): Payer: Self-pay | Admitting: Orthopedic Surgery

## 2016-11-24 ENCOUNTER — Encounter (INDEPENDENT_AMBULATORY_CARE_PROVIDER_SITE_OTHER): Payer: Self-pay

## 2016-11-24 VITALS — Ht 78.0 in | Wt 242.0 lb

## 2016-11-24 DIAGNOSIS — M71051 Abscess of bursa, right hip: Secondary | ICD-10-CM

## 2016-11-24 DIAGNOSIS — Z89412 Acquired absence of left great toe: Secondary | ICD-10-CM

## 2016-11-24 NOTE — Progress Notes (Signed)
Office Visit Note   Patient: Ryan Shaw           Date of Birth: 01-15-1966           MRN: [id number] Visit Date: 11/24/2016              Requested by: Jaclyn Shaggy, MD [address] [place], [place] [address] PCP: Jaclyn Shaggy, MD  Chief Complaint  Patient presents with  . Right Hip - Routine Post Op    11/12/16 right hip I and D      HPI: Patient presents with follow-up status post great toe amputation on the left which patient states has been draining recently as well as irrigation debridement for bursal abscess right hip this did not communicate to bone or joint. Patient is 2 weeks out. He is currently on Bactrim DS twice a day he states that he does have some pain.  Assessment & Plan: Visit Diagnoses:  1. Amputee, great toe, left (HCC)   2. Abscess of bursa of right hip     Plan: We will harvest the sutures today he will wash this with soap and water daily we will give him some gauze for dressing changes with gauze and tape dressing changes daily. Patient will need diabetic shoe wear he is given a prescription for [employer] for extra-depth shoes custom orthotics spacer for the left foot.  Follow-Up Instructions: Return in about 2 weeks (around 12/08/2016).   Ortho Exam  Patient is alert, oriented, no adenopathy, well-dressed, normal affect, normal respiratory effort. Patient does have an antalgic gait. Examination the left foot shows a well-healed great toe amputation there is no cellulitis no drainage no odor no signs of infection there is no tenderness to palpation. Examination of right hip patient does have a little bit of clear serosanguineous drainage there is no purulence the Penrose drains have been removed. There is no fluctuance no signs of recurrent infection.  Imaging: No results found.  Labs: Lab Results  Component Value Date   HGBA1C 12.1 (H) 11/12/2016   HGBA1C 13.5 10/08/2016   HGBA1C 7.60 08/01/2015   ESRSEDRATE 57 (H) 11/12/2016   ESRSEDRATE 30  (H) 11/09/2016   ESRSEDRATE 41 (H) 10/02/2016   CRP 5.7 (H) 11/12/2016   CRP 2.3 (H) 11/09/2016   CRP <0.80 10/02/2016   LABURIC 5.1 11/10/2016   REPTSTATUS 11/17/2016 FINAL 11/12/2016   REPTSTATUS 11/15/2016 FINAL 11/12/2016   GRAMSTAIN  11/12/2016    ABUNDANT WBC PRESENT,BOTH PMN AND MONONUCLEAR FEW GRAM POSITIVE COCCI IN CLUSTERS    CULT NO ANAEROBES ISOLATED 11/12/2016   CULT  11/12/2016    MODERATE METHICILLIN RESISTANT STAPHYLOCOCCUS AUREUS   LABORGA METHICILLIN RESISTANT STAPHYLOCOCCUS AUREUS 11/12/2016    Orders:  No orders of the defined types were placed in this encounter.  No orders of the defined types were placed in this encounter.    Procedures: No procedures performed  Clinical Data: No additional findings.  ROS:  All other systems negative, except as noted in the HPI. Review of Systems  Objective: Vital Signs: Ht  (1.981 m)   Wt 242 lb (109.8 kg)   BMI 27.97 kg/m   Specialty Comments:  No specialty comments available.  PMFS History: Patient Active Problem List   Diagnosis Date Noted  . MRSA infection   . Abscess of bursa of right hip   . Cellulitis of left lower extremity   . Fever   . Right hip pain   . Acute right ankle pain   .  Idiopathic chronic gout of left ankle without tophus   . Amputee, great toe, left (HCC) 10/08/2016  . Diabetic foot ulcer (HCC) 10/08/2016  . Achilles tendon contracture, left   . 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 Acquired contracture of Achilles tendon, left   . Essential hypertension 10/02/2016  . CKD (chronic kidney disease), stage III 10/02/2016  . Bilateral chronic knee pain 07/09/2015  . Chronic osteomyelitis of left foot (HCC) 02/16/2015  . Type 2 diabetes mellitus with diabetic neuropathy, with long-term current use of insulin (HCC) 11/01/2014  . Diabetic peripheral neuropathy (HCC) 10/25/2014   Past Medical History:  Diagnosis Date  . Arthritis   . Diabetes mellitus  without complication (HCC)   . Edema   . Hypercholesteremia   . Hypertension   . Neuropathy     Family History  Problem Relation Age of Onset  . Cancer Mother   . Aneurysm Father     brain    Past Surgical History:  Procedure Laterality Date  . AMPUTATION Right 02/17/2015   Procedure: SECOND RAY AMPUTATION;  Surgeon: Kathryne Hitch, MD;  Location[facility];  Service: Orthopedics;  Laterality: Right;  . AMPUTATION Left 10/04/2016   Procedure: GREAT TOE AMPUTATION GASTROCNEMIA RESECTION;  Surgeon: Nadara Mustard, MD;  Location: [facility] OR;  Service: Orthopedics;  Laterality: Left;  . INCISION AND DRAINAGE HIP Right 11/12/2016   Procedure: IRRIGATION AND DEBRIDEMENT RIGHT HIP;  Surgeon: Nadara Mustard, MD;  Location: [facility] OR;  Service: Orthopedics;  Laterality: Right;   Social History   Occupational History  . Not on file.   Social History Main Topics  . Smoking status: Current Every Day Smoker    Packs/day: 0.25    Types: Cigarettes  . Smokeless tobacco: Never Used     Comment: 8 cigs daily  . Alcohol use No     Comment: patient states he quit drinking since he has been sick  . Drug use: No     Comment: 2 weeks  . Sexual activity: Not on file

## 2016-11-25 ENCOUNTER — Other Ambulatory Visit: Payer: Self-pay | Admitting: Family Medicine

## 2016-11-25 ENCOUNTER — Ambulatory Visit: Payer: Medicaid Other | Attending: Family Medicine | Admitting: Family Medicine

## 2016-11-25 ENCOUNTER — Encounter: Payer: Self-pay | Admitting: Family Medicine

## 2016-11-25 VITALS — BP 89/57 | HR 95 | Temp 97.8°F | Wt 238.4 lb

## 2016-11-25 DIAGNOSIS — I10 Essential (primary) hypertension: Secondary | ICD-10-CM | POA: Insufficient documentation

## 2016-11-25 DIAGNOSIS — Z794 Long term (current) use of insulin: Secondary | ICD-10-CM | POA: Diagnosis not present

## 2016-11-25 DIAGNOSIS — E1140 Type 2 diabetes mellitus with diabetic neuropathy, unspecified: Secondary | ICD-10-CM | POA: Diagnosis not present

## 2016-11-25 DIAGNOSIS — A4902 Methicillin resistant Staphylococcus aureus infection, unspecified site: Secondary | ICD-10-CM | POA: Diagnosis not present

## 2016-11-25 DIAGNOSIS — M71051 Abscess of bursa, right hip: Secondary | ICD-10-CM

## 2016-11-25 DIAGNOSIS — Z89421 Acquired absence of other right toe(s): Secondary | ICD-10-CM | POA: Insufficient documentation

## 2016-11-25 DIAGNOSIS — E1142 Type 2 diabetes mellitus with diabetic polyneuropathy: Secondary | ICD-10-CM | POA: Diagnosis not present

## 2016-11-25 DIAGNOSIS — Z89412 Acquired absence of left great toe: Secondary | ICD-10-CM | POA: Insufficient documentation

## 2016-11-25 DIAGNOSIS — Z79899 Other long term (current) drug therapy: Secondary | ICD-10-CM | POA: Diagnosis not present

## 2016-11-25 LAB — POCT GLYCOSYLATED HEMOGLOBIN (HGB A1C): Hemoglobin A1C: 11.3

## 2016-11-25 LAB — GLUCOSE, POCT (MANUAL RESULT ENTRY): POC Glucose: 176 mg/dl — AB (ref 70–99)

## 2016-11-25 MED ORDER — ATORVASTATIN CALCIUM 40 MG PO TABS: 40.0000 mg | ORAL_TABLET | Freq: Every day | ORAL | 5 refills | Status: DC

## 2016-11-25 MED ORDER — INSULIN DETEMIR 100 UNIT/ML ~~LOC~~ SOLN: 30.0000 [IU] | Freq: Two times a day (BID) | SUBCUTANEOUS | 5 refills | Status: DC

## 2016-11-25 MED ORDER — INSULIN ASPART 100 UNIT/ML FLEXPEN: PEN_INJECTOR | SUBCUTANEOUS | 5 refills | Status: DC

## 2016-11-25 MED ORDER — POLYETHYLENE GLYCOL 3350 17 GM/SCOOP PO POWD: 17.0000 g | Freq: Two times a day (BID) | ORAL | 1 refills | Status: AC | PRN

## 2016-11-25 MED ORDER — PREGABALIN 100 MG PO CAPS: 100.0000 mg | ORAL_CAPSULE | Freq: Two times a day (BID) | ORAL | 5 refills | Status: DC

## 2016-11-25 MED FILL — !NOVOLOG 100UNITS/ML VIAL: 100/ML | 27 days supply | Qty: 10 | Fill #0

## 2016-11-25 MED FILL — !LEVEMIR 100 UNITS/ML VIAL: 100/ML | 29 days supply | Qty: 20 | Fill #0

## 2016-11-25 MED FILL — ?ATORVASTATIN 40MG TABLET: 40 | 30 days supply | Qty: 30 | Fill #0

## 2016-11-25 MED FILL — POLYETHYLENE GLYCOL 3350: 7 days supply | Qty: 255 | Fill #0

## 2016-11-25 NOTE — Progress Notes (Signed)
Subjective:  Patient ID: Ryan Shaw, male    DOB: Feb 11, 1966  Age: 51 y.o. MRN: [id number]  CC: Hospitalization Follow-up   HPI Ryan Shaw is a 51 year old male with a history of hypertension, type 2 diabetes mellitus (A1c 11.3 which is down from 13.5) diabetic neuropathy, hyperlipidemia, right second toe amputation, left great toe amputation in 10/2016 due to left great toe osteomyelitis with recent hospitalization for irrigation and debridement of right hip bursal abscess.  He had presented with right hip pain and was found to have a right hip abscess which did not communicate to bone or joint for which he was taken to the OR for incision and drainage on 11/12/16 by Dr. Sharol Given. Cultures grew multidrug resistant MRSA for which he was placed on antibiotics. Postoperative course was uneventful and he was subsequently discharged to follow-up with Dr. Sharol Given and infectious disease.  He was seen by orthopedics yesterday and had removal of sutures. He denies fevers and has been tolerating his antibiotics.  He does not have his blood sugar log with him but states he has been compliant with his 50 units of Levemir daily but has not been compliant with NovoLog. Blood sugars have been in the 170-250 range.  Past Medical History:  Diagnosis Date  . Arthritis   . Diabetes mellitus without complication ([place])   . Edema   . Hypercholesteremia   . Hypertension   . Neuropathy     Past Surgical History:  Procedure Laterality Date  . AMPUTATION Right 02/17/2015   Procedure: SECOND RAY AMPUTATION;  Surgeon: [name], MD;  Location: [place];  Service: Orthopedics;  Laterality: Right;  . AMPUTATION Left 10/04/2016   Procedure: GREAT TOE AMPUTATION GASTROCNEMIA RESECTION;  Surgeon: Newt Minion, MD;  Location: [place];  Service: Orthopedics;  Laterality: Left;  . INCISION AND DRAINAGE HIP Right 11/12/2016   Procedure: IRRIGATION AND DEBRIDEMENT RIGHT HIP;  Surgeon: Newt Minion, MD;  Location:  [place];  Service: Orthopedics;  Laterality: Right;     Outpatient Medications Prior to Visit  Medication Sig Dispense Refill  . acetaminophen (TYLENOL) 500 MG tablet Take 1,000 mg by mouth every 6 (six) hours as needed for headache (pain).    Marland Kitchen lisinopril (PRINIVIL,ZESTRIL) 5 MG tablet Take 1 tablet (5 mg total) by mouth daily. (Patient taking differently: Take 5 mg by mouth at bedtime. ) 30 tablet 3  . sildenafil (VIAGRA) 25 MG tablet Take 1 tablet (25 mg total) by mouth daily as needed for erectile dysfunction. (Patient taking differently: Take 100 mg by mouth daily as needed for erectile dysfunction. ) 30 tablet 3  . sulfamethoxazole-trimethoprim (BACTRIM DS,SEPTRA DS) 800-160 MG tablet Take 1 tablet by mouth every 12 (twelve) hours. 42 tablet 0  . amLODipine (NORVASC) 10 MG tablet Take 10 mg by mouth at bedtime.     Marland Kitchen atorvastatin (LIPITOR) 40 MG tablet Take 40 mg by mouth at bedtime.     . insulin aspart (NOVOLOG FLEXPEN) 100 UNIT/ML FlexPen Inject 10 Units into the skin 3 (three) times daily with meals. 15 mL 11  . insulin detemir (LEVEMIR) 100 UNIT/ML injection Inject 0.5 mLs (50 Units total) into the skin at bedtime. 30 mL 3  . metoprolol tartrate (LOPRESSOR) 25 MG tablet Take 25 mg by mouth 2 (two) times daily.    . pregabalin (LYRICA) 100 MG capsule Take 1 capsule (100 mg total) by mouth 3 (three) times daily. (Patient taking differently: Take 100 mg by mouth 2 (two) times  daily. ) 270 capsule 3   No facility-administered medications prior to visit.     ROS Review of Systems Constitutional: Negative for activity change and appetite change.  HENT: Negative for sinus pressure and sore throat.   Eyes: Negative for visual disturbance.  Respiratory: Negative for cough, chest tightness and shortness of breath.   Cardiovascular: Negative for chest pain and leg swelling.  Gastrointestinal: Negative for abdominal distention, abdominal pain, constipation and diarrhea.  Endocrine: Negative.    Genitourinary: Negative for dysuria.  Musculoskeletal: see hpi Skin: Positive for wound. Negative for rash.  Allergic/Immunologic: Negative.   Neurological: Positive for numbness. Negative for weakness and light-headedness.  Psychiatric/Behavioral: Negative for dysphoric mood and suicidal ideas  Objective:  BP (!) 89/57   Pulse 95   Temp 97.8 F (36.6 C) (Oral)   Wt 238 lb 6.4 oz (108.1 kg)   SpO2 100%   BMI 27.55 kg/m   BP/Weight 11/25/2016 11/24/2016 2/68/3419  Systolic BP 89 - 622  Diastolic BP 57 - 69  Wt. (Lbs) 238.4 242 -  BMI 27.55 27.97 -      Physical Exam Constitutional: He is oriented to person, place, and time. He appears well-developed and well-nourished.  Cardiovascular: Normal rate, normal heart sounds and intact distal pulses.   No murmur heard. Pulmonary/Chest: Effort normal and breath sounds normal. He has no wheezes. He has no rales. He exhibits no tenderness.  Abdominal: Soft. Bowel sounds are normal. He exhibits no distension and no mass. There is no tenderness.  Musculoskeletal:  Left foot with amputation of great toe, healed. Right second toe, dry scaly skin on foot with  Ulcer. Right hip surgical incision with slight purulent discharge.  Neurological: He is alert and oriented to person, place, and time. Psych: Normal  Lab Results  Component Value Date   HGBA1C 11.3 11/25/2016    CMP Latest Ref Rng & Units 11/16/2016 11/15/2016 11/14/2016  Glucose 65 - 99 mg/dL 242(H) 266(H) 206(H)  BUN 6 - 20 mg/dL 16 14 10   Creatinine 0.61 - 1.24 mg/dL 1.10 1.17 1.23  Sodium 135 - 145 mmol/L 133(L) 133(L) 134(L)  Potassium 3.5 - 5.1 mmol/L 4.7 4.2 4.0  Chloride 101 - 111 mmol/L 103 100(L) 99(L)  CO2 22 - 32 mmol/L 23 25 26   Calcium 8.9 - 10.3 mg/dL 8.9 8.9 9.0  Total Protein 6.5 - 8.1 g/dL 6.8 6.5 6.8  Total Bilirubin 0.3 - 1.2 mg/dL 0.5 0.7 0.6  Alkaline Phos 38 - 126 U/L 345(H) 334(H) 287(H)  AST 15 - 41 U/L 60(H) 64(H) 39  ALT 17 - 63 U/L 60 56 40     CBC    Component Value Date/Time   WBC 3.8 (L) 11/16/2016 0438   RBC 4.06 (L) 11/16/2016 0438   HGB 10.6 (L) 11/16/2016 0438   HCT 33.2 (L) 11/16/2016 0438   PLT 275 11/16/2016 0438   MCV 81.8 11/16/2016 0438   MCH 26.1 11/16/2016 0438   MCHC 31.9 11/16/2016 0438   RDW 12.9 11/16/2016 0438   LYMPHSABS 1.6 11/09/2016 1912   MONOABS 0.8 11/09/2016 1912   EOSABS 0.1 11/09/2016 1912   BASOSABS 0.0 11/09/2016 1912     Assessment & Plan:   1. Type 2 diabetes mellitus with diabetic neuropathy, with long-term current use of insulin (HCC) Controlled with A1c of 11.3 Increase Levemir from 50 units daily to 30 units twice daily Switched from fixed NovoLog regimen to sliding scale Review blood sugar log at next visit - POCT glucose (manual  entry) - POCT glycosylated hemoglobin (Hb A1C) - CMP14+EGFR - Lipid panel  2. Diabetic polyneuropathy associated with type 2 diabetes mellitus ([place]) Uncontrolled due to running out - pregabalin (LYRICA) 100 MG capsule; Take 1 capsule (100 mg total) by mouth 2 (two) times daily.  Dispense: 60 capsule; Refill: 5  3. MRSA infection Currently on Bactrim - CBC with Differential/Platelet  4. Essential hypertension Blood pressure is on the low side Discontinued amlodipine and metoprolol We'll resume lisinopril if blood pressure holds up at next visit  5. Amputee, great toe, left (HCC) Stable  6. Abscess of bursa of right hip Dressing change performed in the clinic   Meds ordered this encounter  Medications  . pregabalin (LYRICA) 100 MG capsule    Sig: Take 1 capsule (100 mg total) by mouth 2 (two) times daily.    Dispense:  60 capsule    Refill:  5  . atorvastatin (LIPITOR) 40 MG tablet    Sig: Take 1 tablet (40 mg total) by mouth at bedtime.    Dispense:  30 tablet    Refill:  5  . insulin detemir (LEVEMIR) 100 UNIT/ML injection    Sig: Inject 0.3 mLs (30 Units total) into the skin 2 (two) times daily.    Dispense:  30 mL     Refill:  5    Discontinue previous dose  . insulin aspart (NOVOLOG FLEXPEN) 100 UNIT/ML FlexPen    Sig: 0-12 units 3 times daily before meals as per sliding scale.    Dispense:  15 mL    Refill:  5    Follow-up: Return in about 1 month (around 12/25/2016) for Follow-up on diabetes mellitus.   Arnoldo Morale MD

## 2016-11-25 NOTE — Patient Instructions (Signed)
Diabetes Mellitus and Food It is important for you to manage your blood sugar (glucose) level. Your blood glucose level can be greatly affected by what you eat. Eating healthier foods in the appropriate amounts throughout the day at about the same time each day will help you control your blood glucose level. It can also help slow or prevent worsening of your diabetes mellitus. Healthy eating may even help you improve the level of your blood pressure and reach or maintain a healthy weight. General recommendations for healthful eating and cooking habits include:  Eating meals and snacks regularly. Avoid going long periods of time without eating to lose weight.  Eating a diet that consists mainly of plant-based foods, such as fruits, vegetables, nuts, legumes, and whole grains.  Using low-heat cooking methods, such as baking, instead of high-heat cooking methods, such as deep frying.  Work with your dietitian to make sure you understand how to use the Nutrition Facts information on food labels. How can food affect me? Carbohydrates Carbohydrates affect your blood glucose level more than any other type of food. Your dietitian will help you determine how many carbohydrates to eat at each meal and teach you how to count carbohydrates. Counting carbohydrates is important to keep your blood glucose at a healthy level, especially if you are using insulin or taking certain medicines for diabetes mellitus. Alcohol Alcohol can cause sudden decreases in blood glucose (hypoglycemia), especially if you use insulin or take certain medicines for diabetes mellitus. Hypoglycemia can be a life-threatening condition. Symptoms of hypoglycemia (sleepiness, dizziness, and disorientation) are similar to symptoms of having too much alcohol. If your health care provider has given you approval to drink alcohol, do so in moderation and use the following guidelines:  Women should not have more than one drink per day, and men  should not have more than two drinks per day. One drink is equal to: ? 12 oz of beer. ? 5 oz of wine. ? 1 oz of hard liquor.  Do not drink on an empty stomach.  Keep yourself hydrated. Have water, diet soda, or unsweetened iced tea.  Regular soda, juice, and other mixers might contain a lot of carbohydrates and should be counted.  What foods are not recommended? As you make food choices, it is important to remember that all foods are not the same. Some foods have fewer nutrients per serving than other foods, even though they might have the same number of calories or carbohydrates. It is difficult to get your body what it needs when you eat foods with fewer nutrients. Examples of foods that you should avoid that are high in calories and carbohydrates but low in nutrients include:  Trans fats (most processed foods list trans fats on the Nutrition Facts label).  Regular soda.  Juice.  Candy.  Sweets, such as cake, pie, doughnuts, and cookies.  Fried foods.  What foods can I eat? Eat nutrient-rich foods, which will nourish your body and keep you healthy. The food you should eat also will depend on several factors, including:  The calories you need.  The medicines you take.  Your weight.  Your blood glucose level.  Your blood pressure level.  Your cholesterol level.  You should eat a variety of foods, including:  Protein. ? Lean cuts of meat. ? Proteins low in saturated fats, such as fish, egg whites, and beans. Avoid processed meats.  Fruits and vegetables. ? Fruits and vegetables that may help control blood glucose levels, such as apples,   mangoes, and yams.  Dairy products. ? Choose fat-free or low-fat dairy products, such as milk, yogurt, and cheese.  Grains, bread, pasta, and rice. ? Choose whole grain products, such as multigrain bread, whole oats, and brown rice. These foods may help control blood pressure.  Fats. ? Foods containing healthful fats, such as  nuts, avocado, olive oil, canola oil, and fish.  Does everyone with diabetes mellitus have the same meal plan? Because every person with diabetes mellitus is different, there is not one meal plan that works for everyone. It is very important that you meet with a dietitian who will help you create a meal plan that is just right for you. This information is not intended to replace advice given to you by your health care provider. Make sure you discuss any questions you have with your health care provider. Document Released: 04/16/2005 Document Revised: 12/26/2015 Document Reviewed: 06/16/2013 Elsevier Interactive Patient Education  2017 [employer]  

## 2016-11-25 NOTE — Progress Notes (Signed)
Pt needs refills on all medications.  °

## 2016-11-26 ENCOUNTER — Ambulatory Visit: Payer: Self-pay | Attending: Family Medicine

## 2016-11-26 LAB — CMP14+EGFR
ALBUMIN: 4.2 g/dL (ref 3.5–5.5)
ALT: 34 IU/L (ref 0–44)
AST: 27 IU/L (ref 0–40)
Albumin/Globulin Ratio: 1.3 (ref 1.2–2.2)
Alkaline Phosphatase: 231 IU/L — ABNORMAL HIGH (ref 39–117)
BUN / CREAT RATIO: 25 — AB (ref 9–20)
BUN: 46 mg/dL — AB (ref 6–24)
Bilirubin Total: <0.2 mg/dL (ref 0.0–1.2)
CALCIUM: 9.6 mg/dL (ref 8.7–10.2)
CO2: 22 mmol/L (ref 18–29)
Chloride: 101 mmol/L (ref 96–106)
Creatinine, Ser: 1.86 mg/dL — ABNORMAL HIGH (ref 0.76–1.27)
GFR, EST AFRICAN AMERICAN: 48 mL/min/{1.73_m2} — AB (ref >59)
GFR, EST NON AFRICAN AMERICAN: 41 mL/min/{1.73_m2} — AB (ref >59)
GLUCOSE: 159 mg/dL — AB (ref 65–99)
Globulin, Total: 3.3 g/dL (ref 1.5–4.5)
Potassium: 5.6 mmol/L — ABNORMAL HIGH (ref 3.5–5.2)
Sodium: 137 mmol/L (ref 134–144)
TOTAL PROTEIN: 7.5 g/dL (ref 6.0–8.5)

## 2016-11-26 LAB — CBC WITH DIFFERENTIAL/PLATELET
Basophils Absolute: 0.1 10*3/uL (ref 0.0–0.2)
Basos: 1 %
EOS (ABSOLUTE): 0.2 10*3/uL (ref 0.0–0.4)
EOS: 4 %
HEMOGLOBIN: 12.5 g/dL — AB (ref 13.0–17.7)
Hematocrit: 38.1 % (ref 37.5–51.0)
IMMATURE GRANS (ABS): 0 10*3/uL (ref 0.0–0.1)
IMMATURE GRANULOCYTES: 0 %
LYMPHS ABS: 2.6 10*3/uL (ref 0.7–3.1)
Lymphs: 43 %
MCH: 26.2 pg — AB (ref 26.6–33.0)
MCHC: 32.8 g/dL (ref 31.5–35.7)
MCV: 80 fL (ref 79–97)
MONOS ABS: 0.3 10*3/uL (ref 0.1–0.9)
Monocytes: 6 %
NEUTROS PCT: 46 %
Neutrophils Absolute: 2.8 10*3/uL (ref 1.4–7.0)
Platelets: 553 10*3/uL — ABNORMAL HIGH (ref 150–379)
RBC: 4.77 x10E6/uL (ref 4.14–5.80)
RDW: 13.3 % (ref 12.3–15.4)
WBC: 6 10*3/uL (ref 3.4–10.8)

## 2016-11-26 LAB — LIPID PANEL
CHOL/HDL RATIO: 3.4 ratio (ref 0.0–5.0)
Cholesterol, Total: 110 mg/dL (ref 100–199)
HDL: 32 mg/dL — AB (ref >39)
LDL Calculated: 54 mg/dL (ref 0–99)
Triglycerides: 122 mg/dL (ref 0–149)
VLDL CHOLESTEROL CAL: 24 mg/dL (ref 5–40)

## 2016-11-28 ENCOUNTER — Encounter (HOSPITAL_COMMUNITY): Payer: Self-pay | Admitting: *Deleted

## 2016-11-28 ENCOUNTER — Emergency Department (HOSPITAL_COMMUNITY): Payer: Medicaid Other

## 2016-11-28 ENCOUNTER — Emergency Department (HOSPITAL_BASED_OUTPATIENT_CLINIC_OR_DEPARTMENT_OTHER)
Admission: RE | Admit: 2016-11-28 | Discharge: 2016-11-28 | Disposition: A | Discharge status: Home / Self Care | Payer: Medicaid Other | Source: Ambulatory Visit | Attending: Emergency Medicine | Admitting: Emergency Medicine

## 2016-11-28 ENCOUNTER — Emergency Department (HOSPITAL_COMMUNITY)
Admission: EM | Admit: 2016-11-28 | Discharge: 2016-11-28 | Disposition: A | Discharge status: Home / Self Care | Payer: Medicaid Other | Attending: Emergency Medicine | Admitting: Emergency Medicine

## 2016-11-28 DIAGNOSIS — E1122 Type 2 diabetes mellitus with diabetic chronic kidney disease: Secondary | ICD-10-CM | POA: Diagnosis not present

## 2016-11-28 DIAGNOSIS — R52 Pain, unspecified: Secondary | ICD-10-CM | POA: Diagnosis present

## 2016-11-28 DIAGNOSIS — N183 Chronic kidney disease, stage 3 (moderate): Secondary | ICD-10-CM | POA: Diagnosis not present

## 2016-11-28 DIAGNOSIS — Z79899 Other long term (current) drug therapy: Secondary | ICD-10-CM | POA: Diagnosis not present

## 2016-11-28 DIAGNOSIS — I809 Phlebitis and thrombophlebitis of unspecified site: Secondary | ICD-10-CM | POA: Insufficient documentation

## 2016-11-28 DIAGNOSIS — F17210 Nicotine dependence, cigarettes, uncomplicated: Secondary | ICD-10-CM | POA: Diagnosis not present

## 2016-11-28 DIAGNOSIS — I129 Hypertensive chronic kidney disease with stage 1 through stage 4 chronic kidney disease, or unspecified chronic kidney disease: Secondary | ICD-10-CM | POA: Insufficient documentation

## 2016-11-28 DIAGNOSIS — M79609 Pain in unspecified limb: Secondary | ICD-10-CM

## 2016-11-28 DIAGNOSIS — Z794 Long term (current) use of insulin: Secondary | ICD-10-CM | POA: Diagnosis not present

## 2016-11-28 DIAGNOSIS — E1140 Type 2 diabetes mellitus with diabetic neuropathy, unspecified: Secondary | ICD-10-CM | POA: Diagnosis not present

## 2016-11-28 LAB — URINALYSIS, ROUTINE W REFLEX MICROSCOPIC
Bilirubin Urine: NEGATIVE
Glucose, UA: 150 mg/dL — AB
HGB URINE DIPSTICK: NEGATIVE
Ketones, ur: NEGATIVE mg/dL
LEUKOCYTES UA: NEGATIVE
NITRITE: NEGATIVE
PROTEIN: NEGATIVE mg/dL
SPECIFIC GRAVITY, URINE: 1.016 (ref 1.005–1.030)
pH: 5 (ref 5.0–8.0)

## 2016-11-28 LAB — COMPREHENSIVE METABOLIC PANEL
ALK PHOS: 174 U/L — AB (ref 38–126)
ALT: 38 U/L (ref 17–63)
AST: 22 U/L (ref 15–41)
Albumin: 3.8 g/dL (ref 3.5–5.0)
Anion gap: 7 (ref 5–15)
BILIRUBIN TOTAL: 0.4 mg/dL (ref 0.3–1.2)
BUN: 22 mg/dL — ABNORMAL HIGH (ref 6–20)
CO2: 22 mmol/L (ref 22–32)
Calcium: 9.4 mg/dL (ref 8.9–10.3)
Chloride: 102 mmol/L (ref 101–111)
Creatinine, Ser: 1.18 mg/dL (ref 0.61–1.24)
GFR calc non Af Amer: >60 mL/min (ref >60)
Glucose, Bld: 208 mg/dL — ABNORMAL HIGH (ref 65–99)
Potassium: 5.6 mmol/L — ABNORMAL HIGH (ref 3.5–5.1)
SODIUM: 131 mmol/L — AB (ref 135–145)
Total Protein: 7.3 g/dL (ref 6.5–8.1)

## 2016-11-28 LAB — CBC WITH DIFFERENTIAL/PLATELET
Basophils Absolute: 0 10*3/uL (ref 0.0–0.1)
Basophils Relative: 1 %
EOS ABS: 0.2 10*3/uL (ref 0.0–0.7)
Eosinophils Relative: 4 %
HCT: 37.1 % — ABNORMAL LOW (ref 39.0–52.0)
HEMOGLOBIN: 12 g/dL — AB (ref 13.0–17.0)
LYMPHS ABS: 2.3 10*3/uL (ref 0.7–4.0)
LYMPHS PCT: 43 %
MCH: 26.1 pg (ref 26.0–34.0)
MCHC: 32.3 g/dL (ref 30.0–36.0)
MCV: 80.7 fL (ref 78.0–100.0)
Monocytes Absolute: 0.4 10*3/uL (ref 0.1–1.0)
Monocytes Relative: 6 %
NEUTROS PCT: 46 %
Neutro Abs: 2.6 10*3/uL (ref 1.7–7.7)
Platelets: 476 10*3/uL — ABNORMAL HIGH (ref 150–400)
RBC: 4.6 MIL/uL (ref 4.22–5.81)
RDW: 12.9 % (ref 11.5–15.5)
WBC: 5.5 10*3/uL (ref 4.0–10.5)

## 2016-11-28 LAB — I-STAT CG4 LACTIC ACID, ED: LACTIC ACID, VENOUS: 0.88 mmol/L (ref 0.5–1.9)

## 2016-11-28 MED ORDER — ONDANSETRON 4 MG PO TBDP: 4.0000 mg | ORAL_TABLET | Freq: Three times a day (TID) | ORAL | 0 refills | Status: DC | PRN

## 2016-11-28 MED ORDER — SODIUM CHLORIDE 0.9 % IV BOLUS (SEPSIS)
1000.0000 mL | Freq: Once | INTRAVENOUS | Status: AC
  Administered 2016-11-28: 1000 mL via INTRAVENOUS

## 2016-11-28 NOTE — Discharge Instructions (Addendum)
Take a full aspirin daily. Apply warm compress or warm soaks to the area for 20 minutes three times per day.

## 2016-11-28 NOTE — ED Notes (Signed)
Patient transported to Ultrasound 

## 2016-11-28 NOTE — Progress Notes (Signed)
VASCULAR LAB PRELIMINARY  PRELIMINARY  PRELIMINARY  PRELIMINARY  Left lower extremity venous duplex completed.    Preliminary report:  There is no DVT or SVT noted in the left lower extremity.  Interstitial fluid noted in the calf.  Called results to Dr. Fayrene Fearing, MD  Sherren Kerns, RVT 11/28/2016, 5:03 PM

## 2016-11-28 NOTE — ED Notes (Signed)
Patient transported to X-ray 

## 2016-11-28 NOTE — ED Provider Notes (Signed)
[facility]-EMERGENCY DEPT Provider Note   CSN: [id number] Arrival date & time: 11/28/16  1151     History   Chief Complaint Chief Complaint  Patient presents with  . Generalized Body Aches    HPI Ryan Shaw is a 51 y.o. male.  The history is provided by the patient. No language interpreter was used.   Ryan Shaw is a 51 y.o. male who presents to the Emergency Department complaining of body aches.  He presents for evaluation of nausea and body aches for the last 2 days. He has subjective fevers at home but did not take his to mature. No chest pain, shortness of breath, cough, vomiting, diarrhea. He reports pain all over his body. He also has pain at all of his prior surgical site as well as his left calf. He is currently taking antibodies were prescribed from his last hospitalization but he is not sure what the antibiotic is. Past Medical History:  Diagnosis Date  . Arthritis   . Diabetes mellitus without complication (HCC)   . Edema   . Hypercholesteremia   . Hypertension   . Neuropathy     Patient Active Problem List   Diagnosis Date Noted  . MRSA infection   . Abscess of bursa of right hip   . Cellulitis of left lower extremity   . Fever   . Right hip pain   . Acute right ankle pain   . Idiopathic chronic gout of left ankle without tophus   . Amputee, great toe, left (HCC) 10/08/2016  . Diabetic foot ulcer (HCC) 10/08/2016  . Achilles tendon contracture, left   . 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 Acquired contracture of Achilles tendon, left   . Essential hypertension 10/02/2016  . CKD (chronic kidney disease), stage III 10/02/2016  . Bilateral chronic knee pain 07/09/2015  . Chronic osteomyelitis of left foot (HCC) 02/16/2015  . Type 2 diabetes mellitus with diabetic neuropathy, with long-term current use of insulin (HCC) 11/01/2014  . Diabetic peripheral neuropathy (HCC) 10/25/2014    Past Surgical History:  Procedure Laterality Date    . AMPUTATION Right 02/17/2015   Procedure: SECOND RAY AMPUTATION;  Surgeon: Kathryne Hitch, MD;  Location: [facility] OR;  Service: Orthopedics;  Laterality: Right;  . AMPUTATION Left 10/04/2016   Procedure: GREAT TOE AMPUTATION GASTROCNEMIA RESECTION;  Surgeon: Nadara Mustard, MD;  Location: [facility] OR;  Service: Orthopedics;  Laterality: Left;  . INCISION AND DRAINAGE HIP Right 11/12/2016   Procedure: IRRIGATION AND DEBRIDEMENT RIGHT HIP;  Surgeon: Nadara Mustard, MD;  Location: [facility] OR;  Service: Orthopedics;  Laterality: Right;       Home Medications    Prior to Admission medications   Medication Sig Start Date End Date Taking? Authorizing Provider  acetaminophen (TYLENOL) 500 MG tablet Take 1,000 mg by mouth every 6 (six) hours as needed for headache (pain).   Yes Historical Provider, MD  atorvastatin (LIPITOR) 40 MG tablet Take 1 tablet (40 mg total) by mouth at bedtime. 11/25/16  Yes Jaclyn Shaggy, MD  insulin aspart (NOVOLOG FLEXPEN) 100 UNIT/ML FlexPen 0-12 units 3 times daily before meals as per sliding scale. 11/25/16  Yes Jaclyn Shaggy, MD  insulin detemir (LEVEMIR) 100 UNIT/ML injection Inject 0.3 mLs (30 Units total) into the skin 2 (two) times daily. 11/25/16  Yes Jaclyn Shaggy, MD  lisinopril (PRINIVIL,ZESTRIL) 5 MG tablet Take 1 tablet (5 mg total) by mouth daily. Patient taking differently: Take 5 mg by mouth at  bedtime.  10/08/16  Yes Jaclyn Shaggy, MD  polyethylene glycol powder (GLYCOLAX/MIRALAX) powder Take 17 g by mouth 2 (two) times daily as needed. 11/25/16  Yes Jaclyn Shaggy, MD  pregabalin (LYRICA) 100 MG capsule Take 1 capsule (100 mg total) by mouth 2 (two) times daily. 11/25/16  Yes Jaclyn Shaggy, MD  sildenafil (VIAGRA) 25 MG tablet Take 1 tablet (25 mg total) by mouth daily as needed for erectile dysfunction. Patient taking differently: Take 100 mg by mouth daily as needed for erectile dysfunction.  06/07/15  Yes Quentin Angst, MD  sulfamethoxazole-trimethoprim (BACTRIM  DS,SEPTRA DS) 800-160 MG tablet Take 1 tablet by mouth every 12 (twelve) hours. 11/16/16 12/07/16 Yes Clanford Cyndie Mull, MD  ondansetron (ZOFRAN ODT) 4 MG disintegrating tablet Take 1 tablet (4 mg total) by mouth every 8 (eight) hours as needed for nausea or vomiting. 11/28/16   Tilden Fossa, MD    Family History Family History  Problem Relation Age of Onset  . Cancer Mother   . Aneurysm Father     brain    Social History Social History  Substance Use Topics  . Smoking status: Current Every Day Smoker    Packs/day: 0.25    Types: Cigarettes  . Smokeless tobacco: Never Used     Comment: 8 cigs daily  . Alcohol use No     Comment: patient states he quit drinking since he has been sick     Allergies   Other and Vancomycin   Review of Systems Review of Systems  All other systems reviewed and are negative.    Physical Exam Updated Vital Signs BP 112/88   Pulse 86   Temp 98 F (36.7 C) (Oral)   Resp 16   Ht  (1.981 m)   Wt 247 lb (112 kg)   SpO2 99%   BMI 28.54 kg/m   Physical Exam  Constitutional: He is oriented to person, place, and time. He appears well-developed and well-nourished.  HENT:  Head: Normocephalic and atraumatic.  Cardiovascular: Normal rate and regular rhythm.   No murmur heard. Pulmonary/Chest: Effort normal and breath sounds normal. No respiratory distress.  Abdominal: Soft. There is no tenderness. There is no rebound and no guarding.  Musculoskeletal:  2+ DP pulses in bilateral lower x-rays. Left first toe a dictation with incision site is clean, dry, intact well-healing. The right second toe amputation that is healed. There is a healing wound to the right hip with mild dehiscence, no active drainage. Mild tenderness in the region of the hip. The left lateral calf with a firm nodularity that is tender to palpation. There is no focal fluctuance in this area. No  Cellulitis to the leg.  Neurological: He is alert and oriented to person, place,  and time.  Skin: Skin is warm and dry.  Psychiatric: He has a normal mood and affect. His behavior is normal.  Nursing note and vitals reviewed.    ED Treatments / Results  Labs (all labs ordered are listed, but only abnormal results are displayed) Labs Reviewed  COMPREHENSIVE METABOLIC PANEL - Abnormal; Notable for the following:       Result Value   Sodium 131 (*)    Potassium 5.6 (*)    Glucose, Bld 208 (*)    BUN 22 (*)    Alkaline Phosphatase 174 (*)    All other components within normal limits  CBC WITH DIFFERENTIAL/PLATELET - Abnormal; Notable for the following:    Hemoglobin 12.0 (*)  HCT 37.1 (*)    Platelets 476 (*)    All other components within normal limits  URINALYSIS, ROUTINE W REFLEX MICROSCOPIC - Abnormal; Notable for the following:    Glucose, UA 150 (*)    All other components within normal limits  I-STAT CG4 LACTIC ACID, ED    EKG  EKG Interpretation None       Radiology Dg Chest 2 View  Result Date: 11/28/2016 CLINICAL DATA:  Generalized body aches. EXAM: CHEST  2 VIEW COMPARISON:  11/09/2016 FINDINGS: Cardiomediastinal silhouette is normal. Mediastinal contours appear intact. There is no evidence of focal airspace consolidation, pleural effusion or pneumothorax. Osseous structures are without acute abnormality. Soft tissues are grossly normal. IMPRESSION: No active cardiopulmonary disease. Electronically Signed   By: Ted Mcalpine M.D.   On: 11/28/2016 16:25    Procedures Procedures (including critical care time)  Medications Ordered in ED Medications  sodium chloride 0.9 % bolus 1,000 mL (1,000 mLs Intravenous New Bag/Given 11/28/16 1534)     Initial Impression / Assessment and Plan / ED Course  I have reviewed the triage vital signs and the nursing notes.  Pertinent labs & imaging results that were available during my care of the patient were reviewed by me and considered in my medical decision making (see chart for details).      Patient here for evaluation of body aches, nausea. He was recently in the hospital for an abscess to the right hip. He is currently on antibiotics. He is nontoxic appearing on examination with no evidence of sepsis or serious bacterial infection on examination. His wounds appear to be healing appropriately. Does have some induration and tenderness to the left calf, DVT study is pending. Patient care transferred pending DVT study. Anticipate patient to be discharged home on antiemetics and continuing his current antibiotic therapy with outpatient follow-up and return precautions.  Final Clinical Impressions(s) / ED Diagnoses   Final diagnoses:  Body aches    New Prescriptions New Prescriptions   ONDANSETRON (ZOFRAN ODT) 4 MG DISINTEGRATING TABLET    Take 1 tablet (4 mg total) by mouth every 8 (eight) hours as needed for nausea or vomiting.     Tilden Fossa, MD 11/28/16 [phone]

## 2016-11-28 NOTE — ED Triage Notes (Signed)
Pt c/o generalized body aches & L leg pain post surgery to remove big toe x 7 days ago, no redness or leg swelling noted to L leg, afebrile, A&O x4

## 2016-12-01 ENCOUNTER — Encounter (HOSPITAL_BASED_OUTPATIENT_CLINIC_OR_DEPARTMENT_OTHER): Payer: Medicaid Other | Attending: Surgery

## 2016-12-01 DIAGNOSIS — F17210 Nicotine dependence, cigarettes, uncomplicated: Secondary | ICD-10-CM | POA: Insufficient documentation

## 2016-12-01 DIAGNOSIS — Z89412 Acquired absence of left great toe: Secondary | ICD-10-CM | POA: Insufficient documentation

## 2016-12-01 DIAGNOSIS — Z89421 Acquired absence of other right toe(s): Secondary | ICD-10-CM | POA: Insufficient documentation

## 2016-12-01 DIAGNOSIS — I10 Essential (primary) hypertension: Secondary | ICD-10-CM | POA: Diagnosis not present

## 2016-12-01 DIAGNOSIS — Z09 Encounter for follow-up examination after completed treatment for conditions other than malignant neoplasm: Secondary | ICD-10-CM | POA: Insufficient documentation

## 2016-12-01 DIAGNOSIS — E1140 Type 2 diabetes mellitus with diabetic neuropathy, unspecified: Secondary | ICD-10-CM | POA: Insufficient documentation

## 2016-12-01 DIAGNOSIS — Z8631 Personal history of diabetic foot ulcer: Secondary | ICD-10-CM | POA: Insufficient documentation

## 2016-12-02 ENCOUNTER — Telehealth: Payer: Self-pay

## 2016-12-02 NOTE — Telephone Encounter (Signed)
Writer called patient and discussed lab results.  Patient stated understanding. 

## 2016-12-02 NOTE — Telephone Encounter (Signed)
-----   Message from Jaclyn Shaggy, MD sent at 11/27/2016 12:13 PM EDT ----- Labs reveal kidney function decline likely from poorly controlled Diabetes. Avoid NSAIDS, encourage adherence with medications and Diabetic diet.

## 2016-12-03 ENCOUNTER — Ambulatory Visit (INDEPENDENT_AMBULATORY_CARE_PROVIDER_SITE_OTHER): Payer: Self-pay | Admitting: Orthopedic Surgery

## 2016-12-08 ENCOUNTER — Ambulatory Visit (INDEPENDENT_AMBULATORY_CARE_PROVIDER_SITE_OTHER): Payer: Self-pay | Admitting: Orthopedic Surgery

## 2016-12-08 VITALS — Ht 78.0 in | Wt 247.0 lb

## 2016-12-08 DIAGNOSIS — Z89412 Acquired absence of left great toe: Secondary | ICD-10-CM

## 2016-12-08 DIAGNOSIS — M71051 Abscess of bursa, right hip: Secondary | ICD-10-CM

## 2016-12-08 NOTE — Progress Notes (Signed)
Office Visit Note   Patient: Ryan Shaw           Date of Birth: [birth year]           MRN: [id number] Visit Date: 12/08/2016              Requested by: Jaclyn Shaggy, MD [address] [place], [place] [address] PCP: Jaclyn Shaggy, MD  Chief Complaint  Patient presents with  . Right Hip - Routine Post Op    11/12/16 I&D right hip abscess   . Left Foot - Routine Post Op    10/04/16 left GT amp at [facility] and gastroc recession       HPI: Patient is a 51 year old gentleman presents in follow-up for abscess right greater trochanter bursa amputation of the left great toe as well as previous amputation of the right second toe. Patient try to get some extra depth shoes and orthotics and angular and due to lack of insurance he states he cannot afford $400 shoes. He is finishing up his course of Bactrim DS.  Assessment & Plan: Visit Diagnoses:  1. Amputee, great toe, left (HCC)   2. Abscess of bursa of right hip     Plan: Patient had a superficial blister over the dorsum of the second toe left foot using antibiotic ointment and a Band-Aid. Recommend extra-depth shoes he will need a longer shoe appears that his second toe is pain on the end of the shoe in the left foot. Follow-up as needed.  Follow-Up Instructions: Return if symptoms worsen or fail to improve.   Ortho Exam  Patient is alert, oriented, no adenopathy, well-dressed, normal affect, normal respiratory effort. Examination the right hip incision is well-healed no redness no cellulitis no drainage no tenderness to palpation. The left great toe amputation is well healed he does have a little blister over the DIP joint of the second toe left foot this was debrided and Band-Aid applied. Patient is callus beneath the first metatarsal head of the right foot a 10 blade knife was used. The callus without complications there is no open wound.  Imaging: No results found.  Labs: Lab Results  Component Value Date   HGBA1C 11.3  11/25/2016   HGBA1C 12.1 (H) 11/12/2016   HGBA1C 13.5 10/08/2016   ESRSEDRATE 57 (H) 11/12/2016   ESRSEDRATE 30 (H) 11/09/2016   ESRSEDRATE 41 (H) 10/02/2016   CRP 5.7 (H) 11/12/2016   CRP 2.3 (H) 11/09/2016   CRP <0.80 10/02/2016   LABURIC 5.1 11/10/2016   REPTSTATUS 11/17/2016 FINAL 11/12/2016   REPTSTATUS 11/15/2016 FINAL 11/12/2016   GRAMSTAIN  11/12/2016    ABUNDANT WBC PRESENT,BOTH PMN AND MONONUCLEAR FEW GRAM POSITIVE COCCI IN CLUSTERS    CULT NO ANAEROBES ISOLATED 11/12/2016   CULT  11/12/2016    MODERATE METHICILLIN RESISTANT STAPHYLOCOCCUS AUREUS   LABORGA METHICILLIN RESISTANT STAPHYLOCOCCUS AUREUS 11/12/2016    Orders:  No orders of the defined types were placed in this encounter.  No orders of the defined types were placed in this encounter.    Procedures: No procedures performed  Clinical Data: No additional findings.  ROS:  All other systems negative, except as noted in the HPI. Review of Systems  Objective: Vital Signs: Ht 6\' 6"  (1.981 m)   Wt 247 lb (112 kg)   BMI 28.54 kg/m   Specialty Comments:  No specialty comments available.  PMFS History: Patient Active Problem List   Diagnosis Date Noted  . MRSA infection   . Abscess of bursa  of right hip   . Cellulitis of left lower extremity   . Fever   . Right hip pain   . Acute right ankle pain   . Idiopathic chronic gout of left ankle without tophus   . Amputee, great toe, left (HCC) 10/08/2016  . Diabetic foot ulcer (HCC) 10/08/2016  . Achilles tendon contracture, left   . 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 Acquired contracture of Achilles tendon, left   . Essential hypertension 10/02/2016  . CKD (chronic kidney disease), stage III 10/02/2016  . Bilateral chronic knee pain 07/09/2015  . Chronic osteomyelitis of left foot (HCC) 02/16/2015  . Type 2 diabetes mellitus with diabetic neuropathy, with long-term current use of insulin (HCC) 11/01/2014  . Diabetic  peripheral neuropathy (HCC) 10/25/2014   Past Medical History:  Diagnosis Date  . Arthritis   . Diabetes mellitus without complication (HCC)   . Edema   . Hypercholesteremia   . Hypertension   . Neuropathy     Family History  Problem Relation Age of Onset  . Cancer Mother   . Aneurysm Father     brain    Past Surgical History:  Procedure Laterality Date  . AMPUTATION Right 02/17/2015   Procedure: SECOND RAY AMPUTATION;  Surgeon: Kathryne Hitchhristopher Y Blackman, MD;  [facility];  Service: Orthopedics;  Laterality: Right;  . AMPUTATION Left 10/04/2016   Procedure: GREAT TOE AMPUTATION GASTROCNEMIA RESECTION;  Surgeon: Nadara MustardMarcus Barre Aydelott V, MD;  Location: [facility] OR;  Service: Orthopedics;  Laterality: Left;  . INCISION AND DRAINAGE HIP Right 11/12/2016   Procedure: IRRIGATION AND DEBRIDEMENT RIGHT HIP;  Surgeon: Nadara MustardMarcus Aashi Derrington V, MD;  Location: [facility] OR;  Service: Orthopedics;  Laterality: Right;   Social History   Occupational History  . Not on file.   Social History Main Topics  . Smoking status: Current Every Day Smoker    Packs/day: 0.25    Types: Cigarettes  . Smokeless tobacco: Never Used     Comment: 8 cigs daily  . Alcohol use No     Comment: patient states he quit drinking since he has been sick  . Drug use: No     Comment: 2 weeks  . Sexual activity: Not on file

## 2016-12-09 ENCOUNTER — Ambulatory Visit (INDEPENDENT_AMBULATORY_CARE_PROVIDER_SITE_OTHER): Payer: Self-pay | Admitting: Internal Medicine

## 2016-12-09 ENCOUNTER — Encounter: Payer: Self-pay | Admitting: Internal Medicine

## 2016-12-09 DIAGNOSIS — M71051 Abscess of bursa, right hip: Secondary | ICD-10-CM

## 2016-12-09 DIAGNOSIS — M86672 Other chronic osteomyelitis, left ankle and foot: Secondary | ICD-10-CM

## 2016-12-09 NOTE — Assessment & Plan Note (Signed)
No new issues or drainage.  No further treatment indicated at this time

## 2016-12-09 NOTE — Assessment & Plan Note (Signed)
Healed.  No further treatment indicated.

## 2016-12-09 NOTE — Progress Notes (Signed)
   Subjective:    Patient ID: Ryan ClockWilber Shaw, male    DOB: 03/31/1966, 51 y.o.   MRN: [id number]  HPI Here for hsfu. 51 yo male with amputation of toe for osteomyelitis and hip infection not felt to involve the joint comes in for follow up. He completes doxycycline tomorrow.  No new issues with hip or foot.  No associated n/v/d.     Review of Systems  Constitutional: Negative for chills and fever.  Gastrointestinal: Negative for abdominal pain and nausea.  Skin: Negative for rash.       Objective:   Physical Exam  Constitutional: He appears well-developed and well-nourished.  Eyes: No scleral icterus.  Cardiovascular: Normal rate, regular rhythm and normal heart sounds.   No murmur heard. Pulmonary/Chest: Effort normal and breath sounds normal.  Musculoskeletal:  Foot with no opening, no drainage  Skin: No rash noted.          Assessment & Plan:

## 2016-12-10 ENCOUNTER — Other Ambulatory Visit: Payer: Self-pay | Admitting: *Deleted

## 2016-12-10 MED ORDER — INSULIN SYRINGES (DISPOSABLE) U-100 1 ML MISC: 1.0000 | Freq: Three times a day (TID) | 12 refills | Status: AC

## 2016-12-10 NOTE — Telephone Encounter (Signed)
SYRINGES ORDERED FOR PATIENT TO [facility]

## 2016-12-11 MED FILL — TRUEPLUS SYR 1ML 31GX5/16: 31G X 5/16" | 30 days supply | Qty: 100 | Fill #0

## 2016-12-11 MED FILL — TRUEPLUS SYR 1ML 31GX5/16": 31G X 5/16" | 30 days supply | Qty: 100 | Fill #0

## 2016-12-15 ENCOUNTER — Encounter (HOSPITAL_COMMUNITY): Payer: Self-pay | Admitting: Emergency Medicine

## 2016-12-15 ENCOUNTER — Emergency Department (HOSPITAL_COMMUNITY): Payer: Medicaid Other

## 2016-12-15 ENCOUNTER — Inpatient Hospital Stay (HOSPITAL_COMMUNITY)
Admission: EM | Admit: 2016-12-15 | Discharge: 2016-12-17 | DRG: 617 | Disposition: A | Discharge status: Home Health | Payer: Medicaid Other | Attending: Internal Medicine | Admitting: Internal Medicine

## 2016-12-15 DIAGNOSIS — F17210 Nicotine dependence, cigarettes, uncomplicated: Secondary | ICD-10-CM | POA: Diagnosis present

## 2016-12-15 DIAGNOSIS — E1165 Type 2 diabetes mellitus with hyperglycemia: Secondary | ICD-10-CM | POA: Diagnosis present

## 2016-12-15 DIAGNOSIS — M868X7 Other osteomyelitis, ankle and foot: Secondary | ICD-10-CM | POA: Diagnosis present

## 2016-12-15 DIAGNOSIS — N183 Chronic kidney disease, stage 3 unspecified: Secondary | ICD-10-CM | POA: Diagnosis present

## 2016-12-15 DIAGNOSIS — E7800 Pure hypercholesterolemia, unspecified: Secondary | ICD-10-CM | POA: Diagnosis present

## 2016-12-15 DIAGNOSIS — E785 Hyperlipidemia, unspecified: Secondary | ICD-10-CM | POA: Diagnosis present

## 2016-12-15 DIAGNOSIS — I129 Hypertensive chronic kidney disease with stage 1 through stage 4 chronic kidney disease, or unspecified chronic kidney disease: Secondary | ICD-10-CM | POA: Diagnosis present

## 2016-12-15 DIAGNOSIS — L03032 Cellulitis of left toe: Secondary | ICD-10-CM | POA: Diagnosis present

## 2016-12-15 DIAGNOSIS — L97529 Non-pressure chronic ulcer of other part of left foot with unspecified severity: Secondary | ICD-10-CM | POA: Diagnosis present

## 2016-12-15 DIAGNOSIS — E11621 Type 2 diabetes mellitus with foot ulcer: Secondary | ICD-10-CM | POA: Diagnosis present

## 2016-12-15 DIAGNOSIS — B9562 Methicillin resistant Staphylococcus aureus infection as the cause of diseases classified elsewhere: Secondary | ICD-10-CM | POA: Diagnosis present

## 2016-12-15 DIAGNOSIS — Z89412 Acquired absence of left great toe: Secondary | ICD-10-CM | POA: Diagnosis not present

## 2016-12-15 DIAGNOSIS — M79675 Pain in left toe(s): Secondary | ICD-10-CM | POA: Diagnosis present

## 2016-12-15 DIAGNOSIS — E1169 Type 2 diabetes mellitus with other specified complication: Secondary | ICD-10-CM | POA: Diagnosis present

## 2016-12-15 DIAGNOSIS — E1142 Type 2 diabetes mellitus with diabetic polyneuropathy: Secondary | ICD-10-CM | POA: Diagnosis present

## 2016-12-15 DIAGNOSIS — E1140 Type 2 diabetes mellitus with diabetic neuropathy, unspecified: Secondary | ICD-10-CM

## 2016-12-15 DIAGNOSIS — E1100 Type 2 diabetes mellitus with hyperosmolarity without nonketotic hyperglycemic-hyperosmolar coma (NKHHC): Secondary | ICD-10-CM

## 2016-12-15 DIAGNOSIS — Z89421 Acquired absence of other right toe(s): Secondary | ICD-10-CM

## 2016-12-15 DIAGNOSIS — Z794 Long term (current) use of insulin: Secondary | ICD-10-CM

## 2016-12-15 DIAGNOSIS — A4902 Methicillin resistant Staphylococcus aureus infection, unspecified site: Secondary | ICD-10-CM | POA: Diagnosis present

## 2016-12-15 DIAGNOSIS — M869 Osteomyelitis, unspecified: Secondary | ICD-10-CM

## 2016-12-15 DIAGNOSIS — I10 Essential (primary) hypertension: Secondary | ICD-10-CM | POA: Diagnosis present

## 2016-12-15 DIAGNOSIS — Z79899 Other long term (current) drug therapy: Secondary | ICD-10-CM

## 2016-12-15 HISTORY — DX: Chronic kidney disease, unspecified: N18.9

## 2016-12-15 LAB — CREATININE, SERUM: CREATININE: 1.13 mg/dL (ref 0.61–1.24)

## 2016-12-15 LAB — CBC
HCT: 37.1 % — ABNORMAL LOW (ref 39.0–52.0)
Hemoglobin: 12.1 g/dL — ABNORMAL LOW (ref 13.0–17.0)
MCH: 26.6 pg (ref 26.0–34.0)
MCHC: 32.6 g/dL (ref 30.0–36.0)
MCV: 81.5 fL (ref 78.0–100.0)
PLATELETS: 174 10*3/uL (ref 150–400)
RBC: 4.55 MIL/uL (ref 4.22–5.81)
RDW: 13.9 % (ref 11.5–15.5)
WBC: 5.6 10*3/uL (ref 4.0–10.5)

## 2016-12-15 LAB — CBC WITH DIFFERENTIAL/PLATELET
BASOS ABS: 0 10*3/uL (ref 0.0–0.1)
Basophils Relative: 0 %
Eosinophils Absolute: 0.3 10*3/uL (ref 0.0–0.7)
Eosinophils Relative: 4 %
HEMATOCRIT: 35.4 % — AB (ref 39.0–52.0)
Hemoglobin: 11.3 g/dL — ABNORMAL LOW (ref 13.0–17.0)
LYMPHS ABS: 1.8 10*3/uL (ref 0.7–4.0)
LYMPHS PCT: 27 %
MCH: 26.2 pg (ref 26.0–34.0)
MCHC: 31.9 g/dL (ref 30.0–36.0)
MCV: 81.9 fL (ref 78.0–100.0)
MONO ABS: 0.7 10*3/uL (ref 0.1–1.0)
Monocytes Relative: 10 %
NEUTROS ABS: 4 10*3/uL (ref 1.7–7.7)
Neutrophils Relative %: 59 %
Platelets: 175 10*3/uL (ref 150–400)
RBC: 4.32 MIL/uL (ref 4.22–5.81)
RDW: 13.4 % (ref 11.5–15.5)
WBC: 6.8 10*3/uL (ref 4.0–10.5)

## 2016-12-15 LAB — GLUCOSE, CAPILLARY
Glucose-Capillary: 190 mg/dL — ABNORMAL HIGH (ref 65–99)
Glucose-Capillary: 165 mg/dL — ABNORMAL HIGH (ref 65–99)

## 2016-12-15 LAB — BASIC METABOLIC PANEL
ANION GAP: 6 (ref 5–15)
BUN: 19 mg/dL (ref 6–20)
CHLORIDE: 109 mmol/L (ref 101–111)
CO2: 22 mmol/L (ref 22–32)
Calcium: 9.2 mg/dL (ref 8.9–10.3)
Creatinine, Ser: 1.26 mg/dL — ABNORMAL HIGH (ref 0.61–1.24)
GFR calc Af Amer: >60 mL/min (ref >60)
GLUCOSE: 123 mg/dL — AB (ref 65–99)
POTASSIUM: 4.6 mmol/L (ref 3.5–5.1)
Sodium: 137 mmol/L (ref 135–145)

## 2016-12-15 LAB — SURGICAL PCR SCREEN
MRSA, PCR: NEGATIVE
Staphylococcus aureus: NEGATIVE

## 2016-12-15 LAB — C-REACTIVE PROTEIN: CRP: 1.2 mg/dL — ABNORMAL HIGH (ref <1.0)

## 2016-12-15 MED ORDER — ATORVASTATIN CALCIUM 40 MG PO TABS
40.0000 mg | ORAL_TABLET | Freq: Every day | ORAL | Status: DC
  Administered 2016-12-15 – 2016-12-16 (×2): 40 mg via ORAL
  Filled 2016-12-15 (×2): qty 1

## 2016-12-15 MED ORDER — INSULIN ASPART 100 UNIT/ML ~~LOC~~ SOLN
0.0000 [IU] | Freq: Three times a day (TID) | SUBCUTANEOUS | Status: DC
  Administered 2016-12-16: 2 [IU] via SUBCUTANEOUS
  Administered 2016-12-16: 3 [IU] via SUBCUTANEOUS
  Administered 2016-12-17 (×2): 1 [IU] via SUBCUTANEOUS
  Administered 2016-12-17: 2 [IU] via SUBCUTANEOUS

## 2016-12-15 MED ORDER — PREGABALIN 100 MG PO CAPS
100.0000 mg | ORAL_CAPSULE | Freq: Two times a day (BID) | ORAL | Status: DC
  Administered 2016-12-15 – 2016-12-17 (×4): 100 mg via ORAL
  Filled 2016-12-15 (×4): qty 1

## 2016-12-15 MED ORDER — VANCOMYCIN HCL 10 G IV SOLR
2000.0000 mg | Freq: Once | INTRAVENOUS | Status: DC
  Filled 2016-12-15 (×2): qty 2000

## 2016-12-15 MED ORDER — INSULIN DETEMIR 100 UNIT/ML ~~LOC~~ SOLN
30.0000 [IU] | Freq: Two times a day (BID) | SUBCUTANEOUS | Status: DC
  Administered 2016-12-16 – 2016-12-17 (×4): 30 [IU] via SUBCUTANEOUS
  Filled 2016-12-15 (×5): qty 0.3

## 2016-12-15 MED ORDER — ONDANSETRON HCL 4 MG PO TABS: 4.0000 mg | ORAL_TABLET | Freq: Four times a day (QID) | ORAL | Status: DC | PRN

## 2016-12-15 MED ORDER — SODIUM CHLORIDE 0.9 % IV SOLN
INTRAVENOUS | Status: DC
  Administered 2016-12-15 – 2016-12-17 (×3): via INTRAVENOUS

## 2016-12-15 MED ORDER — POLYETHYLENE GLYCOL 3350 17 G PO PACK
17.0000 g | PACK | Freq: Two times a day (BID) | ORAL | Status: DC | PRN
  Administered 2016-12-17: 17 g via ORAL
  Filled 2016-12-15: qty 1

## 2016-12-15 MED ORDER — CEFEPIME HCL 2 G IJ SOLR
2.0000 g | Freq: Three times a day (TID) | INTRAMUSCULAR | Status: DC
  Administered 2016-12-15 – 2016-12-17 (×7): 2 g via INTRAVENOUS
  Filled 2016-12-15 (×9): qty 2

## 2016-12-15 MED ORDER — POVIDONE-IODINE 10 % EX SWAB
2.0000 "application " | Freq: Once | CUTANEOUS | Status: AC
  Administered 2016-12-16: 2 via TOPICAL

## 2016-12-15 MED ORDER — CHLORHEXIDINE GLUCONATE 4 % EX LIQD
60.0000 mL | Freq: Once | CUTANEOUS | Status: AC
  Administered 2016-12-15: 4 via TOPICAL
  Filled 2016-12-15: qty 60

## 2016-12-15 MED ORDER — POLYETHYLENE GLYCOL 3350 17 GM/SCOOP PO POWD
17.0000 g | Freq: Two times a day (BID) | ORAL | Status: DC | PRN
  Filled 2016-12-15: qty 255

## 2016-12-15 MED ORDER — ONDANSETRON HCL 4 MG/2ML IJ SOLN
4.0000 mg | Freq: Four times a day (QID) | INTRAMUSCULAR | Status: DC | PRN
  Administered 2016-12-16: 4 mg via INTRAVENOUS

## 2016-12-15 MED ORDER — MORPHINE SULFATE (PF) 4 MG/ML IV SOLN
4.0000 mg | Freq: Once | INTRAVENOUS | Status: AC
  Administered 2016-12-15: 4 mg via INTRAVENOUS
  Filled 2016-12-15: qty 1

## 2016-12-15 MED ORDER — LISINOPRIL 5 MG PO TABS
5.0000 mg | ORAL_TABLET | Freq: Every day | ORAL | Status: DC
  Administered 2016-12-15 – 2016-12-16 (×2): 5 mg via ORAL
  Filled 2016-12-15 (×2): qty 1

## 2016-12-15 MED ORDER — FENTANYL CITRATE (PF) 100 MCG/2ML IJ SOLN: 25.0000 ug | INTRAMUSCULAR | Status: DC | PRN

## 2016-12-15 MED ORDER — HEPARIN SODIUM (PORCINE) 5000 UNIT/ML IJ SOLN
5000.0000 [IU] | Freq: Three times a day (TID) | INTRAMUSCULAR | Status: DC
  Administered 2016-12-15 – 2016-12-17 (×4): 5000 [IU] via SUBCUTANEOUS
  Filled 2016-12-15 (×5): qty 1

## 2016-12-15 MED ORDER — VANCOMYCIN HCL IN DEXTROSE 750-5 MG/150ML-% IV SOLN
750.0000 mg | Freq: Two times a day (BID) | INTRAVENOUS | Status: DC
  Administered 2016-12-16 – 2016-12-17 (×4): 750 mg via INTRAVENOUS
  Filled 2016-12-15 (×4): qty 150

## 2016-12-15 MED ORDER — MORPHINE SULFATE (PF) 4 MG/ML IV SOLN
4.0000 mg | INTRAVENOUS | Status: DC | PRN
  Administered 2016-12-15 – 2016-12-16 (×7): 4 mg via INTRAVENOUS
  Filled 2016-12-15 (×7): qty 1

## 2016-12-15 MED ORDER — TRAMADOL HCL 50 MG PO TABS
100.0000 mg | ORAL_TABLET | Freq: Four times a day (QID) | ORAL | Status: DC | PRN
  Administered 2016-12-16 (×2): 100 mg via ORAL
  Filled 2016-12-15 (×2): qty 2

## 2016-12-15 NOTE — ED Notes (Signed)
Pharmacy aware of need for vanc,

## 2016-12-15 NOTE — ED Notes (Signed)
Attempted report 

## 2016-12-15 NOTE — ED Provider Notes (Signed)
Emergency Department Provider Note   I have reviewed the triage vital signs and the nursing notes.   HISTORY  Chief Complaint Toe Pain and Wound Check   HPI Ryan Shaw is a 51 y.o. male with PMH of DM, HLD, and HTN resents to the emergency department for evaluation of new, painful ulcer at the tip of the left second toe. The patient states he was recently discharged from ID care after his prior osteomyelitis seemed to have resolved. He completed his course of doxycycline 5 days ago. States that the new ulcer. Around that time. He notes severe pain in the tip of the toe with some swelling and redness. He is also complaining of radiation of pain up his leg to the knee. He denies recording any fevers at home but has had some chills. Denies noticing any other ulcerations. No injury to the toe. He has a history of toe amputation in the past.    Past Medical History:  Diagnosis Date  . Arthritis   . Diabetes mellitus without complication (HCC)   . Edema   . Hypercholesteremia   . Hypertension   . Neuropathy     Patient Active Problem List   Diagnosis Date Noted  . Osteomyelitis of toe of left foot (HCC) 12/15/2016  . Hyperlipidemia 12/15/2016  . MRSA infection   . Abscess of bursa of right hip   . Cellulitis of left lower extremity   . Fever   . Right hip pain   . Acute right ankle pain   . Idiopathic chronic gout of left ankle without tophus   . Amputee, great toe, left (HCC) 10/08/2016  . Diabetic foot ulcer (HCC) 10/08/2016  . Achilles tendon contracture, left   . 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 Acquired contracture of Achilles tendon, left   . Essential hypertension 10/02/2016  . CKD (chronic kidney disease), stage III 10/02/2016  . Bilateral chronic knee pain 07/09/2015  . Chronic osteomyelitis of left foot (HCC) 02/16/2015  . Type 2 diabetes mellitus with diabetic neuropathy, with Ronald Londo-term current use of insulin (HCC) 11/01/2014  .  Diabetic peripheral neuropathy (HCC) 10/25/2014    Past Surgical History:  Procedure Laterality Date  . AMPUTATION Right 02/17/2015   Procedure: SECOND RAY AMPUTATION;  Surgeon: Kathryne Hitch, MD;  Location: [facility] OR;  Service: Orthopedics;  Laterality: Right;  . AMPUTATION Left 10/04/2016   Procedure: GREAT TOE AMPUTATION GASTROCNEMIA RESECTION;  Surgeon: Nadara Mustard, MD;  Location: [facility] OR;  Service: Orthopedics;  Laterality: Left;  . INCISION AND DRAINAGE HIP Right 11/12/2016   Procedure: IRRIGATION AND DEBRIDEMENT RIGHT HIP;  Surgeon: Nadara Mustard, MD;  Location: [facility] OR;  Service: Orthopedics;  Laterality: Right;    Current Outpatient Rx  . Order #: [id number] Class: Historical Med  . Order #: [id number] Class: Normal  . Order #: [id number] Class: Normal  . Order #: [id number] Class: Normal  . Order #: [id number] Class: Normal  . Order #: [id number] Class: Normal  . Order #: [id number] Class: Print  . Order #: [id number] Class: Normal  . Order #: [id number] Class: Print  . Order #: [id number] Class: Print    Allergies Other and Vancomycin  Family History  Problem Relation Age of Onset  . Cancer Mother   . Aneurysm Father        brain    Social History Social History  Substance Use Topics  . Smoking status: Current Every Day Smoker    Packs/day: 0.10    Types: Cigarettes  .  Smokeless tobacco: Never Used     Comment: 3 cigs daily  . Alcohol use No     Comment: patient states he quit drinking since he has been sick    Review of Systems  Constitutional: No fever/chills Eyes: No visual changes. ENT: No sore throat. Cardiovascular: Denies chest pain. Respiratory: Denies shortness of breath. Gastrointestinal: No abdominal pain.  No nausea, no vomiting.  No diarrhea.  No constipation. Genitourinary: Negative for dysuria. Musculoskeletal: Negative for back pain. Positive left toe pain.  Skin: Positive left toe ulceration.  Neurological: Negative for headaches, focal weakness or  numbness.  10-point ROS otherwise negative.  ____________________________________________   PHYSICAL EXAM:  VITAL SIGNS: ED Triage Vitals [12/15/16 0753]  Enc Vitals Group     BP 110/84     Pulse Rate 95     Resp 18     Temp 98 F (36.7 C)     Temp Source Oral     SpO2 95 %     Weight 246 lb (111.6 kg)     Height 6\' 6"  (1.981 m)     Pain Score 10   Constitutional: Alert and oriented. Well appearing and in no acute distress. Eyes: Conjunctivae are normal. Head: Atraumatic. Nose: No congestion/rhinnorhea. Mouth/Throat: Mucous membranes are moist.  Oropharynx non-erythematous. Neck: No stridor.  Cardiovascular: Normal rate, regular rhythm. Good peripheral circulation. Grossly normal heart sounds.   Respiratory: Normal respiratory effort.  No retractions. Lungs CTAB. Gastrointestinal: Soft and nontender. No distention.  Musculoskeletal: Left 2nd toe redness and swelling with ulceration as below. Absent left great toe with no other areas of redness or drainage. Palpable DP pulses bilaterally.  Neurologic:  Normal speech and language. No gross focal neurologic deficits are appreciated.  Skin:  Skin is warm and dry. Ulcer to the tip of the left 2nd toe. Amputated left great toe. No additional ulcers or erythema.   ____________________________________________   LABS (all labs ordered are listed, but only abnormal results are displayed)  Labs Reviewed  BASIC METABOLIC PANEL - Abnormal; Notable for the following:       Result Value   Glucose, Bld 123 (*)    Creatinine, Ser 1.26 (*)    All other components within normal limits  CBC WITH DIFFERENTIAL/PLATELET - Abnormal; Notable for the following:    Hemoglobin 11.3 (*)    HCT 35.4 (*)    All other components within normal limits  C-REACTIVE PROTEIN - Abnormal; Notable for the following:    CRP 1.2 (*)    All other components within normal limits  CBC - Abnormal; Notable for the following:    Hemoglobin 12.1 (*)    HCT  37.1 (*)    All other components within normal limits  CULTURE, BLOOD (ROUTINE X 2)  CULTURE, BLOOD (ROUTINE X 2)  CREATININE, SERUM  CBC  BASIC METABOLIC PANEL   ____________________________________________  RADIOLOGY  Dg Foot Complete Left  Result Date: 12/15/2016 CLINICAL DATA:  Wound on the second toe x1 week. The wound is located at the distal tip of the toe, near the distal portion of the nailbed. EXAM: LEFT FOOT - COMPLETE 3+ VIEW COMPARISON:  MR foot 11/10/2016 FINDINGS: Prior amputation of the first proximal and distal phalanx. Soft tissue ulcer at the tip of the second toe. Cortical destruction of the distal tip of the second distal phalanx most concerning for osteomyelitis. No acute fracture dislocation. No other areas of bone destruction. No periosteal reaction. Small plantar calcaneal spur. Mild osteoarthritis of the  talonavicular joint. IMPRESSION: 1. Soft tissue ulcer at the tip of the second toe with underlying cortical destruction of the distal tip of the second distal phalanx most concerning for osteomyelitis. Electronically Signed   By: Elige KoHetal  Patel   On: 12/15/2016 10:21    ____________________________________________   PROCEDURES  Procedure(s) performed:   Procedures  None ____________________________________________   INITIAL IMPRESSION / ASSESSMENT AND PLAN / ED COURSE  Pertinent labs & imaging results that were available during my care of the patient were reviewed by me and considered in my medical decision making (see chart for details).  Patient resents to the emergency department for evaluation of left toe ulceration. He recently completed treatment for osteomyelitis in the left foot. Also has history of ostomy is in the right foot. He's been off antibiotics for the last 5 days. Does report some subjective fevers and chills at home with severe pain going up the left leg. He is followed by ID and Dr. Lajoyce [facility]ersuda with [facility]. Plan for imaging of the  foot and labs.   02:59 PM Discussed case with ortho who has seen the patient. Recommends abx per ID and hospitalist admission. No need for NPO at this time. Dr. Lajoyce Cornersuda will see the patient this evening.   Discussed patient's case with hospitalist. Patient and family (if present) updated with plan. Care transferred to hospitalist service.  I reviewed all nursing notes, vitals, pertinent old records, EKGs, labs, imaging (as available).  ____________________________________________  FINAL CLINICAL IMPRESSION(S) / ED DIAGNOSES  Final diagnoses:  Osteomyelitis of toe of left foot (HCC)     MEDICATIONS GIVEN DURING THIS VISIT:  Medications  morphine 4 MG/ML injection 4 mg (4 mg Intravenous Given 12/15/16 1633)  ceFEPIme (MAXIPIME) 2 g in dextrose 5 % 50 mL IVPB (2 g Intravenous New Bag/Given 12/15/16 1748)  vancomycin (VANCOCIN) 2,000 mg in sodium chloride 0.9 % 500 mL IVPB (not administered)  vancomycin (VANCOCIN) IVPB 750 mg/150 ml premix (not administered)  polyethylene glycol powder (GLYCOLAX/MIRALAX) container 17 g (not administered)  atorvastatin (LIPITOR) tablet 40 mg (not administered)  insulin detemir (LEVEMIR) injection 30 Units (not administered)  pregabalin (LYRICA) capsule 100 mg (not administered)  lisinopril (PRINIVIL,ZESTRIL) tablet 5 mg (not administered)  heparin injection 5,000 Units (not administered)  0.9 %  sodium chloride infusion (not administered)  traMADol (ULTRAM) tablet 100 mg (not administered)  ondansetron (ZOFRAN) tablet 4 mg (not administered)    Or  ondansetron (ZOFRAN) injection 4 mg (not administered)  insulin aspart (novoLOG) injection 0-9 Units (not administered)  morphine 4 MG/ML injection 4 mg (4 mg Intravenous Given 12/15/16 1241)     NEW OUTPATIENT MEDICATIONS STARTED DURING THIS VISIT:  None   Note:  This document was prepared using Dragon voice recognition software and may include unintentional dictation errors.  Alona BeneJoshua Mirranda Monrroy,  MD Emergency Medicine   Alexsys Eskin, Arlyss RepressJoshua G, MD 12/15/16 1901

## 2016-12-15 NOTE — Progress Notes (Signed)
Pharmacy Antibiotic Note  Ryan Shaw is a 51 y.o. male admitted on 12/15/2016 with toe ulcer with underlying osteomyelitis and ascending cellulitis. Recently d/c'd from ID care after prior toe OM and hip infection resolved per patient - completed doxy course 5 days PTA and now with new ulcer. Pharmacy has been consulted for vancomycin/cefepime dosing. Afeb, WBC wnl. SCr 1.26 on admit, normalized CrCl~71.  Noted hx of AKI with vancomycin - appears used Dapto recently here in April. Per Ortho note, likely to involve ID.  Plan: Cefepime 2g IV q8h Vancomycin 2g IV x1; then 750mg  IV q12h Monitor clinical progress, c/s, renal function F/u de-escalation plan/LOT, vancomycin trough as indicated F/u ID recommendations   Height: 6\' 6"  (198.1 cm) Weight: 246 lb (111.6 kg) IBW/kg (Calculated) : 91.4  Temp (24hrs), Avg:98 F (36.7 C), Min:98 F (36.7 C), Max:98 F (36.7 C)   Recent Labs Lab 12/15/16 0953  WBC 6.8  CREATININE 1.26*    Estimated Creatinine Clearance: 98.7 mL/min (A) (by C-G formula based on SCr of 1.26 mg/dL (H)).    Allergies  Allergen Reactions  . Other Nausea And Vomiting    NeuRemedy - Dietary Supplement  . Vancomycin Other (See Comments)    [facility] - shut kidneys down per pt    Antimicrobials this admission: 5/15 vancomycin >>  5/15 cefepime >>   Dose adjustments this admission:   Microbiology results:   Babs BertinHaley Venisha Boehning, PharmD, BCPS Clinical Pharmacist 12/15/2016 4:29 PM

## 2016-12-15 NOTE — Consult Note (Signed)
ORTHOPAEDIC CONSULTATION  REQUESTING PHYSICIAN: Ozella RocksMerrell, David J, MD  Chief Complaint: Recurrent pain and purulent drainage left second toe HPI: Ryan Shaw is a 51 y.o. male who presents status post left great toe amputation he has had recurrent infections in the second toe currently has purulent drainage has completed prolonged course of oral antibiotics.   Past Medical History:  Diagnosis Date  . Arthritis   . Diabetes mellitus without complication (HCC)   . Edema   . Hypercholesteremia   . Hypertension   . Neuropathy    Past Surgical History:  Procedure Laterality Date  . AMPUTATION Right 02/17/2015   Procedure: SECOND RAY AMPUTATION;  Surgeon: Kathryne Hitchhristopher Y Blackman, MD;  [facility];  Service: Orthopedics;  Laterality: Right;  . AMPUTATION Left 10/04/2016   Procedure: GREAT TOE AMPUTATION GASTROCNEMIA RESECTION;  Surgeon: Nadara MustardMarcus Duda V, MD;  Location: [facility] OR;  Service: Orthopedics;  Laterality: Left;  . INCISION AND DRAINAGE HIP Right 11/12/2016   Procedure: IRRIGATION AND DEBRIDEMENT RIGHT HIP;  Surgeon: Nadara MustardMarcus Duda V, MD;  Location: [facility] OR;  Service: Orthopedics;  Laterality: Right;   Social History   Social History  . Marital status: Legally Separated    Spouse name: N/A  . Number of children: N/A  . Years of education: N/A   Social History Main Topics  . Smoking status: Current Every Day Smoker    Packs/day: 0.10    Types: Cigarettes  . Smokeless tobacco: Never Used     Comment: 3 cigs daily  . Alcohol use No     Comment: patient states he quit drinking since he has been sick  . Drug use: No     Comment: 2 weeks  . Sexual activity: Not Asked   Other Topics Concern  . None   Social History Narrative  . None   Family History  Problem Relation Age of Onset  . Cancer Mother   . Aneurysm Father        brain   - negative except otherwise stated in the family history section Allergies  Allergen Reactions  . Other Nausea And Vomiting   NeuRemedy - Dietary Supplement  . Vancomycin Other (See Comments)    Given [facility] - shut kidneys down per pt   Prior to Admission medications   Medication Sig Start Date End Date Taking? Authorizing Provider  acetaminophen (TYLENOL) 500 MG tablet Take 1,000 mg by mouth every 6 (six) hours as needed for headache (pain).   Yes [provider]  atorvastatin (LIPITOR) 40 MG tablet Take 1 tablet (40 mg total) by mouth at bedtime. 11/25/16  Yes Amao, Odette HornsEnobong, MD  insulin aspart (NOVOLOG FLEXPEN) 100 UNIT/ML FlexPen 0-12 units 3 times daily before meals as per sliding scale. 11/25/16  Yes Jaclyn ShaggyAmao, Enobong, MD  insulin detemir (LEVEMIR) 100 UNIT/ML injection Inject 0.3 mLs (30 Units total) into the skin 2 (two) times daily. 11/25/16  Yes Jaclyn ShaggyAmao, Enobong, MD  Insulin Syringes, Disposable, U-100 1 ML MISC 1 each by Does not apply route 3 (three) times daily. 12/10/16  Yes Jaclyn ShaggyAmao, Enobong, MD  lisinopril (PRINIVIL,ZESTRIL) 5 MG tablet Take 1 tablet (5 mg total) by mouth daily. Patient taking differently: Take 5 mg by mouth at bedtime.  10/08/16  Yes Jaclyn ShaggyAmao, Enobong, MD  ondansetron (ZOFRAN ODT) 4 MG disintegrating tablet Take 1 tablet (4 mg total) by mouth every 8 (eight) hours as needed for nausea or vomiting. 11/28/16  Yes Madilyn Hookees,  Lanora Manis, MD  polyethylene glycol powder (GLYCOLAX/MIRALAX) powder Take 17 g by mouth 2 (two) times daily as needed. Patient taking differently: Take 17 g by mouth 2 (two) times daily as needed for mild constipation.  11/25/16  Yes Jaclyn Shaggy, MD  pregabalin (LYRICA) 100 MG capsule Take 1 capsule (100 mg total) by mouth 2 (two) times daily. 11/25/16  Yes Jaclyn Shaggy, MD  sildenafil (VIAGRA) 25 MG tablet Take 1 tablet (25 mg total) by mouth daily as needed for erectile dysfunction. Patient taking differently: Take 100 mg by mouth daily as needed for erectile dysfunction.  06/07/15  Yes Quentin Angst, MD   Dg Foot  Complete Left  Result Date: 12/15/2016 CLINICAL DATA:  Wound on the second toe x1 week. The wound is located at the distal tip of the toe, near the distal portion of the nailbed. EXAM: LEFT FOOT - COMPLETE 3+ VIEW COMPARISON:  MR foot 11/10/2016 FINDINGS: Prior amputation of the first proximal and distal phalanx. Soft tissue ulcer at the tip of the second toe. Cortical destruction of the distal tip of the second distal phalanx most concerning for osteomyelitis. No acute fracture dislocation. No other areas of bone destruction. No periosteal reaction. Small plantar calcaneal spur. Mild osteoarthritis of the talonavicular joint. IMPRESSION: 1. Soft tissue ulcer at the tip of the second toe with underlying cortical destruction of the distal tip of the second distal phalanx most concerning for osteomyelitis. Electronically Signed   By: Elige Ko   On: 12/15/2016 10:21   - pertinent xrays, CT, MRI studies were reviewed and independently interpreted  Positive ROS: All other systems have been reviewed and were otherwise negative with the exception of those mentioned in the HPI and as above.  Physical Exam: General: Alert, no acute distress Psychiatric: Patient is competent for consent with normal mood and affect Lymphatic: No axillary or cervical lymphadenopathy Cardiovascular: No pedal edema Respiratory: No cyanosis, no use of accessory musculature GI: No organomegaly, abdomen is soft and non-tender  Skin: Examination there is an ulcer over the tip of the second toe which probes to bone there is purulent drainage there is sausage digit swelling.   Neurologic: Patient does not have protective sensation bilateral lower extremities.   MUSCULOSKELETAL:  Patient has a good dorsalis pedis pulse. There is no ascending cellulitis. Radiograph shows destructive bony changes in the tip of the left second toe  Assessment: Assessment: Diabetic insensate neuropathy status post left great toe amputation with  osteomyelitis abscess and ulceration left second toe.  Plan: Plan: We will plan for a left second toe amputation. Patient may eat at this time nothing by mouth after midnight.  Thank you for the consult and the opportunity to see Mr. Ryan Hinderliter, MD [facility] [phone] 5:47 PM

## 2016-12-15 NOTE — ED Notes (Signed)
Attempted report x 3.  

## 2016-12-15 NOTE — ED Notes (Signed)
Left a Engineer, technical salesvoicemail for [facility].

## 2016-12-15 NOTE — Progress Notes (Signed)
[facility], RN to receive report. Will receive bedside report.

## 2016-12-15 NOTE — H&P (Signed)
History and Physical    Ryan Shaw WUJ:[id number] DOB: October 17, 1965 DOA: 12/15/2016  PCP: Jaclyn Shaggy, MD  Patient coming from: home  Chief Complaint: left second toe pain  HPI: Ryan Shaw is a 51 y.o. male with medical history significant of diabetes mellitus 2 with diabetic neuropathy, hypertension, CK D stage III, hypercholesterolemia presenting to the emergency department for evaluation of painful ulcer at the tip of the second left toe. 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  He was diagnosed with right hip abscess and on 11/12/2016 underwent I&D of the right hip.  He was on IV antibiotics and transitioned to oral doxycycline which he completed 5 days ago.  Approximately 2 weeks ago patient noticed swelling and discomfort of the second left toe. He was seen by Dr. Lajoyce Corners and instructed to come to the ED for evaluation if it becomes worse. Last night he noticed exquisitely tender red spot and swelling of the left second toe with the pain radiating along the shin towards the left knee. He also reported that at home he had some chills and felt hot at times, but didn't check the temperature.  ED Course: On arrival to the ED patient was febrile-his temperature was 60F, vital signs were stable. Blood work showed white blood cells count 6.8, mildly elevated creatinine 1.26 with normal BUN 19, CRP 1.2 Foot x-ray today showed soft tissue ulceration at the tip of the second toe with underlying cortical destruction of the distal tip of the second phalanx concerning for osteomyelitis  Review of Systems: As per HPI otherwise all other systems reviewed and  are negative  Ambulatory Status: Independent  Past Medical History:  Diagnosis Date  . Arthritis   . Diabetes mellitus without complication (HCC)   . Edema   . Hypercholesteremia   . Hypertension   . Neuropathy     Past Surgical  History:  Procedure Laterality Date  . AMPUTATION Right 02/17/2015   Procedure: SECOND RAY AMPUTATION;  Surgeon: Kathryne Hitch, MD;  Location: [facility] OR;  Service: Orthopedics;  Laterality: Right;  . AMPUTATION Left 10/04/2016   Procedure: GREAT TOE AMPUTATION GASTROCNEMIA RESECTION;  Surgeon: Nadara Mustard, MD;  Location: [facility] OR;  Service: Orthopedics;  Laterality: Left;  . INCISION AND DRAINAGE HIP Right 11/12/2016   Procedure: IRRIGATION AND DEBRIDEMENT RIGHT HIP;  Surgeon: Nadara Mustard, MD;  Location: [facility] OR;  Service: Orthopedics;  Laterality: Right;    Social History   Social History  . Marital status: Legally Separated    Spouse name: N/A  . Number of children: N/A  . Years of education: N/A   Occupational History  . Not on file.   Social History Main Topics  . Smoking status: Current Every Day Smoker    Packs/day: 0.10    Types: Cigarettes  . Smokeless tobacco: Never Used     Comment: 3 cigs daily  . Alcohol use No     Comment: patient states he quit drinking since he has been sick  . Drug use: No     Comment: 2 weeks  . Sexual activity: Not on file   Other Topics Concern  . Not on file   Social History Narrative  . No narrative on file    Allergies  Allergen Reactions  . Other Nausea And Vomiting    NeuRemedy - Dietary Supplement  . Vancomycin Other (See Comments)    [facility] - shut kidneys down per pt    Family History  Problem Relation Age of Onset  . Cancer Mother   . Aneurysm Father        brain    Prior to Admission medications   Medication Sig Start Date End Date Taking? Authorizing Provider  acetaminophen (TYLENOL) 500 MG tablet Take 1,000 mg by mouth every 6 (six) hours as needed for headache (pain).   Yes [provider]  atorvastatin (LIPITOR) 40 MG tablet Take 1 tablet (40 mg total) by mouth at bedtime. 11/25/16  Yes Amao, Odette HornsEnobong, MD  insulin aspart (NOVOLOG  FLEXPEN) 100 UNIT/ML FlexPen 0-12 units 3 times daily before meals as per sliding scale. 11/25/16  Yes Jaclyn ShaggyAmao, Enobong, MD  insulin detemir (LEVEMIR) 100 UNIT/ML injection Inject 0.3 mLs (30 Units total) into the skin 2 (two) times daily. 11/25/16  Yes Jaclyn ShaggyAmao, Enobong, MD  Insulin Syringes, Disposable, U-100 1 ML MISC 1 each by Does not apply route 3 (three) times daily. 12/10/16  Yes Jaclyn ShaggyAmao, Enobong, MD  lisinopril (PRINIVIL,ZESTRIL) 5 MG tablet Take 1 tablet (5 mg total) by mouth daily. Patient taking differently: Take 5 mg by mouth at bedtime.  10/08/16  Yes Jaclyn ShaggyAmao, Enobong, MD  ondansetron (ZOFRAN ODT) 4 MG disintegrating tablet Take 1 tablet (4 mg total) by mouth every 8 (eight) hours as needed for nausea or vomiting. 11/28/16  Yes Tilden Fossaees, Elizabeth, MD  polyethylene glycol powder (GLYCOLAX/MIRALAX) powder Take 17 g by mouth 2 (two) times daily as needed. Patient taking differently: Take 17 g by mouth 2 (two) times daily as needed for mild constipation.  11/25/16  Yes Jaclyn ShaggyAmao, Enobong, MD  pregabalin (LYRICA) 100 MG capsule Take 1 capsule (100 mg total) by mouth 2 (two) times daily. 11/25/16  Yes Jaclyn ShaggyAmao, Enobong, MD  sildenafil (VIAGRA) 25 MG tablet Take 1 tablet (25 mg total) by mouth daily as needed for erectile dysfunction. Patient taking differently: Take 100 mg by mouth daily as needed for erectile dysfunction.  06/07/15  Yes Quentin AngstJegede, Olugbemiga E, MD    Physical Exam: Vitals:   12/15/16 1336 12/15/16 1515 12/15/16 1615 12/15/16 1630  BP: 112/77 107/66 115/74 111/76  Pulse: 75 78 77 76  Resp: 16     Temp:      TempSrc:      SpO2: 100% 97% 100% 98%  Weight:      Height:         General: Appears calm and comfortable Eyes: PERRLA, EOMI, normal lids, iris ENT:  grossly normal hearing, lips & tongue, mucous membranes moist and intact Neck: no lymphoadenopathy, masses or thyromegaly Cardiovascular: RRR, no m/r/g. No JVD, carotid bruits. No LE edema.  Respiratory: bilateral no wheezes, rales, rhonchi or  cracles. Normal respiratory effort. No accessory muscle use observed Abdomen: soft, non-tender, non-distended, no organomegaly or masses appreciated. BS present in all quadrants Skin: no rash, ulcers or induration seen on limited exam Musculoskeletal: grossly normal tone BUE/BLE, good ROM, no bony abnormality or joint deformities observed.        Surgically absent left great toe, swollen and painful to touch left second toe with ulceration on the tip of the toe noted       The dorsal surface of the left foot has swelling and erythema extending to the pretibial region Psychiatric: grossly normal mood and affect, speech fluent and appropriate, alert and oriented x3 Neurologic: CN II-XII grossly intact, moves all extremities in coordinated fashion,  sensation intact  Labs on Admission: I have personally reviewed following labs and imaging studies  CBC, BMP  GFR: Estimated Creatinine Clearance: 98.7 mL/min (A) (by C-G formula based on SCr of 1.26 mg/dL (H)).   Creatinine Clearance: Estimated Creatinine Clearance: 98.7 mL/min (A) (by C-G formula based on SCr of 1.26 mg/dL (H)).    Radiological Exams on Admission: Dg Foot Complete Left  Result Date: 12/15/2016 CLINICAL DATA:  Wound on the second toe x1 week. The wound is located at the distal tip of the toe, near the distal portion of the nailbed. EXAM: LEFT FOOT - COMPLETE 3+ VIEW COMPARISON:  MR foot 11/10/2016 FINDINGS: Prior amputation of the first proximal and distal phalanx. Soft tissue ulcer at the tip of the second toe. Cortical destruction of the distal tip of the second distal phalanx most concerning for osteomyelitis. No acute fracture dislocation. No other areas of bone destruction. No periosteal reaction. Small plantar calcaneal spur. Mild osteoarthritis of the talonavicular joint. IMPRESSION: 1. Soft tissue ulcer at the tip of the second toe with underlying cortical destruction of the distal tip of the second distal phalanx most  concerning for osteomyelitis. Electronically Signed   By: Elige Ko   On: 12/15/2016 10:21    EKG: not found  Assessment/Plan Active Problems:   Type 2 diabetes mellitus with diabetic neuropathy, with long-term current use of insulin (HCC)   Essential hypertension   CKD (chronic kidney disease), stage III   MRSA infection   Osteomyelitis (HCC)   Hyperlipidemia   Second toe osteomyelitis - previous cultures were positive for MRSA The patient was empirically started on IV cefepime and vancomycin ID consult was requested by EDP and will follow their recommendations Provide pain control with opioids Dr. Lajoyce Corners was notified and will see patient later  DM type II with diabetic neuropathy - most recent Hgb A1c was 11.3%on 11/25/16 Continue Levemir, add sliding scale insulin Monitor CBGs and maintain on carb modified diet Continue Lyrica for neuropathy  Hyperlipidemia Continue Lipitor  Hypertension - continue lisinopril Monitor blood pressure adjust the doses if needed  DVT prophylaxis: Heparin Code Status: full Family Communication: none Disposition Plan: MedSurg Consults called: Ortho and ID by EDP Admission status: Inpatient   Raymon Mutton, [place] Pager: [phone] [facility]  If 7PM-7AM, please contact night-coverage [email or web address] Password [facility]  12/15/2016, 4:51 PM

## 2016-12-15 NOTE — ED Triage Notes (Signed)
Pt sts worsening infection to second toe on left foot; pt sts hx of amputation of great toe on same foot

## 2016-12-15 NOTE — Progress Notes (Signed)
Patient ID: Ryan Shaw, male   DOB: 01/01/1966, 51 y.o.   MRN: [id number]   LOS: 0 days   Subjective: Ryan Shaw comes to the ED 2/2 increasing pain in LLE stemming from an ulcer on the tip of his 2nd toe. The pain has spread to foot and lower leg. He's had some mild drainage from the ulcer but can't really characterize it. He's had some subjective fevers but nothing measured. His foot, ankle, and leg have swollen as well.   Objective: Vital signs in last 24 hours: Temp:  [98 F (36.7 C)] 98 F (36.7 C) (05/15 0753) Pulse Rate:  [75-95] 75 (05/15 1336) Resp:  [16-18] 16 (05/15 1336) BP: (110-123)/(72-98) 112/77 (05/15 1336) SpO2:  [95 %-100 %] 100 % (05/15 1336) Weight:  [111.6 kg (246 lb)] 111.6 kg (246 lb) (05/15 0753)     Laboratory  CBC  Recent Labs  12/15/16 0953  WBC 6.8  HGB 11.3*  HCT 35.4*  PLT 175   BMET  Recent Labs  12/15/16 0953  NA 137  K 4.6  CL 109  CO2 22  GLUCOSE 123*  BUN 19  CREATININE 1.26*  CALCIUM 9.2     Physical Exam General appearance: alert and no distress  Right No traumatic wounds, ecchymosis, or rash but dystrophic nails  Nontender  No effusions  Knee stable to varus/ valgus and anterior/posterior stress  Sens DPN, SPN, TN intact  Motor EHL, ext, flex, evers 5/5  DP 2+, No significant edema   LLE No traumatic wounds, ecchymosis, or rash, great toe surgically absent, small ulcer on tip of second toe, expressible purulence from site  TTP lower leg, ankle, and foot. No appreciable edema in calf but foot and ankle with 2+ NP edema, forefoot erythematous  No effusions  Sens DPN, SPN, TN intact  Motor EHL, ext, flex, evers 5/5  DP 2+    Assessment/Plan: Left 2nd toe ulcer with underlying osteomyelitis and ascending cellulitis -- Admit to IM, would recommend early ID involvement as they have been working with him recently. Ok to start antibiotics. Ok for diet. Dr. Lajoyce Cornersuda will see this evening.    Ryan CaldronMichael J. Domanik Rainville,  PA-C Orthopedic Surgery 12/15/2016

## 2016-12-16 ENCOUNTER — Encounter (HOSPITAL_COMMUNITY)
Admission: EM | Disposition: A | Discharge status: Home Health | Payer: Self-pay | Source: Home / Self Care | Attending: Internal Medicine

## 2016-12-16 ENCOUNTER — Encounter (HOSPITAL_COMMUNITY): Payer: Self-pay | Admitting: General Practice

## 2016-12-16 ENCOUNTER — Inpatient Hospital Stay (HOSPITAL_COMMUNITY): Payer: Medicaid Other | Admitting: Anesthesiology

## 2016-12-16 DIAGNOSIS — E784 Other hyperlipidemia: Secondary | ICD-10-CM

## 2016-12-16 DIAGNOSIS — I10 Essential (primary) hypertension: Secondary | ICD-10-CM

## 2016-12-16 DIAGNOSIS — M869 Osteomyelitis, unspecified: Secondary | ICD-10-CM

## 2016-12-16 DIAGNOSIS — E1140 Type 2 diabetes mellitus with diabetic neuropathy, unspecified: Secondary | ICD-10-CM

## 2016-12-16 DIAGNOSIS — Z794 Long term (current) use of insulin: Secondary | ICD-10-CM

## 2016-12-16 HISTORY — PX: AMPUTATION: SHX166

## 2016-12-16 LAB — GLUCOSE, CAPILLARY
Glucose-Capillary: 208 mg/dL — ABNORMAL HIGH (ref 65–99)
Glucose-Capillary: 128 mg/dL — ABNORMAL HIGH (ref 65–99)
Glucose-Capillary: 112 mg/dL — ABNORMAL HIGH (ref 65–99)
Glucose-Capillary: 197 mg/dL — ABNORMAL HIGH (ref 65–99)
Glucose-Capillary: 116 mg/dL — ABNORMAL HIGH (ref 65–99)

## 2016-12-16 LAB — CBC
HCT: 34.2 % — ABNORMAL LOW (ref 39.0–52.0)
Hemoglobin: 11.1 g/dL — ABNORMAL LOW (ref 13.0–17.0)
MCH: 26.6 pg (ref 26.0–34.0)
MCHC: 32.5 g/dL (ref 30.0–36.0)
MCV: 81.8 fL (ref 78.0–100.0)
PLATELETS: 156 10*3/uL (ref 150–400)
RBC: 4.18 MIL/uL — ABNORMAL LOW (ref 4.22–5.81)
RDW: 13.5 % (ref 11.5–15.5)
WBC: 5.6 10*3/uL (ref 4.0–10.5)

## 2016-12-16 LAB — BASIC METABOLIC PANEL
ANION GAP: 7 (ref 5–15)
BUN: 15 mg/dL (ref 6–20)
CALCIUM: 8.9 mg/dL (ref 8.9–10.3)
CO2: 24 mmol/L (ref 22–32)
CREATININE: 1.12 mg/dL (ref 0.61–1.24)
Chloride: 103 mmol/L (ref 101–111)
GLUCOSE: 196 mg/dL — AB (ref 65–99)
Potassium: 4.5 mmol/L (ref 3.5–5.1)
Sodium: 134 mmol/L — ABNORMAL LOW (ref 135–145)

## 2016-12-16 SURGERY — AMPUTATION, FOOT, RAY
Anesthesia: General | Laterality: Left

## 2016-12-16 MED ORDER — HYDROMORPHONE HCL 1 MG/ML IJ SOLN
1.0000 mg | INTRAMUSCULAR | Status: DC | PRN
  Administered 2016-12-17: 1 mg via INTRAVENOUS
  Filled 2016-12-16: qty 1

## 2016-12-16 MED ORDER — ONDANSETRON HCL 4 MG/2ML IJ SOLN
4.0000 mg | Freq: Four times a day (QID) | INTRAMUSCULAR | Status: DC | PRN
  Administered 2016-12-17: 4 mg via INTRAVENOUS
  Filled 2016-12-16: qty 2

## 2016-12-16 MED ORDER — ACETAMINOPHEN 650 MG RE SUPP: 650.0000 mg | Freq: Four times a day (QID) | RECTAL | Status: DC | PRN

## 2016-12-16 MED ORDER — LIDOCAINE 2% (20 MG/ML) 5 ML SYRINGE
INTRAMUSCULAR | Status: AC
  Filled 2016-12-16: qty 15

## 2016-12-16 MED ORDER — SODIUM CHLORIDE 0.9 % IV SOLN
INTRAVENOUS | Status: DC
  Administered 2016-12-16: 17:00:00 via INTRAVENOUS

## 2016-12-16 MED ORDER — LACTATED RINGERS IV SOLN
INTRAVENOUS | Status: DC
  Administered 2016-12-16 (×2): via INTRAVENOUS

## 2016-12-16 MED ORDER — METOCLOPRAMIDE HCL 5 MG PO TABS: 5.0000 mg | ORAL_TABLET | Freq: Three times a day (TID) | ORAL | Status: DC | PRN

## 2016-12-16 MED ORDER — OXYCODONE HCL 5 MG PO TABS
5.0000 mg | ORAL_TABLET | ORAL | Status: DC | PRN
  Administered 2016-12-16 – 2016-12-17 (×6): 10 mg via ORAL
  Filled 2016-12-16 (×7): qty 2

## 2016-12-16 MED ORDER — MIDAZOLAM HCL 5 MG/5ML IJ SOLN
INTRAMUSCULAR | Status: DC | PRN
  Administered 2016-12-16: 2 mg via INTRAVENOUS

## 2016-12-16 MED ORDER — FENTANYL CITRATE (PF) 250 MCG/5ML IJ SOLN
INTRAMUSCULAR | Status: AC
  Filled 2016-12-16: qty 5

## 2016-12-16 MED ORDER — 0.9 % SODIUM CHLORIDE (POUR BTL) OPTIME
TOPICAL | Status: DC | PRN
  Administered 2016-12-16: 1000 mL

## 2016-12-16 MED ORDER — FENTANYL CITRATE (PF) 100 MCG/2ML IJ SOLN: 25.0000 ug | INTRAMUSCULAR | Status: DC | PRN

## 2016-12-16 MED ORDER — POLYETHYLENE GLYCOL 3350 17 G PO PACK: 17.0000 g | PACK | Freq: Every day | ORAL | Status: DC | PRN

## 2016-12-16 MED ORDER — METOCLOPRAMIDE HCL 5 MG/ML IJ SOLN: 5.0000 mg | Freq: Three times a day (TID) | INTRAMUSCULAR | Status: DC | PRN

## 2016-12-16 MED ORDER — METHOCARBAMOL 500 MG PO TABS
500.0000 mg | ORAL_TABLET | Freq: Four times a day (QID) | ORAL | Status: DC | PRN
  Administered 2016-12-16 – 2016-12-17 (×4): 500 mg via ORAL
  Filled 2016-12-16 (×5): qty 1

## 2016-12-16 MED ORDER — ONDANSETRON HCL 4 MG/2ML IJ SOLN
INTRAMUSCULAR | Status: AC
  Filled 2016-12-16: qty 6

## 2016-12-16 MED ORDER — MAGNESIUM CITRATE PO SOLN: 1.0000 | Freq: Once | ORAL | Status: DC | PRN

## 2016-12-16 MED ORDER — LIDOCAINE HCL (CARDIAC) 20 MG/ML IV SOLN
INTRAVENOUS | Status: DC | PRN
  Administered 2016-12-16: 50 mg via INTRAVENOUS

## 2016-12-16 MED ORDER — MIDAZOLAM HCL 2 MG/2ML IJ SOLN
INTRAMUSCULAR | Status: AC
  Filled 2016-12-16: qty 2

## 2016-12-16 MED ORDER — DOCUSATE SODIUM 100 MG PO CAPS
100.0000 mg | ORAL_CAPSULE | Freq: Two times a day (BID) | ORAL | Status: DC
  Administered 2016-12-16 – 2016-12-17 (×2): 100 mg via ORAL
  Filled 2016-12-16 (×2): qty 1

## 2016-12-16 MED ORDER — ACETAMINOPHEN 325 MG PO TABS
650.0000 mg | ORAL_TABLET | Freq: Four times a day (QID) | ORAL | Status: DC | PRN
  Administered 2016-12-17: 650 mg via ORAL
  Filled 2016-12-16 (×2): qty 2

## 2016-12-16 MED ORDER — METHOCARBAMOL 1000 MG/10ML IJ SOLN
500.0000 mg | Freq: Four times a day (QID) | INTRAVENOUS | Status: DC | PRN
  Filled 2016-12-16: qty 5

## 2016-12-16 MED ORDER — BISACODYL 10 MG RE SUPP: 10.0000 mg | Freq: Every day | RECTAL | Status: DC | PRN

## 2016-12-16 MED ORDER — EPHEDRINE SULFATE-NACL 50-0.9 MG/10ML-% IV SOSY
PREFILLED_SYRINGE | INTRAVENOUS | Status: DC | PRN
  Administered 2016-12-16 (×2): 10 mg via INTRAVENOUS

## 2016-12-16 MED ORDER — PROPOFOL 10 MG/ML IV BOLUS
INTRAVENOUS | Status: AC
  Filled 2016-12-16: qty 20

## 2016-12-16 MED ORDER — ONDANSETRON HCL 4 MG PO TABS: 4.0000 mg | ORAL_TABLET | Freq: Four times a day (QID) | ORAL | Status: DC | PRN

## 2016-12-16 MED ORDER — PROPOFOL 10 MG/ML IV BOLUS
INTRAVENOUS | Status: DC | PRN
  Administered 2016-12-16: 150 mg via INTRAVENOUS

## 2016-12-16 MED ORDER — EPHEDRINE 5 MG/ML INJ
INTRAVENOUS | Status: AC
  Filled 2016-12-16: qty 10

## 2016-12-16 SURGICAL SUPPLY — 26 items
BLADE SAW SGTL MED 73X18.5 STR (BLADE) IMPLANT
BLADE SURG 21 STRL SS (BLADE) ×3 IMPLANT
BNDG COHESIVE 4X5 TAN STRL (GAUZE/BANDAGES/DRESSINGS) ×3 IMPLANT
BNDG GAUZE ELAST 4 BULKY (GAUZE/BANDAGES/DRESSINGS) ×3 IMPLANT
COVER SURGICAL LIGHT HANDLE (MISCELLANEOUS) ×3 IMPLANT
DRAPE U-SHAPE 47X51 STRL (DRAPES) ×6 IMPLANT
DRSG ADAPTIC 3X8 NADH LF (GAUZE/BANDAGES/DRESSINGS) ×3 IMPLANT
DRSG PAD ABDOMINAL 8X10 ST (GAUZE/BANDAGES/DRESSINGS) ×3 IMPLANT
DURAPREP 26ML APPLICATOR (WOUND CARE) ×3 IMPLANT
ELECT REM PT RETURN 9FT ADLT (ELECTROSURGICAL) ×3
ELECTRODE REM PT RTRN 9FT ADLT (ELECTROSURGICAL) ×1 IMPLANT
GAUZE SPONGE 4X4 12PLY STRL (GAUZE/BANDAGES/DRESSINGS) ×3 IMPLANT
GAUZE SPONGE 4X4 16PLY XRAY LF (GAUZE/BANDAGES/DRESSINGS) ×3 IMPLANT
GLOVE BIOGEL PI IND STRL 9 (GLOVE) ×2 IMPLANT
GLOVE BIOGEL PI INDICATOR 9 (GLOVE) ×4
GLOVE SURG ORTHO 9.0 STRL STRW (GLOVE) ×6 IMPLANT
GOWN STRL REUS W/ TWL XL LVL3 (GOWN DISPOSABLE) ×3 IMPLANT
GOWN STRL REUS W/TWL XL LVL3 (GOWN DISPOSABLE) ×6
KIT BASIN OR (CUSTOM PROCEDURE TRAY) ×3 IMPLANT
KIT ROOM TURNOVER OR (KITS) ×3 IMPLANT
NS IRRIG 1000ML POUR BTL (IV SOLUTION) ×3 IMPLANT
PACK ORTHO EXTREMITY (CUSTOM PROCEDURE TRAY) ×3 IMPLANT
PAD ARMBOARD 7.5X6 YLW CONV (MISCELLANEOUS) ×3 IMPLANT
STOCKINETTE IMPERVIOUS LG (DRAPES) IMPLANT
SUT ETHILON 2 0 PSLX (SUTURE) ×3 IMPLANT
TOWEL OR 17X26 10 PK STRL BLUE (TOWEL DISPOSABLE) ×3 IMPLANT

## 2016-12-16 NOTE — Transfer of Care (Signed)
Immediate Anesthesia Transfer of Care Note  Patient: Ryan Shaw  Procedure(s) Performed: Procedure(s): LEFT FOOT 2ND TOE AMPUTATION (Left)  Patient Location: PACU  Anesthesia Type:General  Level of Consciousness: awake, alert  and oriented  Airway & Oxygen Therapy: Patient Spontanous Breathing  Post-op Assessment: Report given to RN and Post -op Vital signs reviewed and stable  Post vital signs: Reviewed and stable  Last Vitals:  Vitals:   12/15/16 2350 12/16/16 0423  BP: 116/73 106/68  Pulse: 84 87  Resp: 20 18  Temp: 37 C 36.8 C    Last Pain:  Vitals:   12/16/16 1045  TempSrc:   PainSc: 8       Patients Stated Pain Goal: 5 (12/16/16 1045)  Complications: No apparent anesthesia complications

## 2016-12-16 NOTE — Progress Notes (Signed)
Received pt from ED. Pt arrived with Ryan Bockhelsea, RN on stretcher, pt was assisted to bed by nursing staff. Pt was oriented to nursing unit and call bell system. Pt is at risk for falls, educated pt on the Patient Safety Plan. Pt is now wearing yellow socks and bracelet, bed alarm is on. Pt is AAOx4 with no signs of distress. Pt's only complaint of pain is in left foot. Bedside report given in pt's room.

## 2016-12-16 NOTE — Interval H&P Note (Signed)
History and Physical Interval Note:  12/16/2016 6:55 AM  Ryan Shaw  has presented today for surgery, with the diagnosis of Infected Left 2nd Toe  The various methods of treatment have been discussed with the patient and family. After consideration of risks, benefits and other options for treatment, the patient has consented to  Procedure(s): LEFT FOOT 2ND TOE AMPUTATION (Left) as a surgical intervention .  The patient's history has been reviewed, patient examined, no change in status, stable for surgery.  I have reviewed the patient's chart and labs.  Questions were answered to the patient's satisfaction.     Nadara MustardMarcus V Duda

## 2016-12-16 NOTE — Anesthesia Procedure Notes (Signed)
Procedure Name: LMA Insertion Date/Time: 12/16/2016 2:35 PM Performed by: Izola PriceOCKFIELD JR, Shirl Ludington WALTON Pre-anesthesia Checklist: Patient identified, Emergency Drugs available, Suction available, Patient being monitored and Timeout performed Patient Re-evaluated:Patient Re-evaluated prior to inductionOxygen Delivery Method: Circle system utilized Preoxygenation: Pre-oxygenation with 100% oxygen Intubation Type: IV induction Ventilation: Mask ventilation without difficulty LMA: LMA inserted LMA Size: 5.0 Number of attempts: 1 Dental Injury: Teeth and Oropharynx as per pre-operative assessment

## 2016-12-16 NOTE — Anesthesia Postprocedure Evaluation (Signed)
Anesthesia Post Note  Patient: Ria ClockWilber Hise  Procedure(s) Performed: Procedure(s) (LRB): LEFT FOOT 2ND TOE AMPUTATION (Left)  Patient location during evaluation: PACU Anesthesia Type: General Level of consciousness: awake and alert Pain management: pain level controlled Vital Signs Assessment: post-procedure vital signs reviewed and stable Respiratory status: spontaneous breathing, nonlabored ventilation, respiratory function stable and patient connected to nasal cannula oxygen Cardiovascular status: blood pressure returned to baseline and stable Postop Assessment: no signs of nausea or vomiting Anesthetic complications: no       Last Vitals:  Vitals:   12/16/16 1515 12/16/16 1530  BP: 107/69 100/76  Pulse: 77 74  Resp: 20 18  Temp:  36.4 C    Last Pain:  Vitals:   12/16/16 1045  TempSrc:   PainSc: 8                  Kennieth RadFitzgerald, Jentzen Minasyan E

## 2016-12-16 NOTE — H&P (View-Only) (Signed)
ORTHOPAEDIC CONSULTATION  REQUESTING PHYSICIAN: Ozella RocksMerrell, David J, MD  Chief Complaint: Recurrent pain and purulent drainage left second toe HPI: Ryan Shaw is a 51 y.o. male who presents status post left great toe amputation he has had recurrent infections in the second toe currently has purulent drainage has completed prolonged course of oral antibiotics.   Past Medical History:  Diagnosis Date  . Arthritis   . Diabetes mellitus without complication (HCC)   . Edema   . Hypercholesteremia   . Hypertension   . Neuropathy    Past Surgical History:  Procedure Laterality Date  . AMPUTATION Right 02/17/2015   Procedure: SECOND RAY AMPUTATION;  Surgeon: Kathryne Hitchhristopher Y Blackman, MD;  [facility];  Service: Orthopedics;  Laterality: Right;  . AMPUTATION Left 10/04/2016   Procedure: GREAT TOE AMPUTATION GASTROCNEMIA RESECTION;  Surgeon: Nadara MustardMarcus Jenness Stemler V, MD;  Location: [facility] OR;  Service: Orthopedics;  Laterality: Left;  . INCISION AND DRAINAGE HIP Right 11/12/2016   Procedure: IRRIGATION AND DEBRIDEMENT RIGHT HIP;  Surgeon: Nadara MustardMarcus Alajah Witman V, MD;  Location: [facility] OR;  Service: Orthopedics;  Laterality: Right;   Social History   Social History  . Marital status: Legally Separated    Spouse name: N/A  . Number of children: N/A  . Years of education: N/A   Social History Main Topics  . Smoking status: Current Every Day Smoker    Packs/day: 0.10    Types: Cigarettes  . Smokeless tobacco: Never Used     Comment: 3 cigs daily  . Alcohol use No     Comment: patient states he quit drinking since he has been sick  . Drug use: No     Comment: 2 weeks  . Sexual activity: Not Asked   Other Topics Concern  . None   Social History Narrative  . None   Family History  Problem Relation Age of Onset  . Cancer Mother   . Aneurysm Father        brain   - negative except otherwise stated in the family history section Allergies  Allergen Reactions  . Other Nausea And Vomiting   NeuRemedy - Dietary Supplement  . Vancomycin Other (See Comments)    [facility] kidneys down per pt   Prior to Admission medications   Medication Sig Start Date End Date Taking? Authorizing Provider  acetaminophen (TYLENOL) 500 MG tablet Take 1,000 mg by mouth every 6 (six) hours as needed for headache (pain).   Yes [provider]  atorvastatin (LIPITOR) 40 MG tablet Take 1 tablet (40 mg total) by mouth at bedtime. 11/25/16  Yes Amao, Odette HornsEnobong, MD  insulin aspart (NOVOLOG FLEXPEN) 100 UNIT/ML FlexPen 0-12 units 3 times daily before meals as per sliding scale. 11/25/16  Yes Jaclyn ShaggyAmao, Enobong, MD  insulin detemir (LEVEMIR) 100 UNIT/ML injection Inject 0.3 mLs (30 Units total) into the skin 2 (two) times daily. 11/25/16  Yes Jaclyn ShaggyAmao, Enobong, MD  Insulin Syringes, Disposable, U-100 1 ML MISC 1 each by Does not apply route 3 (three) times daily. 12/10/16  Yes Jaclyn ShaggyAmao, Enobong, MD  lisinopril (PRINIVIL,ZESTRIL) 5 MG tablet Take 1 tablet (5 mg total) by mouth daily. Patient taking differently: Take 5 mg by mouth at bedtime.  10/08/16  Yes Jaclyn ShaggyAmao, Enobong, MD  ondansetron (ZOFRAN ODT) 4 MG disintegrating tablet Take 1 tablet (4 mg total) by mouth every 8 (eight) hours as needed for nausea or vomiting. 11/28/16  Yes Madilyn Hookees,  Lanora Manis, MD  polyethylene glycol powder (GLYCOLAX/MIRALAX) powder Take 17 g by mouth 2 (two) times daily as needed. Patient taking differently: Take 17 g by mouth 2 (two) times daily as needed for mild constipation.  11/25/16  Yes Jaclyn Shaggy, MD  pregabalin (LYRICA) 100 MG capsule Take 1 capsule (100 mg total) by mouth 2 (two) times daily. 11/25/16  Yes Jaclyn Shaggy, MD  sildenafil (VIAGRA) 25 MG tablet Take 1 tablet (25 mg total) by mouth daily as needed for erectile dysfunction. Patient taking differently: Take 100 mg by mouth daily as needed for erectile dysfunction.  06/07/15  Yes Quentin Angst, MD   Dg Foot  Complete Left  Result Date: 12/15/2016 CLINICAL DATA:  Wound on the second toe x1 week. The wound is located at the distal tip of the toe, near the distal portion of the nailbed. EXAM: LEFT FOOT - COMPLETE 3+ VIEW COMPARISON:  MR foot 11/10/2016 FINDINGS: Prior amputation of the first proximal and distal phalanx. Soft tissue ulcer at the tip of the second toe. Cortical destruction of the distal tip of the second distal phalanx most concerning for osteomyelitis. No acute fracture dislocation. No other areas of bone destruction. No periosteal reaction. Small plantar calcaneal spur. Mild osteoarthritis of the talonavicular joint. IMPRESSION: 1. Soft tissue ulcer at the tip of the second toe with underlying cortical destruction of the distal tip of the second distal phalanx most concerning for osteomyelitis. Electronically Signed   By: Elige Ko   On: 12/15/2016 10:21   - pertinent xrays, CT, MRI studies were reviewed and independently interpreted  Positive ROS: All other systems have been reviewed and were otherwise negative with the exception of those mentioned in the HPI and as above.  Physical Exam: General: Alert, no acute distress Psychiatric: Patient is competent for consent with normal mood and affect Lymphatic: No axillary or cervical lymphadenopathy Cardiovascular: No pedal edema Respiratory: No cyanosis, no use of accessory musculature GI: No organomegaly, abdomen is soft and non-tender  Skin: Examination there is an ulcer over the tip of the second toe which probes to bone there is purulent drainage there is sausage digit swelling.   Neurologic: Patient does not have protective sensation bilateral lower extremities.   MUSCULOSKELETAL:  Patient has a good dorsalis pedis pulse. There is no ascending cellulitis. Radiograph shows destructive bony changes in the tip of the left second toe  Assessment: Assessment: Diabetic insensate neuropathy status post left great toe amputation with  osteomyelitis abscess and ulceration left second toe.  Plan: Plan: We will plan for a left second toe amputation. Patient may eat at this time nothing by mouth after midnight.  Thank you for the consult and the opportunity to see Mr. Rhyland Hinderliter, MD [facility] [phone] 5:47 PM

## 2016-12-16 NOTE — Op Note (Signed)
12/15/2016 - 12/16/2016  2:54 PM  PATIENT:  Ria ClockWilber Seigler    PRE-OPERATIVE DIAGNOSIS:  Infected Left 2nd Toe  POST-OPERATIVE DIAGNOSIS:  Same  PROCEDURE:  LEFT FOOT 2ND TOE AMPUTATION  SURGEON:  Nadara MustardMarcus V Mikayla Chiusano, MD  PHYSICIAN ASSISTANT:None ANESTHESIA:   General  PREOPERATIVE INDICATIONS:  Ria ClockWilber Ancrum is a  51 y.o. male with a diagnosis of Infected Left 2nd Toe who failed conservative measures and elected for surgical management.    The risks benefits and alternatives were discussed with the patient preoperatively including but not limited to the risks of infection, bleeding, nerve injury, cardiopulmonary complications, the need for revision surgery, among others, and the patient was willing to proceed.  OPERATIVE IMPLANTS: None  OPERATIVE FINDINGS: Good petechial bleeding  OPERATIVE PROCEDURE: Patient is a 51 year old gentleman who is status post great toe amputation left who has developed osteomyelitis of the left second toe. He is undergone prolonged conservative therapy with antibiotics without resolution presents at this time for amputation of the second toe.  Patient brought the operating room and underwent a general anesthetic. After adequate levels anesthesia obtained patient's left lower extremity was prepped using DuraPrep draped into a sterile field a timeout was called. A fishmouth incision was made around the second toe the toe was amputated through the MTP joint. Wound was irrigated with normal saline electrocautery was used for hemostasis. The skin was closed using 2-0 nylon. A sterile compressive dressing was applied patient was extubated taken to the PACU in stable condition  Anticipate patient can be discharged to home once he is safe with transfers. Patient will not need IV antibiotics 24 hours after surgery.

## 2016-12-16 NOTE — Progress Notes (Signed)
Orthopedic Tech Progress Note Patient Details:  Ryan ClockWilber Shaw 04/19/1966 [id number]  Ortho Devices Type of Ortho Device: Postop shoe/boot Ortho Device/Splint Location: lle Ortho Device/Splint Interventions: Application   [name] 12/16/2016, 4:15 PM

## 2016-12-16 NOTE — Anesthesia Preprocedure Evaluation (Signed)
Anesthesia Evaluation  Patient identified by MRN, date of birth, ID band Patient awake    Reviewed: Allergy & Precautions, H&P , NPO status , Patient's Chart, lab work & pertinent test results, reviewed documented beta blocker date and time   History of Anesthesia Complications Negative for: history of anesthetic complications  Airway Mallampati: II  TM Distance: >3 FB Neck ROM: Full    Dental no notable dental hx. (+) Teeth Intact,    Pulmonary Current Smoker,    Pulmonary exam normal breath sounds clear to auscultation       Cardiovascular hypertension, Pt. on medications and Pt. on home beta blockers (-) angina(-) Past MI and (-) CHF  Rhythm:Regular Rate:Normal     Neuro/Psych  Neuromuscular disease    GI/Hepatic negative GI ROS, Neg liver ROS,   Endo/Other  diabetes, Type 2, Insulin Dependent  Renal/GU CRFRenal disease     Musculoskeletal  (+) Arthritis ,   Abdominal   Peds  Hematology  (+) anemia ,   Anesthesia Other Findings Diabetic peripheral neuropathy (HCC) Type 2 diabetes mellitus with diabetic neuropathy, with long-term current use of insulin (HCC) Chronic osteomyelitis of left foot (HCC) Bilateral chronic knee pain Essential hypertension CKD (chronic kidney disease), stage III    Reproductive/Obstetrics                             Anesthesia Physical  Anesthesia Plan  ASA: III  Anesthesia Plan: General   Post-op Pain Management:    Induction: Intravenous  Airway Management Planned: LMA  Additional Equipment: None  Intra-op Plan:   Post-operative Plan: Extubation in OR  Informed Consent: I have reviewed the patients History and Physical, chart, labs and discussed the procedure including the risks, benefits and alternatives for the proposed anesthesia with the patient or authorized representative who has indicated his/her understanding and acceptance.    Dental advisory given  Plan Discussed with: Surgeon and CRNA  Anesthesia Plan Comments:         Anesthesia Quick Evaluation

## 2016-12-16 NOTE — Progress Notes (Signed)
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 Patient has been seen in consultation by Dr. Lajoyce Cornersuda. Patient for left second toe amputation 12/16/2016. Patient empirically on IV vancomycin and IV cefepime.   Assessment & Plan:   Principal Problem:   Osteomyelitis of toe of left foot (HCC) Active Problems:   Type 2 diabetes mellitus with diabetic neuropathy, with long-term current use of insulin (HCC)   Essential hypertension   CKD (chronic kidney disease), stage III   MRSA infection   Hyperlipidemia   #1 left second toe osteomyelitis----prior cultures positive for MRSA Patient currently afebrile. Normal white count. Patient for left foot second toe amputation pending today. Continue empiric IV vancomycin and IV cefepime. ID consultation pending.  #2 poorly controlled type 2 diabetes with diabetic neuropathy Hemoglobin A1c 11.3 11/25/2016. CBGs ranging from 112-208. Continue Levemir and sliding scale insulin. Continue Lyrica.  #3 hyperlipidemia Continue Lipitor.  #4 hypertension Blood pressure stable. Continue lisinopril.   DVT prophylaxis: Heparin Code Status: Full Family Communication: Updated patient. No family present. Disposition Plan: Home postop when okay with orthopedics.   Consultants:   Orthopedics: Dr Lajoyce Cornersuda 12/15/2016  ID pending.  Procedures:   Plain films of the left foot 12/15/2016   left foot second toe amputation pending per Dr. Lajoyce Cornersuda 12/16/2016  Antimicrobials:   IV Maxipime 12/15/2016  IV vancomycin  12/15/2016   Subjective: Patient denies any chest. No shortness of breath. Patient states feeling better than on admission.  Objective: Vitals:   12/15/16 1945 12/15/16 2033 12/15/16 2350 12/16/16 0423  BP: 109/65 122/69 116/73 106/68  Pulse: 82 78 84 87  Resp:   20 18  Temp:  98.4 F (36.9 C) 98.6 F (37 C) 98.2 F (36.8 C)  TempSrc:  Oral Oral Oral  SpO2: 100% 100% 99% 98%  Weight:  109.8 kg (242 lb)    Height:  6\' 6"  (1.981 m)      Intake/Output Summary (Last 24 hours) at 12/16/16 1310 Last data filed at 12/16/16 0840  Gross per 24 hour  Intake             1360 ml  Output                0 ml  Net             1360 ml   Filed Weights   12/15/16 0753 12/15/16 2033  Weight: 111.6 kg (246 lb) 109.8 kg (242 lb)    Examination:  General exam: Appears calm and comfortable  Respiratory system: Clear to auscultation. Respiratory effort normal. Cardiovascular system: S1 & S2 heard, RRR. No JVD, murmurs, rubs, gallops or clicks. No pedal edema. Gastrointestinal system: Abdomen is nondistended, soft and nontender. No organomegaly or masses felt. Normal bowel sounds heard. Central nervous system: Alert and oriented. No focal neurological deficits. Extremities: Surgically absent left great toe. Ulceration on tip of second toe. Skin: No rashes, lesions or ulcers Psychiatry: Judgement and insight appear normal. Mood & affect appropriate.     Data Reviewed: I have personally reviewed following labs and imaging studies  CBC:  Recent Labs Lab 12/15/16 0953 12/15/16 1810 12/16/16 0610  WBC 6.8 5.6 5.6  NEUTROABS 4.0  --   --   HGB 11.3* 12.1* 11.1*  HCT 35.4* 37.1* 34.2*  MCV 81.9 81.5 81.8  PLT 175 174 156   Basic Metabolic Panel:  Recent Labs Lab 12/15/16 0953 12/15/16 1810 12/16/16 0610  NA 137  --  134*  K 4.6  --  4.5  CL 109  --  103  CO2 22  --  24  GLUCOSE 123*  --  196*  BUN 19  --  15  CREATININE 1.26* 1.13 1.12  CALCIUM 9.2  --  8.9    GFR: Estimated Creatinine Clearance: 110.3 mL/min (by C-G formula based on SCr of 1.12 mg/dL). Liver Function Tests: No results for input(s): AST, ALT, ALKPHOS, BILITOT, PROT, ALBUMIN in the last 168 hours. No results for input(s): LIPASE, AMYLASE in the last 168 hours. No results for input(s): AMMONIA in the last 168 hours. Coagulation Profile: No results for input(s): INR, PROTIME in the last 168 hours. Cardiac Enzymes: No results for input(s): CKTOTAL, CKMB, CKMBINDEX, TROPONINI in the last 168 hours. BNP (last 3 results) No results for input(s): PROBNP in the last 8760 hours. HbA1C: No results for input(s): HGBA1C in the last 72 hours. CBG:  Recent Labs Lab 12/15/16 2052 12/15/16 2305 12/16/16 0636 12/16/16 1152  GLUCAP 190* 165* 208* 128*   Lipid Profile: No results for input(s): CHOL, HDL, LDLCALC, TRIG, CHOLHDL, LDLDIRECT in the last 72 hours. Thyroid Function Tests: No results for input(s): TSH, T4TOTAL, FREET4, T3FREE, THYROIDAB in the last 72 hours. Anemia Panel: No results for input(s): VITAMINB12, FOLATE, FERRITIN, TIBC, IRON, RETICCTPCT in the last 72 hours. Sepsis Labs: No results for input(s): PROCALCITON, LATICACIDVEN in the last 168 hours.  Recent Results (from the past 240 hour(s))  Surgical pcr screen     Status: None   Collection Time: 12/15/16  8:40 PM  Result Value Ref Range Status   MRSA, PCR NEGATIVE NEGATIVE Final   Staphylococcus aureus NEGATIVE NEGATIVE Final    Comment:        The Xpert SA Assay (FDA approved for NASAL specimens in patients over 41 years of age), is one component of a comprehensive surveillance program.  Test performance has been validated by [facility] for patients greater than or equal to 2 year old. It is not intended to diagnose infection nor to guide or monitor treatment.          Radiology Studies: Dg Foot Complete Left  Result Date: 12/15/2016 CLINICAL DATA:  Wound on the second toe x1 week. The  wound is located at the distal tip of the toe, near the distal portion of the nailbed. EXAM: LEFT FOOT - COMPLETE 3+ VIEW COMPARISON:  MR foot 11/10/2016 FINDINGS: Prior amputation of the first proximal and distal phalanx. Soft tissue ulcer at the tip of the second toe. Cortical destruction of the distal tip of the second distal phalanx most concerning for osteomyelitis. No acute fracture dislocation. No other areas of bone destruction. No periosteal reaction. Small plantar calcaneal spur. Mild osteoarthritis of the talonavicular joint. IMPRESSION: 1. Soft tissue ulcer at the tip of the second toe with underlying cortical destruction of the distal tip of the second distal phalanx most concerning for osteomyelitis. Electronically Signed   By: Elige Ko   On: 12/15/2016 10:21        Scheduled Meds: . atorvastatin  40 mg Oral QHS  .  heparin  5,000 Units Subcutaneous Q8H  . insulin aspart  0-9 Units Subcutaneous TID WC  . insulin detemir  30 Units Subcutaneous BID  . lisinopril  5 mg Oral QHS  . pregabalin  100 mg Oral BID   Continuous Infusions: . sodium chloride 100 mL/hr at 12/16/16 0320  . ceFEPime (MAXIPIME) IV Stopped (12/16/16 0701)  . vancomycin    . vancomycin 750 mg (12/16/16 0514)     LOS: 1 day    Time spent: 40 mins    THOMPSON,DANIEL, MD [facility] Pager [phone]  If 7PM-7AM, please contact night-coverage [email or web address] Password [email or web address] 12/16/2016, 1:10 PM

## 2016-12-17 ENCOUNTER — Encounter (HOSPITAL_COMMUNITY): Payer: Self-pay | Admitting: Orthopedic Surgery

## 2016-12-17 DIAGNOSIS — N183 Chronic kidney disease, stage 3 (moderate): Secondary | ICD-10-CM

## 2016-12-17 LAB — BASIC METABOLIC PANEL
Anion gap: 8 (ref 5–15)
BUN: 16 mg/dL (ref 6–20)
CALCIUM: 8.8 mg/dL — AB (ref 8.9–10.3)
CHLORIDE: 103 mmol/L (ref 101–111)
CO2: 23 mmol/L (ref 22–32)
Creatinine, Ser: 1.04 mg/dL (ref 0.61–1.24)
GFR calc Af Amer: >60 mL/min (ref >60)
GLUCOSE: 141 mg/dL — AB (ref 65–99)
POTASSIUM: 4.4 mmol/L (ref 3.5–5.1)
Sodium: 134 mmol/L — ABNORMAL LOW (ref 135–145)

## 2016-12-17 LAB — CBC WITH DIFFERENTIAL/PLATELET
BASOS PCT: 0 %
Basophils Absolute: 0 10*3/uL (ref 0.0–0.1)
Eosinophils Absolute: 0.2 10*3/uL (ref 0.0–0.7)
Eosinophils Relative: 5 %
HEMATOCRIT: 33.5 % — AB (ref 39.0–52.0)
HEMOGLOBIN: 10.9 g/dL — AB (ref 13.0–17.0)
Lymphocytes Relative: 30 %
Lymphs Abs: 1.4 10*3/uL (ref 0.7–4.0)
MCH: 26.5 pg (ref 26.0–34.0)
MCHC: 32.5 g/dL (ref 30.0–36.0)
MCV: 81.5 fL (ref 78.0–100.0)
MONO ABS: 0.3 10*3/uL (ref 0.1–1.0)
MONOS PCT: 6 %
NEUTROS PCT: 59 %
Neutro Abs: 2.7 10*3/uL (ref 1.7–7.7)
PLATELETS: 152 10*3/uL (ref 150–400)
RBC: 4.11 MIL/uL — ABNORMAL LOW (ref 4.22–5.81)
RDW: 13.8 % (ref 11.5–15.5)
WBC: 4.6 10*3/uL (ref 4.0–10.5)

## 2016-12-17 LAB — GLUCOSE, CAPILLARY
GLUCOSE-CAPILLARY: 147 mg/dL — AB (ref 65–99)
GLUCOSE-CAPILLARY: 164 mg/dL — AB (ref 65–99)

## 2016-12-17 LAB — VANCOMYCIN, TROUGH: VANCOMYCIN TR: 10 ug/mL — AB (ref 15–20)

## 2016-12-17 MED ORDER — TRAMADOL HCL 50 MG PO TABS: 100.0000 mg | ORAL_TABLET | Freq: Four times a day (QID) | ORAL | 0 refills | Status: DC | PRN

## 2016-12-17 NOTE — Evaluation (Signed)
Physical Therapy Evaluation Patient Details Name: Ryan Shaw MRN: [id number] DOB: 05/12/1966 Today's Date: 12/17/2016   History of Present Illness  Pt is a 51 yo male with c/o R foot pain secondary to osteomyelitis of 2nd toe of L foot, s/p 2 ray amputation of L foot 12/16/16. PMH significant for DM II, HTN, CKD Stage III, MRSA infection, hyperlipidemia.  Clinical Impression  Patient is s/p above surgery resulting in functional limitations due to the deficits listed below (see PT Problem List). Pt mod I for bed mobility, min guard for transfers to RW and min guard for ambulation of 30 feet with RW. Pt had LoB requiring minAx1 to steady while performing standing exercises. Pt educated on breaking standing HEP down into single exercises to decrease fatigue and to make sure that he can sit directly down if he becomes fatigued.   Patient will benefit from skilled PT to increase their independence and safety with mobility to allow discharge to the venue listed below.       Follow Up Recommendations Home health PT;Supervision/Assistance - 24 hour    Equipment Recommendations  Rolling walker with 5" wheels       Precautions / Restrictions Precautions Precautions: Fall Restrictions Weight Bearing Restrictions: Yes LLE Weight Bearing: Touchdown weight bearing      Mobility  Bed Mobility Overal bed mobility: Modified Independent             General bed mobility comments: able to bring himself EoB with use of bed rails.  Transfers Overall transfer level: Needs assistance Equipment used: Rolling walker (2 wheeled) Transfers: Sit to/from Stand Sit to Stand: Min guard         General transfer comment: min guard for safety vc for hand placement and keeping RW closer   Ambulation/Gait Ambulation/Gait assistance: Min guard Ambulation Distance (Feet): 30 Feet Assistive device: Rolling walker (2 wheeled) Gait Pattern/deviations:  (hop to pattern) Gait velocity: slowed Gait  velocity interpretation: Below normal speed for age/gender General Gait Details: min guard for safety, vc for UE support to maintatain TTWB on L LE, pt limited by SoB and pain      Balance Overall balance assessment: Needs assistance Sitting-balance support: Feet unsupported;No upper extremity supported Sitting balance-Leahy Scale: Good Sitting balance - Comments: pt able to reach outside BoS with no LoB   Standing balance support: Bilateral upper extremity supported Standing balance-Leahy Scale: Poor Standing balance comment: pt requires RW for standing balance pt with LoB while performing exercise in standing requiring minAx1 to steady                             Pertinent Vitals/Pain Pain Assessment: 0-10 Pain Score: 6  Pain Location: R foot  Pain Descriptors / Indicators: Throbbing;Sharp;Constant  VSS    Home Living Family/patient expects to be discharged to:: Private residence Living Arrangements: Children Available Help at Discharge: Friend(s);Available 24 hours/day Type of Home: House Home Access: Stairs to enter Entrance Stairs-Rails: None Entrance Stairs-Number of Steps: 1 Home Layout: One level Home Equipment: Walker - 2 wheels;Cane - single point;Bedside commode      Prior Function Level of Independence: Independent with assistive device(s)         Comments: used single point cane, household ambulator        Extremity/Trunk Assessment   Upper Extremity Assessment Upper Extremity Assessment: Overall WFL for tasks assessed    Lower Extremity Assessment Lower Extremity Assessment: RLE deficits/detail;LLE deficits/detail RLE Deficits /  Details: ROM WFL and strength grossly 3+/5 RLE Sensation: history of peripheral neuropathy (no light touch sensation below mid calf) LLE Deficits / Details: hip and knee ROM WFL and strength grossly 3+/5  L ankle limited by surgical intervention LLE: Unable to fully assess due to pain LLE Sensation: history of  peripheral neuropathy;decreased light touch (no light touch below mid calf)    Cervical / Trunk Assessment Cervical / Trunk Assessment: Normal  Communication   Communication: No difficulties  Cognition Arousal/Alertness: Awake/alert Behavior During Therapy: WFL for tasks assessed/performed Overall Cognitive Status: Within Functional Limits for tasks assessed                                        General Comments General comments (skin integrity, edema, etc.): Pt with decreased endurance in mobilization and exercise. Pt educated to break ambulation down into manageable distances and to only perform one exercise in standing at a time and to rest in between. Pt skin on L LE in insensate area shiny and taught    Exercises Total Joint Exercises Knee Flexion: AROM;Left;10 reps;Standing (pt with LoB while performing able to correct with minA ) General Exercises - Lower Extremity Ankle Circles/Pumps: AROM;Both;10 reps;Seated Quad Sets: AROM;Left;10 reps;Seated Short Arc Quad: AROM;Left;10 reps;Seated Hip Flexion/Marching: AROM;Left;10 reps;Standing   Assessment/Plan    PT Assessment Patient needs continued PT services  PT Problem List Decreased strength;Decreased range of motion;Decreased activity tolerance;Decreased balance;Decreased mobility;Decreased knowledge of use of DME;Decreased safety awareness;Decreased knowledge of precautions;Impaired sensation;Pain;Decreased skin integrity       PT Treatment Interventions DME instruction;Gait training;Stair training;Functional mobility training;Therapeutic activities;Therapeutic exercise;Balance training;Patient/family education    PT Goals (Current goals can be found in the Care Plan section)  Acute Rehab PT Goals Patient Stated Goal: go home PT Goal Formulation: With patient Time For Goal Achievement: 12/31/16 Potential to Achieve Goals: Fair    Frequency Min 5X/week    AM-PAC PT "6 Clicks" Daily Activity  Outcome  Measure Difficulty turning over in bed (including adjusting bedclothes, sheets and blankets)?: A Little Difficulty moving from lying on back to sitting on the side of the bed? : A Little Difficulty sitting down on and standing up from a chair with arms (e.g., wheelchair, bedside commode, etc,.)?: Total Help needed moving to and from a bed to chair (including a wheelchair)?: A Little Help needed walking in hospital room?: A Little Help needed climbing 3-5 steps with a railing? : Total 6 Click Score: 14    End of Session Equipment Utilized During Treatment: Gait belt Activity Tolerance: Patient limited by fatigue;Patient limited by pain Patient left: in chair;with call bell/phone within reach Nurse Communication: Mobility status;Precautions;Weight bearing status;Other (comment) (asked CSW about possible resouces for diabetic shoes ) PT Visit Diagnosis: Unsteadiness on feet (R26.81);Other abnormalities of gait and mobility (R26.89);History of falling (Z91.81);Pain;Other symptoms and signs involving the nervous system (R29.898);Muscle weakness (generalized) (M62.81) Pain - Right/Left: Left Pain - part of body: Ankle and joints of foot    Time: [id number] PT Time Calculation (min) (ACUTE ONLY): 30 min   Charges:   PT Evaluation $PT Eval Low Complexity: 1 Procedure PT Treatments $Gait Training: 8-22 mins   PT G Codes:        Brockton Mckesson B. Beverely RisenVan Fleet PT, DPT Acute Rehabilitation  [phone] Pager [phone]    Elon Alaslizabeth B Van Fleet 12/17/2016, 3:51 PM

## 2016-12-17 NOTE — Discharge Summary (Signed)
Physician Discharge Summary  Ryan Shaw [id number] DOB: 11/25/1965 DOA: 12/15/2016  PCP: Ryan ShaggyAmao, Enobong, MD  Admit date: 12/15/2016 Discharge date: 12/17/2016  Time spent: 65 minutes  Recommendations for Outpatient Follow-up:  1. Follow up with Dr Lajoyce Cornersuda in 1 week.   Discharge Diagnoses:  Principal Problem:   Osteomyelitis of toe of left foot (HCC) Active Problems:   Type 2 diabetes mellitus with diabetic neuropathy, with long-term current use of insulin (HCC)   Essential hypertension   CKD (chronic kidney disease), stage III   MRSA infection   Hyperlipidemia   Discharge Condition: stable and improved.  Diet recommendation: Carb modified diet.  Filed Weights   12/15/16 0753 12/15/16 2033 12/16/16 1316  Weight: 111.6 kg (246 lb) 109.8 kg (242 lb) 109.8 kg (242 lb)    History of present illness:  Per Dr. Gillermina HuMerrell [name] is a 51 y.o. male with medical history significant of diabetes mellitus 2 with diabetic neuropathy, hypertension, CK D stage III, hypercholesterolemia presented to the emergency department for evaluation of painful ulcer at the tip of the second left toe. 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  He was diagnosed with right hip abscess and on 11/12/2016 underwent I&D of the right hip.  He was on IV antibiotics and transitioned to oral doxycycline which he completed 5 days ago.  Approximately 2 weeks ago patient noticed swelling and discomfort of the second left toe. He was seen by Dr. Lajoyce Cornersuda and instructed to come to the ED for evaluation if it becomes worse. The night prior to admission, he noticed exquisitely tender red spot and swelling of the left second toe with the pain radiating along the shin towards the left knee. He also reported that at home he had some chills and felt hot at times, but didn't check the temperature.  ED Course: On arrival to  the ED patient was afebrile-his temperature was 35F, vital signs were stable. Blood work showed white blood cells count 6.8, mildly elevated creatinine 1.26 with normal BUN 19, CRP 1.2 Foot x-ray showed soft tissue ulceration at the tip of the second toe with underlying cortical destruction of the distal tip of the second phalanx concerning for osteomyelitis   Hospital Course:  #1 left second toe osteomyelitis----prior cultures positive for MRSA Patient remained afebrile. Patient noted to have a normal white count. Patient was maintained on IV vancomycin and IV cefepime. Orthopedics was consulted and patient subsequently underwent left foot second toe amputation 12/16/2016 with no complications. Patient was maintained on IV antibiotics for 24 hours after surgery. It was felt per orthopedics that on discharge patient did not need any further antibiotics. Patient be discharged in stable and improved condition will follow-up with orthopedics in the outpatient setting.   #2 poorly controlled type 2 diabetes with diabetic neuropathy Hemoglobin A1c 11.3 11/25/2016. Patient placed on home regimen of levemir and Sliding scale insulin. Patient was maintained on lyrica.  #3 hyperlipidemia Continued on home regimen of Lipitor.  #4 hypertension Patient was maintained on home regimen of lisinopril. Blood pressure remained stable.   Procedures:  Plain films of the left foot 12/15/2016   left foot second toe amputation pending per Dr. Lajoyce Cornersuda 12/16/2016   Consultations:  Orthopedics: Dr Lajoyce Cornersuda 12/15/2016   Discharge Exam: Vitals:   12/17/16 0640 12/17/16 1312  BP: 105/67 113/66  Pulse: 80 74  Resp:  17  Temp: 97.8 F (36.6 C) 98  F (36.7 C)    General: NAD Cardiovascular: RRR Respiratory: CTAB  Discharge Instructions   Discharge Instructions    Diet Carb Modified    Complete by:  As directed    Increase activity slowly    Complete by:  As directed      Current Discharge  Medication List    START taking these medications   Details  traMADol (ULTRAM) 50 MG tablet Take 2 tablets (100 mg total) by mouth every 6 (six) hours as needed for moderate pain. Qty: 20 tablet, Refills: 0      CONTINUE these medications which have NOT CHANGED   Details  acetaminophen (TYLENOL) 500 MG tablet Take 1,000 mg by mouth every 6 (six) hours as needed for headache (pain).    atorvastatin (LIPITOR) 40 MG tablet Take 1 tablet (40 mg total) by mouth at bedtime. Qty: 30 tablet, Refills: 5    insulin aspart (NOVOLOG FLEXPEN) 100 UNIT/ML FlexPen 0-12 units 3 times daily before meals as per sliding scale. Qty: 15 mL, Refills: 5    insulin detemir (LEVEMIR) 100 UNIT/ML injection Inject 0.3 mLs (30 Units total) into the skin 2 (two) times daily. Qty: 30 mL, Refills: 5    Insulin Syringes, Disposable, U-100 1 ML MISC 1 each by Does not apply route 3 (three) times daily. Qty: 100 each, Refills: 12    lisinopril (PRINIVIL,ZESTRIL) 5 MG tablet Take 1 tablet (5 mg total) by mouth daily. Qty: 30 tablet, Refills: 3   Associated Diagnoses: Essential hypertension; Type 2 diabetes mellitus with diabetic neuropathy, with long-term current use of insulin (HCC); Diabetic peripheral neuropathy (HCC); Great toe amputation status, unspecified laterality (HCC); Diabetic ulcer of other part of right foot associated with type 2 diabetes mellitus, limited to breakdown of skin (HCC)    ondansetron (ZOFRAN ODT) 4 MG disintegrating tablet Take 1 tablet (4 mg total) by mouth every 8 (eight) hours as needed for nausea or vomiting. Qty: 12 tablet, Refills: 0    polyethylene glycol powder (GLYCOLAX/MIRALAX) powder Take 17 g by mouth 2 (two) times daily as needed. Qty: 3350 g, Refills: 1    pregabalin (LYRICA) 100 MG capsule Take 1 capsule (100 mg total) by mouth 2 (two) times daily. Qty: 60 capsule, Refills: 5   Associated Diagnoses: Diabetic polyneuropathy associated with type 2 diabetes mellitus (HCC)     sildenafil (VIAGRA) 25 MG tablet Take 1 tablet (25 mg total) by mouth daily as needed for erectile dysfunction. Qty: 30 tablet, Refills: 3   Associated Diagnoses: Erectile dysfunction due to diseases classified elsewhere       Allergies  Allergen Reactions  . Other Nausea And Vomiting    NeuRemedy - Dietary Supplement  . Vancomycin Other (See Comments)    Given after surgery in January 2018 at [facility] - shut kidneys down per pt   Follow-up Information    Nadara Mustard, MD Follow up in 1 week(s).   Specialty:  Orthopedic Surgery Contact information: [address] [place] [address] [phone]            The results of significant diagnostics from this hospitalization (including imaging, microbiology, ancillary and laboratory) are listed below for reference.    Significant Diagnostic Studies: Dg Chest 2 View  Result Date: 11/28/2016 CLINICAL DATA:  Generalized body aches. EXAM: CHEST  2 VIEW COMPARISON:  11/09/2016 FINDINGS: Cardiomediastinal silhouette is normal. Mediastinal contours appear intact. There is no evidence of focal airspace consolidation, pleural effusion or pneumothorax.  Osseous structures are without acute abnormality. Soft tissues are grossly normal. IMPRESSION: No active cardiopulmonary disease. Electronically Signed   By: Ted Mcalpine M.D.   On: 11/28/2016 16:25   Dg Foot Complete Left  Result Date: 12/15/2016 CLINICAL DATA:  Wound on the second toe x1 week. The wound is located at the distal tip of the toe, near the distal portion of the nailbed. EXAM: LEFT FOOT - COMPLETE 3+ VIEW COMPARISON:  MR foot 11/10/2016 FINDINGS: Prior amputation of the first proximal and distal phalanx. Soft tissue ulcer at the tip of the second toe. Cortical destruction of the distal tip of the second distal phalanx most concerning for osteomyelitis. No acute fracture dislocation. No other areas of bone destruction. No periosteal  reaction. Small plantar calcaneal spur. Mild osteoarthritis of the talonavicular joint. IMPRESSION: 1. Soft tissue ulcer at the tip of the second toe with underlying cortical destruction of the distal tip of the second distal phalanx most concerning for osteomyelitis. Electronically Signed   By: Elige Ko   On: 12/15/2016 10:21    Microbiology: Recent Results (from the past 240 hour(s))  Culture, blood (routine x 2)     Status: None (Preliminary result)   Collection Time: 12/15/16  6:05 PM  Result Value Ref Range Status   Specimen Description BLOOD LEFT ANTECUBITAL  Final   Special Requests   Final    BOTTLES DRAWN AEROBIC AND ANAEROBIC Blood Culture adequate volume   Culture NO GROWTH 2 DAYS  Final   Report Status PENDING  Incomplete  Culture, blood (routine x 2)     Status: None (Preliminary result)   Collection Time: 12/15/16  6:13 PM  Result Value Ref Range Status   Specimen Description BLOOD LEFT HAND  Final   Special Requests   Final    BOTTLES DRAWN AEROBIC ONLY Blood Culture adequate volume   Culture NO GROWTH 2 DAYS  Final   Report Status PENDING  Incomplete  Surgical pcr screen     Status: None   Collection Time: 12/15/16  8:40 PM  Result Value Ref Range Status   MRSA, PCR NEGATIVE NEGATIVE Final   Staphylococcus aureus NEGATIVE NEGATIVE Final    Comment:        The Xpert SA Assay (FDA approved for NASAL specimens in patients over 90 years of age), is one component of a comprehensive surveillance program.  Test performance has been validated by [facility] for patients greater than or equal to 46 year old. It is not intended to diagnose infection nor to guide or monitor treatment.      Labs: Basic Metabolic Panel:  Recent Labs Lab 12/15/16 0953 12/15/16 1810 12/16/16 0610 12/17/16 0553  NA 137  --  134* 134*  K 4.6  --  4.5 4.4  CL 109  --  103 103  CO2 22  --  24 23  GLUCOSE 123*  --  196* 141*  BUN 19  --  15 16  CREATININE 1.26* 1.13 1.12 1.04   CALCIUM 9.2  --  8.9 8.8*   Liver Function Tests: No results for input(s): AST, ALT, ALKPHOS, BILITOT, PROT, ALBUMIN in the last 168 hours. No results for input(s): LIPASE, AMYLASE in the last 168 hours. No results for input(s): AMMONIA in the last 168 hours. CBC:  Recent Labs Lab 12/15/16 0953 12/15/16 1810 12/16/16 0610 12/17/16 0553  WBC 6.8 5.6 5.6 4.6  NEUTROABS 4.0  --   --  2.7  HGB 11.3* 12.1* 11.1* 10.9*  HCT 35.4* 37.1* 34.2* 33.5*  MCV 81.9 81.5 81.8 81.5  PLT 175 174 156 152   Cardiac Enzymes: No results for input(s): CKTOTAL, CKMB, CKMBINDEX, TROPONINI in the last 168 hours. BNP: BNP (last 3 results) No results for input(s): BNP in the last 8760 hours.  ProBNP (last 3 results) No results for input(s): PROBNP in the last 8760 hours.  CBG:  Recent Labs Lab 12/16/16 1152 12/16/16 1500 12/16/16 1826 12/16/16 2141 12/17/16 1134  GLUCAP 128* 112* 197* 116* 147*       Signed:  THOMPSON,DANIEL MD.  [facility] 12/17/2016, 3:34 PM

## 2016-12-17 NOTE — Discharge Instructions (Signed)
Toe Amputation, Care After Refer to this sheet in the next few weeks. These instructions provide you with information on caring for yourself after your procedure. Your health care provider may also give you more specific instructions. Your treatment has been planned according to current medical practices, but problems sometimes occur. Call your health care provider if you have any problems or questions after your procedure. What can I expect after the procedure? After your procedure, it is common to have some pain. Pain usually improves within a week. Follow these instructions at home: Medicines   Take your antibiotic medicine as told by your health care provider. Do not stop taking the antibiotic even if you start to feel better.  Take over-the-counter and prescription medicines only as told by your health care provider. Managing pain, stiffness, and swelling    If directed, apply ice to the surgical area:  Put ice in a plastic bag.  Place a towel between your skin and the bag.  Leave the ice on for 20 minutes, 2-3 times a day.  Raise (elevate) your foot so it is above the level of your heart. This helps to reduce swelling.  Try to walk each day. Incision care    Follow instructions from your health care provider about how to take care of your cut from surgery (incision). Make sure you:  Wash your hands with soap and water before you change your bandage (dressing). If soap and water are not available, use hand sanitizer.  Change your dressing as told by your health care provider.  If you got stitches (sutures), leave them in place. They may need to stay in place for 2 weeks or longer.  Check your incision area every day for signs of infection. Check for:  More redness, swelling, or pain.  More fluid or blood.  Warmth.  Pus or a bad smell. Bathing   Do not take baths, swim, use a hot tub, or soak your foot until your health care provider approves.  You may shower  unless you were told not to. When you shower, keep your dressing dry.  If your dressing has been removed, you may wash your skin with warm water and soap. Driving   Do not drive for 24 hours if you received a sedative.  Do not drive or operate heavy machinery while taking prescription pain medicine. Activity   Do exercises as told by your health care provider.  Return to your normal activities as told by your health care provider. Ask your health care provider what activities are safe for you. General instructions   Do not use any tobacco products, such as cigarettes, chewing tobacco, and e-cigarettes. If you need help quitting, ask your health care provider.  Ask your health care provider about wearing special shoes or using inserts to support your foot.  If you have diabetes, keep your blood sugar under control.  Keep all follow-up visits as told by your health care provider. This is important. Contact a health care provider if:  You have more redness around your incision.  You have more fluid or blood coming from your incision.  Your incision feels warm to the touch.  You have pus or a bad smell coming from your incision.  You have a fever.  Your dressing is soaked with blood.  Your sutures tear or they separate.  You have numbness or tingling in your toes or foot.  Your foot is cool or pale, or it changes color.  Your pain does  not improve after you take your medicine. Get help right away if:  You have pain or swelling that gets worse or does not go away.  You have red streaks on your skin near your toes, foot, or leg.  You have pain in your calf or behind your knee.  You have shortness of breath.  You have chest pain. This information is not intended to replace advice given to you by your health care provider. Make sure you discuss any questions you have with your health care provider. Document Released: 07/01/2015 Document Revised: 03/23/2016 Document  Reviewed: 04/13/2015 Elsevier Interactive Patient Education  2017 [employer]   Bone and Joint Infections, Adult Bone infections (osteomyelitis) and joint infections (septic arthritis) occur when bacteria or other germs get inside a bone or a joint. This can happen if you have an infection in another part of your body that spreads through your blood. Germs from your skin or from outside of your body can also cause this type of infection if you have a wound or a broken bone (fracture) that breaks the skin. Anyone can get a bone infection or joint infection. You may be more likely to get this type of infection if you have a condition, such as diabetes, that lowers your ability to fight infection or increases your chances of getting an infection. Bone and joint infections can cause damage, and they can spread to other areas of your body. They need to be treated quickly. What are the causes? Most bone and joint infections are caused by bacteria. They can also be caused by other germs, such as viruses and funguses. What increases the risk? This condition is more likely to develop in:  People who recently had surgery, especially bone or joint surgery.  People who have a long-term (chronic) disease, such as:  HIV (human immunodeficiency virus).  Diabetes.  Rheumatoid arthritis.  Sickle cell anemia.  Elderly people.  People who take medicines that block or weaken the bodys defense system (immune system).  People who have a condition that reduces their blood flow.  People who are on kidney dialysis.  People who have an artificial joint.  People who have had a joint or bone repaired with plates or screws (surgical hardware).  People who use or abuse IV drugs.  People who have had trauma, such as stepping on a nail. What are the signs or symptoms? Symptoms vary depending on the type and location of your infection. Common symptoms of bone and joint infections include:  Fever and  chills.  Redness and warmth.  Swelling.  Pain and stiffness.  Drainage of fluid or pus near the infection.  Weight loss and fatigue.  Decreased ability to use a hand or foot. How is this diagnosed? This condition may be diagnosed based on symptoms, medical history, a physical exam, and diagnostic tests. Tests can help to identify the cause of the infection. You may have various tests, such as:  A sample of tissue, fluid, or blood taken to be examined under a microscope.  A procedure to remove fluid from the infected joint with a needle (joint aspiration) for testing in a lab.  Pus or discharge swabbed from a wound for testing to identify germs and to determine what type of medicine will kill them (culture and sensitivity).  Blood tests to look for evidence of infection and inflammation (biomarkers).  Imaging studies to determine how severe the bone or joint infection is. These may include:  X-rays.  CT scan.  MRI.  Bone scan. How is this treated? Treatment depends on the cause and type of infection. Antibiotic medicines are usually the first treatment for a bone or joint infection. Treatment with antibiotics may include:  Getting IV antibiotics. This may be done in a hospital at first. You may have to continue IV antibiotics at home for several weeks. You may also have to take antibiotics by mouth for several weeks after that.  Taking more than one kind of antibiotic. Treatment may start with a type of antibiotic that works against many different bacteria (broad spectrumantibiotics). IV antibiotics may be changed if tests show that another type may work better. Other treatments may include:  Draining fluid from the joint by placing a needle into it (aspiration).  Surgery to remove:  Dead or dying tissue from a bone or joint.  An infected artificial joint.  Infected plates or screws that were used to repair a broken bone. Follow these instructions at home:  Take  medicines only as directed by your health care provider.  Take your antibiotic medicine as directed by your health care provider. Finish the antibiotic even if you start to feel better.  Follow instructions from your health care provider about how to take IV antibiotics at home.  Ask your health care provider if you have any restrictions on your activities.  Keep all follow-up visits as directed by your health care provider. This is important. Contact a health care provider if:  You have a fever or chills.  You have redness, warmth, pain, or swelling that returns after treatment. Get help right away if:  You have rapid breathing or you have trouble breathing.  You have chest pain.  You cannot drink fluids or make urine.  The affected arm or leg swells, changes color, or turns blue. This information is not intended to replace advice given to you by your health care provider. Make sure you discuss any questions you have with your health care provider. Document Released: 07/20/2005 Document Revised: 12/26/2015 Document Reviewed: 07/18/2014 Elsevier Interactive Patient Education  2017 [employer].

## 2016-12-17 NOTE — Progress Notes (Signed)
Discharge instructions printed and reviewed with patient, and copy given for them to take home. All questions addressed at this time. New prescriptions reviewed and paper script given to patient to be filled after discharge. IV removed, and ensured surgical bandage was clean/dry/intact. Room searched for patient belongings, and confirmed with patient that all valuables were accounted for. Assisted patient to dress and pack his things, then staff escorted patient to discharge via wheelchair.

## 2016-12-20 LAB — CULTURE, BLOOD (ROUTINE X 2)
CULTURE: NO GROWTH
Culture: NO GROWTH
Special Requests: ADEQUATE
Special Requests: ADEQUATE

## 2016-12-25 ENCOUNTER — Ambulatory Visit (INDEPENDENT_AMBULATORY_CARE_PROVIDER_SITE_OTHER): Payer: Self-pay | Admitting: Family

## 2016-12-25 ENCOUNTER — Encounter (INDEPENDENT_AMBULATORY_CARE_PROVIDER_SITE_OTHER): Payer: Self-pay | Admitting: Family

## 2016-12-25 DIAGNOSIS — Z89422 Acquired absence of other left toe(s): Secondary | ICD-10-CM

## 2016-12-25 NOTE — Progress Notes (Signed)
Post-Op Visit Note   Patient: Ryan Shaw           Date of Birth: 03/03/1966           MRN: [id number] Visit Date: 12/25/2016 PCP: Jaclyn ShaggyAmao, Enobong, MD  Chief Complaint:  Chief Complaint  Patient presents with  . Left Foot - Routine Post Op    12/08/16 left great toe amputation     HPI:  The patient is a 51 year old gentleman who presents today one week status post left second toe amputation. The sutures remain in place. There is no gaping he has no complaints today is ambulating in a Darco shoe on the left.    Ortho Exam Incision is well approximated with sutures healing well. There is no gaping noted drainage minimal swelling no odor no erythema no sign of infection  Visit Diagnoses:  1. H/O amputation of lesser toe, left (HCC)     Plan: Continue in a Darco shoe or a postop shoe. Cleanse the incision daily. Apply dry dressing. Follow up in 1 week for suture removal.  Follow-Up Instructions: Return in about 1 week (around 01/01/2017).   Imaging: No results found.  Orders:  No orders of the defined types were placed in this encounter.  No orders of the defined types were placed in this encounter.    PMFS History: Patient Active Problem List   Diagnosis Date Noted  . H/O amputation of lesser toe, left (HCC) 12/25/2016  . Osteomyelitis of toe of left foot (HCC) 12/15/2016  . Hyperlipidemia 12/15/2016  . MRSA infection   . Abscess of bursa of right hip   . Cellulitis of left lower extremity   . Fever   . Right hip pain   . Acute right ankle pain   . Idiopathic chronic gout of left ankle without tophus   . Amputee, great toe, left (HCC) 10/08/2016  . Diabetic foot ulcer (HCC) 10/08/2016  . Achilles tendon contracture, left   . 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 Acquired contracture of Achilles tendon, left   . Essential hypertension 10/02/2016  . CKD (chronic kidney disease), stage III 10/02/2016  . Bilateral chronic knee pain 07/09/2015    . Chronic osteomyelitis of left foot (HCC) 02/16/2015  . Type 2 diabetes mellitus with diabetic neuropathy, with long-term current use of insulin (HCC) 11/01/2014  . Diabetic peripheral neuropathy (HCC) 10/25/2014   Past Medical History:  Diagnosis Date  . Arthritis   . CKD (chronic kidney disease)   . Diabetes mellitus without complication (HCC)   . Edema   . Hypercholesteremia   . Hypertension   . Neuropathy     Family History  Problem Relation Age of Onset  . Cancer Mother   . Aneurysm Father        brain    Past Surgical History:  Procedure Laterality Date  . AMPUTATION Right 02/17/2015   Procedure: SECOND RAY AMPUTATION;  Surgeon: Kathryne Hitchhristopher Y Blackman, MD;  [facility];  Service: Orthopedics;  Laterality: Right;  . AMPUTATION Left 10/04/2016   Procedure: GREAT TOE AMPUTATION GASTROCNEMIA RESECTION;  Surgeon: Nadara MustardMarcus Duda V, MD;  Location: [facility] OR;  Service: Orthopedics;  Laterality: Left;  . AMPUTATION Left 12/16/2016   Procedure: LEFT FOOT 2ND TOE AMPUTATION;  Surgeon: Nadara Mustarduda, Marcus V, MD;  [facility];  Service: Orthopedics;  Laterality: Left;  . HIP SURGERY Right   . INCISION AND DRAINAGE HIP Right 11/12/2016   Procedure: IRRIGATION AND DEBRIDEMENT RIGHT HIP;  Surgeon: Nadara Mustard, MD;  Location: [facility] OR;  Service: Orthopedics;  Laterality: Right;   Social History   Occupational History  . Not on file.   Social History Main Topics  . Smoking status: Current Every Day Smoker    Packs/day: 0.10    Types: Cigarettes  . Smokeless tobacco: Never Used     Comment: 3 cigs daily  . Alcohol use No     Comment: patient states he quit drinking since he has been sick  . Drug use: No     Comment: 2 weeks  . Sexual activity: Not on file

## 2016-12-30 ENCOUNTER — Other Ambulatory Visit: Payer: Self-pay | Admitting: Family Medicine

## 2016-12-30 MED ORDER — GABAPENTIN 300 MG PO CAPS: 300.0000 mg | ORAL_CAPSULE | Freq: Two times a day (BID) | ORAL | 1 refills | Status: DC

## 2016-12-30 MED FILL — GABAPENTIN 300 MG CAPSULE: 300 | 30 days supply | Qty: 60 | Fill #0

## 2017-01-01 ENCOUNTER — Ambulatory Visit (INDEPENDENT_AMBULATORY_CARE_PROVIDER_SITE_OTHER): Payer: Self-pay | Admitting: Family

## 2017-01-01 ENCOUNTER — Encounter (INDEPENDENT_AMBULATORY_CARE_PROVIDER_SITE_OTHER): Payer: Self-pay | Admitting: Family

## 2017-01-01 VITALS — Ht 78.0 in | Wt 242.0 lb

## 2017-01-01 DIAGNOSIS — Z89422 Acquired absence of other left toe(s): Secondary | ICD-10-CM

## 2017-01-01 DIAGNOSIS — M869 Osteomyelitis, unspecified: Secondary | ICD-10-CM

## 2017-01-01 DIAGNOSIS — E1169 Type 2 diabetes mellitus with other specified complication: Secondary | ICD-10-CM

## 2017-01-01 MED FILL — ?ATORVASTATIN 40MG TABLET: 40 | 30 days supply | Qty: 30 | Fill #1

## 2017-01-01 MED FILL — !LEVEMIR 100 UNITS/ML VIAL: 100/ML | 29 days supply | Qty: 20 | Fill #1

## 2017-01-01 MED FILL — POLYETHYLENE GLYCOL 3350: 7 days supply | Qty: 255 | Fill #1

## 2017-01-01 MED FILL — !NOVOLOG 100UNITS/ML VIAL: 100/ML | 27 days supply | Qty: 10 | Fill #1

## 2017-01-01 MED FILL — ?LISINOPRIL 5 MG TABLET: 5 | 30 days supply | Qty: 30 | Fill #2

## 2017-01-01 NOTE — Progress Notes (Signed)
Post-Op Visit Note   Patient: Ryan Shaw           Date of Birth: [birth year]           MRN: [id number] Visit Date: 01/01/2017 PCP: Jaclyn Shaggy, MD  Chief Complaint:  Chief Complaint  Patient presents with  . Left Foot - Routine Post Op    12/16/16 left foot 2nd toe amputation    HPI:  The patient is a 51 year old gentleman who is 2 weeks status post left foot second toe amputation. This has healed. Sutures remain in place today. He's been ambulating full weightbearing in regular shoe wear without concern. Does not space or from his great toe amputation states is doing fine without one.    Ortho Exam sutures harvested today. The incision is well healed there is no drainage no erythema no odor no sign of infection  Visit Diagnoses:  1. H/O amputation of lesser toe, left (HCC)   2. 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 Is already in regular shoewear. He'll follow-up in office in 2 weeks if he has any concerns.  Follow-Up Instructions: Return in about 2 weeks (around 01/15/2017).   Imaging: No results found.  Orders:  No orders of the defined types were placed in this encounter.  No orders of the defined types were placed in this encounter.    PMFS History: Patient Active Problem List   Diagnosis Date Noted  . H/O amputation of lesser toe, left (HCC) 12/25/2016  . Hyperlipidemia 12/15/2016  . MRSA infection   . Abscess of bursa of right hip   . Cellulitis of left lower extremity   . Fever   . Right hip pain   . Acute right ankle pain   . Idiopathic chronic gout of left ankle without tophus   . Amputee, great toe, left (HCC) 10/08/2016  . Diabetic foot ulcer (HCC) 10/08/2016  . 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 Acquired contracture of Achilles tendon, left   . Essential hypertension 10/02/2016  . CKD (chronic kidney disease), stage III 10/02/2016  . Bilateral chronic knee  pain 07/09/2015  . Type 2 diabetes mellitus with diabetic neuropathy, with long-term current use of insulin (HCC) 11/01/2014  . Diabetic peripheral neuropathy (HCC) 10/25/2014   Past Medical History:  Diagnosis Date  . Arthritis   . CKD (chronic kidney disease)   . Diabetes mellitus without complication (HCC)   . Edema   . Hypercholesteremia   . Hypertension   . Neuropathy     Family History  Problem Relation Age of Onset  . Cancer Mother   . Aneurysm Father        brain    Past Surgical History:  Procedure Laterality Date  . AMPUTATION Right 02/17/2015   Procedure: SECOND RAY AMPUTATION;  Surgeon: Kathryne Hitch, MD;  Location: [facility] OR;  Service: Orthopedics;  Laterality: Right;  . AMPUTATION Left 10/04/2016   Procedure: GREAT TOE AMPUTATION GASTROCNEMIA RESECTION;  Surgeon: Nadara Mustard, MD;  Location: [facility] OR;  Service: Orthopedics;  Laterality: Left;  . AMPUTATION Left 12/16/2016   Procedure: LEFT FOOT 2ND TOE AMPUTATION;  Surgeon: Nadara Mustard, MD;  Location: [facility] OR;  Service: Orthopedics;  Laterality: Left;  . HIP SURGERY Right   . INCISION AND DRAINAGE HIP Right 11/12/2016   Procedure: IRRIGATION AND DEBRIDEMENT RIGHT HIP;  Surgeon: Nadara Mustard, MD;  Location: [facility] OR;  Service: Orthopedics;  Laterality: Right;   Social History   Occupational History  . Not on file.   Social History Main Topics  . Smoking status: Current Every Day Smoker    Packs/day: 0.10    Types: Cigarettes  . Smokeless tobacco: Never Used     Comment: 3 cigs daily  . Alcohol use No     Comment: patient states he quit drinking since he has been sick  . Drug use: No     Comment: 2 weeks  . Sexual activity: Not on file

## 2017-01-04 ENCOUNTER — Ambulatory Visit: Payer: Medicaid Other | Attending: Family Medicine | Admitting: Family Medicine

## 2017-01-04 ENCOUNTER — Encounter: Payer: Self-pay | Admitting: Family Medicine

## 2017-01-04 VITALS — BP 151/82 | HR 86 | Temp 97.7°F | Resp 18 | Ht 74.0 in

## 2017-01-04 DIAGNOSIS — Z89412 Acquired absence of left great toe: Secondary | ICD-10-CM | POA: Insufficient documentation

## 2017-01-04 DIAGNOSIS — I10 Essential (primary) hypertension: Secondary | ICD-10-CM

## 2017-01-04 DIAGNOSIS — Z881 Allergy status to other antibiotic agents status: Secondary | ICD-10-CM | POA: Diagnosis not present

## 2017-01-04 DIAGNOSIS — Z794 Long term (current) use of insulin: Secondary | ICD-10-CM | POA: Insufficient documentation

## 2017-01-04 DIAGNOSIS — E1142 Type 2 diabetes mellitus with diabetic polyneuropathy: Secondary | ICD-10-CM

## 2017-01-04 DIAGNOSIS — E1140 Type 2 diabetes mellitus with diabetic neuropathy, unspecified: Secondary | ICD-10-CM | POA: Diagnosis not present

## 2017-01-04 DIAGNOSIS — Z89421 Acquired absence of other right toe(s): Secondary | ICD-10-CM | POA: Insufficient documentation

## 2017-01-04 DIAGNOSIS — E7800 Pure hypercholesterolemia, unspecified: Secondary | ICD-10-CM | POA: Diagnosis not present

## 2017-01-04 DIAGNOSIS — E1122 Type 2 diabetes mellitus with diabetic chronic kidney disease: Secondary | ICD-10-CM | POA: Insufficient documentation

## 2017-01-04 DIAGNOSIS — F329 Major depressive disorder, single episode, unspecified: Secondary | ICD-10-CM | POA: Diagnosis not present

## 2017-01-04 DIAGNOSIS — Z89419 Acquired absence of unspecified great toe: Secondary | ICD-10-CM

## 2017-01-04 DIAGNOSIS — N189 Chronic kidney disease, unspecified: Secondary | ICD-10-CM | POA: Insufficient documentation

## 2017-01-04 DIAGNOSIS — E1169 Type 2 diabetes mellitus with other specified complication: Secondary | ICD-10-CM | POA: Diagnosis not present

## 2017-01-04 DIAGNOSIS — E162 Hypoglycemia, unspecified: Secondary | ICD-10-CM

## 2017-01-04 DIAGNOSIS — I129 Hypertensive chronic kidney disease with stage 1 through stage 4 chronic kidney disease, or unspecified chronic kidney disease: Secondary | ICD-10-CM | POA: Diagnosis present

## 2017-01-04 DIAGNOSIS — F419 Anxiety disorder, unspecified: Secondary | ICD-10-CM

## 2017-01-04 DIAGNOSIS — IMO0002 Reserved for concepts with insufficient information to code with codable children: Secondary | ICD-10-CM

## 2017-01-04 LAB — GLUCOSE, POCT (MANUAL RESULT ENTRY)
POC GLUCOSE: 89 mg/dL (ref 70–99)
POC Glucose: 35 mg/dl — AB (ref 70–99)

## 2017-01-04 MED ORDER — DULOXETINE HCL 60 MG PO CPEP: 60.0000 mg | ORAL_CAPSULE | Freq: Every day | ORAL | 3 refills | Status: DC

## 2017-01-04 MED ORDER — LISINOPRIL 5 MG PO TABS
5.0000 mg | ORAL_TABLET | Freq: Every day | ORAL | 3 refills | Status: DC
Start: 2017-01-04 — End: 2017-02-11

## 2017-01-04 MED FILL — TRUEPLUS SYR 1ML 31GX5/16": 31G X 5/16" | 30 days supply | Qty: 100 | Fill #1

## 2017-01-04 MED FILL — TRUEPLUS SYR 1ML 31GX5/16: 31G X 5/16" | 30 days supply | Qty: 100 | Fill #1

## 2017-01-04 MED FILL — ?DULOXETINE HCL DR 60 MG CA: 60 MG | 30 days supply | Qty: 30 | Fill #0

## 2017-01-04 NOTE — Addendum Note (Signed)
Addendum  created 01/04/17 1206 by Bobette Leyh, MD   Sign clinical note    

## 2017-01-04 NOTE — Progress Notes (Signed)
Subjective:    Patient ID: Ryan Shaw, male    DOB: 02/06/1966, 51 y.o.   MRN: [id number]  HPI He is a 51 year old male with a history of hypertension, type 2 diabetes mellitus (A1c 11.3 which is down from 13.5) diabetic neuropathy, hyperlipidemia, right second toe amputation, left great toe amputation in 10/2016 due to left great toe osteomyelitis, h/o right hip bursal abscess s/p irrigation and debridement here for follow-up of his diabetes mellitus.  He was found to complain of flushing and worsening anxiety while in the waiting room and was hurriedly brought back to the clinic area. He appeared sweaty and confused and was found to be hypoglycemic with a blood sugar of 35. He endorses taking his 30 units of Levemir however he did not have breakfast but just took a banana. 4 ounces of ginger ale along with peanut butter crackers was given and he improved subsequently  He informs me his right hip has healed but he does have a follow-up appointment with his orthopedic. He is concerned that he cannot afford the copay for his medications today. He also informed me he was unable to obtain his diabetic shoes due to lack of finances to be out of pocket for them.  Past Medical History:  Diagnosis Date  . Arthritis   . CKD (chronic kidney disease)   . Diabetes mellitus without complication (HCC)   . Edema   . Hypercholesteremia   . Hypertension   . Neuropathy     Past Surgical History:  Procedure Laterality Date  . AMPUTATION Right 02/17/2015   Procedure: SECOND RAY AMPUTATION;  Surgeon: Kathryne Hitch, MD;  Location: [facility] OR;  Service: Orthopedics;  Laterality: Right;  . AMPUTATION Left 10/04/2016   Procedure: GREAT TOE AMPUTATION GASTROCNEMIA RESECTION;  Surgeon: Nadara Mustard, MD;  Location: [facility] OR;  Service: Orthopedics;  Laterality: Left;  . AMPUTATION Left 12/16/2016   Procedure: LEFT FOOT 2ND TOE AMPUTATION;  Surgeon: Nadara Mustard, MD;  Location: [facility] OR;  Service: Orthopedics;   Laterality: Left;  . HIP SURGERY Right   . INCISION AND DRAINAGE HIP Right 11/12/2016   Procedure: IRRIGATION AND DEBRIDEMENT RIGHT HIP;  Surgeon: Nadara Mustard, MD;  Location: [facility] OR;  Service: Orthopedics;  Laterality: Right;    Allergies  Allergen Reactions  . Other Nausea And Vomiting    NeuRemedy - Dietary Supplement  . Vancomycin Other (See Comments)    Given after surgery in January 2018 at [facility] - shut kidneys down per pt    Current Outpatient Prescriptions on File Prior to Visit  Medication Sig Dispense Refill  . acetaminophen (TYLENOL) 500 MG tablet Take 1,000 mg by mouth every 6 (six) hours as needed for headache (pain).    Marland Kitchen atorvastatin (LIPITOR) 40 MG tablet Take 1 tablet (40 mg total) by mouth at bedtime. 30 tablet 5  . gabapentin (NEURONTIN) 300 MG capsule Take 1 capsule (300 mg total) by mouth 2 (two) times daily. 60 capsule 1  . insulin aspart (NOVOLOG FLEXPEN) 100 UNIT/ML FlexPen 0-12 units 3 times daily before meals as per sliding scale. 15 mL 5  . insulin detemir (LEVEMIR) 100 UNIT/ML injection Inject 0.3 mLs (30 Units total) into the skin 2 (two) times daily. 30 mL 5  . Insulin Syringes, Disposable, U-100 1 ML MISC 1 each by Does not apply route 3 (three) times daily. 100 each 12  . polyethylene glycol powder (GLYCOLAX/MIRALAX) powder Take 17 g by mouth 2 (two)  times daily as needed. (Patient taking differently: Take 17 g by mouth 2 (two) times daily as needed for mild constipation. ) 3350 g 1  . pregabalin (LYRICA) 100 MG capsule Take 1 capsule (100 mg total) by mouth 2 (two) times daily. 60 capsule 5  . sildenafil (VIAGRA) 25 MG tablet Take 1 tablet (25 mg total) by mouth daily as needed for erectile dysfunction. (Patient taking differently: Take 100 mg by mouth daily as needed for erectile dysfunction. ) 30 tablet 3  . traMADol (ULTRAM) 50 MG tablet Take 2 tablets (100 mg total) by mouth every 6 (six) hours as needed for moderate pain. 20  tablet 0   No current facility-administered medications on file prior to visit.       Review of Systems  Constitutional: Negative for activity change and appetite change.  HENT: Negative for sinus pressure and sore throat.   Eyes: Negative for visual disturbance.  Respiratory: Negative for cough, chest tightness and shortness of breath.   Cardiovascular: Negative for chest pain and leg swelling.  Gastrointestinal: Negative for abdominal distention, abdominal pain, constipation and diarrhea.  Endocrine: Negative.   Genitourinary: Negative for dysuria.  Musculoskeletal: Negative for joint swelling and myalgias.  Skin: Negative for rash.  Allergic/Immunologic: Negative.   Neurological: Positive for weakness, light-headedness and numbness.  Psychiatric/Behavioral: Positive for confusion. Negative for dysphoric mood and suicidal ideas. The patient is nervous/anxious.        Objective: Vitals:   01/04/17 1049  BP: (!) 151/82  Pulse: 86  Resp: 18  Temp: 97.7 F (36.5 C)  TempSrc: Oral  SpO2: 99%  Height: 6\' 2"  (1.88 m)      Physical Exam  Constitutional:  Sweaty,ill looking  Cardiovascular: Normal rate, normal heart sounds and intact distal pulses.   No murmur heard. Pulmonary/Chest: Effort normal and breath sounds normal. He has no wheezes. He has no rales. He exhibits no tenderness.  Abdominal: Soft. Bowel sounds are normal. He exhibits no distension and no mass. There is no tenderness.  Musculoskeletal: Normal range of motion.  Neurological:  Confused  Skin: He is diaphoretic.          Assessment & Plan:  1. Type 2 diabetes mellitus with diabetic neuropathy, with long-term current use of insulin (HCC) Uncontrolled with A1c of 11.3 Current episode of hypoglycemia with blood sugar of 35 treated with ginger ale and Peanut butter crackers with resulting improvement of blood sugar to 89 with resolution of confusion Continue Levemir 30 units twice daily and NovoLog  sliding scale Review blood sugar log at next visit - POCT glucose (manual entry)  2. Diabetic polyneuropathy associated with type 2 diabetes mellitus (HCC) Uncontrolled  Currently on gabapentin pending Lyrica availability via the patient assistance program - pregabalin (LYRICA) 100 MG capsule; Take 1 capsule (100 mg total) by mouth 2 (two) times daily.  Dispense: 60 capsule; Refill: 5   3. Essential hypertension Continue Lisinopril  4. Amputee, great toe, left (HCC) Stable  5. Anxiety and depression He has underlying anxiety and depression coupled with financial difficulties which precludes him from obtaining his diabetic shoes and obtaining medications. We have spoken with the pharmacy who will refill his medications for free today. Commenced on Cymbalta due to dual effect on pain and depression

## 2017-01-04 NOTE — Patient Instructions (Signed)
Hypoglycemia Hypoglycemia is when the sugar (glucose) level in the blood is too low. Symptoms of low blood sugar may include:  Feeling: ? Hungry. ? Worried or nervous (anxious). ? Sweaty and clammy. ? Confused. ? Dizzy. ? Sleepy. ? Sick to your stomach (nauseous).  Having: ? A fast heartbeat. ? A headache. ? A change in your vision. ? Jerky movements that you cannot control (seizure). ? Nightmares. ? Tingling or no feeling (numbness) around the mouth, lips, or tongue.  Having trouble with: ? Talking. ? Paying attention (concentrating). ? Moving (coordination). ? Sleeping.  Shaking.  Passing out (fainting).  Getting upset easily (irritability).  Low blood sugar can happen to people who have diabetes and people who do not have diabetes. Low blood sugar can happen quickly, and it can be an emergency. Treating Low Blood Sugar Low blood sugar is often treated by eating or drinking something sugary right away. If you can think clearly and swallow safely, follow the 15:15 rule:  Take 15 grams of a fast-acting carb (carbohydrate). Some fast-acting carbs are: ? 1 tube of glucose gel. ? 3 sugar tablets (glucose pills). ? 6-8 pieces of hard candy. ? 4 oz (120 mL) of fruit juice. ? 4 oz (120 mL) of regular (not diet) soda.  Check your blood sugar 15 minutes after you take the carb.  If your blood sugar is still at or below 70 mg/dL (3.9 mmol/L), take 15 grams of a carb again.  If your blood sugar does not go above 70 mg/dL (3.9 mmol/L) after 3 tries, get help right away.  After your blood sugar goes back to normal, eat a meal or a snack within 1 hour.  Treating Very Low Blood Sugar If your blood sugar is at or below 54 mg/dL (3 mmol/L), you have very low blood sugar (severe hypoglycemia). This is an emergency. Do not wait to see if the symptoms will go away. Get medical help right away. Call your local emergency services (911 in the [place] Do not drive yourself to the  hospital. If you have very low blood sugar and you cannot eat or drink, you may need a glucagon shot (injection). A family member or friend should learn how to check your blood sugar and how to give you a glucagon shot. Ask your doctor if you need to have a glucagon shot kit at home. Follow these instructions at home: General instructions  Avoid any diets that cause you to not eat enough food. Talk with your doctor before you start any new diet.  Take over-the-counter and prescription medicines only as told by your doctor.  Limit alcohol to no more than 1 drink per day for nonpregnant women and 2 drinks per day for men. One drink equals 12 oz of beer, 5 oz of wine, or 1 oz of hard liquor.  Keep all follow-up visits as told by your doctor. This is important. If You Have Diabetes:   Make sure you know the symptoms of low blood sugar.  Always keep a source of sugar with you, such as: ? Sugar. ? Sugar tablets. ? Glucose gel. ? Fruit juice. ? Regular soda (not diet soda). ? Milk. ? Hard candy. ? Honey.  Take your medicines as told.  Follow your exercise and meal plan. ? Eat on time. Do not skip meals. ? Follow your sick day plan when you cannot eat or drink normally. Make this plan ahead of time with your doctor.  Check your blood sugar as often   as told by your doctor. Always check before and after exercise.  Share your diabetes care plan with: ? Your work or school. ? People you live with.  Check your pee (urine) for ketones: ? When you are sick. ? As told by your doctor.  Carry a card or wear jewelry that says you have diabetes. If You Have Low Blood Sugar From Other Causes:   Check your blood sugar as often as told by your doctor.  Follow instructions from your doctor about what you cannot eat or drink. Contact a doctor if:  You have trouble keeping your blood sugar in your target range.  You have low blood sugar often. Get help right away if:  You still have  symptoms after you eat or drink something sugary.  Your blood sugar is at or below 54 mg/dL (3 mmol/L).  You have jerky movements that you cannot control.  You pass out. These symptoms may be an emergency. Do not wait to see if the symptoms will go away. Get medical help right away. Call your local emergency services (911 in the [place] Do not drive yourself to the hospital. This information is not intended to replace advice given to you by your health care provider. Make sure you discuss any questions you have with your health care provider. Document Released: 10/14/2009 Document Revised: 12/26/2015 Document Reviewed: 08/23/2015 Elsevier Interactive Patient Education  [employer].

## 2017-01-04 NOTE — Addendum Note (Signed)
Addendum  created 01/04/17 1351 by Arch Methot, MD   Sign clinical note    

## 2017-01-04 NOTE — Progress Notes (Signed)
Patient is here for FU  Patient received peanut butter crackers and a ginger ale along with 3 cups of water in office. Patient has taken 30 units of Levemir after checking his blood sugar this morning at 10:00 and receiving a reading of 202.  Patient denies pain at this time.

## 2017-01-11 ENCOUNTER — Other Ambulatory Visit: Payer: Self-pay | Admitting: Family Medicine

## 2017-01-11 DIAGNOSIS — E1142 Type 2 diabetes mellitus with diabetic polyneuropathy: Secondary | ICD-10-CM

## 2017-01-15 ENCOUNTER — Ambulatory Visit (INDEPENDENT_AMBULATORY_CARE_PROVIDER_SITE_OTHER): Payer: Self-pay | Admitting: Family

## 2017-01-18 ENCOUNTER — Other Ambulatory Visit: Payer: Self-pay | Admitting: Family Medicine

## 2017-01-18 DIAGNOSIS — E1142 Type 2 diabetes mellitus with diabetic polyneuropathy: Secondary | ICD-10-CM

## 2017-01-18 MED ORDER — PREGABALIN 100 MG PO CAPS: 100.0000 mg | ORAL_CAPSULE | Freq: Two times a day (BID) | ORAL | 5 refills | Status: DC

## 2017-01-18 MED ORDER — INSULIN LISPRO 100 UNIT/ML (KWIKPEN)
PEN_INJECTOR | SUBCUTANEOUS | 3 refills | Status: DC
Start: 2017-01-18 — End: 2017-02-05

## 2017-02-04 ENCOUNTER — Other Ambulatory Visit: Payer: Self-pay | Admitting: Family Medicine

## 2017-02-04 MED FILL — GABAPENTIN 300 MG CAPSULE: 300 | 30 days supply | Qty: 60 | Fill #1

## 2017-02-05 ENCOUNTER — Inpatient Hospital Stay: Payer: Self-pay

## 2017-02-05 ENCOUNTER — Other Ambulatory Visit: Payer: Self-pay | Admitting: *Deleted

## 2017-02-05 DIAGNOSIS — E1142 Type 2 diabetes mellitus with diabetic polyneuropathy: Secondary | ICD-10-CM

## 2017-02-05 MED ORDER — PREGABALIN 100 MG PO CAPS: 100.0000 mg | ORAL_CAPSULE | Freq: Two times a day (BID) | ORAL | 3 refills | Status: DC

## 2017-02-05 MED ORDER — INSULIN LISPRO 100 UNIT/ML (KWIKPEN): PEN_INJECTOR | SUBCUTANEOUS | 3 refills | Status: DC

## 2017-02-05 MED ORDER — INSULIN GLARGINE 100 UNIT/ML SOLOSTAR PEN: 30.0000 [IU] | PEN_INJECTOR | Freq: Every day | SUBCUTANEOUS | 3 refills | Status: DC

## 2017-02-05 NOTE — Telephone Encounter (Signed)
PRINTED FOR PASS PROGRAM 

## 2017-02-08 ENCOUNTER — Encounter (HOSPITAL_COMMUNITY): Payer: Self-pay | Admitting: Emergency Medicine

## 2017-02-08 DIAGNOSIS — R51 Headache: Secondary | ICD-10-CM | POA: Insufficient documentation

## 2017-02-08 DIAGNOSIS — Z5321 Procedure and treatment not carried out due to patient leaving prior to being seen by health care provider: Secondary | ICD-10-CM | POA: Diagnosis not present

## 2017-02-08 LAB — CBC WITH DIFFERENTIAL/PLATELET
Basophils Absolute: 0 10*3/uL (ref 0.0–0.1)
Basophils Relative: 1 %
EOS ABS: 0.2 10*3/uL (ref 0.0–0.7)
EOS PCT: 4 %
HCT: 37.5 % — ABNORMAL LOW (ref 39.0–52.0)
HEMOGLOBIN: 12.2 g/dL — AB (ref 13.0–17.0)
LYMPHS ABS: 1.7 10*3/uL (ref 0.7–4.0)
Lymphocytes Relative: 30 %
MCH: 27 pg (ref 26.0–34.0)
MCHC: 32.5 g/dL (ref 30.0–36.0)
MCV: 83 fL (ref 78.0–100.0)
MONOS PCT: 6 %
Monocytes Absolute: 0.3 10*3/uL (ref 0.1–1.0)
Neutro Abs: 3.3 10*3/uL (ref 1.7–7.7)
Neutrophils Relative %: 59 %
PLATELETS: 279 10*3/uL (ref 150–400)
RBC: 4.52 MIL/uL (ref 4.22–5.81)
RDW: 14.6 % (ref 11.5–15.5)
WBC: 5.6 10*3/uL (ref 4.0–10.5)

## 2017-02-08 LAB — BASIC METABOLIC PANEL
Anion gap: 8 (ref 5–15)
BUN: 15 mg/dL (ref 6–20)
CHLORIDE: 103 mmol/L (ref 101–111)
CO2: 24 mmol/L (ref 22–32)
CREATININE: 1.1 mg/dL (ref 0.61–1.24)
Calcium: 9 mg/dL (ref 8.9–10.3)
GFR calc Af Amer: >60 mL/min (ref >60)
GFR calc non Af Amer: >60 mL/min (ref >60)
Glucose, Bld: 146 mg/dL — ABNORMAL HIGH (ref 65–99)
Potassium: 3.9 mmol/L (ref 3.5–5.1)
SODIUM: 135 mmol/L (ref 135–145)

## 2017-02-08 MED ORDER — OXYCODONE-ACETAMINOPHEN 5-325 MG PO TABS
ORAL_TABLET | ORAL | Status: AC
  Filled 2017-02-08: qty 1

## 2017-02-08 MED ORDER — OXYCODONE-ACETAMINOPHEN 5-325 MG PO TABS
1.0000 | ORAL_TABLET | ORAL | Status: DC | PRN
  Administered 2017-02-08: 1 via ORAL

## 2017-02-08 NOTE — ED Triage Notes (Signed)
Pt brought to ED by [facility] from home for 9/10 HA that pt is having for a week, pt fell a week ago and hit his head, was seen on the ED head ct done and negative, EDP oriented pt to stop getting his regular medication, pt is been out of BP meds for a whole week now, no neuro deficit noticed, PT AO x 4 NAD noticed. BP 116/86, HR 86, R, 18, CBG 171.

## 2017-02-09 ENCOUNTER — Emergency Department (HOSPITAL_COMMUNITY)
Admission: EM | Admit: 2017-02-09 | Discharge: 2017-02-09 | Discharge status: Against Medical Advice | Payer: Medicaid Other | Attending: Emergency Medicine | Admitting: Emergency Medicine

## 2017-02-09 NOTE — ED Notes (Signed)
Pt did not answer when called for vitals x 3

## 2017-02-11 ENCOUNTER — Encounter: Payer: Self-pay | Admitting: Physician Assistant

## 2017-02-11 ENCOUNTER — Ambulatory Visit: Payer: Medicaid Other | Attending: Family Medicine | Admitting: Physician Assistant

## 2017-02-11 VITALS — BP 132/75 | HR 89 | Temp 98.0°F | Resp 18 | Ht 78.0 in | Wt 250.0 lb

## 2017-02-11 DIAGNOSIS — Z881 Allergy status to other antibiotic agents status: Secondary | ICD-10-CM | POA: Diagnosis not present

## 2017-02-11 DIAGNOSIS — N189 Chronic kidney disease, unspecified: Secondary | ICD-10-CM | POA: Diagnosis not present

## 2017-02-11 DIAGNOSIS — E1140 Type 2 diabetes mellitus with diabetic neuropathy, unspecified: Secondary | ICD-10-CM | POA: Diagnosis not present

## 2017-02-11 DIAGNOSIS — R51 Headache: Secondary | ICD-10-CM | POA: Diagnosis not present

## 2017-02-11 DIAGNOSIS — I129 Hypertensive chronic kidney disease with stage 1 through stage 4 chronic kidney disease, or unspecified chronic kidney disease: Secondary | ICD-10-CM | POA: Diagnosis not present

## 2017-02-11 DIAGNOSIS — I10 Essential (primary) hypertension: Secondary | ICD-10-CM

## 2017-02-11 DIAGNOSIS — E1142 Type 2 diabetes mellitus with diabetic polyneuropathy: Secondary | ICD-10-CM

## 2017-02-11 DIAGNOSIS — E1122 Type 2 diabetes mellitus with diabetic chronic kidney disease: Secondary | ICD-10-CM | POA: Diagnosis not present

## 2017-02-11 DIAGNOSIS — R519 Headache, unspecified: Secondary | ICD-10-CM

## 2017-02-11 DIAGNOSIS — E7800 Pure hypercholesterolemia, unspecified: Secondary | ICD-10-CM | POA: Diagnosis not present

## 2017-02-11 DIAGNOSIS — Z794 Long term (current) use of insulin: Secondary | ICD-10-CM | POA: Diagnosis not present

## 2017-02-11 DIAGNOSIS — M62838 Other muscle spasm: Secondary | ICD-10-CM | POA: Diagnosis not present

## 2017-02-11 DIAGNOSIS — Z79899 Other long term (current) drug therapy: Secondary | ICD-10-CM | POA: Insufficient documentation

## 2017-02-11 LAB — POCT GLYCOSYLATED HEMOGLOBIN (HGB A1C): HEMOGLOBIN A1C: 8.7

## 2017-02-11 LAB — GLUCOSE, POCT (MANUAL RESULT ENTRY): POC GLUCOSE: 237 mg/dL — AB (ref 70–99)

## 2017-02-11 MED ORDER — NAPROXEN 500 MG PO TABS: 500.0000 mg | ORAL_TABLET | Freq: Two times a day (BID) | ORAL | 0 refills | Status: DC

## 2017-02-11 MED ORDER — LISINOPRIL 5 MG PO TABS: 5.0000 mg | ORAL_TABLET | Freq: Every day | ORAL | 3 refills | Status: DC

## 2017-02-11 MED ORDER — METHOCARBAMOL 500 MG PO TABS: 500.0000 mg | ORAL_TABLET | Freq: Three times a day (TID) | ORAL | 0 refills | Status: DC

## 2017-02-11 MED ORDER — INSULIN ASPART 100 UNIT/ML FLEXPEN: 10.0000 [IU] | PEN_INJECTOR | Freq: Three times a day (TID) | SUBCUTANEOUS | 11 refills | Status: DC

## 2017-02-11 MED ORDER — GABAPENTIN 300 MG PO CAPS: 300.0000 mg | ORAL_CAPSULE | Freq: Two times a day (BID) | ORAL | 0 refills | Status: DC

## 2017-02-11 MED ORDER — PREGABALIN 100 MG PO CAPS: 100.0000 mg | ORAL_CAPSULE | Freq: Two times a day (BID) | ORAL | 3 refills | Status: DC

## 2017-02-11 MED ORDER — DULOXETINE HCL 60 MG PO CPEP: 60.0000 mg | ORAL_CAPSULE | Freq: Every day | ORAL | 3 refills | Status: DC

## 2017-02-11 MED ORDER — ATORVASTATIN CALCIUM 40 MG PO TABS: 40.0000 mg | ORAL_TABLET | Freq: Every day | ORAL | 5 refills | Status: DC

## 2017-02-11 MED FILL — DULoxetine HCL 60 MG CPEP: 60 | 30 days supply | Qty: 30 | Fill #0

## 2017-02-11 MED FILL — METHOCARBAMOL 500 MG TABLET: 500 | 20 days supply | Qty: 60 | Fill #0

## 2017-02-11 MED FILL — ?ATORVASTATIN 40MG TABLET: 40 | 30 days supply | Qty: 30 | Fill #0

## 2017-02-11 MED FILL — NAPROXEN 500 MG TABLET: 500 | 30 days supply | Qty: 60 | Fill #0
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
## 2017-02-11 NOTE — Patient Instructions (Signed)
Check blood sugar fasting and at bedtime and record and bring to next visit.   

## 2017-02-12 MED FILL — !NOVOLOG FLEXPEN SYRINGE 1: 100/ML | 20 days supply | Qty: 6 | Fill #0

## 2017-02-15 ENCOUNTER — Encounter (HOSPITAL_COMMUNITY): Payer: Self-pay | Admitting: *Deleted

## 2017-02-15 ENCOUNTER — Emergency Department (HOSPITAL_COMMUNITY)
Admission: EM | Admit: 2017-02-15 | Discharge: 2017-02-15 | Disposition: A | Discharge status: Home / Self Care | Payer: Medicaid Other | Attending: Emergency Medicine | Admitting: Emergency Medicine

## 2017-02-15 DIAGNOSIS — N183 Chronic kidney disease, stage 3 (moderate): Secondary | ICD-10-CM | POA: Diagnosis not present

## 2017-02-15 DIAGNOSIS — I129 Hypertensive chronic kidney disease with stage 1 through stage 4 chronic kidney disease, or unspecified chronic kidney disease: Secondary | ICD-10-CM | POA: Insufficient documentation

## 2017-02-15 DIAGNOSIS — F17210 Nicotine dependence, cigarettes, uncomplicated: Secondary | ICD-10-CM | POA: Insufficient documentation

## 2017-02-15 DIAGNOSIS — Z79899 Other long term (current) drug therapy: Secondary | ICD-10-CM | POA: Diagnosis not present

## 2017-02-15 DIAGNOSIS — H1031 Unspecified acute conjunctivitis, right eye: Secondary | ICD-10-CM | POA: Insufficient documentation

## 2017-02-15 DIAGNOSIS — Z794 Long term (current) use of insulin: Secondary | ICD-10-CM | POA: Diagnosis not present

## 2017-02-15 DIAGNOSIS — E1140 Type 2 diabetes mellitus with diabetic neuropathy, unspecified: Secondary | ICD-10-CM | POA: Diagnosis not present

## 2017-02-15 DIAGNOSIS — H10021 Other mucopurulent conjunctivitis, right eye: Secondary | ICD-10-CM

## 2017-02-15 MED ORDER — TOBRAMYCIN 0.3 % OP SOLN: 2.0000 [drp] | OPHTHALMIC | 0 refills | Status: DC

## 2017-02-15 MED ORDER — FLUORESCEIN SODIUM 0.6 MG OP STRP
ORAL_STRIP | OPHTHALMIC | Status: AC
  Filled 2017-02-15: qty 1

## 2017-02-15 MED FILL — TOBREX 0.3% EYE DROPS: 0.3 | 25 days supply | Qty: 5 | Fill #0

## 2017-02-15 NOTE — ED Provider Notes (Signed)
[facility]-EMERGENCY DEPT Provider Note   CSN: [id number] Arrival date & time: 02/15/17  1306     History   Chief Complaint Chief Complaint  Patient presents with  . Eye Pain    HPI Ryan Shaw is a 51 y.o. male.HPI Complains of pain in yellowish drainage from right eye which started 3 days ago. Feels as if "Sand in my eye". Vision from right eye is slightly blurry no other associated symptoms. No fever. No treatment prior to coming here. No injury Past Medical History:  Diagnosis Date  . Arthritis   . CKD (chronic kidney disease)   . Diabetes mellitus without complication (HCC)   . Edema   . Hypercholesteremia   . Hypertension   . Neuropathy     Patient Active Problem List   Diagnosis Date Noted  . Anxiety and depression 01/04/2017  . H/O amputation of lesser toe, left (HCC) 12/25/2016  . Hyperlipidemia 12/15/2016  . MRSA infection   . Abscess of bursa of right hip   . Cellulitis of left lower extremity   . Fever   . Right hip pain   . Acute right ankle pain   . Idiopathic chronic gout of left ankle without tophus   . Amputee, great toe, left (HCC) 10/08/2016  . Diabetic foot ulcer (HCC) 10/08/2016  . 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 Acquired contracture of Achilles tendon, left   . Essential hypertension 10/02/2016  . CKD (chronic kidney disease), stage III 10/02/2016  . Bilateral chronic knee pain 07/09/2015  . Type 2 diabetes mellitus with diabetic neuropathy, with long-term current use of insulin (HCC) 11/01/2014  . Diabetic peripheral neuropathy (HCC) 10/25/2014    Past Surgical History:  Procedure Laterality Date  . AMPUTATION Right 02/17/2015   Procedure: SECOND RAY AMPUTATION;  Surgeon: Kathryne Hitchhristopher Y Blackman, MD;  [facility];  Service: Orthopedics;  Laterality: Right;  . AMPUTATION Left 10/04/2016   Procedure: GREAT TOE AMPUTATION GASTROCNEMIA RESECTION;  Surgeon: Nadara MustardMarcus Duda V, MD;  Location: [facility] OR;  Service: Orthopedics;   Laterality: Left;  . AMPUTATION Left 12/16/2016   Procedure: LEFT FOOT 2ND TOE AMPUTATION;  Surgeon: Nadara Mustarduda, Marcus V, MD;  [facility];  Service: Orthopedics;  Laterality: Left;  . HIP SURGERY Right   . INCISION AND DRAINAGE HIP Right 11/12/2016   Procedure: IRRIGATION AND DEBRIDEMENT RIGHT HIP;  Surgeon: Nadara MustardMarcus Duda V, MD;  Location: [facility] OR;  Service: Orthopedics;  Laterality: Right;       Home Medications    Prior to Admission medications   Medication Sig Start Date End Date Taking? Authorizing Provider  atorvastatin (LIPITOR) 40 MG tablet Take 1 tablet (40 mg total) by mouth at bedtime. 02/11/17   Anders SimmondsMcClung, Angela M, PA-C  DULoxetine (CYMBALTA) 60 MG capsule Take 1 capsule (60 mg total) by mouth daily. 02/11/17   Anders SimmondsMcClung, Angela M, PA-C  gabapentin (NEURONTIN) 300 MG capsule Take 1 capsule (300 mg total) by mouth 2 (two) times daily. Stop once Lyrica arrives 02/11/17   Georgian CoMcClung, Angela M, PA-C  insulin aspart (NOVOLOG) 100 UNIT/ML FlexPen Inject 10 Units into the skin 3 (three) times daily with meals. 02/11/17   Anders SimmondsMcClung, Angela M, PA-C  insulin detemir (LEVEMIR) 100 UNIT/ML injection Inject 0.3 mLs (30 Units total) into the skin 2 (two) times daily. 11/25/16   Jaclyn ShaggyAmao, Enobong, MD  insulin detemir (LEVEMIR) 100 UNIT/ML injection Inject 30 Units into the skin 2 (two) times daily.    [provider]  Insulin Syringes, Disposable, U-100 1 ML MISC 1 each by Does not apply route 3 (three) times daily. 12/10/16   Jaclyn Shaggy, MD  lisinopril (PRINIVIL,ZESTRIL) 5 MG tablet Take 1 tablet (5 mg total) by mouth at bedtime. 02/11/17   Anders Simmonds, PA-C  methocarbamol (ROBAXIN) 500 MG tablet Take 1 tablet (500 mg total) by mouth 3 (three) times daily. X 7 days then prn muscle spasm/neck pain/headache 02/11/17   Anders Simmonds, PA-C  naproxen (NAPROSYN) 500 MG tablet Take 1 tablet (500 mg total) by mouth 2 (two) times daily with a meal. X 7 days then prn pain/headache 02/11/17   Anders Simmonds,  PA-C  polyethylene glycol powder (GLYCOLAX/MIRALAX) powder Take 17 g by mouth 2 (two) times daily as needed. Patient taking differently: Take 17 g by mouth 2 (two) times daily as needed for mild constipation.  11/25/16   Jaclyn Shaggy, MD  pregabalin (LYRICA) 100 MG capsule Take 1 capsule (100 mg total) by mouth 2 (two) times daily. 02/11/17   Anders Simmonds, PA-C  sildenafil (VIAGRA) 25 MG tablet Take 1 tablet (25 mg total) by mouth daily as needed for erectile dysfunction. Patient taking differently: Take 100 mg by mouth daily as needed for erectile dysfunction.  06/07/15   Quentin Angst, MD  traMADol (ULTRAM) 50 MG tablet Take 2 tablets (100 mg total) by mouth every 6 (six) hours as needed for moderate pain. 12/17/16   Rodolph Bong, MD    Family History Family History  Problem Relation Age of Onset  . Cancer Mother   . Aneurysm Father        brain    Social History Social History  Substance Use Topics  . Smoking status: Current Every Day Smoker    Packs/day: 0.10    Types: Cigarettes  . Smokeless tobacco: Never Used     Comment: 3 cigs daily  . Alcohol use No     Comment: patient states he quit drinking since he has been sick     Allergies   Other and Vancomycin   Review of Systems Review of Systems  Constitutional: Negative.   Eyes: Positive for pain, discharge, redness, itching and visual disturbance.  Cardiovascular:       Syncope  Skin: Negative.   Allergic/Immunologic: Positive for immunocompromised state.       Diabetic  All other systems reviewed and are negative.    Physical Exam Updated Vital Signs BP 125/82 (BP Location: Left Arm)   Pulse 94   Temp 98.7 F (37.1 C) (Oral)   Resp 16   SpO2 100%   Physical Exam  Constitutional: He is oriented to person, place, and time. He appears well-developed and well-nourished.  HENT:  Head: Normocephalic and atraumatic.  Right Ear: External ear normal.  Left Ear: External ear normal.  Nose:  Nose normal.  Eyes: Right eye exhibits discharge.  Right eye with subconjunctival erythema. Yellowish discharge on lashes. No pain on extraocular movement. Fluorescein negative. Left eye normal. Visual acuity noted  Neck: Normal range of motion. Neck supple.  Cardiovascular: Normal rate.   Pulmonary/Chest: Effort normal.  Abdominal: Soft.  Neurological: He is alert and oriented to person, place, and time.  Nursing note and vitals reviewed.    ED Treatments / Results  Labs (all labs ordered are listed, but only abnormal results are displayed) Labs Reviewed - No data to display  EKG  EKG Interpretation None       Radiology No results found.  Procedures Procedures (including critical care time)  Medications Ordered in ED Medications  fluorescein 0.6 MG ophthalmic strip (not administered)     Initial Impression / Assessment and Plan / ED Course  I have reviewed the triage vital signs and the nursing notes.  Pertinent labs & imaging results that were available during my care of the patient were reviewed by me and considered in my medical decision making (see chart for details).     Plan prescription for tobramycin eyedrops. Referral Dr.Groat  Final Clinical Impressions(s) / ED Diagnoses  Diagnosis conjunctivitis of right eye Final diagnoses:  None    New Prescriptions New Prescriptions   No medications on file     Doug Sou, MD 02/15/17 1547

## 2017-02-15 NOTE — Discharge Instructions (Signed)
Call Dr. Dione BoozeGroat schedule office visit if not improving in 2 or 3 days. It is safe to take Tylenol as directed for pain

## 2017-02-15 NOTE — ED Triage Notes (Signed)
To ED for eval of right eye pain, redness, and itching for past week. Getting worse. Woke with eye matted shut.

## 2017-02-15 NOTE — ED Notes (Signed)
Pt states eye pain and drainage since friday

## 2017-03-05 ENCOUNTER — Ambulatory Visit: Payer: Self-pay | Admitting: Family Medicine

## 2017-03-05 ENCOUNTER — Ambulatory Visit: Payer: Self-pay

## 2017-03-08 ENCOUNTER — Ambulatory Visit: Payer: Medicaid Other

## 2017-03-11 ENCOUNTER — Emergency Department (HOSPITAL_BASED_OUTPATIENT_CLINIC_OR_DEPARTMENT_OTHER)
Admit: 2017-03-11 | Discharge: 2017-03-11 | Disposition: A | Discharge status: Home / Self Care | Payer: Medicaid Other | Attending: Emergency Medicine | Admitting: Emergency Medicine

## 2017-03-11 ENCOUNTER — Encounter (HOSPITAL_COMMUNITY): Payer: Self-pay | Admitting: Emergency Medicine

## 2017-03-11 ENCOUNTER — Emergency Department (HOSPITAL_COMMUNITY)
Admission: EM | Admit: 2017-03-11 | Discharge: 2017-03-11 | Disposition: A | Discharge status: Home / Self Care | Payer: Medicaid Other | Attending: Emergency Medicine | Admitting: Emergency Medicine

## 2017-03-11 DIAGNOSIS — F17210 Nicotine dependence, cigarettes, uncomplicated: Secondary | ICD-10-CM | POA: Diagnosis not present

## 2017-03-11 DIAGNOSIS — Z89422 Acquired absence of other left toe(s): Secondary | ICD-10-CM | POA: Diagnosis not present

## 2017-03-11 DIAGNOSIS — Z794 Long term (current) use of insulin: Secondary | ICD-10-CM | POA: Insufficient documentation

## 2017-03-11 DIAGNOSIS — Z79899 Other long term (current) drug therapy: Secondary | ICD-10-CM | POA: Insufficient documentation

## 2017-03-11 DIAGNOSIS — E1122 Type 2 diabetes mellitus with diabetic chronic kidney disease: Secondary | ICD-10-CM | POA: Insufficient documentation

## 2017-03-11 DIAGNOSIS — M79609 Pain in unspecified limb: Secondary | ICD-10-CM

## 2017-03-11 DIAGNOSIS — I129 Hypertensive chronic kidney disease with stage 1 through stage 4 chronic kidney disease, or unspecified chronic kidney disease: Secondary | ICD-10-CM | POA: Diagnosis not present

## 2017-03-11 DIAGNOSIS — M79605 Pain in left leg: Secondary | ICD-10-CM | POA: Diagnosis present

## 2017-03-11 DIAGNOSIS — N182 Chronic kidney disease, stage 2 (mild): Secondary | ICD-10-CM | POA: Diagnosis not present

## 2017-03-11 NOTE — ED Notes (Signed)
Vascular tech is aware of patient 

## 2017-03-11 NOTE — Discharge Instructions (Signed)
Continue current medications. Follow-up with your primary care doctor. Monitor for fevers chills, worsening symptoms

## 2017-03-11 NOTE — ED Notes (Signed)
Pt understood dc material. NAD noted. 

## 2017-03-11 NOTE — ED Triage Notes (Signed)
Pt to ER with complaint of left posterior calf pain onset 3 days while at rest. States positive hx of blood clots. Recent trip to [place] 3 weeks ago.

## 2017-03-11 NOTE — Progress Notes (Signed)
VASCULAR LAB PRELIMINARY  PRELIMINARY  PRELIMINARY  PRELIMINARY  Left lower extremity venous duplex completed.    Preliminary report:  Left:  No evidence of DVT, superficial thrombosis, or Baker's cyst. No change from study of 11/28/2016.  Anitta Tenny, RVS 03/11/2017, 6:21 PM

## 2017-03-11 NOTE — ED Provider Notes (Signed)
[facility]-EMER[facility] DEPT Provider Note   CSN: [id number] Arrival date & time: 03/11/17  1537     History   Chief Complaint Chief Complaint  Patient presents with  . Leg Pain    HPI Ryan Shaw is a 51 y.o. male.  HPI Patient presents to the emergency room for evaluation of left leg pain. Patient states he started having severe pain in his left leg, calf down to his foot earlier today. The symptoms have now resolved. He is not having any pain any longer. He denies any chest pain or shortness of breath. He has not had any injuries. He has a history of neuropathy as well as diabetes. Patient has had amputation of his toes due to infections associated with his diabetes. Patient was concerned about the possibility of a blood clot. Past Medical History:  Diagnosis Date  . Arthritis   . CKD (chronic kidney disease)   . Diabetes mellitus without complication (HCC)   . Edema   . Hypercholesteremia   . Hypertension   . Neuropathy     Patient Active Problem List   Diagnosis Date Noted  . Anxiety and depression 01/04/2017  . H/O amputation of lesser toe, left (HCC) 12/25/2016  . Hyperlipidemia 12/15/2016  . MRSA infection   . Abscess of bursa of right hip   . Cellulitis of left lower extremity   . Fever   . Right hip pain   . Acute right ankle pain   . Idiopathic chronic gout of left ankle without tophus   . Amputee, great toe, left (HCC) 10/08/2016  . Diabetic foot ulcer (HCC) 10/08/2016  . 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 Acquired contracture of Achilles tendon, left   . Essential hypertension 10/02/2016  . CKD (chronic kidney disease), stage III 10/02/2016  . Bilateral chronic knee pain 07/09/2015  . Type 2 diabetes mellitus with diabetic neuropathy, with long-term current use of insulin (HCC) 11/01/2014  . Diabetic peripheral neuropathy (HCC) 10/25/2014    Past Surgical History:  Procedure Laterality Date  . AMPUTATION Right 02/17/2015   Procedure:  SECOND RAY AMPUTATION;  Surgeon: Kathryne Hitch, MD;  Location: [facility] OR;  Service: Orthopedics;  Laterality: Right;  . AMPUTATION Left 10/04/2016   Procedure: GREAT TOE AMPUTATION GASTROCNEMIA RESECTION;  Surgeon: Nadara Mustard, MD;  Location: [facility] OR;  Service: Orthopedics;  Laterality: Left;  . AMPUTATION Left 12/16/2016   Procedure: LEFT FOOT 2ND TOE AMPUTATION;  Surgeon: Nadara Mustard, MD;  Location: [facility] OR;  Service: Orthopedics;  Laterality: Left;  . HIP SURGERY Right   . INCISION AND DRAINAGE HIP Right 11/12/2016   Procedure: IRRIGATION AND DEBRIDEMENT RIGHT HIP;  Surgeon: Nadara Mustard, MD;  Location: [facility] OR;  Service: Orthopedics;  Laterality: Right;       Home Medications    Prior to Admission medications   Medication Sig Start Date End Date Taking? Authorizing Provider  atorvastatin (LIPITOR) 40 MG tablet Take 1 tablet (40 mg total) by mouth at bedtime. 02/11/17   Anders Simmonds, PA-C  DULoxetine (CYMBALTA) 60 MG capsule Take 1 capsule (60 mg total) by mouth daily. 02/11/17   Anders Simmonds, PA-C  gabapentin (NEURONTIN) 300 MG capsule Take 1 capsule (300 mg total) by mouth 2 (two) times daily. Stop once Lyrica arrives 02/11/17   Georgian Co M, PA-C  insulin aspart (NOVOLOG) 100 UNIT/ML FlexPen Inject 10 Units into the skin 3 (three) times daily with meals. 02/11/17   Georgian Co  M, PA-C  insulin detemir (LEVEMIR) 100 UNIT/ML injection Inject 0.3 mLs (30 Units total) into the skin 2 (two) times daily. 11/25/16   Jaclyn ShaggyAmao, Enobong, MD  insulin detemir (LEVEMIR) 100 UNIT/ML injection Inject 30 Units into the skin 2 (two) times daily.    [provider]  Insulin Syringes, Disposable, U-100 1 ML MISC 1 each by Does not apply route 3 (three) times daily. 12/10/16   Jaclyn ShaggyAmao, Enobong, MD  lisinopril (PRINIVIL,ZESTRIL) 5 MG tablet Take 1 tablet (5 mg total) by mouth at bedtime. 02/11/17   Anders SimmondsMcClung, Angela M, PA-C  methocarbamol (ROBAXIN) 500 MG tablet Take 1 tablet (500 mg total) by  mouth 3 (three) times daily. X 7 days then prn muscle spasm/neck pain/headache 02/11/17   Anders SimmondsMcClung, Angela M, PA-C  naproxen (NAPROSYN) 500 MG tablet Take 1 tablet (500 mg total) by mouth 2 (two) times daily with a meal. X 7 days then prn pain/headache 02/11/17   Anders SimmondsMcClung, Angela M, PA-C  polyethylene glycol powder (GLYCOLAX/MIRALAX) powder Take 17 g by mouth 2 (two) times daily as needed. Patient taking differently: Take 17 g by mouth 2 (two) times daily as needed for mild constipation.  11/25/16   Jaclyn ShaggyAmao, Enobong, MD  pregabalin (LYRICA) 100 MG capsule Take 1 capsule (100 mg total) by mouth 2 (two) times daily. 02/11/17   Anders SimmondsMcClung, Angela M, PA-C  sildenafil (VIAGRA) 25 MG tablet Take 1 tablet (25 mg total) by mouth daily as needed for erectile dysfunction. Patient taking differently: Take 100 mg by mouth daily as needed for erectile dysfunction.  06/07/15   Quentin AngstJegede, Olugbemiga E, MD  tobramycin (TOBREX) 0.3 % ophthalmic solution Place 2 drops into the right eye every 4 (four) hours. 02/15/17   Doug SouJacubowitz, Sam, MD  traMADol (ULTRAM) 50 MG tablet Take 2 tablets (100 mg total) by mouth every 6 (six) hours as needed for moderate pain. 12/17/16   Rodolph Bonghompson, Daniel V, MD    Family History Family History  Problem Relation Age of Onset  . Cancer Mother   . Aneurysm Father        brain    Social History Social History  Substance Use Topics  . Smoking status: Current Every Day Smoker    Packs/day: 0.10    Types: Cigarettes  . Smokeless tobacco: Never Used     Comment: 3 cigs daily  . Alcohol use No     Comment: patient states he quit drinking since he has been sick     Allergies   Other and Vancomycin   Review of Systems Review of Systems  All other systems reviewed and are negative.    Physical Exam Updated Vital Signs BP 129/89   Pulse 85   Temp 98 F (36.7 C) (Oral)   Resp 18   SpO2 100%   Physical Exam  Constitutional: He appears well-developed and well-nourished. No distress.    HENT:  Head: Normocephalic and atraumatic.  Right Ear: External ear normal.  Left Ear: External ear normal.  Eyes: Conjunctivae are normal. Right eye exhibits no discharge. Left eye exhibits no discharge. No scleral icterus.  Neck: Neck supple. No tracheal deviation present.  Cardiovascular: Normal rate, regular rhythm and normal heart sounds.   Pulmonary/Chest: Effort normal and breath sounds normal. No stridor. No respiratory distress.  Abdominal: He exhibits no distension.  Musculoskeletal: He exhibits no edema.  Status post partial amputation of the toes and his left foot foot is warm and well perfused, strong dorsalis pedis pulses, no calf tenderness, no  crepitus, no bony tenderness  Neurological: He is alert. Cranial nerve deficit: no gross deficits.  Skin: Skin is warm and dry. No rash noted.  Psychiatric: He has a normal mood and affect.  Nursing note and vitals reviewed.    ED Treatments / Results    Radiology No results found.  Procedures Procedures (including critical care time) DVT study negative per vascular tech VASCULAR LAB PRELIMINARY  PRELIMINARY  PRELIMINARY  PRELIMINARY  Left lower extremity venous duplex completed.    Preliminary report:  Left:  No evidence of DVT, superficial thrombosis, or Baker's cyst. No change from study of 11/28/2016.  SLAUGHTER, [place], RVS 03/11/2017, 6:21 PM Medications Ordered in ED Medications - No data to display   Initial Impression / Assessment and Plan / ED Course  I have reviewed the triage vital signs and the nursing notes.  Pertinent labs & imaging results that were available during my care of the patient were reviewed by me and considered in my medical decision making (see chart for details).   the patient's exam is reassuring. No signs of infection. He has good perfusion. It's possible his symptoms were related to his peripheral neuropathy. The patient states he is feeling well and is ready to go home.  At this  time there does not appear to be any evidence of an acute emergency medical condition and the patient appears stable for discharge with appropriate outpatient follow up.   Final Clinical Impressions(s) / ED Diagnoses   Final diagnoses:  Left leg pain    New Prescriptions New Prescriptions   No medications on file     Linwood Dibbles, MD 03/11/17 1949

## 2017-03-15 ENCOUNTER — Ambulatory Visit: Payer: Medicaid Other | Attending: Family Medicine

## 2017-04-21 MED FILL — GABAPENTIN 300 MG CAPSULE: 300 | 30 days supply | Qty: 60 | Fill #0

## 2017-04-21 MED FILL — LISINOPRIL 5 MG TAB: 5 | 30 days supply | Qty: 30 | Fill #0

## 2017-04-22 ENCOUNTER — Encounter: Payer: Self-pay | Admitting: Family Medicine

## 2017-04-22 ENCOUNTER — Ambulatory Visit: Payer: Medicaid Other | Attending: Family Medicine | Admitting: Family Medicine

## 2017-04-22 VITALS — BP 120/71 | HR 97 | Temp 98.4°F | Ht 78.0 in | Wt 241.4 lb

## 2017-04-22 DIAGNOSIS — E1140 Type 2 diabetes mellitus with diabetic neuropathy, unspecified: Secondary | ICD-10-CM

## 2017-04-22 DIAGNOSIS — N189 Chronic kidney disease, unspecified: Secondary | ICD-10-CM | POA: Diagnosis not present

## 2017-04-22 DIAGNOSIS — Z794 Long term (current) use of insulin: Secondary | ICD-10-CM | POA: Diagnosis not present

## 2017-04-22 DIAGNOSIS — M1990 Unspecified osteoarthritis, unspecified site: Secondary | ICD-10-CM | POA: Diagnosis not present

## 2017-04-22 DIAGNOSIS — G8929 Other chronic pain: Secondary | ICD-10-CM | POA: Diagnosis not present

## 2017-04-22 DIAGNOSIS — I10 Essential (primary) hypertension: Secondary | ICD-10-CM

## 2017-04-22 DIAGNOSIS — M25561 Pain in right knee: Secondary | ICD-10-CM

## 2017-04-22 DIAGNOSIS — M542 Cervicalgia: Secondary | ICD-10-CM | POA: Diagnosis present

## 2017-04-22 DIAGNOSIS — Z881 Allergy status to other antibiotic agents status: Secondary | ICD-10-CM | POA: Diagnosis not present

## 2017-04-22 DIAGNOSIS — I129 Hypertensive chronic kidney disease with stage 1 through stage 4 chronic kidney disease, or unspecified chronic kidney disease: Secondary | ICD-10-CM | POA: Diagnosis not present

## 2017-04-22 DIAGNOSIS — F329 Major depressive disorder, single episode, unspecified: Secondary | ICD-10-CM | POA: Diagnosis not present

## 2017-04-22 DIAGNOSIS — M25562 Pain in left knee: Secondary | ICD-10-CM

## 2017-04-22 DIAGNOSIS — Z79899 Other long term (current) drug therapy: Secondary | ICD-10-CM | POA: Insufficient documentation

## 2017-04-22 DIAGNOSIS — F419 Anxiety disorder, unspecified: Secondary | ICD-10-CM

## 2017-04-22 DIAGNOSIS — E1122 Type 2 diabetes mellitus with diabetic chronic kidney disease: Secondary | ICD-10-CM | POA: Diagnosis not present

## 2017-04-22 LAB — GLUCOSE, POCT (MANUAL RESULT ENTRY): POC GLUCOSE: 398 mg/dL — AB (ref 70–99)

## 2017-04-22 MED ORDER — GABAPENTIN 300 MG PO CAPS: 300.0000 mg | ORAL_CAPSULE | Freq: Two times a day (BID) | ORAL | 0 refills | Status: DC

## 2017-04-22 MED ORDER — DULOXETINE HCL 60 MG PO CPEP: 60.0000 mg | ORAL_CAPSULE | Freq: Every day | ORAL | 3 refills | Status: DC

## 2017-04-22 MED ORDER — CYCLOBENZAPRINE HCL 10 MG PO TABS: 10.0000 mg | ORAL_TABLET | Freq: Two times a day (BID) | ORAL | 2 refills | Status: DC | PRN

## 2017-04-22 MED ORDER — INSULIN GLARGINE 100 UNIT/ML SOLOSTAR PEN: 30.0000 [IU] | PEN_INJECTOR | Freq: Two times a day (BID) | SUBCUTANEOUS | 11 refills | Status: DC

## 2017-04-22 MED ORDER — INSULIN DETEMIR 100 UNIT/ML ~~LOC~~ SOLN: 30.0000 [IU] | Freq: Two times a day (BID) | SUBCUTANEOUS | 5 refills | Status: DC

## 2017-04-22 MED ORDER — NAPROXEN 500 MG PO TABS: 500.0000 mg | ORAL_TABLET | Freq: Two times a day (BID) | ORAL | 1 refills | Status: DC

## 2017-04-22 MED FILL — TRUEPLUS SYR 1ML 31GX5/16": 31G X 5/16" | 30 days supply | Qty: 100 | Fill #2

## 2017-04-22 MED FILL — ?DULOXETINE HCL DR 60 MG CA: 60 MG | 30 days supply | Qty: 30 | Fill #0

## 2017-04-22 MED FILL — TRUEPLUS SYR 1ML 31GX5/16: 31G X 5/16" | 30 days supply | Qty: 100 | Fill #2

## 2017-04-22 MED FILL — NAPROXEN 500 MG TABLET: 500 | 30 days supply | Qty: 60 | Fill #0

## 2017-04-22 MED FILL — CYCLOBENZAPRINE 10 MG TAB: 10 | 25 days supply | Qty: 50 | Fill #0

## 2017-04-22 MED FILL — $LANTUS SOLOSTAR 100 UNITS/: 100 | 25 days supply | Qty: 15 | Fill #0

## 2017-04-22 NOTE — Progress Notes (Signed)
Subjective:  Patient ID: Ryan Shaw, male    DOB: April 12, 1966  Age: 51 y.o. 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  At his last office visit he was treated for an episode of hypoglycemia and he denies any repeat of such. He has not been compliant with his insulins due to financial constraints. He also has not been compliant with a diabetic diet. He complains of worsening neuropathy which he describes as pain in his legs with associated numbness and has been out of his gabapentin.  His knees give out on him and he does have intermittent knee pain rated as moderate but denies swelling; received cortisone injections in the past with some improvement. He has also been out of his muscle relaxant which he takes for intermittent neck pain.  Past Medical History:  Diagnosis Date  . Arthritis   . CKD (chronic kidney disease)   . Diabetes mellitus without complication (HCC)   . Edema   . Hypercholesteremia   . Hypertension   . Neuropathy     Past Surgical History:  Procedure Laterality Date  . AMPUTATION Right 02/17/2015   Procedure: SECOND RAY AMPUTATION;  Surgeon: Kathryne Hitch, MD;  Location: [facility] OR;  Service: Orthopedics;  Laterality: Right;  . AMPUTATION Left 10/04/2016   Procedure: GREAT TOE AMPUTATION GASTROCNEMIA RESECTION;  Surgeon: Nadara Mustard, MD;  Location: [facility] OR;  Service: Orthopedics;  Laterality: Left;  . AMPUTATION Left 12/16/2016   Procedure: LEFT FOOT 2ND TOE AMPUTATION;  Surgeon: Nadara Mustard, MD;  Location: [facility] OR;  Service: Orthopedics;  Laterality: Left;  . HIP SURGERY Right   . INCISION AND DRAINAGE HIP Right 11/12/2016   Procedure: IRRIGATION AND DEBRIDEMENT RIGHT HIP;  Surgeon:  Nadara Mustard, MD;  Location: [facility] OR;  Service: Orthopedics;  Laterality: Right;    Allergies  Allergen Reactions  . Other Nausea And Vomiting    NeuRemedy - Dietary Supplement  . Vancomycin Other (See Comments)    Given after surgery in January 2018 at [facility] - shut kidneys down per pt     Outpatient Medications Prior to Visit  Medication Sig Dispense Refill  . atorvastatin (LIPITOR) 40 MG tablet Take 1 tablet (40 mg total) by mouth at bedtime. 30 tablet 5  . insulin aspart (NOVOLOG) 100 UNIT/ML FlexPen Inject 10 Units into the skin 3 (three) times daily with meals. 15 mL 11  . Insulin Syringes, Disposable, U-100 1 ML MISC 1 each by Does not apply route 3 (three) times daily. 100 each 12  . lisinopril (PRINIVIL,ZESTRIL) 5 MG tablet Take 1 tablet (5 mg total) by mouth at bedtime. 30 tablet 3  . gabapentin (NEURONTIN) 300 MG capsule Take 1 capsule (300 mg total) by mouth 2 (two) times daily. Stop once Lyrica arrives 60 capsule 0  . insulin detemir (LEVEMIR) 100 UNIT/ML injection Inject 0.3 mLs (30 Units total) into the skin 2 (two) times daily. 30 mL 5  . polyethylene glycol powder (GLYCOLAX/MIRALAX) powder Take 17 g by mouth 2 (two) times daily as needed. (Patient not taking: Reported on 04/22/2017) 3350 g 1  . pregabalin (LYRICA) 100 MG capsule Take 1 capsule (100 mg total) by mouth 2 (two) times  daily. (Patient not taking: Reported on 04/22/2017) 180 capsule 3  . sildenafil (VIAGRA) 25 MG tablet Take 1 tablet (25 mg total) by mouth daily as needed for erectile dysfunction. (Patient not taking: Reported on 04/22/2017) 30 tablet 3  . tobramycin (TOBREX) 0.3 % ophthalmic solution Place 2 drops into the right eye every 4 (four) hours. (Patient not taking: Reported on 04/22/2017) 5 mL 0  . traMADol (ULTRAM) 50 MG tablet Take 2 tablets (100 mg total) by mouth every 6 (six) hours as needed for moderate pain. (Patient not taking: Reported on 04/22/2017) 20 tablet 0  .  DULoxetine (CYMBALTA) 60 MG capsule Take 1 capsule (60 mg total) by mouth daily. (Patient not taking: Reported on 04/22/2017) 30 capsule 3  . insulin detemir (LEVEMIR) 100 UNIT/ML injection Inject 30 Units into the skin 2 (two) times daily.    . methocarbamol (ROBAXIN) 500 MG tablet Take 1 tablet (500 mg total) by mouth 3 (three) times daily. X 7 days then prn muscle spasm/neck pain/headache (Patient not taking: Reported on 04/22/2017) 60 tablet 0  . naproxen (NAPROSYN) 500 MG tablet Take 1 tablet (500 mg total) by mouth 2 (two) times daily with a meal. X 7 days then prn pain/headache (Patient not taking: Reported on 04/22/2017) 60 tablet 0   No facility-administered medications prior to visit.     ROS Review of Systems  Constitutional: Negative for activity change and appetite change.  HENT: Negative for sinus pressure and sore throat.   Eyes: Negative for visual disturbance.  Respiratory: Negative for cough, chest tightness and shortness of breath.   Cardiovascular: Negative for chest pain and leg swelling.  Gastrointestinal: Negative for abdominal distention, abdominal pain, constipation and diarrhea.  Endocrine: Negative.   Genitourinary: Negative for dysuria.  Musculoskeletal:       See hpi  Skin: Negative for rash.  Allergic/Immunologic: Negative.   Neurological: Positive for numbness. Negative for weakness and light-headedness.  Psychiatric/Behavioral: Negative for dysphoric mood and suicidal ideas.    Objective:  BP 120/71   Pulse 97   Temp 98.4 F (36.9 C) (Oral)   Ht  (1.981 m)   Wt 241 lb 6.4 oz (109.5 kg)   SpO2 97%   BMI 27.90 kg/m   BP/Weight 04/22/2017 03/11/2017 02/15/2017  Systolic BP 120 129 118  Diastolic BP 71 89 76  Wt. (Lbs) 241.4 - -  BMI 27.9 - -      Physical Exam  Constitutional: He is oriented to person, place, and time. He appears well-developed and well-nourished.  Cardiovascular: Normal rate, normal heart sounds and intact distal pulses.     No murmur heard. Pulmonary/Chest: Effort normal and breath sounds normal. He has no wheezes. He has no rales. He exhibits no tenderness.  Abdominal: Soft. Bowel sounds are normal. He exhibits no distension and no mass. There is no tenderness.  Musculoskeletal: Normal range of motion. He exhibits tenderness (Tenderness on palpation of trapezius muscle bilaterally; restricted lateral rotation of the neck). He exhibits no edema.  Tenderness on flexion and extension of both knees Crepitus present on range of motion  Neurological: He is alert and oriented to person, place, and time.     CMP Latest Ref Rng & Units 02/08/2017 12/17/2016 12/16/2016  Glucose 65 - 99 mg/dL 161(W) 960(A) 540(J)  BUN 6 - 20 mg/dL Creatinine 0.61 - 1.24 mg/dL 8.11 9.14 7.82  Sodium 135 - 145 mmol/L 135 134(L) 134(L)  Potassium 3.5 - 5.1 mmol/L 3.9 4.4  4.5  Chloride 101 - 111 mmol/L 103 103 103  CO2 22 - 32 mmol/L Calcium 8.9 - 10.3 mg/dL 9.0 1.6(X) 8.9  Total Protein 6.5 - 8.1 g/dL - - -  Total Bilirubin 0.3 - 1.2 mg/dL - - -  Alkaline Phos 38 - 126 U/L - - -  AST 15 - 41 U/L - - -  ALT 17 - 63 U/L - - -    Lab Results  Component Value Date   HGBA1C 8.7 02/11/2017    Assessment & Plan:   1. Type 2 diabetes mellitus with diabetic neuropathy, with long-term current use of insulin (HCC) Uncontrolled with A1c of 8.7 majorly due to noncompliance I have refilled his medications and ensured that the pharmacy has refills on file. Diabetic diet, lifestyle modification - POCT glucose (manual entry)  2. Essential hypertension Controlled Low-sodium diet  3. Bilateral chronic knee pain Underlying osteoarthritis Advised to use knee brace Refilled NSAIDs If symptoms persist we will consider corticosteroid injections - naproxen (NAPROSYN) 500 MG tablet; Take 1 tablet (500 mg total) by mouth 2 (two) times daily with a meal.  Dispense: 60 tablet; Refill: 1  4. Anxiety and depression Controlled  on Cymbalta  5. Neck pain Uncontrolled due to running out of Robaxin I have replaced this with Flexeril due to the former being on back order   Meds ordered this encounter  Medications  . gabapentin (NEURONTIN) 300 MG capsule    Sig: Take 1 capsule (300 mg total) by mouth 2 (two) times daily. Stop once Lyrica arrives    Dispense:  60 capsule    Refill:  0    Pending availability of Lyrica via patient assistance program  . cyclobenzaprine (FLEXERIL) 10 MG tablet    Sig: Take 1 tablet (10 mg total) by mouth 2 (two) times daily as needed for muscle spasms.    Dispense:  50 tablet    Refill:  2  . DULoxetine (CYMBALTA) 60 MG capsule    Sig: Take 1 capsule (60 mg total) by mouth daily.    Dispense:  30 capsule    Refill:  3  . naproxen (NAPROSYN) 500 MG tablet    Sig: Take 1 tablet (500 mg total) by mouth 2 (two) times daily with a meal.    Dispense:  60 tablet    Refill:  1  . insulin detemir (LEVEMIR) 100 UNIT/ML injection    Sig: Inject 0.3 mLs (30 Units total) into the skin 2 (two) times daily.    Dispense:  30 mL    Refill:  5    Discontinue previous dose    Follow-up: Return in about 2 months (around 06/22/2017) for Follow-up of diabetes and hypertension.   Jaclyn Shaggy MD

## 2017-04-22 NOTE — Patient Instructions (Signed)
Diabetic Neuropathy Diabetic neuropathy is a nerve disease or nerve damage that is caused by diabetes mellitus. About half of all people with diabetes mellitus have some form of nerve damage. Nerve damage is more common in those who have had diabetes mellitus for many years and who generally have not had good control of their blood sugar (glucose) level. Diabetic neuropathy is a common complication of diabetes mellitus. There are three common types of diabetic neuropathy and a fourth type that is less common and less understood:  Peripheral neuropathy-This is the most common type of diabetic neuropathy. It causes damage to the nerves of the feet and legs first and then eventually the hands and arms. The damage affects the ability to sense touch.  Autonomic neuropathy-This type causes damage to the autonomic nervous system, which controls the following functions: ? Heartbeat. ? Body temperature. ? Blood pressure. ? Urination. ? Digestion. ? Sweating. ? Sexual function.  Focal neuropathy-Focal neuropathy can be painful and unpredictable and occurs most often in older adults with diabetes mellitus. It involves a specific nerve or one area and often comes on suddenly. It usually does not cause long-term problems.  Radiculoplexus neuropathy- Sometimes called lumbosacral radiculoplexus neuropathy, radiculoplexus neuropathy affects the nerves of the thighs, hips, buttocks, or legs. It is more common in people with type 2 diabetes mellitus and in older men. It is characterized by debilitating pain, weakness, and atrophy, usually in the thigh muscles.  What are the causes? The cause of peripheral, autonomic, and focal neuropathies is diabetes mellitus that is uncontrolled and high glucose levels. The cause of radiculoplexus neuropathy is unknown. However, it is thought to be caused by inflammation related to uncontrolled glucose levels. What are the signs or symptoms? Peripheral Neuropathy Peripheral  neuropathy develops slowly over time. When the nerves of the feet and legs no longer work there may be:  Burning, stabbing, or aching pain in the legs or feet.  Inability to feel pressure or pain in your feet. This can lead to: ? Thick calluses over pressure areas. ? Pressure sores. ? Ulcers.  Foot deformities.  Reduced ability to feel temperature changes.  Muscle weakness.  Autonomic Neuropathy The symptoms of autonomic neuropathy vary depending on which nerves are affected. Symptoms may include:  Problems with digestion, such as: ? Feeling sick to your stomach (nausea). ? Vomiting. ? Bloating. ? Constipation. ? Diarrhea. ? Abdominal pain.  Difficulty with urination. This occurs if you lose your ability to sense when your bladder is full. Problems include: ? Urine leakage (incontinence). ? Inability to empty your bladder completely (retention).  Rapid or irregular heartbeat (palpitations).  Blood pressure drops when you stand up (orthostatic hypotension). When you stand up you may feel: ? Dizzy. ? Weak. ? Faint.  In men, inability to attain and maintain an erection.  In women, vaginal dryness and problems with decreased sexual desire and arousal.  Problems with body temperature regulation.  Increased or decreased sweating.  Focal Neuropathy  Abnormal eye movements or abnormal alignment of both eyes.  Weakness in the wrist.  Foot drop. This results in an inability to lift the foot properly and abnormal walking or foot movement.  Paralysis on one side of your face (Bell palsy).  Chest or abdominal pain. Radiculoplexus Neuropathy  Sudden, severe pain in your hip, thigh, or buttocks.  Weakness and wasting of thigh muscles.  Difficulty rising from a seated position.  Abdominal swelling.  Unexplained weight loss (usually more than 10 lb [4.5 kg]). How is   this diagnosed? Peripheral Neuropathy Your senses may be tested. Sensory function testing can be  done with:  A light touch using a monofilament.  A vibration with tuning fork.  A sharp sensation with a pin prick.  Other tests that can help diagnose neuropathy are:  Nerve conduction velocity. This test checks the transmission of an electrical current through a nerve.  Electromyography. This shows how muscles respond to electrical signals transmitted by nearby nerves.  Quantitative sensory testing. This is used to assess how your nerves respond to vibrations and changes in temperature.  Autonomic Neuropathy Diagnosis is often based on reported symptoms. Tell your health care provider if you experience:  Dizziness.  Constipation.  Diarrhea.  Inappropriate urination or inability to urinate.  Inability to get or maintain an erection.  Tests that may be done include:  Electrocardiography or Holter monitor. These are tests that can help show problems with the heart rate or heart rhythm.  An X-ray exam may be done.  Focal Neuropathy Diagnosis is made based on your symptoms and what your health care provider finds during your exam. Other tests may be done. They may include:  Nerve conduction velocities. This checks the transmission of electrical current through a nerve.  Electromyography. This shows how muscles respond to electrical signals transmitted by nearby nerves.  Quantitative sensory testing. This test is used to assess how your nerves respond to vibration and changes in temperature.  Radiculoplexus Neuropathy  Often the first thing is to eliminate any other issue or problems that might be the cause, as there is no standard test for diagnosis.  X-ray exam of your spine and lumbar region.  Spinal tap to rule out cancer.  MRI to rule out other lesions. How is this treated? Once nerve damage occurs, it cannot be reversed. The goal of treatment is to keep the disease or nerve damage from getting worse and affecting more nerve fibers. Controlling your blood  glucose level is the key. Most people with radiculoplexus neuropathy see at least a partial improvement over time. You will need to keep your blood glucose and HbA1c levels in the target range determined by your health care provider. Things that help control blood glucose levels include:  Blood glucose monitoring.  Meal planning.  Physical activity.  Diabetes medicine.  Over time, maintaining lower blood glucose levels helps lessen symptoms. Sometimes, prescription pain medicine is needed. Follow these instructions at home:  Do not smoke.  Keep your blood glucose level in the range that you and your health care provider have determined acceptable for you.  Keep your blood pressure level in the range that you and your health care provider have determined acceptable for you.  Eat a well-balanced diet.  Be physically active every day. Include strength training and balance exercises.  Protect your feet. ? Check your feet every day for sores, cuts, blisters, or signs of infection. ? Wear padded socks and supportive shoes. Use orthotic inserts, if necessary. ? Regularly check the insides of your shoes for worn spots. Make sure there are no rocks or other items inside your shoes before you put them on. Contact a health care provider if:  You have burning, stabbing, or aching pain in the legs or feet.  You are unable to feel pressure or pain in your feet.  You develop problems with digestion such as: ? Nausea. ? Vomiting. ? Bloating. ? Constipation. ? Diarrhea. ? Abdominal pain.  You have difficulty with urination, such as: ? Incontinence. ? Retention.    You have palpitations.  You develop orthostatic hypotension. When you stand up you may feel: ? Dizzy. ? Weak. ? Faint.  You cannot attain and maintain an erection (in men).  You have vaginal dryness and problems with decreased sexual desire and arousal (in women).  You have severe pain in your thighs, legs, or  buttocks.  You have unexplained weight loss. This information is not intended to replace advice given to you by your health care provider. Make sure you discuss any questions you have with your health care provider. Document Released: 09/28/2001 Document Revised: 12/26/2015 Document Reviewed: 12/29/2012 Elsevier Interactive Patient Education  2017 [employer]  

## 2017-04-24 IMAGING — DX DG HIP (WITH OR WITHOUT PELVIS) 2-3V*R*
3 series · 3 of 3 positions shown · non-contrast
Comparison: None.

CLINICAL DATA: Acute onset of right lateral hip pain and swelling.
Initial encounter.

EXAM:
DG HIP (WITH OR WITHOUT PELVIS) 2-3V RIGHT

[pelvis ap]
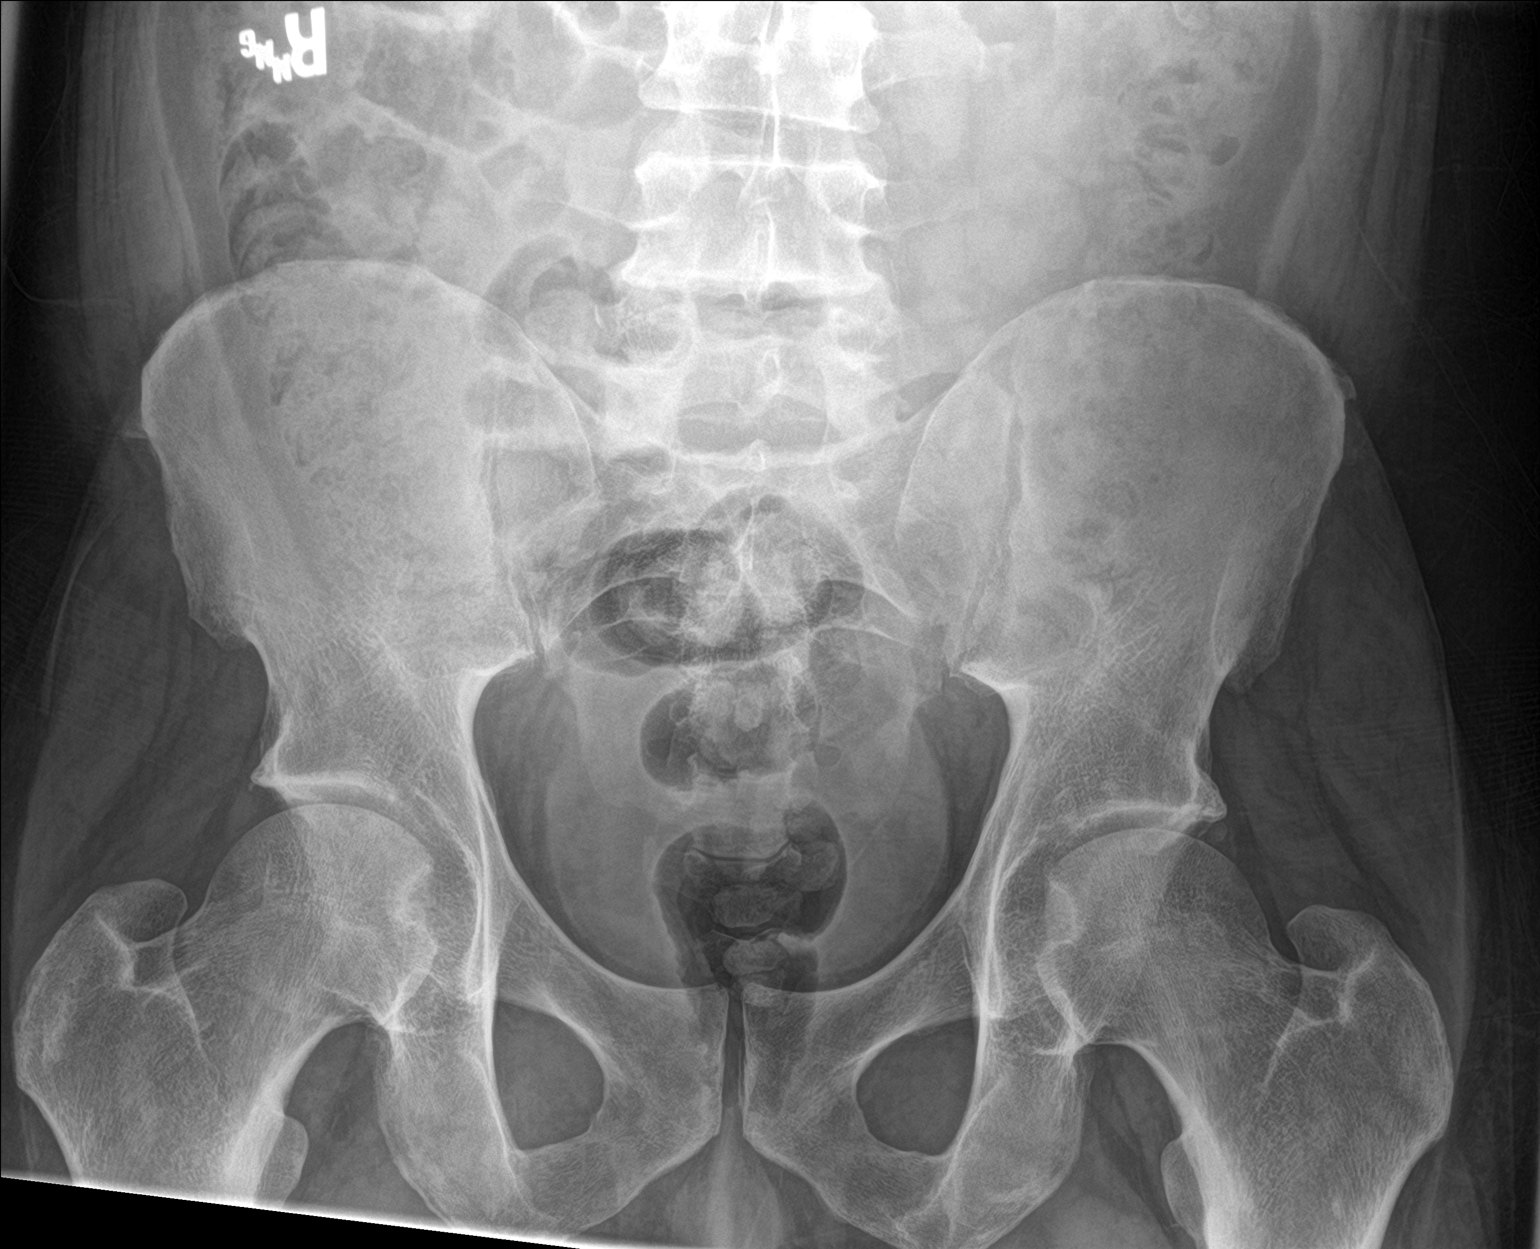

[hip ap]
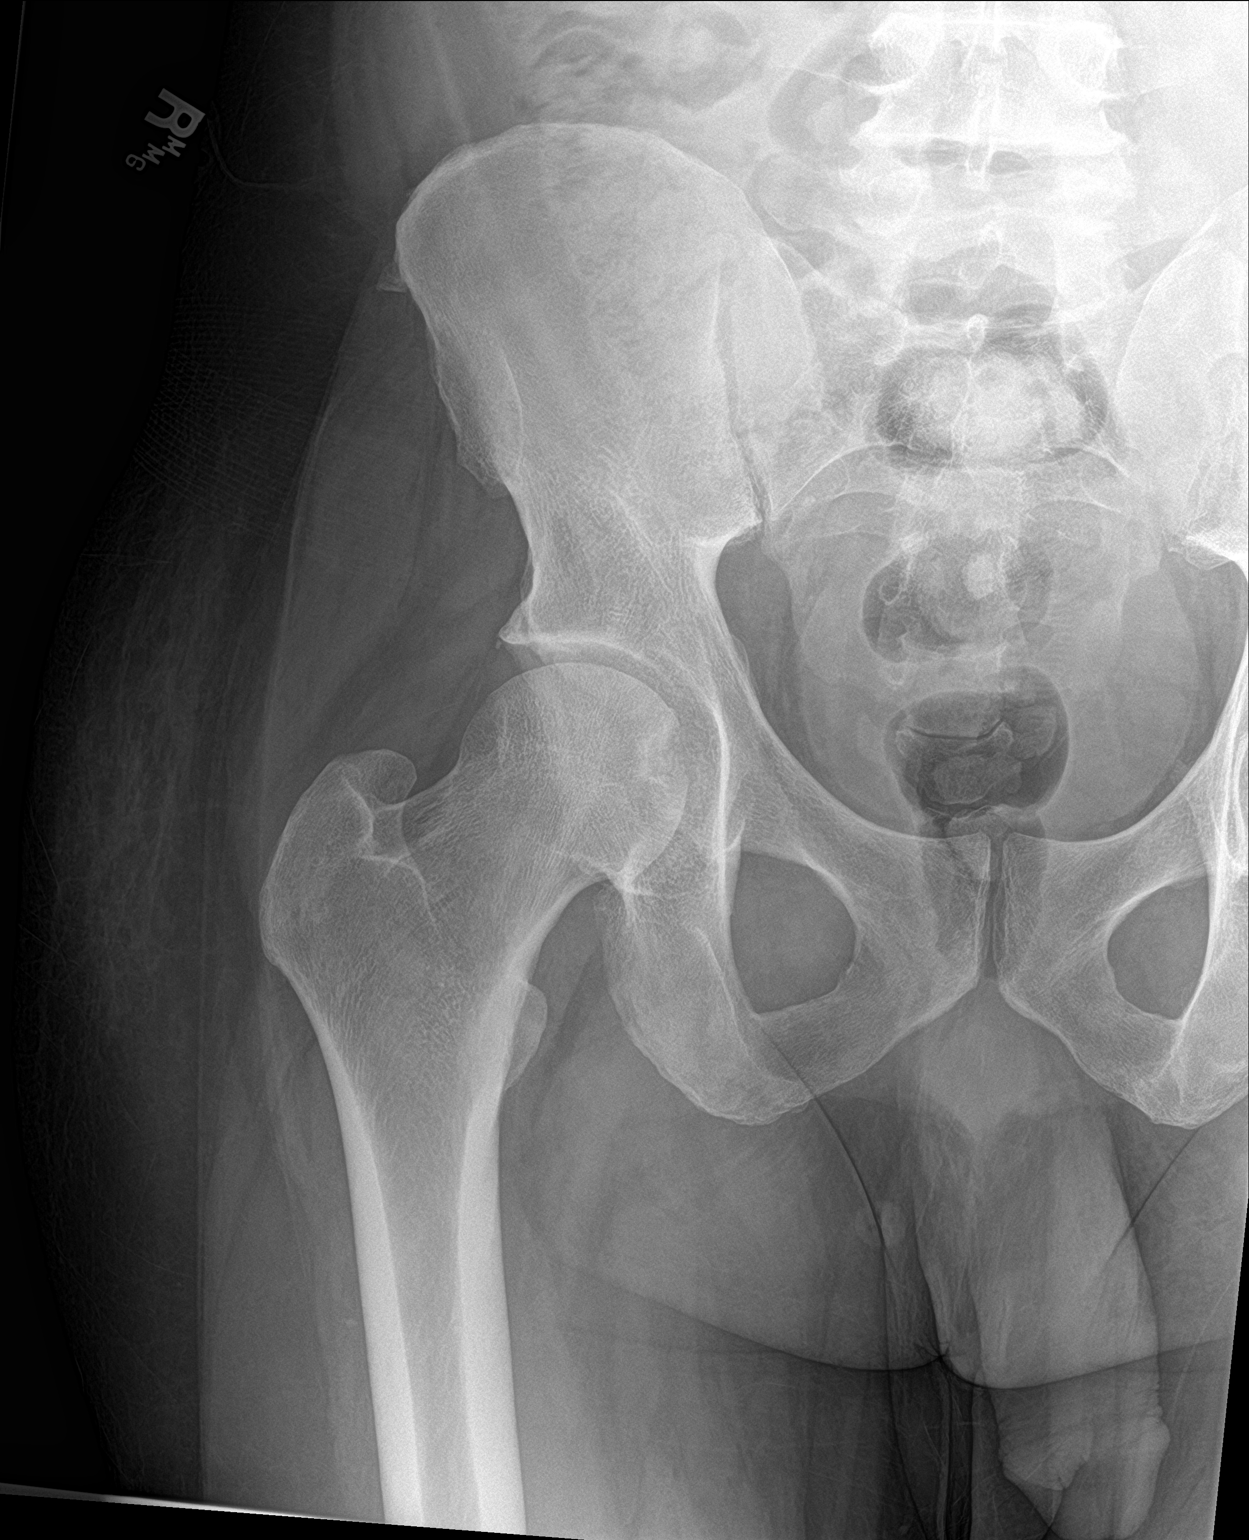

[hip lat]
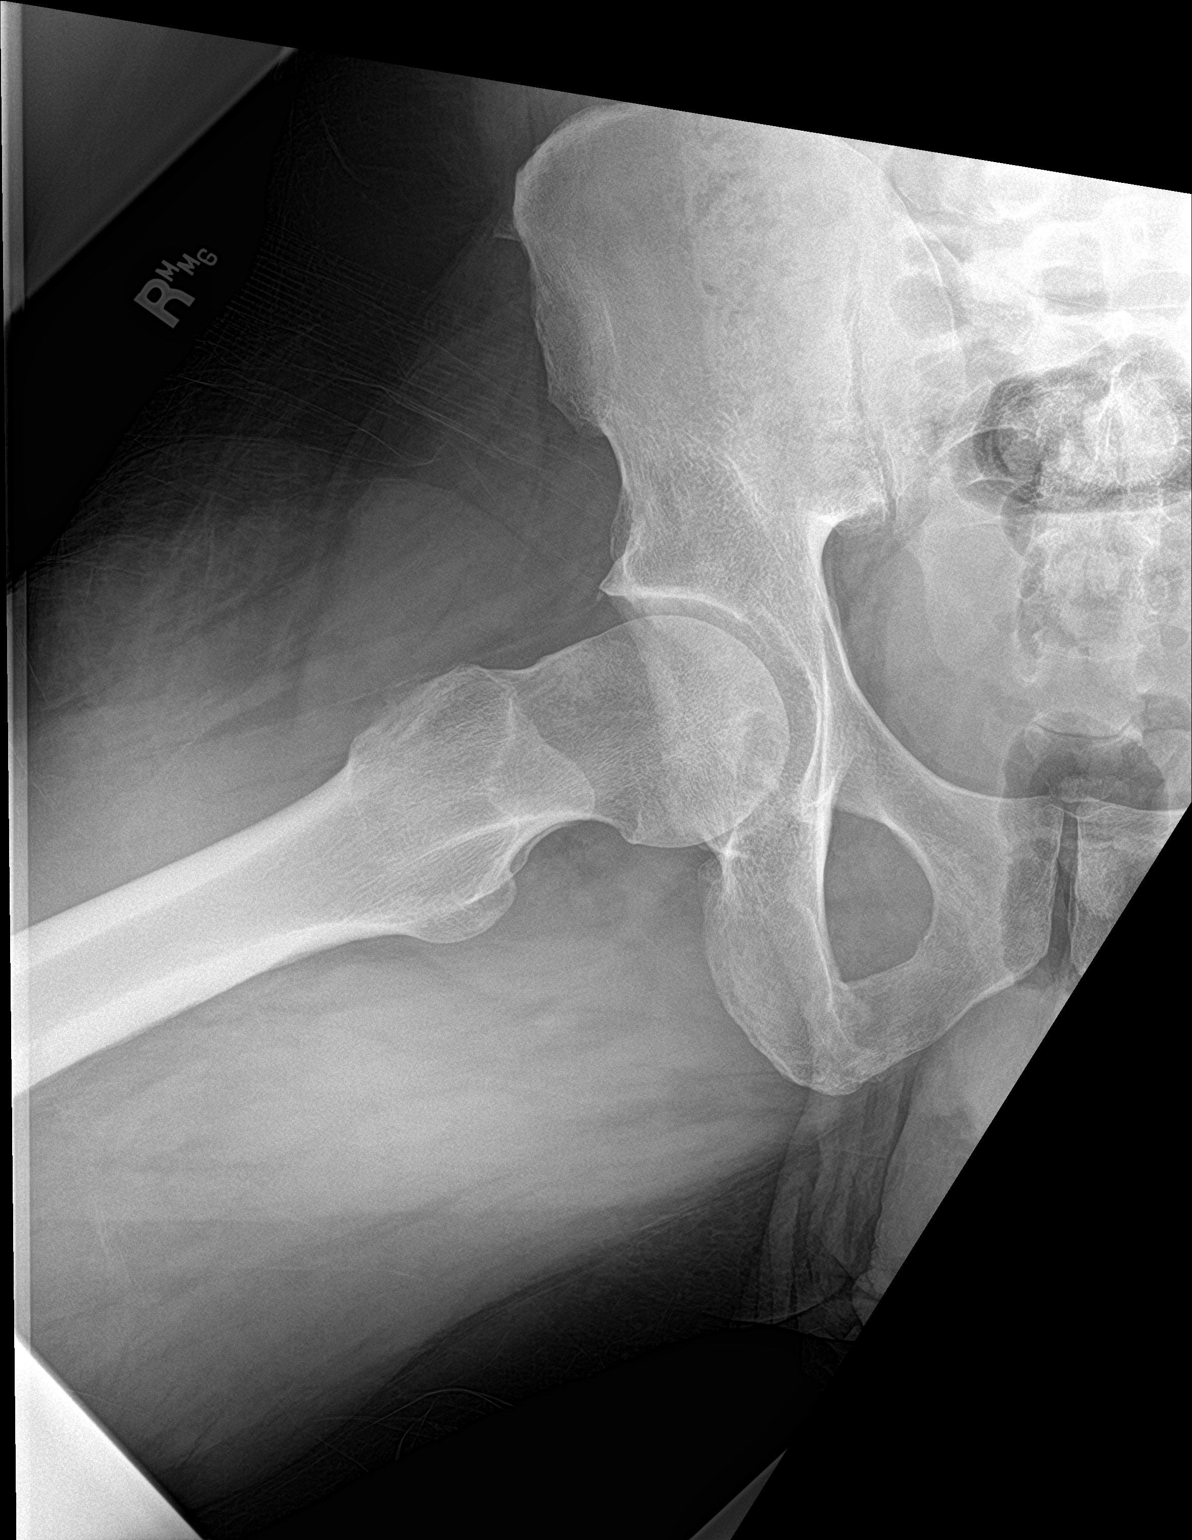

[3 of 3 positions shown; findings below may reference images not displayed]

FINDINGS: There is no evidence of fracture or dislocation. Both femoral heads
are seated normally within their respective acetabula. The proximal
right femur appears intact. No significant degenerative change is
appreciated. The sacroiliac joints are unremarkable in appearance.

The visualized bowel gas pattern is grossly unremarkable in
appearance.
IMPRESSION: No evidence of fracture or dislocation.

## 2017-05-11 IMAGING — CR DG CHEST 2V
2 series · 2 of 2 positions shown · non-contrast
Comparison: 11/09/2016

CLINICAL DATA: Generalized body aches.

EXAM:
CHEST  2 VIEW

[chest pa]
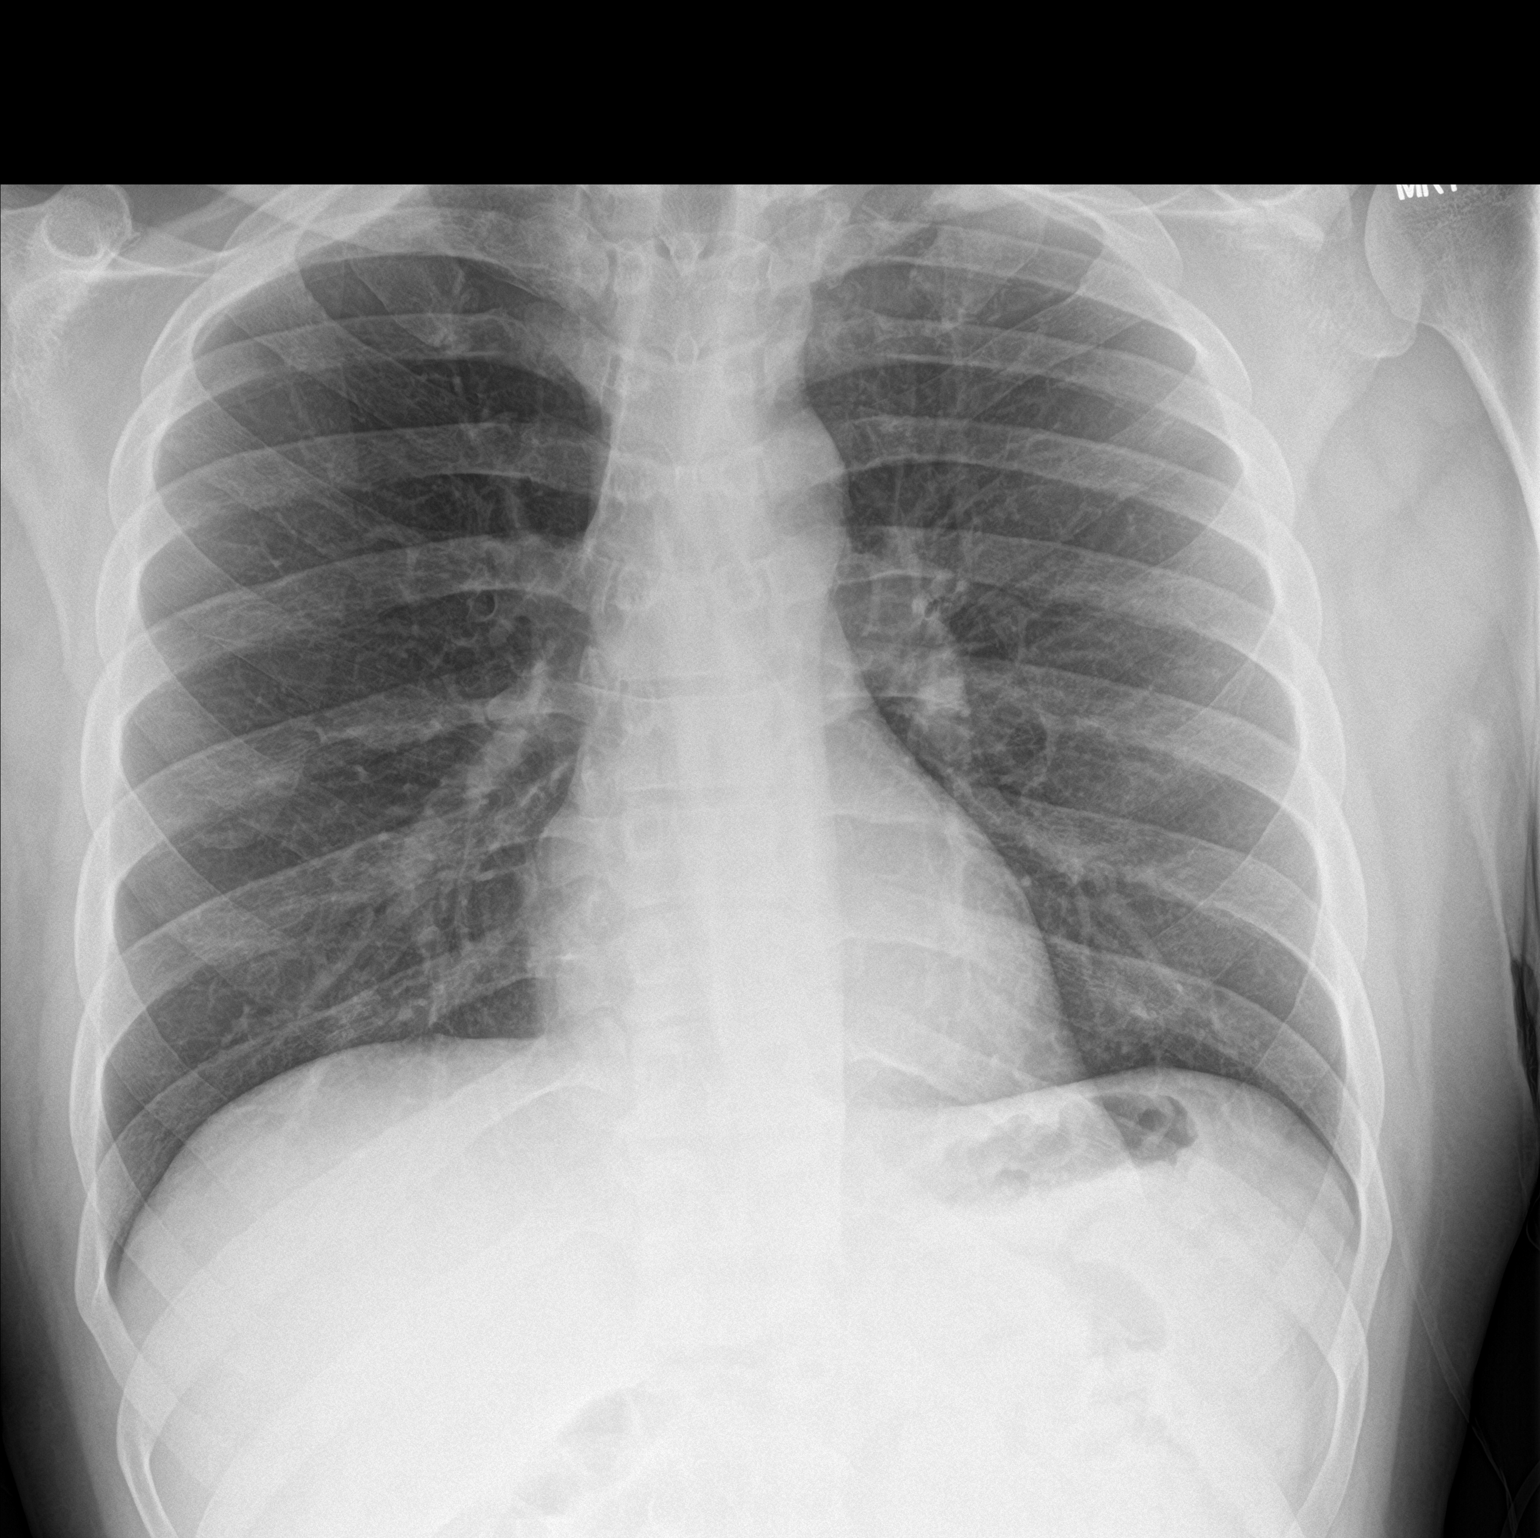

[chest lat]
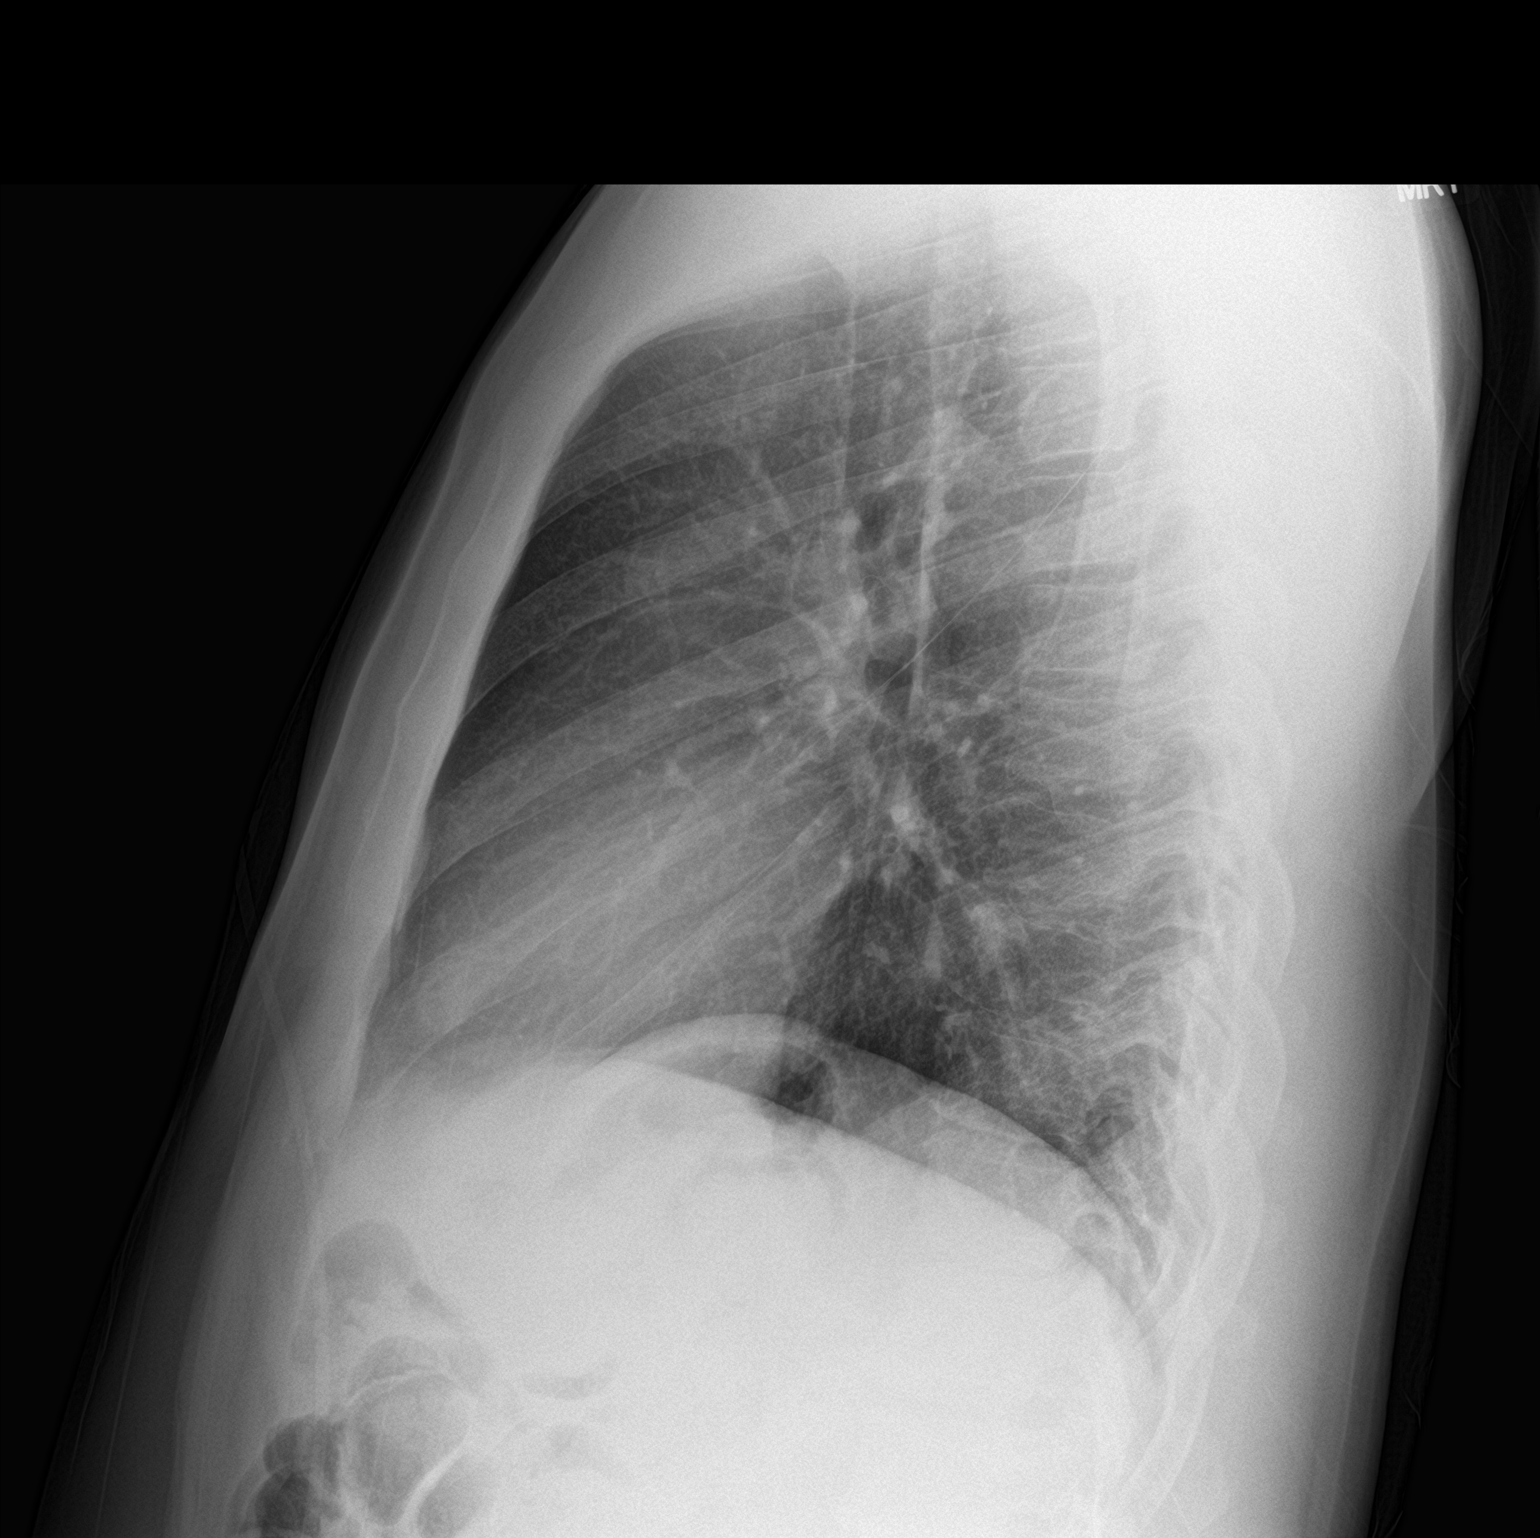

[2 of 2 positions shown; findings below may reference images not displayed]

FINDINGS: Cardiomediastinal silhouette is normal. Mediastinal contours appear
intact.

There is no evidence of focal airspace consolidation, pleural
effusion or pneumothorax.

Osseous structures are without acute abnormality. Soft tissues are
grossly normal.
IMPRESSION: No active cardiopulmonary disease.

## 2017-06-22 ENCOUNTER — Ambulatory Visit: Payer: Self-pay | Admitting: Family Medicine

## 2017-07-16 ENCOUNTER — Ambulatory Visit: Payer: Medicaid Other | Attending: Family Medicine | Admitting: Family Medicine

## 2017-07-16 ENCOUNTER — Encounter: Payer: Self-pay | Admitting: Family Medicine

## 2017-07-16 VITALS — BP 123/82 | HR 113 | Temp 98.5°F | Ht 78.0 in | Wt 230.0 lb

## 2017-07-16 DIAGNOSIS — M25562 Pain in left knee: Secondary | ICD-10-CM | POA: Insufficient documentation

## 2017-07-16 DIAGNOSIS — I10 Essential (primary) hypertension: Secondary | ICD-10-CM | POA: Diagnosis not present

## 2017-07-16 DIAGNOSIS — I129 Hypertensive chronic kidney disease with stage 1 through stage 4 chronic kidney disease, or unspecified chronic kidney disease: Secondary | ICD-10-CM | POA: Diagnosis not present

## 2017-07-16 DIAGNOSIS — Z89422 Acquired absence of other left toe(s): Secondary | ICD-10-CM | POA: Insufficient documentation

## 2017-07-16 DIAGNOSIS — E119 Type 2 diabetes mellitus without complications: Secondary | ICD-10-CM | POA: Diagnosis present

## 2017-07-16 DIAGNOSIS — Z79899 Other long term (current) drug therapy: Secondary | ICD-10-CM | POA: Diagnosis not present

## 2017-07-16 DIAGNOSIS — N189 Chronic kidney disease, unspecified: Secondary | ICD-10-CM | POA: Insufficient documentation

## 2017-07-16 DIAGNOSIS — E7800 Pure hypercholesterolemia, unspecified: Secondary | ICD-10-CM | POA: Insufficient documentation

## 2017-07-16 DIAGNOSIS — M1990 Unspecified osteoarthritis, unspecified site: Secondary | ICD-10-CM | POA: Insufficient documentation

## 2017-07-16 DIAGNOSIS — M25561 Pain in right knee: Secondary | ICD-10-CM | POA: Insufficient documentation

## 2017-07-16 DIAGNOSIS — F419 Anxiety disorder, unspecified: Secondary | ICD-10-CM | POA: Diagnosis not present

## 2017-07-16 DIAGNOSIS — F329 Major depressive disorder, single episode, unspecified: Secondary | ICD-10-CM | POA: Insufficient documentation

## 2017-07-16 DIAGNOSIS — Z9114 Patient's other noncompliance with medication regimen: Secondary | ICD-10-CM | POA: Diagnosis not present

## 2017-07-16 DIAGNOSIS — Z881 Allergy status to other antibiotic agents status: Secondary | ICD-10-CM | POA: Insufficient documentation

## 2017-07-16 DIAGNOSIS — E1122 Type 2 diabetes mellitus with diabetic chronic kidney disease: Secondary | ICD-10-CM | POA: Insufficient documentation

## 2017-07-16 DIAGNOSIS — G8929 Other chronic pain: Secondary | ICD-10-CM

## 2017-07-16 DIAGNOSIS — Z794 Long term (current) use of insulin: Secondary | ICD-10-CM | POA: Diagnosis not present

## 2017-07-16 DIAGNOSIS — Z89412 Acquired absence of left great toe: Secondary | ICD-10-CM | POA: Diagnosis not present

## 2017-07-16 DIAGNOSIS — E1140 Type 2 diabetes mellitus with diabetic neuropathy, unspecified: Secondary | ICD-10-CM | POA: Insufficient documentation

## 2017-07-16 LAB — GLUCOSE, POCT (MANUAL RESULT ENTRY)
POC GLUCOSE: 589 mg/dL — AB (ref 70–99)
POC GLUCOSE: 487 mg/dL — AB (ref 70–99)

## 2017-07-16 LAB — POCT GLYCOSYLATED HEMOGLOBIN (HGB A1C): HEMOGLOBIN A1C: 13.8

## 2017-07-16 MED ORDER — ACCU-CHEK AVIVA PLUS W/DEVICE KIT: PACK | 0 refills | Status: AC

## 2017-07-16 MED ORDER — ACCU-CHEK SOFTCLIX LANCET DEV MISC: 0 refills | Status: DC

## 2017-07-16 MED ORDER — INSULIN GLARGINE 100 UNIT/ML SOLOSTAR PEN: 30.0000 [IU] | PEN_INJECTOR | Freq: Two times a day (BID) | SUBCUTANEOUS | 6 refills | Status: AC

## 2017-07-16 MED ORDER — LISINOPRIL 5 MG PO TABS: 5.0000 mg | ORAL_TABLET | Freq: Every day | ORAL | 5 refills | Status: AC

## 2017-07-16 MED ORDER — INSULIN ASPART 100 UNIT/ML FLEXPEN: 10.0000 [IU] | PEN_INJECTOR | Freq: Three times a day (TID) | SUBCUTANEOUS | 6 refills | Status: AC

## 2017-07-16 MED ORDER — ATORVASTATIN CALCIUM 40 MG PO TABS: 40.0000 mg | ORAL_TABLET | Freq: Every day | ORAL | 5 refills | Status: AC

## 2017-07-16 MED ORDER — NAPROXEN 500 MG PO TABS: 500.0000 mg | ORAL_TABLET | Freq: Two times a day (BID) | ORAL | 1 refills | Status: DC

## 2017-07-16 MED ORDER — GLUCOSE BLOOD VI STRP: ORAL_STRIP | 12 refills | Status: AC

## 2017-07-16 MED ORDER — INSULIN ASPART 100 UNIT/ML ~~LOC~~ SOLN
25.0000 [IU] | Freq: Once | SUBCUTANEOUS | Status: AC
  Administered 2017-07-16: 25 [IU] via SUBCUTANEOUS

## 2017-07-16 MED ORDER — DULOXETINE HCL 60 MG PO CPEP: 60.0000 mg | ORAL_CAPSULE | Freq: Every day | ORAL | 5 refills | Status: AC

## 2017-07-16 MED ORDER — GABAPENTIN 300 MG PO CAPS: 300.0000 mg | ORAL_CAPSULE | Freq: Two times a day (BID) | ORAL | 3 refills | Status: AC

## 2017-07-16 MED FILL — LANTUS SOLOSTAR 100 UNITS/M: 100 | 25 days supply | Qty: 15 | Fill #0

## 2017-07-16 MED FILL — LISINOPRIL 5 MG TAB: 5 | 30 days supply | Qty: 30 | Fill #0

## 2017-07-16 MED FILL — ACCU-CHEK AVIVA PLUS TEST S: 30 days supply | Qty: 100 | Fill #0

## 2017-07-16 MED FILL — ACCU-CHEK AVIVA PLUS W/DEVI: W/DEVICE | 30 days supply | Qty: 1 | Fill #0

## 2017-07-16 MED FILL — NAPROXEN 500 MG TABLET: 500 | 30 days supply | Qty: 60 | Fill #0

## 2017-07-16 MED FILL — DULoxetine HCL 60 MG CPEP: 60 | 30 days supply | Qty: 30 | Fill #0

## 2017-07-16 MED FILL — ATORVASTATIN 40 MG TABLET: 40 | 30 days supply | Qty: 30 | Fill #0

## 2017-07-16 MED FILL — GABAPENTIN 300 MG CAPSULE: 300 | 30 days supply | Qty: 60 | Fill #0

## 2017-07-16 MED FILL — NOVOLOG FLEXPEN SYRINGE: 100 | 30 days supply | Qty: 9 | Fill #0

## 2017-07-16 MED FILL — ACCU-CHEK SOFTCLIX LANCETS: 30 days supply | Qty: 100 | Fill #0

## 2017-07-16 NOTE — Progress Notes (Signed)
Pt needs refills on all medications.  °

## 2017-07-16 NOTE — Patient Instructions (Signed)
Diabetes Mellitus and Food It is important for you to manage your blood sugar (glucose) level. Your blood glucose level can be greatly affected by what you eat. Eating healthier foods in the appropriate amounts throughout the day at about the same time each day will help you control your blood glucose level. It can also help slow or prevent worsening of your diabetes mellitus. Healthy eating may even help you improve the level of your blood pressure and reach or maintain a healthy weight. General recommendations for healthful eating and cooking habits include:  Eating meals and snacks regularly. Avoid going long periods of time without eating to lose weight.  Eating a diet that consists mainly of plant-based foods, such as fruits, vegetables, nuts, legumes, and whole grains.  Using low-heat cooking methods, such as baking, instead of high-heat cooking methods, such as deep frying.  Work with your dietitian to make sure you understand how to use the Nutrition Facts information on food labels. How can food affect me? Carbohydrates Carbohydrates affect your blood glucose level more than any other type of food. Your dietitian will help you determine how many carbohydrates to eat at each meal and teach you how to count carbohydrates. Counting carbohydrates is important to keep your blood glucose at a healthy level, especially if you are using insulin or taking certain medicines for diabetes mellitus. Alcohol Alcohol can cause sudden decreases in blood glucose (hypoglycemia), especially if you use insulin or take certain medicines for diabetes mellitus. Hypoglycemia can be a life-threatening condition. Symptoms of hypoglycemia (sleepiness, dizziness, and disorientation) are similar to symptoms of having too much alcohol. If your health care provider has given you approval to drink alcohol, do so in moderation and use the following guidelines:  Women should not have more than one drink per day, and men  should not have more than two drinks per day. One drink is equal to: ? 12 oz of beer. ? 5 oz of wine. ? 1 oz of hard liquor.  Do not drink on an empty stomach.  Keep yourself hydrated. Have water, diet soda, or unsweetened iced tea.  Regular soda, juice, and other mixers might contain a lot of carbohydrates and should be counted.  What foods are not recommended? As you make food choices, it is important to remember that all foods are not the same. Some foods have fewer nutrients per serving than other foods, even though they might have the same number of calories or carbohydrates. It is difficult to get your body what it needs when you eat foods with fewer nutrients. Examples of foods that you should avoid that are high in calories and carbohydrates but low in nutrients include:  Trans fats (most processed foods list trans fats on the Nutrition Facts label).  Regular soda.  Juice.  Candy.  Sweets, such as cake, pie, doughnuts, and cookies.  Fried foods.  What foods can I eat? Eat nutrient-rich foods, which will nourish your body and keep you healthy. The food you should eat also will depend on several factors, including:  The calories you need.  The medicines you take.  Your weight.  Your blood glucose level.  Your blood pressure level.  Your cholesterol level.  You should eat a variety of foods, including:  Protein. ? Lean cuts of meat. ? Proteins low in saturated fats, such as fish, egg whites, and beans. Avoid processed meats.  Fruits and vegetables. ? Fruits and vegetables that may help control blood glucose levels, such as apples,   mangoes, and yams.  Dairy products. ? Choose fat-free or low-fat dairy products, such as milk, yogurt, and cheese.  Grains, bread, pasta, and rice. ? Choose whole grain products, such as multigrain bread, whole oats, and brown rice. These foods may help control blood pressure.  Fats. ? Foods containing healthful fats, such as  nuts, avocado, olive oil, canola oil, and fish.  Does everyone with diabetes mellitus have the same meal plan? Because every person with diabetes mellitus is different, there is not one meal plan that works for everyone. It is very important that you meet with a dietitian who will help you create a meal plan that is just right for you. This information is not intended to replace advice given to you by your health care provider. Make sure you discuss any questions you have with your health care provider. Document Released: 04/16/2005 Document Revised: 12/26/2015 Document Reviewed: 06/16/2013 Elsevier Interactive Patient Education  2017 [employer]  

## 2017-07-16 NOTE — Progress Notes (Signed)
Subjective:  Patient ID: Ryan Shaw, male    DOB: [birth year]  Age: 51 y.o. 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  He endorses non compliance with his medications hence trending up of his A1c from 8.7 to 13.8. He 'wanted to give up' and stopped all his medications but had a conversation recently with his daughter and has made a new resolve to be more compliant. His neuropathy is uncontrolled as he has been out of Gabapentin. Previously prescribed Lyrica which he has been unable to obtain due to lack of an ID.  He denies suicidal ideations or intents and will need a refill of all his medications.  He sustained a puncture wound after he stepped on an object a few months ago and states he has been cleaning it with peroxide. Denies drainage from wound, fever or pain.  Past Medical History:  Diagnosis Date  . Arthritis   . CKD (chronic kidney disease)   . Diabetes mellitus without complication ([place])   . Edema   . Hypercholesteremia   . Hypertension   . Neuropathy     Past Surgical History:  Procedure Laterality Date  . AMPUTATION Right 02/17/2015   Procedure: SECOND RAY AMPUTATION;  Surgeon: [name], MD;  Location: [facility];  Service: Orthopedics;  Laterality: Right;  . AMPUTATION Left 10/04/2016   Procedure: GREAT TOE AMPUTATION GASTROCNEMIA RESECTION;  Surgeon: Newt Minion, MD;  Location: [facility];  Service: Orthopedics;  Laterality: Left;  . AMPUTATION Left 12/16/2016   Procedure: LEFT FOOT 2ND TOE AMPUTATION;  Surgeon: Newt Minion, MD;  Location: [facility];  Service: Orthopedics;  Laterality: Left;  . HIP SURGERY Right   . INCISION AND DRAINAGE HIP Right 11/12/2016   Procedure: IRRIGATION AND  DEBRIDEMENT RIGHT HIP;  Surgeon: Newt Minion, MD;  Location: [facility];  Service: Orthopedics;  Laterality: Right;    Allergies  Allergen Reactions  . Other Nausea And Vomiting    NeuRemedy - Dietary Supplement  . Vancomycin Other (See Comments)    Given after surgery in January 2018 at [facility] - shut kidneys down per pt     Outpatient Medications Prior to Visit  Medication Sig Dispense Refill  . cyclobenzaprine (FLEXERIL) 10 MG tablet Take 1 tablet (10 mg total) by mouth 2 (two) times daily as needed for muscle spasms. 50 tablet 2  . Insulin Syringes, Disposable, U-100 1 ML MISC 1 each by Does not apply route 3 (three) times daily. 100 each 12  . atorvastatin (LIPITOR) 40 MG tablet Take 1 tablet (40 mg total) by mouth at bedtime. 30 tablet 5  . DULoxetine (CYMBALTA) 60 MG capsule Take 1 capsule (60 mg total) by mouth daily. 30 capsule 3  . gabapentin (NEURONTIN) 300 MG capsule Take 1 capsule (300 mg total) by mouth 2 (two) times daily. Stop once Lyrica arrives 60 capsule 0  . insulin aspart (NOVOLOG) 100 UNIT/ML FlexPen Inject 10 Units into the skin 3 (three) times daily with meals. 15 mL 11  . Insulin Glargine (LANTUS) 100 UNIT/ML Solostar Pen Inject 30 Units into the skin 2 (two) times daily. 45 mL 11  . lisinopril (PRINIVIL,ZESTRIL) 5 MG tablet Take 1 tablet (5 mg total) by mouth  at bedtime. 30 tablet 3  . naproxen (NAPROSYN) 500 MG tablet Take 1 tablet (500 mg total) by mouth 2 (two) times daily with a meal. 60 tablet 1  . polyethylene glycol powder (GLYCOLAX/MIRALAX) powder Take 17 g by mouth 2 (two) times daily as needed. (Patient not taking: Reported on 04/22/2017) 3350 g 1  . pregabalin (LYRICA) 100 MG capsule Take 1 capsule (100 mg total) by mouth 2 (two) times daily. (Patient not taking: Reported on 04/22/2017) 180 capsule 3  . sildenafil (VIAGRA) 25 MG tablet Take 1 tablet (25 mg total) by mouth daily as needed for erectile dysfunction. (Patient not  taking: Reported on 04/22/2017) 30 tablet 3  . tobramycin (TOBREX) 0.3 % ophthalmic solution Place 2 drops into the right eye every 4 (four) hours. (Patient not taking: Reported on 04/22/2017) 5 mL 0  . insulin detemir (LEVEMIR) 100 UNIT/ML injection Inject 0.3 mLs (30 Units total) into the skin 2 (two) times daily. (Patient not taking: Reported on 07/16/2017) 30 mL 5  . traMADol (ULTRAM) 50 MG tablet Take 2 tablets (100 mg total) by mouth every 6 (six) hours as needed for moderate pain. (Patient not taking: Reported on 04/22/2017) 20 tablet 0   No facility-administered medications prior to visit.     ROS Review of Systems  Constitutional: Negative for activity change and appetite change.  HENT: Negative for sinus pressure and sore throat.   Eyes: Negative for visual disturbance.  Respiratory: Negative for cough, chest tightness and shortness of breath.   Cardiovascular: Negative for chest pain and leg swelling.  Gastrointestinal: Negative for abdominal distention, abdominal pain, constipation and diarrhea.  Endocrine: Negative.   Genitourinary: Negative for dysuria.  Musculoskeletal: Negative for joint swelling and myalgias.  Skin: Positive for wound. Negative for rash.  Allergic/Immunologic: Negative.   Neurological: Positive for numbness. Negative for weakness and light-headedness.  Psychiatric/Behavioral: Negative for dysphoric mood and suicidal ideas.    Objective:  BP 123/82   Pulse (!) 113   Temp 98.5 F (36.9 C) (Oral)   Ht 6' 6"  (1.981 m)   Wt 230 lb (104.3 kg)   SpO2 100%   BMI 26.58 kg/m   BP/Weight 07/16/2017 6/59/9357 0/08/7791  Systolic BP 903 009 233  Diastolic BP 82 71 89  Wt. (Lbs) 230 241.4 -  BMI 26.58 27.9 -      Physical Exam  Constitutional: He is oriented to person, place, and time. He appears well-developed and well-nourished.  Cardiovascular: Normal rate, normal heart sounds and intact distal pulses.  No murmur heard. Pulmonary/Chest: Effort  normal and breath sounds normal. He has no wheezes. He has no rales. He exhibits no tenderness.  Abdominal: Soft. Bowel sounds are normal. He exhibits no distension and no mass. There is no tenderness.  Musculoskeletal: Normal range of motion.  Neurological: He is alert and oriented to person, place, and time.  Skin:  Puncture wound on sole of right foot, not tender with no drainage which is healing well  Psychiatric: He has a normal mood and affect.     Lab Results  Component Value Date   HGBA1C 13.8 07/16/2017    Assessment & Plan:   1. Type 2 diabetes mellitus with diabetic neuropathy, with long-term current use of insulin (HCC) Uncontrolled wit A1c of 13.8 due to non compliance Refilled all medications - he has a new resolve to be more adherent Diabetic diet. - POCT glucose (manual entry) - POCT glycosylated hemoglobin (Hb A1C) - insulin aspart (NOVOLOG) 100 UNIT/ML  FlexPen; Inject 10 Units into the skin 3 (three) times daily with meals.  Dispense: 15 mL; Refill: 6 - insulin aspart (novoLOG) injection 25 Units - POCT glucose (manual entry)  2. Essential hypertension Controlled Low sodium diet - lisinopril (PRINIVIL,ZESTRIL) 5 MG tablet; Take 1 tablet (5 mg total) by mouth at bedtime.  Dispense: 30 tablet; Refill: 5  3. Bilateral chronic knee pain Secondary to Osteoarthritis - naproxen (NAPROSYN) 500 MG tablet; Take 1 tablet (500 mg total) by mouth 2 (two) times daily with a meal.  Dispense: 60 tablet; Refill: 1  4. Noncompliance with medication regimen Discussed consequences of non compliance and he promises to do better.  5. Anxiety and Depression Stable Continue Cymbalta  Meds ordered this encounter  Medications  . atorvastatin (LIPITOR) 40 MG tablet    Sig: Take 1 tablet (40 mg total) by mouth at bedtime.    Dispense:  30 tablet    Refill:  5  . DULoxetine (CYMBALTA) 60 MG capsule    Sig: Take 1 capsule (60 mg total) by mouth daily.    Dispense:  30 capsule      Refill:  5  . gabapentin (NEURONTIN) 300 MG capsule    Sig: Take 1 capsule (300 mg total) by mouth 2 (two) times daily. Stop once Lyrica arrives    Dispense:  60 capsule    Refill:  3    Pending availability of Lyrica  . insulin aspart (NOVOLOG) 100 UNIT/ML FlexPen    Sig: Inject 10 Units into the skin 3 (three) times daily with meals.    Dispense:  15 mL    Refill:  6  . Insulin Glargine (LANTUS) 100 UNIT/ML Solostar Pen    Sig: Inject 30 Units into the skin 2 (two) times daily.    Dispense:  45 mL    Refill:  6  . lisinopril (PRINIVIL,ZESTRIL) 5 MG tablet    Sig: Take 1 tablet (5 mg total) by mouth at bedtime.    Dispense:  30 tablet    Refill:  5  . naproxen (NAPROSYN) 500 MG tablet    Sig: Take 1 tablet (500 mg total) by mouth 2 (two) times daily with a meal.    Dispense:  60 tablet    Refill:  1  . insulin aspart (novoLOG) injection 25 Units  . Blood Glucose Monitoring Suppl (ACCU-CHEK AVIVA PLUS) w/Device KIT    Sig: Use as directed    Dispense:  1 kit    Refill:  0  . Lancet Devices (ACCU-CHEK SOFTCLIX) lancets    Sig: Use as instructed    Dispense:  1 each    Refill:  0  . glucose blood (ACCU-CHEK AVIVA PLUS) test strip    Sig: Use as instructed    Dispense:  100 each    Refill:  12    Follow-up: Return in about 3 months (around 10/14/2017) for Follow-up on diabetes mellitus.   Arnoldo Morale MD

## 2017-07-19 ENCOUNTER — Encounter: Payer: Self-pay | Admitting: Family Medicine

## 2017-09-21 MED FILL — LANTUS SOLOSTAR 100 UNITS/M: 100 | 25 days supply | Qty: 15 | Fill #1

## 2017-09-21 MED FILL — DULoxetine HCL 60 MG CPEP: 60 | 30 days supply | Qty: 30 | Fill #1

## 2017-09-21 MED FILL — ATORVASTATIN 40 MG TABLET: 40 | 30 days supply | Qty: 30 | Fill #1

## 2017-09-21 MED FILL — NOVOLOG FLEXPEN SYRINGE: 100 | 30 days supply | Qty: 9 | Fill #1

## 2017-09-21 MED FILL — CYCLOBENZAPRINE 10 MG TAB: 10 | 25 days supply | Qty: 50 | Fill #1

## 2017-09-21 MED FILL — LISINOPRIL 5 MG TABLET: 5 | 30 days supply | Qty: 30 | Fill #1

## 2017-09-21 MED FILL — GABAPENTIN 300 MG CAPSULE: 300 | 30 days supply | Qty: 60 | Fill #1

## 2017-09-29 ENCOUNTER — Other Ambulatory Visit: Payer: Self-pay

## 2017-09-29 MED ORDER — PEN NEEDLES 31G X 5 MM MISC: 10.0000 [IU] | Freq: Three times a day (TID) | 11 refills | Status: AC

## 2017-10-15 ENCOUNTER — Ambulatory Visit: Payer: Medicaid Other | Admitting: Family Medicine

## 2017-11-11 MED FILL — TRUEPLUS PEN NDL 31GX3/16: 31G X 5 MM | 30 days supply | Qty: 100 | Fill #0

## 2017-11-11 MED FILL — GABAPENTIN 300 MG CAPSULE: 300 | 30 days supply | Qty: 60 | Fill #2

## 2017-11-11 MED FILL — TRUEPLUS PEN NDL 31GX3/16": 31G X 5 MM | 30 days supply | Qty: 100 | Fill #0

## 2017-11-11 MED FILL — LISINOPRIL 5 MG TAB: 5 | 30 days supply | Qty: 30 | Fill #2

## 2017-11-11 MED FILL — ATORVASTATIN CALCIUM 40 MG: 40 | 30 days supply | Qty: 30 | Fill #2

## 2017-11-11 MED FILL — DULoxetine HCL 60 MG CPEP: 60 | 30 days supply | Qty: 30 | Fill #2

## 2017-11-11 MED FILL — LANTUS SOLOSTAR 100 UNITS/M: 100 | 25 days supply | Qty: 15 | Fill #2

## 2017-11-11 MED FILL — CYCLOBENZAPRINE 10 MG TAB: 10 | 25 days supply | Qty: 50 | Fill #2

## 2017-11-17 ENCOUNTER — Other Ambulatory Visit: Payer: Self-pay | Admitting: Family Medicine

## 2017-11-27 ENCOUNTER — Encounter (HOSPITAL_COMMUNITY): Payer: Self-pay | Admitting: Emergency Medicine

## 2017-11-27 ENCOUNTER — Emergency Department (HOSPITAL_COMMUNITY): Payer: Medicaid Other

## 2017-11-27 ENCOUNTER — Emergency Department (HOSPITAL_COMMUNITY)
Admission: EM | Admit: 2017-11-27 | Discharge: 2017-11-27 | Disposition: A | Discharge status: Home / Self Care | Payer: Medicaid Other | Attending: Emergency Medicine | Admitting: Emergency Medicine

## 2017-11-27 DIAGNOSIS — Z89412 Acquired absence of left great toe: Secondary | ICD-10-CM | POA: Insufficient documentation

## 2017-11-27 DIAGNOSIS — Y658 Other specified misadventures during surgical and medical care: Secondary | ICD-10-CM | POA: Insufficient documentation

## 2017-11-27 DIAGNOSIS — N189 Chronic kidney disease, unspecified: Secondary | ICD-10-CM | POA: Insufficient documentation

## 2017-11-27 DIAGNOSIS — Z794 Long term (current) use of insulin: Secondary | ICD-10-CM | POA: Insufficient documentation

## 2017-11-27 DIAGNOSIS — F17210 Nicotine dependence, cigarettes, uncomplicated: Secondary | ICD-10-CM | POA: Diagnosis not present

## 2017-11-27 DIAGNOSIS — Z79899 Other long term (current) drug therapy: Secondary | ICD-10-CM | POA: Diagnosis not present

## 2017-11-27 DIAGNOSIS — I129 Hypertensive chronic kidney disease with stage 1 through stage 4 chronic kidney disease, or unspecified chronic kidney disease: Secondary | ICD-10-CM | POA: Diagnosis not present

## 2017-11-27 DIAGNOSIS — E119 Type 2 diabetes mellitus without complications: Secondary | ICD-10-CM | POA: Insufficient documentation

## 2017-11-27 DIAGNOSIS — T8130XA Disruption of wound, unspecified, initial encounter: Secondary | ICD-10-CM | POA: Diagnosis present

## 2017-11-27 LAB — CBC WITH DIFFERENTIAL/PLATELET
Basophils Absolute: 0 10*3/uL (ref 0.0–0.1)
Basophils Relative: 1 %
Eosinophils Absolute: 0.2 10*3/uL (ref 0.0–0.7)
Eosinophils Relative: 4 %
HEMATOCRIT: 42.5 % (ref 39.0–52.0)
HEMOGLOBIN: 14 g/dL (ref 13.0–17.0)
LYMPHS ABS: 1.7 10*3/uL (ref 0.7–4.0)
LYMPHS PCT: 36 %
MCH: 27.9 pg (ref 26.0–34.0)
MCHC: 32.9 g/dL (ref 30.0–36.0)
MCV: 84.7 fL (ref 78.0–100.0)
MONOS PCT: 7 %
Monocytes Absolute: 0.4 10*3/uL (ref 0.1–1.0)
NEUTROS ABS: 2.5 10*3/uL (ref 1.7–7.7)
NEUTROS PCT: 52 %
Platelets: 266 10*3/uL (ref 150–400)
RBC: 5.02 MIL/uL (ref 4.22–5.81)
RDW: 13.3 % (ref 11.5–15.5)
WBC: 4.8 10*3/uL (ref 4.0–10.5)

## 2017-11-27 LAB — COMPREHENSIVE METABOLIC PANEL
ALBUMIN: 3.3 g/dL — AB (ref 3.5–5.0)
ALT: 32 U/L (ref 17–63)
AST: 33 U/L (ref 15–41)
Alkaline Phosphatase: 106 U/L (ref 38–126)
Anion gap: 5 (ref 5–15)
BUN: 16 mg/dL (ref 6–20)
CHLORIDE: 105 mmol/L (ref 101–111)
CO2: 29 mmol/L (ref 22–32)
Calcium: 9.1 mg/dL (ref 8.9–10.3)
Creatinine, Ser: 1.19 mg/dL (ref 0.61–1.24)
GFR calc non Af Amer: >60 mL/min (ref >60)
GLUCOSE: 334 mg/dL — AB (ref 65–99)
Potassium: 4.5 mmol/L (ref 3.5–5.1)
SODIUM: 139 mmol/L (ref 135–145)
Total Bilirubin: 0.6 mg/dL (ref 0.3–1.2)
Total Protein: 6.2 g/dL — ABNORMAL LOW (ref 6.5–8.1)

## 2017-11-27 LAB — I-STAT CG4 LACTIC ACID, ED: LACTIC ACID, VENOUS: 1 mmol/L (ref 0.5–1.9)

## 2017-11-27 NOTE — Discharge Instructions (Addendum)
Get help right away if: You have more redness, swelling, or pain around your wound. You have more fluid coming from your wound. Your wound feels warm to the touch. You have pus or a bad smell coming from your wound. Your wound breaks open farther. You have redness streaking or spreading from your wound. You have excessive bleeding from your wound.

## 2017-11-27 NOTE — ED Triage Notes (Signed)
Pt states he is diabetic, got denied disability so he had to go back to work after his toe amputation to left foot 1 year ago. States several weeks ago he noticed his incision site to left great toe amputation had opened back up. Foot has good pulses. Afebrile

## 2017-11-27 NOTE — ED Provider Notes (Signed)
[facility] EMERGENCY DEPARTMENT Provider Note   CSN: [id number] Arrival date & time: 11/27/17  1231     History   Chief Complaint Chief Complaint  Patient presents with  . Wound Check    HPI Ryan Shaw is a 52 y.o. male.  History of poorly controlled insulin-dependent diabetes and that is post left first and second toe amputation who presents to the emergency department for open foot ulceration.  States that he has been trying to file for disability for the past 3 years but it did not come through and get back to work 1 week ago.  Patient states that he has not put a lot of weight on the foot over the past 2 years.  This week he was on his feet a lot more and he noticed that the incision site of his great toe opened up and it is draining fluid.  He said it has a foul odor.  He denies pain but has poor sensation in his feet secondary to neuropathy.  He denies fevers, chills, redness, streaking or  other signs of infection.  HPI  Past Medical History:  Diagnosis Date  . Arthritis   . CKD (chronic kidney disease)   . Diabetes mellitus without complication ([place])   . Edema   . Hypercholesteremia   . Hypertension   . Neuropathy     Patient Active Problem List   Diagnosis Date Noted  . Anxiety and depression 01/04/2017  . H/O amputation of lesser toe, left ([place]) 12/25/2016  . Hyperlipidemia 12/15/2016  . MRSA infection   . Abscess of bursa of right hip   . Cellulitis of left lower extremity   . Fever   . Right hip pain   . Acute right ankle pain   . Idiopathic chronic gout of left ankle without tophus   . Amputee, great toe, left ([place]) 10/08/2016  . Diabetic foot ulcer ([place]) 10/08/2016  . Osteomyelitis due to type 2 diabetes mellitus ([place])   . Acquired contracture of Achilles tendon, left   . Essential hypertension 10/02/2016  . Bilateral chronic knee pain 07/09/2015  . Type 2 diabetes mellitus with diabetic neuropathy, with long-term current use of  insulin ([place]) 11/01/2014  . Diabetic peripheral neuropathy ([place]) 10/25/2014    Past Surgical History:  Procedure Laterality Date  . AMPUTATION Right 02/17/2015   Procedure: SECOND RAY AMPUTATION;  Surgeon: [name], MD;  Location: [facility];  Service: Orthopedics;  Laterality: Right;  . AMPUTATION Left 10/04/2016   Procedure: GREAT TOE AMPUTATION GASTROCNEMIA RESECTION;  Surgeon: Newt Minion, MD;  Location: [facility];  Service: Orthopedics;  Laterality: Left;  . AMPUTATION Left 12/16/2016   Procedure: LEFT FOOT 2ND TOE AMPUTATION;  Surgeon: Newt Minion, MD;  Location: [facility];  Service: Orthopedics;  Laterality: Left;  . HIP SURGERY Right   . INCISION AND DRAINAGE HIP Right 11/12/2016   Procedure: IRRIGATION AND DEBRIDEMENT RIGHT HIP;  Surgeon: Newt Minion, MD;  Location: [facility];  Service: Orthopedics;  Laterality: Right;        Home Medications    Prior to Admission medications   Medication Sig Start Date End Date Taking? Authorizing Provider  ACCU-CHEK SOFTCLIX LANCETS lancets USE AS INSTRUCTED 11/18/17   Charlott Rakes, MD  atorvastatin (LIPITOR) 40 MG tablet Take 1 tablet (40 mg total) by mouth at bedtime. 07/16/17   Charlott Rakes, MD  Blood Glucose Monitoring Suppl (ACCU-CHEK AVIVA PLUS) w/Device KIT Use as directed 07/16/17  Charlott Rakes, MD  cyclobenzaprine (FLEXERIL) 10 MG tablet Take 1 tablet (10 mg total) by mouth 2 (two) times daily as needed for muscle spasms. 04/22/17   Charlott Rakes, MD  DULoxetine (CYMBALTA) 60 MG capsule Take 1 capsule (60 mg total) by mouth daily. 07/16/17   Charlott Rakes, MD  gabapentin (NEURONTIN) 300 MG capsule Take 1 capsule (300 mg total) by mouth 2 (two) times daily. Stop once Lyrica arrives 07/16/17   Charlott Rakes, MD  glucose blood (ACCU-CHEK AVIVA PLUS) test strip Use as instructed 07/16/17   Charlott Rakes, MD  insulin aspart (NOVOLOG) 100 UNIT/ML FlexPen Inject 10 Units into the skin 3 (three) times daily with meals.  07/16/17   Charlott Rakes, MD  Insulin Glargine (LANTUS) 100 UNIT/ML Solostar Pen Inject 30 Units into the skin 2 (two) times daily. 07/16/17   Charlott Rakes, MD  Insulin Pen Needle (PEN NEEDLES) 31G X 5 MM MISC 10 Units by Does not apply route 3 (three) times daily. 09/29/17   Charlott Rakes, MD  Insulin Syringes, Disposable, U-100 1 ML MISC 1 each by Does not apply route 3 (three) times daily. 12/10/16   Charlott Rakes, MD  lisinopril (PRINIVIL,ZESTRIL) 5 MG tablet Take 1 tablet (5 mg total) by mouth at bedtime. 07/16/17   Charlott Rakes, MD  naproxen (NAPROSYN) 500 MG tablet Take 1 tablet (500 mg total) by mouth 2 (two) times daily with a meal. 07/16/17   Charlott Rakes, MD  polyethylene glycol powder (GLYCOLAX/MIRALAX) powder Take 17 g by mouth 2 (two) times daily as needed. Patient not taking: Reported on 04/22/2017 11/25/16   Charlott Rakes, MD  pregabalin (LYRICA) 100 MG capsule Take 1 capsule (100 mg total) by mouth 2 (two) times daily. Patient not taking: Reported on 04/22/2017 02/11/17   Argentina Donovan, PA-C  sildenafil (VIAGRA) 25 MG tablet Take 1 tablet (25 mg total) by mouth daily as needed for erectile dysfunction. Patient not taking: Reported on 04/22/2017 06/07/15   Tresa Garter, MD  tobramycin (TOBREX) 0.3 % ophthalmic solution Place 2 drops into the right eye every 4 (four) hours. Patient not taking: Reported on 04/22/2017 02/15/17   Orlie Dakin, MD    Family History Family History  Problem Relation Age of Onset  . Cancer Mother   . Aneurysm Father        brain    Social History Social History   Tobacco Use  . Smoking status: Current Every Day Smoker    Packs/day: 0.10    Types: Cigarettes  . Smokeless tobacco: Never Used  . Tobacco comment: 3 cigs daily  Substance Use Topics  . Alcohol use: No    Comment: patient states he quit drinking since he has been sick  . Drug use: No    Comment: 2 weeks     Allergies   Other and Vancomycin   Review  of Systems Review of Systems Ten systems reviewed and are negative for acute change, except as noted in the HPI.    Physical Exam Updated Vital Signs BP (!) 141/94 (BP Location: Right Arm)   Pulse (!) 101   Temp 98.2 F (36.8 C) (Oral)   Resp 17   Ht _0  (1.981 m)   Wt 109.8 kg (242 lb)   SpO2 100%   BMI 27.97 kg/m   Physical Exam Physical Exam  Nursing note and vitals reviewed. Constitutional: He appears well-developed and well-nourished. No distress.  HENT:  Head: Normocephalic and atraumatic.  Eyes: Conjunctivae normal are  normal. No scleral icterus.  Neck: Normal range of motion. Neck supple.  Cardiovascular: Normal rate, regular rhythm and normal heart sounds.   Pulmonary/Chest: Effort normal and breath sounds normal. No respiratory distress.  Abdominal: Soft. There is no tenderness.  Musculoskeletal: He exhibits no edema. Left great toe status post amputation.  Thick callus development over the distal metatarsal region.  Appears to be a split at the center of the incision line.  There is  serous discharge without purulence, redness, heat. Neurological: He is alert.  Skin: Skin is warm and dry. He is not diaphoretic.  Psychiatric: His behavior is normal.         ED Treatments / Results  Labs (all labs ordered are listed, but only abnormal results are displayed) Labs Reviewed  COMPREHENSIVE METABOLIC PANEL  CBC WITH DIFFERENTIAL/PLATELET  I-STAT CG4 LACTIC ACID, ED    EKG None  Radiology No results found.  Procedures Procedures (including critical care time)  Medications Ordered in ED Medications - No data to display   Initial Impression / Assessment and Plan / ED Course  I have reviewed the triage vital signs and the nursing notes.  Pertinent labs & imaging results that were available during my care of the patient were reviewed by me and considered in my medical decision making (see chart for details).     Patient with opening to an old  incision site.  His irritation occurred about 1 year ago.  He likely secondary to dried callus over the foot and increased pressure on the foot with ambulation this week.  There is no evidence of infection.  Seen by Dr. Ellender Hose who agrees.  His labs and imaging are negative for any acute abnormalities.  Patient encouraged to maintain healthy wound care and follow-up closely with Dr. due to.  I discussed return precautions.  He appears appropriate for discharge at this time  Final Clinical Impressions(s) / ED Diagnoses   Final diagnoses:  None    ED Discharge Orders    None       Margarita Mail, PA-C 11/27/17 1616    Duffy Bruce, MD 11/27/17 [phone]

## 2017-11-27 NOTE — ED Notes (Signed)
Patient transported to X-ray 

## 2017-11-29 ENCOUNTER — Ambulatory Visit (INDEPENDENT_AMBULATORY_CARE_PROVIDER_SITE_OTHER): Payer: Medicaid Other | Admitting: Orthopedic Surgery

## 2017-11-29 ENCOUNTER — Encounter (INDEPENDENT_AMBULATORY_CARE_PROVIDER_SITE_OTHER): Payer: Self-pay | Admitting: Orthopedic Surgery

## 2017-11-29 ENCOUNTER — Telehealth: Payer: Self-pay | Admitting: Family Medicine

## 2017-11-29 DIAGNOSIS — L97521 Non-pressure chronic ulcer of other part of left foot limited to breakdown of skin: Secondary | ICD-10-CM

## 2017-11-29 DIAGNOSIS — Z89422 Acquired absence of other left toe(s): Secondary | ICD-10-CM | POA: Diagnosis not present

## 2017-11-29 DIAGNOSIS — Z89412 Acquired absence of left great toe: Secondary | ICD-10-CM

## 2017-11-29 NOTE — Progress Notes (Signed)
Office Visit Note   Patient: Ryan Shaw           Date of Birth: 1966/03/07           MRN: [id number] Visit Date: 11/29/2017              Requested by: Hoy Register, MD [address] [place], [place] [address] PCP: Hoy Register, MD  Chief Complaint  Patient presents with  . Left Foot - Pain, Follow-up      HPI: Patient is a 52 year old gentleman with diabetic insensate neuropathy status post amputation of the great toe and second toe left foot.  Patient states that he applied for disability and this was denied he went back to work and developed ulceration over the left forefoot.  Patient denies any fever or chills.  Assessment & Plan: Visit Diagnoses:  1. Amputee, great toe, left (HCC)   2. H/O amputation of lesser toe, left (HCC)   3. Ulcer of toe of left foot, limited to breakdown of skin (HCC)     Plan: The ulcer was debrided of skin and soft tissue.  Patient will use antibiotic ointment and a Band-Aid dressing change daily reevaluate in 3 weeks.  Discussed that patient will need to be off his foot is much as possible he should not be working on his foot patient is at risk of transtibial amputation on the left with weightbearing.  Follow-Up Instructions: Return in about 3 weeks (around 12/20/2017).   Ortho Exam  Patient is alert, oriented, no adenopathy, well-dressed, normal affect, normal respiratory effort. Examination patient is a strong dorsalis pedis pulse.  He has a large ulcer over the great toe amputation site the second toe amputation site is well-healed.  After informed consent a 10 blade knife was used to debride the skin and soft tissue back to healthy viable tissue the ulcer is 3 cm in diameter and 5 mm deep.  Silver nitrate was used for hemostasis Iodosorb and a Band-Aid was applied there was good petechial bleeding there was no exposed bone or tendon.  Patient's most recent hemoglobin A1c is 13.8.  Imaging: No results found. No images are  attached to the encounter.  Labs: Lab Results  Component Value Date   HGBA1C 13.8 07/16/2017   HGBA1C 8.7 02/11/2017   HGBA1C 11.3 11/25/2016   ESRSEDRATE 57 (H) 11/12/2016   ESRSEDRATE 30 (H) 11/09/2016   ESRSEDRATE 41 (H) 10/02/2016   CRP 1.2 (H) 12/15/2016   CRP 5.7 (H) 11/12/2016   CRP 2.3 (H) 11/09/2016   LABURIC 5.1 11/10/2016   REPTSTATUS 12/20/2016 FINAL 12/15/2016   GRAMSTAIN  11/12/2016    ABUNDANT WBC PRESENT,BOTH PMN AND MONONUCLEAR FEW GRAM POSITIVE COCCI IN CLUSTERS    CULT NO GROWTH 5 DAYS 12/15/2016   LABORGA METHICILLIN RESISTANT STAPHYLOCOCCUS AUREUS 11/12/2016    (HGBA1)@  There is no height or weight on file to calculate BMI.  Orders:  No orders of the defined types were placed in this encounter.  No orders of the defined types were placed in this encounter.    Procedures: No procedures performed  Clinical Data: No additional findings.  ROS:  All other systems negative, except as noted in the HPI. Review of Systems  Objective: Vital Signs: There were no vitals taken for this visit.  Specialty Comments:  No specialty comments available.  PMFS History: Patient Active Problem List   Diagnosis Date Noted  . Anxiety and depression 01/04/2017  . H/O amputation of lesser toe, left (HCC)  12/25/2016  . Hyperlipidemia 12/15/2016  . MRSA infection   . Abscess of bursa of right hip   . Cellulitis of left lower extremity   . Fever   . Right hip pain   . Acute right ankle pain   . Idiopathic chronic gout of left ankle without tophus   . Amputee, great toe, left (HCC) 10/08/2016  . Diabetic foot ulcer (HCC) 10/08/2016  . 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 Acquired contracture of Achilles tendon, left   . Essential hypertension 10/02/2016  . Bilateral chronic knee pain 07/09/2015  . Type 2 diabetes mellitus with diabetic neuropathy, with long-term current use of insulin (HCC) 11/01/2014  . Diabetic  peripheral neuropathy (HCC) 10/25/2014   Past Medical History:  Diagnosis Date  . Arthritis   . CKD (chronic kidney disease)   . Diabetes mellitus without complication (HCC)   . Edema   . Hypercholesteremia   . Hypertension   . Neuropathy     Family History  Problem Relation Age of Onset  . Cancer Mother   . Aneurysm Father        brain    Past Surgical History:  Procedure Laterality Date  . AMPUTATION Right 02/17/2015   Procedure: SECOND RAY AMPUTATION;  Surgeon: Kathryne Hitch, MD;  Location: [facility] OR;  Service: Orthopedics;  Laterality: Right;  . AMPUTATION Left 10/04/2016   Procedure: GREAT TOE AMPUTATION GASTROCNEMIA RESECTION;  Surgeon: Nadara Mustard, MD;  Location: [facility] OR;  Service: Orthopedics;  Laterality: Left;  . AMPUTATION Left 12/16/2016   Procedure: LEFT FOOT 2ND TOE AMPUTATION;  Surgeon: Nadara Mustard, MD;  Location: [facility] OR;  Service: Orthopedics;  Laterality: Left;  . HIP SURGERY Right   . INCISION AND DRAINAGE HIP Right 11/12/2016   Procedure: IRRIGATION AND DEBRIDEMENT RIGHT HIP;  Surgeon: Nadara Mustard, MD;  Location: [facility] OR;  Service: Orthopedics;  Laterality: Right;   Social History   Occupational History  . Not on file  Tobacco Use  . Smoking status: Current Every Day Smoker    Packs/day: 0.10    Types: Cigarettes  . Smokeless tobacco: Never Used  . Tobacco comment: 3 cigs daily  Substance and Sexual Activity  . Alcohol use: No    Comment: patient states he quit drinking since he has been sick  . Drug use: No    Comment: 2 weeks  . Sexual activity: Not on file

## 2017-11-29 NOTE — Telephone Encounter (Signed)
William from [facility] [employer] called to check on patients back and knee brace. Please fu at  Your earliest convenience.

## 2017-11-30 NOTE — Telephone Encounter (Signed)
I will be on the look out for this if a paperwork was sent.  Please inform them of the turnaround time for paperwork. Thanks.

## 2017-11-30 NOTE — Telephone Encounter (Signed)
Ryan Shaw was informed of turnaround time, prior to note.

## 2017-12-09 NOTE — Telephone Encounter (Signed)
ON have been faxed over.

## 2017-12-09 NOTE — Telephone Encounter (Signed)
Could you please address this?  Thank you 

## 2017-12-09 NOTE — Telephone Encounter (Signed)
Called back asking for clinic notes for support of brace.  On the left knee with in the last 12 months.  Please fax to [phone].

## 2017-12-21 ENCOUNTER — Encounter (INDEPENDENT_AMBULATORY_CARE_PROVIDER_SITE_OTHER): Payer: Self-pay | Admitting: Orthopedic Surgery

## 2017-12-21 ENCOUNTER — Ambulatory Visit (INDEPENDENT_AMBULATORY_CARE_PROVIDER_SITE_OTHER): Payer: Medicaid Other | Admitting: Orthopedic Surgery

## 2017-12-21 VITALS — Ht 78.0 in | Wt 242.0 lb

## 2017-12-21 DIAGNOSIS — Z89412 Acquired absence of left great toe: Secondary | ICD-10-CM | POA: Diagnosis not present

## 2017-12-21 DIAGNOSIS — L97521 Non-pressure chronic ulcer of other part of left foot limited to breakdown of skin: Secondary | ICD-10-CM

## 2017-12-21 NOTE — Progress Notes (Signed)
Office Visit Note   Patient: Ryan Shaw           Date of Birth: Dec 30, 1965           MRN: [id number] Visit Date: 12/21/2017              Requested by: Hoy Register, MD [address] [place], [place] [address] PCP: Hoy Register, MD  Chief Complaint  Patient presents with  . Left Foot - Follow-up    12/16/16 left foot 2nd toe amputation       HPI: Patient is a 52 year old gentleman 1 years status post amputation of the left foot second toe with an ulceration over the great toe amputation site.  Assessment & Plan: Visit Diagnoses:  1. Amputee, great toe, left (HCC)   2. Ulcer of toe of left foot, limited to breakdown of skin (HCC)     Plan: Ulcer was debrided of skin and soft tissue.  Recommend patient continue with ointment recommended getting on a walker to unload pressure from the left forefoot.  Follow-Up Instructions: Return in about 3 weeks (around 01/11/2018).   Ortho Exam  Patient is alert, oriented, no adenopathy, well-dressed, normal affect, normal respiratory effort. Examination patient's foot is plantigrade he has no ascending cellulitis swelling or purulent drainage.  He has a Waggoner grade 1 ulcer from the great toe amputation site the second toe amputation site is healed well.  After informed consent a 10 blade knife was used to debride the skin and soft tissue back to healthy viable granulation tissue there is no exposed bone no exposed tendon the ulcer is 3 cm in diameter and 3 mm deep.  Imaging: No results found. No images are attached to the encounter.  Labs: Lab Results  Component Value Date   HGBA1C 13.8 07/16/2017   HGBA1C 8.7 02/11/2017   HGBA1C 11.3 11/25/2016   ESRSEDRATE 57 (H) 11/12/2016   ESRSEDRATE 30 (H) 11/09/2016   ESRSEDRATE 41 (H) 10/02/2016   CRP 1.2 (H) 12/15/2016   CRP 5.7 (H) 11/12/2016   CRP 2.3 (H) 11/09/2016   LABURIC 5.1 11/10/2016   REPTSTATUS 12/20/2016 FINAL 12/15/2016   GRAMSTAIN  11/12/2016    ABUNDANT  WBC PRESENT,BOTH PMN AND MONONUCLEAR FEW GRAM POSITIVE COCCI IN CLUSTERS    CULT NO GROWTH 5 DAYS 12/15/2016   LABORGA METHICILLIN RESISTANT STAPHYLOCOCCUS AUREUS 11/12/2016     Lab Results  Component Value Date   ALBUMIN 3.3 (L) 11/27/2017   ALBUMIN 3.8 11/28/2016   ALBUMIN 4.2 11/25/2016   PREALBUMIN 18.7 11/12/2016   LABURIC 5.1 11/10/2016    Body mass index is 27.97 kg/m.  Orders:  No orders of the defined types were placed in this encounter.  No orders of the defined types were placed in this encounter.    Procedures: No procedures performed  Clinical Data: No additional findings.  ROS:  All other systems negative, except as noted in the HPI. Review of Systems  Objective: Vital Signs: Ht  (1.981 m)   Wt 242 lb (109.8 kg)   BMI 27.97 kg/m   Specialty Comments:  No specialty comments available.  PMFS History: Patient Active Problem List   Diagnosis Date Noted  . Anxiety and depression 01/04/2017  . H/O amputation of lesser toe, left (HCC) 12/25/2016  . Hyperlipidemia 12/15/2016  . MRSA infection   . Abscess of bursa of right hip   . Cellulitis of left lower extremity   . Fever   . Right hip pain   .  Acute right ankle pain   . Idiopathic chronic gout of left ankle without tophus   . Amputee, great toe, left (HCC) 10/08/2016  . Diabetic foot ulcer (HCC) 10/08/2016  . 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 Acquired contracture of Achilles tendon, left   . Essential hypertension 10/02/2016  . Bilateral chronic knee pain 07/09/2015  . Type 2 diabetes mellitus with diabetic neuropathy, with long-term current use of insulin (HCC) 11/01/2014  . Diabetic peripheral neuropathy (HCC) 10/25/2014   Past Medical History:  Diagnosis Date  . Arthritis   . CKD (chronic kidney disease)   . Diabetes mellitus without complication (HCC)   . Edema   . Hypercholesteremia   . Hypertension   . Neuropathy     Family History  Problem  Relation Age of Onset  . Cancer Mother   . Aneurysm Father        brain    Past Surgical History:  Procedure Laterality Date  . AMPUTATION Right 02/17/2015   Procedure: SECOND RAY AMPUTATION;  Surgeon: Kathryne Hitch, MD;  Location: [facility] OR;  Service: Orthopedics;  Laterality: Right;  . AMPUTATION Left 10/04/2016   Procedure: GREAT TOE AMPUTATION GASTROCNEMIA RESECTION;  Surgeon: Nadara Mustard, MD;  Location: [facility] OR;  Service: Orthopedics;  Laterality: Left;  . AMPUTATION Left 12/16/2016   Procedure: LEFT FOOT 2ND TOE AMPUTATION;  Surgeon: Nadara Mustard, MD;  Location: [facility] OR;  Service: Orthopedics;  Laterality: Left;  . HIP SURGERY Right   . INCISION AND DRAINAGE HIP Right 11/12/2016   Procedure: IRRIGATION AND DEBRIDEMENT RIGHT HIP;  Surgeon: Nadara Mustard, MD;  Location: [facility] OR;  Service: Orthopedics;  Laterality: Right;   Social History   Occupational History  . Not on file  Tobacco Use  . Smoking status: Current Every Day Smoker    Packs/day: 0.10    Types: Cigarettes  . Smokeless tobacco: Never Used  . Tobacco comment: 3 cigs daily  Substance and Sexual Activity  . Alcohol use: No    Comment: patient states he quit drinking since he has been sick  . Drug use: No    Comment: 2 weeks  . Sexual activity: Not on file

## 2018-01-06 ENCOUNTER — Encounter (INDEPENDENT_AMBULATORY_CARE_PROVIDER_SITE_OTHER): Payer: Self-pay | Admitting: Orthopedic Surgery

## 2018-01-06 ENCOUNTER — Ambulatory Visit (INDEPENDENT_AMBULATORY_CARE_PROVIDER_SITE_OTHER): Payer: Self-pay | Admitting: Orthopedic Surgery

## 2018-01-06 VITALS — Ht 78.0 in | Wt 242.0 lb

## 2018-01-06 DIAGNOSIS — Z89421 Acquired absence of other right toe(s): Secondary | ICD-10-CM

## 2018-01-06 DIAGNOSIS — Z89412 Acquired absence of left great toe: Secondary | ICD-10-CM

## 2018-01-06 DIAGNOSIS — L97521 Non-pressure chronic ulcer of other part of left foot limited to breakdown of skin: Secondary | ICD-10-CM

## 2018-01-06 NOTE — Progress Notes (Signed)
Office Visit Note   Patient: Ryan Shaw           Date of Birth: 03/18/1966           MRN: [id number] Visit Date: 01/06/2018              Requested by: Hoy Register, MD [address] [place], [place] [address] PCP: Hoy Register, MD  Chief Complaint  Patient presents with  . Left Foot - Routine Post Op    12/16/17 left foot 2nd toe amputation       HPI: Patient is a 52 year old gentleman who presents for follow-up status post left great toe amputation.  Patient states that his disability was not approved he states that he has had to go back to work.  He states the wound is broken down he has odor and ulceration has been using antibiotic ointments.  Assessment & Plan: Visit Diagnoses:  1. Amputee, great toe, left (HCC)   2. Ulcer of toe of left foot, limited to breakdown of skin (HCC)     Plan: Discussed with patient that he cannot work on his foot.  Discussed that he would probably need to be out of work for at least a year.  Patient is at risk for further breakdown and potential for amputation of his foot if this does not heal.  We will apply antibiotic ointment and dressing he will change the dressing daily plus antibiotic ointment and get on the walker and stay off his foot.  Follow-Up Instructions: Return in about 1 week (around 01/13/2018).   Ortho Exam  Patient is alert, oriented, no adenopathy, well-dressed, normal affect, normal respiratory effort. Examination patient has breakdown of the wound left foot great toe amputation.  Is a strong dorsalis pedis pulse with good arterial inflow the ulcer is 2 cm in diameter 2 mm deep with healthy granulation tissue there is an odor but no cellulitis no exposed bone no exposed tendon no signs of infection.  Imaging: No results found. No images are attached to the encounter.  Labs: Lab Results  Component Value Date   HGBA1C 13.8 07/16/2017   HGBA1C 8.7 02/11/2017   HGBA1C 11.3 11/25/2016   ESRSEDRATE 57 (H)  11/12/2016   ESRSEDRATE 30 (H) 11/09/2016   ESRSEDRATE 41 (H) 10/02/2016   CRP 1.2 (H) 12/15/2016   CRP 5.7 (H) 11/12/2016   CRP 2.3 (H) 11/09/2016   LABURIC 5.1 11/10/2016   REPTSTATUS 12/20/2016 FINAL 12/15/2016   GRAMSTAIN  11/12/2016    ABUNDANT WBC PRESENT,BOTH PMN AND MONONUCLEAR FEW GRAM POSITIVE COCCI IN CLUSTERS    CULT NO GROWTH 5 DAYS 12/15/2016   LABORGA METHICILLIN RESISTANT STAPHYLOCOCCUS AUREUS 11/12/2016     Lab Results  Component Value Date   ALBUMIN 3.3 (L) 11/27/2017   ALBUMIN 3.8 11/28/2016   ALBUMIN 4.2 11/25/2016   PREALBUMIN 18.7 11/12/2016   LABURIC 5.1 11/10/2016    Body mass index is 27.97 kg/m.  Orders:  No orders of the defined types were placed in this encounter.  No orders of the defined types were placed in this encounter.    Procedures: No procedures performed  Clinical Data: No additional findings.  ROS:  All other systems negative, except as noted in the HPI. Review of Systems  Objective: Vital Signs: Ht 6\' 6"  (1.981 m)   Wt 242 lb (109.8 kg)   BMI 27.97 kg/m   Specialty Comments:  No specialty comments available.  PMFS History: Patient Active Problem List   Diagnosis Date  Noted  . Anxiety and depression 01/04/2017  . H/O amputation of lesser toe, left (HCC) 12/25/2016  . Hyperlipidemia 12/15/2016  . MRSA infection   . Abscess of bursa of right hip   . Cellulitis of left lower extremity   . Fever   . Right hip pain   . Acute right ankle pain   . Idiopathic chronic gout of left ankle without tophus   . Amputee, great toe, left (HCC) 10/08/2016  . Diabetic foot ulcer (HCC) 10/08/2016  . 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 Acquired contracture of Achilles tendon, left   . Essential hypertension 10/02/2016  . Bilateral chronic knee pain 07/09/2015  . Type 2 diabetes mellitus with diabetic neuropathy, with long-term current use of insulin (HCC) 11/01/2014  . Diabetic peripheral neuropathy (HCC)  10/25/2014   Past Medical History:  Diagnosis Date  . Arthritis   . CKD (chronic kidney disease)   . Diabetes mellitus without complication (HCC)   . Edema   . Hypercholesteremia   . Hypertension   . Neuropathy     Family History  Problem Relation Age of Onset  . Cancer Mother   . Aneurysm Father        brain    Past Surgical History:  Procedure Laterality Date  . AMPUTATION Right 02/17/2015   Procedure: SECOND RAY AMPUTATION;  Surgeon: Kathryne Hitchhristopher Y Blackman, MD;  [facility];  Service: Orthopedics;  Laterality: Right;  . AMPUTATION Left 10/04/2016   Procedure: GREAT TOE AMPUTATION GASTROCNEMIA RESECTION;  Surgeon: Nadara MustardMarcus Alyna Stensland V, MD;  Location: [facility] OR;  Service: Orthopedics;  Laterality: Left;  . AMPUTATION Left 12/16/2016   Procedure: LEFT FOOT 2ND TOE AMPUTATION;  Surgeon: Nadara Mustarduda, Zahlia Deshazer V, MD;  [facility];  Service: Orthopedics;  Laterality: Left;  . HIP SURGERY Right   . INCISION AND DRAINAGE HIP Right 11/12/2016   Procedure: IRRIGATION AND DEBRIDEMENT RIGHT HIP;  Surgeon: Nadara MustardMarcus Dejuan Elman V, MD;  Location: [facility] OR;  Service: Orthopedics;  Laterality: Right;   Social History   Occupational History  . Not on file  Tobacco Use  . Smoking status: Current Every Day Smoker    Packs/day: 0.10    Types: Cigarettes  . Smokeless tobacco: Never Used  . Tobacco comment: 3 cigs daily  Substance and Sexual Activity  . Alcohol use: No    Comment: patient states he quit drinking since he has been sick  . Drug use: No    Comment: 2 weeks  . Sexual activity: Not on file

## 2018-01-07 ENCOUNTER — Encounter (INDEPENDENT_AMBULATORY_CARE_PROVIDER_SITE_OTHER): Payer: Self-pay | Admitting: Radiology

## 2018-01-07 ENCOUNTER — Other Ambulatory Visit: Payer: Self-pay | Admitting: Family Medicine

## 2018-01-07 MED FILL — !NOVOLOG FLEXPEN SYRINGE 1: 100/ML | 30 days supply | Qty: 9 | Fill #2

## 2018-01-07 MED FILL — !LANTUS SOLOSTAR 100UNITS/M: 100 | 25 days supply | Qty: 15 | Fill #3

## 2018-01-08 ENCOUNTER — Encounter (HOSPITAL_COMMUNITY): Payer: Self-pay | Admitting: *Deleted

## 2018-01-08 ENCOUNTER — Other Ambulatory Visit: Payer: Self-pay

## 2018-01-08 ENCOUNTER — Emergency Department (HOSPITAL_COMMUNITY)
Admission: EM | Admit: 2018-01-08 | Discharge: 2018-01-09 | Disposition: A | Discharge status: Home / Self Care | Payer: Medicaid Other | Attending: Emergency Medicine | Admitting: Emergency Medicine

## 2018-01-08 DIAGNOSIS — I129 Hypertensive chronic kidney disease with stage 1 through stage 4 chronic kidney disease, or unspecified chronic kidney disease: Secondary | ICD-10-CM | POA: Diagnosis not present

## 2018-01-08 DIAGNOSIS — E1140 Type 2 diabetes mellitus with diabetic neuropathy, unspecified: Secondary | ICD-10-CM | POA: Insufficient documentation

## 2018-01-08 DIAGNOSIS — F17210 Nicotine dependence, cigarettes, uncomplicated: Secondary | ICD-10-CM | POA: Insufficient documentation

## 2018-01-08 DIAGNOSIS — R252 Cramp and spasm: Secondary | ICD-10-CM | POA: Diagnosis present

## 2018-01-08 DIAGNOSIS — N189 Chronic kidney disease, unspecified: Secondary | ICD-10-CM | POA: Insufficient documentation

## 2018-01-08 DIAGNOSIS — Z794 Long term (current) use of insulin: Secondary | ICD-10-CM | POA: Diagnosis not present

## 2018-01-08 DIAGNOSIS — E1122 Type 2 diabetes mellitus with diabetic chronic kidney disease: Secondary | ICD-10-CM | POA: Insufficient documentation

## 2018-01-08 DIAGNOSIS — Z79899 Other long term (current) drug therapy: Secondary | ICD-10-CM | POA: Insufficient documentation

## 2018-01-08 NOTE — ED Triage Notes (Signed)
The pt is c/o aching all over his body especially his legs and arms for 1-2 hours.  He arrived by [facility] to triage  Iv per ems

## 2018-01-09 LAB — COMPREHENSIVE METABOLIC PANEL
ALT: 32 U/L (ref 17–63)
AST: 22 U/L (ref 15–41)
Albumin: 3.5 g/dL (ref 3.5–5.0)
Alkaline Phosphatase: 132 U/L — ABNORMAL HIGH (ref 38–126)
Anion gap: 8 (ref 5–15)
BUN: 19 mg/dL (ref 6–20)
CHLORIDE: 100 mmol/L — AB (ref 101–111)
CO2: 27 mmol/L (ref 22–32)
CREATININE: 1.46 mg/dL — AB (ref 0.61–1.24)
Calcium: 8.9 mg/dL (ref 8.9–10.3)
GFR calc non Af Amer: 54 mL/min — ABNORMAL LOW (ref >60)
Glucose, Bld: 160 mg/dL — ABNORMAL HIGH (ref 65–99)
Potassium: 4.4 mmol/L (ref 3.5–5.1)
SODIUM: 135 mmol/L (ref 135–145)
Total Bilirubin: 0.5 mg/dL (ref 0.3–1.2)
Total Protein: 7 g/dL (ref 6.5–8.1)

## 2018-01-09 LAB — CBC
HEMATOCRIT: 42 % (ref 39.0–52.0)
Hemoglobin: 13.7 g/dL (ref 13.0–17.0)
MCH: 27 pg (ref 26.0–34.0)
MCHC: 32.6 g/dL (ref 30.0–36.0)
MCV: 82.7 fL (ref 78.0–100.0)
PLATELETS: 302 10*3/uL (ref 150–400)
RBC: 5.08 MIL/uL (ref 4.22–5.81)
RDW: 12.2 % (ref 11.5–15.5)
WBC: 5.8 10*3/uL (ref 4.0–10.5)

## 2018-01-09 LAB — CK: Total CK: 203 U/L (ref 49–397)

## 2018-01-09 NOTE — ED Provider Notes (Signed)
[facility] EMERGENCY DEPARTMENT Provider Note   CSN: [id number] Arrival date & time: 01/08/18  2315     History   Chief Complaint Chief Complaint  Patient presents with  . Generalized Body Aches    HPI Ryan Shaw is a 52 y.o. male.  HPI  This is a 52 year old male with a history of diabetes, chronic kidney disease, hypertension, hypercholesterolemia, neuropathy who presents with bilateral leg cramping.  Cramping started approximately 2 hours prior to arrival.  He reports mostly bilateral thigh cramping.  Denies any injury.  Denies any strenuous activity.  At baseline he takes gabapentin for neuropathy but "this never helps."  He has not taken anything additional for his pain.  Currently he rates his pain at 7 out of 10.  Denies any numbness or tingling of the lower extremities.  Denies any systemic symptoms including fevers, cough, chest pain.  Past Medical History:  Diagnosis Date  . Arthritis   . CKD (chronic kidney disease)   . Diabetes mellitus without complication ([place])   . Edema   . Hypercholesteremia   . Hypertension   . Neuropathy     Patient Active Problem List   Diagnosis Date Noted  . Anxiety and depression 01/04/2017  . H/O amputation of lesser toe, left ([place]) 12/25/2016  . Hyperlipidemia 12/15/2016  . MRSA infection   . Abscess of bursa of right hip   . Cellulitis of left lower extremity   . Fever   . Right hip pain   . Acute right ankle pain   . Idiopathic chronic gout of left ankle without tophus   . Amputee, great toe, left ([place]) 10/08/2016  . Diabetic foot ulcer ([place]) 10/08/2016  . Osteomyelitis due to type 2 diabetes mellitus ([place])   . Acquired contracture of Achilles tendon, left   . Essential hypertension 10/02/2016  . Bilateral chronic knee pain 07/09/2015  . Type 2 diabetes mellitus with diabetic neuropathy, with long-term current use of insulin ([place]) 11/01/2014  . Diabetic peripheral neuropathy ([place]) 10/25/2014     Past Surgical History:  Procedure Laterality Date  . AMPUTATION Right 02/17/2015   Procedure: SECOND RAY AMPUTATION;  Surgeon: [name], MD;  Location: [facility];  Service: Orthopedics;  Laterality: Right;  . AMPUTATION Left 10/04/2016   Procedure: GREAT TOE AMPUTATION GASTROCNEMIA RESECTION;  Surgeon: Newt Minion, MD;  Location: [facility];  Service: Orthopedics;  Laterality: Left;  . AMPUTATION Left 12/16/2016   Procedure: LEFT FOOT 2ND TOE AMPUTATION;  Surgeon: Newt Minion, MD;  Location: [facility];  Service: Orthopedics;  Laterality: Left;  . HIP SURGERY Right   . INCISION AND DRAINAGE HIP Right 11/12/2016   Procedure: IRRIGATION AND DEBRIDEMENT RIGHT HIP;  Surgeon: Newt Minion, MD;  Location: [facility];  Service: Orthopedics;  Laterality: Right;        Home Medications    Prior to Admission medications   Medication Sig Start Date End Date Taking? Authorizing Provider  ACCU-CHEK SOFTCLIX LANCETS lancets USE AS INSTRUCTED 11/18/17   Charlott Rakes, MD  atorvastatin (LIPITOR) 40 MG tablet Take 1 tablet (40 mg total) by mouth at bedtime. 07/16/17   Charlott Rakes, MD  Blood Glucose Monitoring Suppl (ACCU-CHEK AVIVA PLUS) w/Device KIT Use as directed 07/16/17   Charlott Rakes, MD  cyclobenzaprine (FLEXERIL) 10 MG tablet TAKE 1 TABLET BY MOUTH 2 TIMES DAILY AS NEEDED FOR MUSCLE SPASMS. 01/07/18   Charlott Rakes, MD  DULoxetine (CYMBALTA) 60 MG capsule Take 1 capsule (60 mg  total) by mouth daily. 07/16/17   Charlott Rakes, MD  gabapentin (NEURONTIN) 300 MG capsule Take 1 capsule (300 mg total) by mouth 2 (two) times daily. Stop once Lyrica arrives 07/16/17   Charlott Rakes, MD  glucose blood (ACCU-CHEK AVIVA PLUS) test strip Use as instructed 07/16/17   Charlott Rakes, MD  insulin aspart (NOVOLOG) 100 UNIT/ML FlexPen Inject 10 Units into the skin 3 (three) times daily with meals. 07/16/17   Charlott Rakes, MD  Insulin Glargine (LANTUS) 100 UNIT/ML Solostar Pen Inject 30 Units into  the skin 2 (two) times daily. 07/16/17   Charlott Rakes, MD  Insulin Pen Needle (PEN NEEDLES) 31G X 5 MM MISC 10 Units by Does not apply route 3 (three) times daily. 09/29/17   Charlott Rakes, MD  Insulin Syringes, Disposable, U-100 1 ML MISC 1 each by Does not apply route 3 (three) times daily. 12/10/16   Charlott Rakes, MD  lisinopril (PRINIVIL,ZESTRIL) 5 MG tablet Take 1 tablet (5 mg total) by mouth at bedtime. 07/16/17   Charlott Rakes, MD  naproxen (NAPROSYN) 500 MG tablet Take 1 tablet (500 mg total) by mouth 2 (two) times daily with a meal. 07/16/17   Charlott Rakes, MD  polyethylene glycol powder (GLYCOLAX/MIRALAX) powder Take 17 g by mouth 2 (two) times daily as needed. 11/25/16   Charlott Rakes, MD  pregabalin (LYRICA) 100 MG capsule Take 1 capsule (100 mg total) by mouth 2 (two) times daily. 02/11/17   Argentina Donovan, PA-C  sildenafil (VIAGRA) 25 MG tablet Take 1 tablet (25 mg total) by mouth daily as needed for erectile dysfunction. 06/07/15   Tresa Garter, MD  tobramycin (TOBREX) 0.3 % ophthalmic solution Place 2 drops into the right eye every 4 (four) hours. 02/15/17   Orlie Dakin, MD    Family History Family History  Problem Relation Age of Onset  . Cancer Mother   . Aneurysm Father        brain    Social History Social History   Tobacco Use  . Smoking status: Current Every Day Smoker    Packs/day: 0.10    Types: Cigarettes  . Smokeless tobacco: Never Used  . Tobacco comment: 3 cigs daily  Substance Use Topics  . Alcohol use: No    Comment: patient states he quit drinking since he has been sick  . Drug use: No    Comment: 2 weeks     Allergies   Other and Vancomycin   Review of Systems Review of Systems  Constitutional: Negative for fever.  Respiratory: Negative for cough and shortness of breath.   Cardiovascular: Negative for chest pain.  Gastrointestinal: Negative for abdominal pain, nausea and vomiting.  Musculoskeletal:       Bilateral  lower extremity muscle cramps  Skin: Negative for color change and wound.  All other systems reviewed and are negative.    Physical Exam Updated Vital Signs BP 105/72 (BP Location: Right Arm)   Pulse 91   Temp 98.6 F (37 C) (Oral)   Resp 15   Ht 6' 6"  (1.981 m)   Wt 109.8 kg (242 lb)   SpO2 100%   BMI 27.97 kg/m   Physical Exam  Constitutional: He is oriented to person, place, and time. He appears well-developed and well-nourished. No distress.  HENT:  Head: Normocephalic and atraumatic.  Cardiovascular: Normal rate, regular rhythm and normal heart sounds.  No murmur heard. Pulmonary/Chest: Effort normal and breath sounds normal. No respiratory distress. He has no wheezes.  Abdominal: Soft. There is no tenderness.  Musculoskeletal: He exhibits no edema.  No tenderness palpation bilateral lower extremities, no crepitus, no swelling noted, 2+ DP pulse  Neurological: He is alert and oriented to person, place, and time.  Skin: Skin is warm and dry.  Psychiatric: He has a normal mood and affect.  Nursing note and vitals reviewed.    ED Treatments / Results  Labs (all labs ordered are listed, but only abnormal results are displayed) Labs Reviewed  COMPREHENSIVE METABOLIC PANEL - Abnormal; Notable for the following components:      Result Value   Chloride 100 (*)    Glucose, Bld 160 (*)    Creatinine, Ser 1.46 (*)    Alkaline Phosphatase 132 (*)    GFR calc non Af Amer 54 (*)    All other components within normal limits  CBC  CK    EKG None  Radiology No results found.  Procedures Procedures (including critical care time)  Medications Ordered in ED Medications - No data to display   Initial Impression / Assessment and Plan / ED Course  I have reviewed the triage vital signs and the nursing notes.  Pertinent labs & imaging results that were available during my care of the patient were reviewed by me and considered in my medical decision making (see chart  for details).     Patient presents with atraumatic bilateral lower extremity cramping.  He is overall nontoxic-appearing.  Exam is benign.  Lab work obtained including chemistries and CK.  No significant metabolic derangement.  He does have a slight increase in his creatinine.  Could be related to dehydration.  CK is within normal limits.  Recommend making sure that he stay hydrated.  Continue gabapentin for neuropathy.  Tylenol as needed for pain.  After history, exam, and medical workup I feel the patient has been appropriately medically screened and is safe for discharge home. Pertinent diagnoses were discussed with the patient. Patient was given return precautions.   Final Clinical Impressions(s) / ED Diagnoses   Final diagnoses:  Leg cramping    ED Discharge Orders    None       Merryl Hacker, MD 01/09/18 [phone]

## 2018-01-09 NOTE — Discharge Instructions (Addendum)
You were seen today for muscle cramps.  Your work-up is reassuring.  Make sure to stay hydrated by drinking water and electrolyte rich fluid.  Follow-up with your primary physician.  Continue gabapentin for your neuropathy.

## 2018-01-11 ENCOUNTER — Ambulatory Visit (INDEPENDENT_AMBULATORY_CARE_PROVIDER_SITE_OTHER): Payer: Medicaid Other | Admitting: Orthopedic Surgery

## 2018-01-11 ENCOUNTER — Encounter (INDEPENDENT_AMBULATORY_CARE_PROVIDER_SITE_OTHER): Payer: Self-pay

## 2018-01-12 ENCOUNTER — Ambulatory Visit (INDEPENDENT_AMBULATORY_CARE_PROVIDER_SITE_OTHER): Payer: Medicaid Other | Admitting: Orthopedic Surgery

## 2018-01-18 ENCOUNTER — Encounter (INDEPENDENT_AMBULATORY_CARE_PROVIDER_SITE_OTHER): Payer: Self-pay | Admitting: Orthopedic Surgery

## 2018-01-18 ENCOUNTER — Ambulatory Visit (INDEPENDENT_AMBULATORY_CARE_PROVIDER_SITE_OTHER): Payer: Medicaid Other | Admitting: Orthopedic Surgery

## 2018-01-18 VITALS — Ht 78.0 in | Wt 242.0 lb

## 2018-01-18 DIAGNOSIS — L97521 Non-pressure chronic ulcer of other part of left foot limited to breakdown of skin: Secondary | ICD-10-CM | POA: Diagnosis not present

## 2018-01-18 NOTE — Progress Notes (Signed)
Office Visit Note   Patient: Ryan Shaw           Date of Birth: 01/21/1966           MRN: [id number] Visit Date: 01/18/2018              Requested by: Hoy RegisterNewlin, [place], MD [address] [place], [place] [id number] PCP: Hoy RegisterNewlin, [place]obong, MD  Chief Complaint  Patient presents with  . Left Foot - Follow-up    12/16/16 left foot second toe amputation       HPI: Patient is a [age] year old gentleman status post great toe amputation left foot he is full weightbearing in regular shoes.  Ulcer was debrided of skin and soft tissue wound and placement of Darco shoe he will minimize his weightbearing activities  Assessment & Plan: Visit Diagnoses:  1. Ulcer of toe of left foot, limited to breakdown of skin (HCC)     Plan:.  Follow-Up Instructions: Return in about 3 weeks (around 02/08/2018).   Ortho Exam  Patient is alert, oriented, no adenopathy, well-dressed, normal affect, normal respiratory effort. Examination patient has a good dorsalis pedis pulse there is no swelling no cellulitis in the foot.  He has good granulation tissue over the wound bed.  After informed consent a 10 blade knife was used to debride the skin and soft tissue back to bleeding viable granulation tissue the ulcer is 5 cm in diameter 1 mm deep this does not probe to bone or tendon there is no purulent drainage no abscess no signs of infection.  Iodosorb 4 x 4 and a Band-Aid was applied.  Imaging: No results found. No images are attached to the encounter.  Labs: Lab Results  Component Value Date   HGBA1C 13.8 07/16/2017   HGBA1C 8.7 02/11/2017   HGBA1C 11.3 11/25/2016   ESRSEDRATE 57 (H) 11/12/2016   ESRSEDRATE 30 (H) 11/09/2016   ESRSEDRATE 41 (H) 10/02/2016   CRP 1.2 (H) 12/15/2016   CRP 5.7 (H) 11/12/2016   CRP 2.3 (H) 11/09/2016   LABURIC 5.1 11/10/2016   REPTSTATUS 12/20/2016 FINAL 12/15/2016   GRAMSTAIN  11/12/2016    ABUNDANT WBC PRESENT,BOTH PMN AND MONONUCLEAR FEW GRAM POSITIVE COCCI IN  CLUSTERS    CULT NO GROWTH 5 DAYS 12/15/2016   LABORGA METHICILLIN RESISTANT STAPHYLOCOCCUS AUREUS 11/12/2016     Lab Results  Component Value Date   ALBUMIN 3.5 01/08/2018   ALBUMIN 3.3 (L) 11/27/2017   ALBUMIN 3.8 11/28/2016   PREALBUMIN 18.7 11/12/2016   LABURIC 5.1 11/10/2016    Body mass index is 27.97 kg/m.  Orders:  No orders of the defined types were placed in this encounter.  No orders of the defined types were placed in this encounter.    Procedures: No procedures performed  Clinical Data: No additional findings.  ROS:  All other systems negative, except as noted in the HPI. Review of Systems  Objective: Vital Signs: Ht 6\' 6"  (1.981 m)   Wt 242 lb (109.8 kg)   BMI 27.97 kg/m   Specialty Comments:  No specialty comments available.  PMFS History: Patient Active Problem List   Diagnosis Date Noted  . Anxiety and depression 01/04/2017  . H/O amputation of lesser toe, left (HCC) 12/25/2016  . Hyperlipidemia 12/15/2016  . MRSA infection   . Abscess of bursa of right hip   . Cellulitis of left lower extremity   . Fever   . Right hip pain   . Acute right ankle pain   .  Idiopathic chronic gout of left ankle without tophus   . Amputee, great toe, left (HCC) 10/08/2016  . Diabetic foot ulcer (HCC) 10/08/2016  . 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 Acquired contracture of Achilles tendon, left   . Essential hypertension 10/02/2016  . Bilateral chronic knee pain 07/09/2015  . Type 2 diabetes mellitus with diabetic neuropathy, with long-term current use of insulin (HCC) 11/01/2014  . Diabetic peripheral neuropathy (HCC) 10/25/2014   Past Medical History:  Diagnosis Date  . Arthritis   . CKD (chronic kidney disease)   . Diabetes mellitus without complication (HCC)   . Edema   . Hypercholesteremia   . Hypertension   . Neuropathy     Family History  Problem Relation Age of Onset  . Cancer Mother   . Aneurysm Father         brain    Past Surgical History:  Procedure Laterality Date  . AMPUTATION Right 02/17/2015   Procedure: SECOND RAY AMPUTATION;  Surgeon: Kathryne Hitch, MD;  Location: [facility] OR;  Service: Orthopedics;  Laterality: Right;  . AMPUTATION Left 10/04/2016   Procedure: GREAT TOE AMPUTATION GASTROCNEMIA RESECTION;  Surgeon: Nadara Mustard, MD;  Location: [facility] OR;  Service: Orthopedics;  Laterality: Left;  . AMPUTATION Left 12/16/2016   Procedure: LEFT FOOT 2ND TOE AMPUTATION;  Surgeon: Nadara Mustard, MD;  Location: [facility] OR;  Service: Orthopedics;  Laterality: Left;  . HIP SURGERY Right   . INCISION AND DRAINAGE HIP Right 11/12/2016   Procedure: IRRIGATION AND DEBRIDEMENT RIGHT HIP;  Surgeon: Nadara Mustard, MD;  Location: [facility] OR;  Service: Orthopedics;  Laterality: Right;   Social History   Occupational History  . Not on file  Tobacco Use  . Smoking status: Current Every Day Smoker    Packs/day: 0.10    Types: Cigarettes  . Smokeless tobacco: Never Used  . Tobacco comment: 3 cigs daily  Substance and Sexual Activity  . Alcohol use: No    Comment: patient states he quit drinking since he has been sick  . Drug use: No    Comment: 2 weeks  . Sexual activity: Not on file

## 2018-01-19 ENCOUNTER — Telehealth: Payer: Self-pay | Admitting: Family Medicine

## 2018-01-31 DIAGNOSIS — E11621 Type 2 diabetes mellitus with foot ulcer: Secondary | ICD-10-CM

## 2018-01-31 HISTORY — DX: Type 2 diabetes mellitus with foot ulcer: E11.621

## 2018-02-08 ENCOUNTER — Ambulatory Visit (INDEPENDENT_AMBULATORY_CARE_PROVIDER_SITE_OTHER): Payer: Medicaid Other | Admitting: Orthopedic Surgery

## 2018-02-14 NOTE — Telephone Encounter (Signed)
Error

## 2018-02-16 ENCOUNTER — Inpatient Hospital Stay (HOSPITAL_COMMUNITY)
Admission: EM | Admit: 2018-02-16 | Discharge: 2018-02-22 | DRG: 617 | Disposition: A | Discharge status: Home / Self Care | Payer: Medicaid Other | Attending: Internal Medicine | Admitting: Internal Medicine

## 2018-02-16 ENCOUNTER — Emergency Department (HOSPITAL_COMMUNITY): Payer: Medicaid Other

## 2018-02-16 ENCOUNTER — Other Ambulatory Visit: Payer: Self-pay

## 2018-02-16 ENCOUNTER — Encounter (HOSPITAL_COMMUNITY): Payer: Self-pay

## 2018-02-16 DIAGNOSIS — L97511 Non-pressure chronic ulcer of other part of right foot limited to breakdown of skin: Secondary | ICD-10-CM | POA: Diagnosis not present

## 2018-02-16 DIAGNOSIS — N170 Acute kidney failure with tubular necrosis: Secondary | ICD-10-CM | POA: Diagnosis present

## 2018-02-16 DIAGNOSIS — S98112A Complete traumatic amputation of left great toe, initial encounter: Secondary | ICD-10-CM | POA: Diagnosis not present

## 2018-02-16 DIAGNOSIS — L03116 Cellulitis of left lower limb: Secondary | ICD-10-CM | POA: Diagnosis present

## 2018-02-16 DIAGNOSIS — L97509 Non-pressure chronic ulcer of other part of unspecified foot with unspecified severity: Secondary | ICD-10-CM

## 2018-02-16 DIAGNOSIS — E1165 Type 2 diabetes mellitus with hyperglycemia: Secondary | ICD-10-CM | POA: Diagnosis present

## 2018-02-16 DIAGNOSIS — D631 Anemia in chronic kidney disease: Secondary | ICD-10-CM | POA: Diagnosis present

## 2018-02-16 DIAGNOSIS — M79671 Pain in right foot: Secondary | ICD-10-CM | POA: Diagnosis present

## 2018-02-16 DIAGNOSIS — N183 Chronic kidney disease, stage 3 (moderate): Secondary | ICD-10-CM | POA: Diagnosis present

## 2018-02-16 DIAGNOSIS — A419 Sepsis, unspecified organism: Secondary | ICD-10-CM

## 2018-02-16 DIAGNOSIS — T383X6A Underdosing of insulin and oral hypoglycemic [antidiabetic] drugs, initial encounter: Secondary | ICD-10-CM | POA: Diagnosis present

## 2018-02-16 DIAGNOSIS — L089 Local infection of the skin and subcutaneous tissue, unspecified: Secondary | ICD-10-CM | POA: Diagnosis present

## 2018-02-16 DIAGNOSIS — E1100 Type 2 diabetes mellitus with hyperosmolarity without nonketotic hyperglycemic-hyperosmolar coma (NKHHC): Secondary | ICD-10-CM | POA: Diagnosis present

## 2018-02-16 DIAGNOSIS — E1142 Type 2 diabetes mellitus with diabetic polyneuropathy: Secondary | ICD-10-CM | POA: Diagnosis present

## 2018-02-16 DIAGNOSIS — M609 Myositis, unspecified: Secondary | ICD-10-CM | POA: Diagnosis present

## 2018-02-16 DIAGNOSIS — E11621 Type 2 diabetes mellitus with foot ulcer: Secondary | ICD-10-CM | POA: Diagnosis present

## 2018-02-16 DIAGNOSIS — E861 Hypovolemia: Secondary | ICD-10-CM | POA: Diagnosis present

## 2018-02-16 DIAGNOSIS — E1140 Type 2 diabetes mellitus with diabetic neuropathy, unspecified: Secondary | ICD-10-CM | POA: Diagnosis present

## 2018-02-16 DIAGNOSIS — E871 Hypo-osmolality and hyponatremia: Secondary | ICD-10-CM | POA: Diagnosis present

## 2018-02-16 DIAGNOSIS — Z881 Allergy status to other antibiotic agents status: Secondary | ICD-10-CM

## 2018-02-16 DIAGNOSIS — E11628 Type 2 diabetes mellitus with other skin complications: Secondary | ICD-10-CM | POA: Diagnosis present

## 2018-02-16 DIAGNOSIS — I129 Hypertensive chronic kidney disease with stage 1 through stage 4 chronic kidney disease, or unspecified chronic kidney disease: Secondary | ICD-10-CM | POA: Diagnosis present

## 2018-02-16 DIAGNOSIS — L0390 Cellulitis, unspecified: Secondary | ICD-10-CM | POA: Diagnosis not present

## 2018-02-16 DIAGNOSIS — Z8614 Personal history of Methicillin resistant Staphylococcus aureus infection: Secondary | ICD-10-CM

## 2018-02-16 DIAGNOSIS — L97521 Non-pressure chronic ulcer of other part of left foot limited to breakdown of skin: Secondary | ICD-10-CM | POA: Diagnosis not present

## 2018-02-16 DIAGNOSIS — R739 Hyperglycemia, unspecified: Secondary | ICD-10-CM | POA: Diagnosis not present

## 2018-02-16 DIAGNOSIS — I10 Essential (primary) hypertension: Secondary | ICD-10-CM | POA: Diagnosis present

## 2018-02-16 DIAGNOSIS — Z888 Allergy status to other drugs, medicaments and biological substances status: Secondary | ICD-10-CM

## 2018-02-16 DIAGNOSIS — E785 Hyperlipidemia, unspecified: Secondary | ICD-10-CM | POA: Diagnosis present

## 2018-02-16 DIAGNOSIS — F17210 Nicotine dependence, cigarettes, uncomplicated: Secondary | ICD-10-CM | POA: Diagnosis present

## 2018-02-16 DIAGNOSIS — E7849 Other hyperlipidemia: Secondary | ICD-10-CM | POA: Diagnosis not present

## 2018-02-16 DIAGNOSIS — E860 Dehydration: Secondary | ICD-10-CM | POA: Diagnosis present

## 2018-02-16 DIAGNOSIS — L97529 Non-pressure chronic ulcer of other part of left foot with unspecified severity: Secondary | ICD-10-CM | POA: Diagnosis present

## 2018-02-16 DIAGNOSIS — N189 Chronic kidney disease, unspecified: Secondary | ICD-10-CM | POA: Diagnosis present

## 2018-02-16 DIAGNOSIS — E1169 Type 2 diabetes mellitus with other specified complication: Secondary | ICD-10-CM | POA: Diagnosis present

## 2018-02-16 DIAGNOSIS — Z89412 Acquired absence of left great toe: Secondary | ICD-10-CM | POA: Diagnosis present

## 2018-02-16 DIAGNOSIS — E1122 Type 2 diabetes mellitus with diabetic chronic kidney disease: Secondary | ICD-10-CM | POA: Diagnosis present

## 2018-02-16 DIAGNOSIS — Z794 Long term (current) use of insulin: Secondary | ICD-10-CM | POA: Diagnosis not present

## 2018-02-16 DIAGNOSIS — Z5309 Procedure and treatment not carried out because of other contraindication: Secondary | ICD-10-CM

## 2018-02-16 HISTORY — DX: Non-pressure chronic ulcer of other part of unspecified foot with unspecified severity: L97.509

## 2018-02-16 HISTORY — DX: Type 2 diabetes mellitus with foot ulcer: E11.621

## 2018-02-16 LAB — CBC WITH DIFFERENTIAL/PLATELET
ABS IMMATURE GRANULOCYTES: 0 10*3/uL (ref 0.0–0.1)
BASOS ABS: 0.1 10*3/uL (ref 0.0–0.1)
Basophils Relative: 1 %
Eosinophils Absolute: 0.3 10*3/uL (ref 0.0–0.7)
Eosinophils Relative: 3 %
HCT: 41.9 % (ref 39.0–52.0)
HEMOGLOBIN: 13.6 g/dL (ref 13.0–17.0)
IMMATURE GRANULOCYTES: 0 %
LYMPHS PCT: 15 %
Lymphs Abs: 1.5 10*3/uL (ref 0.7–4.0)
MCH: 27.6 pg (ref 26.0–34.0)
MCHC: 32.5 g/dL (ref 30.0–36.0)
MCV: 85.2 fL (ref 78.0–100.0)
MONO ABS: 0.8 10*3/uL (ref 0.1–1.0)
Monocytes Relative: 8 %
NEUTROS ABS: 7.5 10*3/uL (ref 1.7–7.7)
NEUTROS PCT: 73 %
PLATELETS: 319 10*3/uL (ref 150–400)
RBC: 4.92 MIL/uL (ref 4.22–5.81)
RDW: 12.5 % (ref 11.5–15.5)
WBC: 10.2 10*3/uL (ref 4.0–10.5)

## 2018-02-16 LAB — URINALYSIS, ROUTINE W REFLEX MICROSCOPIC
BACTERIA UA: NONE SEEN
Bilirubin Urine: NEGATIVE
KETONES UR: NEGATIVE mg/dL
LEUKOCYTES UA: NEGATIVE
Nitrite: NEGATIVE
PH: 6 (ref 5.0–8.0)
Protein, ur: NEGATIVE mg/dL
SPECIFIC GRAVITY, URINE: 1.029 (ref 1.005–1.030)

## 2018-02-16 LAB — COMPREHENSIVE METABOLIC PANEL
ALBUMIN: 3.6 g/dL (ref 3.5–5.0)
ALT: 28 U/L (ref 0–44)
ANION GAP: 12 (ref 5–15)
AST: 27 U/L (ref 15–41)
Alkaline Phosphatase: 142 U/L — ABNORMAL HIGH (ref 38–126)
BILIRUBIN TOTAL: 0.6 mg/dL (ref 0.3–1.2)
BUN: 21 mg/dL — ABNORMAL HIGH (ref 6–20)
CO2: 21 mmol/L — AB (ref 22–32)
Calcium: 9 mg/dL (ref 8.9–10.3)
Chloride: 93 mmol/L — ABNORMAL LOW (ref 98–111)
Creatinine, Ser: 1.65 mg/dL — ABNORMAL HIGH (ref 0.61–1.24)
GFR calc non Af Amer: 47 mL/min — ABNORMAL LOW (ref >60)
GFR, EST AFRICAN AMERICAN: 54 mL/min — AB (ref >60)
GLUCOSE: 648 mg/dL — AB (ref 70–99)
POTASSIUM: 5 mmol/L (ref 3.5–5.1)
SODIUM: 126 mmol/L — AB (ref 135–145)
TOTAL PROTEIN: 7.4 g/dL (ref 6.5–8.1)

## 2018-02-16 LAB — SEDIMENTATION RATE: Sed Rate: 43 mm/hr — ABNORMAL HIGH (ref 0–16)

## 2018-02-16 LAB — C-REACTIVE PROTEIN: CRP: 5.9 mg/dL — AB (ref <1.0)

## 2018-02-16 LAB — GLUCOSE, CAPILLARY: GLUCOSE-CAPILLARY: 314 mg/dL — AB (ref 70–99)

## 2018-02-16 LAB — I-STAT CG4 LACTIC ACID, ED
LACTIC ACID, VENOUS: 2.6 mmol/L — AB (ref 0.5–1.9)
Lactic Acid, Venous: 1.67 mmol/L (ref 0.5–1.9)

## 2018-02-16 LAB — HEMOGLOBIN A1C
Hgb A1c MFr Bld: 10.8 % — ABNORMAL HIGH (ref 4.8–5.6)
MEAN PLASMA GLUCOSE: 263.26 mg/dL

## 2018-02-16 LAB — CBG MONITORING, ED: GLUCOSE-CAPILLARY: 291 mg/dL — AB (ref 70–99)

## 2018-02-16 LAB — PREALBUMIN: PREALBUMIN: 21.5 mg/dL (ref 18–38)

## 2018-02-16 MED ORDER — ATORVASTATIN CALCIUM 40 MG PO TABS
40.0000 mg | ORAL_TABLET | Freq: Every day | ORAL | Status: DC
  Administered 2018-02-17 – 2018-02-21 (×5): 40 mg via ORAL
  Filled 2018-02-16 (×5): qty 1

## 2018-02-16 MED ORDER — SODIUM CHLORIDE 0.9 % IV SOLN
INTRAVENOUS | Status: DC
  Administered 2018-02-17: 2.5 [IU]/h via INTRAVENOUS
  Filled 2018-02-16: qty 1

## 2018-02-16 MED ORDER — SODIUM CHLORIDE 0.9 % IV SOLN
INTRAVENOUS | Status: DC
  Administered 2018-02-16 – 2018-02-21 (×7): via INTRAVENOUS

## 2018-02-16 MED ORDER — HYDROCODONE-ACETAMINOPHEN 5-325 MG PO TABS
1.0000 | ORAL_TABLET | ORAL | Status: DC | PRN
  Administered 2018-02-16: 1 via ORAL
  Administered 2018-02-17 – 2018-02-22 (×16): 2 via ORAL
  Filled 2018-02-16 (×3): qty 2
  Filled 2018-02-16: qty 1
  Filled 2018-02-16 (×14): qty 2

## 2018-02-16 MED ORDER — SODIUM CHLORIDE 0.9 % IV BOLUS
1000.0000 mL | Freq: Once | INTRAVENOUS | Status: AC
  Administered 2018-02-16: 1000 mL via INTRAVENOUS

## 2018-02-16 MED ORDER — SODIUM CHLORIDE 0.9 % IV SOLN
2.0000 g | INTRAVENOUS | Status: AC
  Administered 2018-02-17 – 2018-02-21 (×6): 2 g via INTRAVENOUS
  Filled 2018-02-16 (×6): qty 20

## 2018-02-16 MED ORDER — VANCOMYCIN HCL 10 G IV SOLR
2000.0000 mg | Freq: Once | INTRAVENOUS | Status: AC
  Administered 2018-02-17: 2000 mg via INTRAVENOUS
  Filled 2018-02-16: qty 2000

## 2018-02-16 MED ORDER — ACETAMINOPHEN 325 MG PO TABS: 650.0000 mg | ORAL_TABLET | Freq: Four times a day (QID) | ORAL | Status: DC | PRN

## 2018-02-16 MED ORDER — PREGABALIN 100 MG PO CAPS: 100.0000 mg | ORAL_CAPSULE | Freq: Two times a day (BID) | ORAL | Status: DC

## 2018-02-16 MED ORDER — ACETAMINOPHEN 325 MG PO TABS
650.0000 mg | ORAL_TABLET | Freq: Once | ORAL | Status: AC
  Administered 2018-02-16: 650 mg via ORAL
  Filled 2018-02-16: qty 2

## 2018-02-16 MED ORDER — ONDANSETRON HCL 4 MG PO TABS: 4.0000 mg | ORAL_TABLET | Freq: Four times a day (QID) | ORAL | Status: DC | PRN

## 2018-02-16 MED ORDER — PIPERACILLIN-TAZOBACTAM 3.375 G IVPB 30 MIN
3.3750 g | Freq: Once | INTRAVENOUS | Status: AC
  Administered 2018-02-16: 3.375 g via INTRAVENOUS
  Filled 2018-02-16: qty 50

## 2018-02-16 MED ORDER — INSULIN ASPART 100 UNIT/ML ~~LOC~~ SOLN
10.0000 [IU] | Freq: Once | SUBCUTANEOUS | Status: AC
  Administered 2018-02-16: 10 [IU] via INTRAVENOUS
  Filled 2018-02-16: qty 1

## 2018-02-16 MED ORDER — CYCLOBENZAPRINE HCL 10 MG PO TABS
10.0000 mg | ORAL_TABLET | Freq: Two times a day (BID) | ORAL | Status: DC
  Administered 2018-02-16 – 2018-02-22 (×12): 10 mg via ORAL
  Filled 2018-02-16 (×12): qty 1

## 2018-02-16 MED ORDER — SODIUM CHLORIDE 0.9% FLUSH
3.0000 mL | Freq: Two times a day (BID) | INTRAVENOUS | Status: DC
  Administered 2018-02-17 – 2018-02-20 (×3): 3 mL via INTRAVENOUS

## 2018-02-16 MED ORDER — METRONIDAZOLE IN NACL 5-0.79 MG/ML-% IV SOLN
500.0000 mg | Freq: Three times a day (TID) | INTRAVENOUS | Status: AC
  Administered 2018-02-17 – 2018-02-22 (×17): 500 mg via INTRAVENOUS
  Filled 2018-02-16 (×17): qty 100

## 2018-02-16 MED ORDER — HYDROMORPHONE HCL 1 MG/ML IJ SOLN
1.0000 mg | Freq: Once | INTRAMUSCULAR | Status: AC
  Administered 2018-02-16: 1 mg via INTRAVENOUS
  Filled 2018-02-16: qty 1

## 2018-02-16 MED ORDER — DEXTROSE-NACL 5-0.45 % IV SOLN: INTRAVENOUS | Status: DC

## 2018-02-16 MED ORDER — DULOXETINE HCL 60 MG PO CPEP
60.0000 mg | ORAL_CAPSULE | Freq: Every day | ORAL | Status: DC
  Administered 2018-02-17 – 2018-02-22 (×6): 60 mg via ORAL
  Filled 2018-02-16 (×6): qty 1

## 2018-02-16 MED ORDER — ACETAMINOPHEN 650 MG RE SUPP: 650.0000 mg | Freq: Four times a day (QID) | RECTAL | Status: DC | PRN

## 2018-02-16 MED ORDER — VANCOMYCIN HCL IN DEXTROSE 750-5 MG/150ML-% IV SOLN
750.0000 mg | Freq: Two times a day (BID) | INTRAVENOUS | Status: DC
  Administered 2018-02-18 – 2018-02-22 (×8): 750 mg via INTRAVENOUS
  Filled 2018-02-16 (×9): qty 150

## 2018-02-16 MED ORDER — DAPTOMYCIN 500 MG IV SOLR
660.0000 mg | Freq: Once | INTRAVENOUS | Status: DC
  Administered 2018-02-16: 660 mg via INTRAVENOUS
  Filled 2018-02-16: qty 13.2

## 2018-02-16 MED ORDER — PIPERACILLIN-TAZOBACTAM 3.375 G IVPB: 3.3750 g | Freq: Three times a day (TID) | INTRAVENOUS | Status: DC

## 2018-02-16 MED ORDER — GABAPENTIN 300 MG PO CAPS: 300.0000 mg | ORAL_CAPSULE | Freq: Two times a day (BID) | ORAL | Status: DC

## 2018-02-16 MED ORDER — ONDANSETRON HCL 4 MG/2ML IJ SOLN: 4.0000 mg | Freq: Four times a day (QID) | INTRAMUSCULAR | Status: DC | PRN

## 2018-02-16 NOTE — ED Notes (Signed)
Aware of patient's critical lactic of 2.6, will prioritize rooming

## 2018-02-16 NOTE — H&P (Signed)
History and Physical    Ryan Shaw BWG:[id number] DOB: [birth year] DOA: 02/16/2018  PCP: Charlott Rakes, MD   Patient coming from: Home  Chief Complaint: Subjective fevers/chills; foot pain, swelling, drainage, and odor  HPI: Ryan Shaw is a 52 y.o. male with medical history significant for uncontrolled insulin-dependent diabetes mellitus, chronic kidney disease stage II-III, and chronic foot ulcers status post toe amputations followed by orthopedic surgery, and now presenting to the emergency department for evaluation of subjective fevers, chills, and increasing pain, swelling, drainage, and odor from his left foot wound.  Patient is status post amputation of the left first digit and has noted increasing redness, swelling, and pain at this site over the past week, as well as drainage and foul odor.  He reports subjective fevers and chills. Reports mild cough, but no dyspnea, no chest pain, and no palpitations.   ED Course: Upon arrival to the ED, patient is found to have a temp of 37.8 C, saturating well on room air, tachypnea, tachycardic, and with stable blood pressure.  EKG features a sinus tachycardia with rate 103 and chest x-ray is notable for low lung volumes with atelectasis or crowding.  Radiographs of the left foot are suspicious for chronic osteomyelitis involving the third toe, or possibly reflecting healed fracture.  There is no acute osseous abnormality on right foot plain films.  Chemistry panel is notable for glucose of 648, sodium 126, bicarbonate 21, and creatinine 1.65, up from 1.46 last month.  CBC is unremarkable and initial lactic acid is elevated to 2.60.  Blood cultures were collected, 2 L normal saline administered, and the patient was treated with daptomycin, Zosyn, 10 units IV NovoLog, acetaminophen, and IV Dilaudid in the ED.  Tachycardia improved, lactic acid normalized, blood pressure remained stable, and the patient will be admitted for ongoing evaluation and  management of uncontrolled diabetes with HHS and infected foot ulcer.  Review of Systems:  All other systems reviewed and apart from HPI, are negative.  Past Medical History:  Diagnosis Date  . Arthritis   . CKD (chronic kidney disease)   . Diabetes mellitus without complication ([place])   . Edema   . Hypercholesteremia   . Hypertension   . Neuropathy     Past Surgical History:  Procedure Laterality Date  . AMPUTATION Right 02/17/2015   Procedure: SECOND RAY AMPUTATION;  Surgeon: [name], MD;  Location: [place];  Service: Orthopedics;  Laterality: Right;  . AMPUTATION Left 10/04/2016   Procedure: GREAT TOE AMPUTATION GASTROCNEMIA RESECTION;  Surgeon: Newt Minion, MD;  Location: [place];  Service: Orthopedics;  Laterality: Left;  . AMPUTATION Left 12/16/2016   Procedure: LEFT FOOT 2ND TOE AMPUTATION;  Surgeon: Newt Minion, MD;  Location: [place];  Service: Orthopedics;  Laterality: Left;  . HIP SURGERY Right   . INCISION AND DRAINAGE HIP Right 11/12/2016   Procedure: IRRIGATION AND DEBRIDEMENT RIGHT HIP;  Surgeon: Newt Minion, MD;  Location: [place];  Service: Orthopedics;  Laterality: Right;     reports that he has been smoking cigarettes.  He has been smoking about 0.10 packs per day. He has never used smokeless tobacco. He reports that he does not drink alcohol or use drugs.  Allergies  Allergen Reactions  . Other Nausea And Vomiting    NeuRemedy - Dietary Supplement  . Vancomycin Other (See Comments)    Given after surgery in January 2018 at [facility] - shut kidneys down per pt  Family History  Problem Relation Age of Onset  . Cancer Mother   . Aneurysm Father        brain     Prior to Admission medications   Medication Sig Start Date End Date Taking? Authorizing Provider  ACCU-CHEK SOFTCLIX LANCETS lancets USE AS INSTRUCTED 11/18/17   Charlott Rakes, MD  atorvastatin (LIPITOR) 40 MG tablet Take 1 tablet (40 mg total) by mouth at  bedtime. 07/16/17   Charlott Rakes, MD  Blood Glucose Monitoring Suppl (ACCU-CHEK AVIVA PLUS) w/Device KIT Use as directed 07/16/17   Charlott Rakes, MD  cyclobenzaprine (FLEXERIL) 10 MG tablet TAKE 1 TABLET BY MOUTH 2 TIMES DAILY AS NEEDED FOR MUSCLE SPASMS. 01/07/18   Charlott Rakes, MD  DULoxetine (CYMBALTA) 60 MG capsule Take 1 capsule (60 mg total) by mouth daily. 07/16/17   Charlott Rakes, MD  gabapentin (NEURONTIN) 300 MG capsule Take 1 capsule (300 mg total) by mouth 2 (two) times daily. Stop once Lyrica arrives 07/16/17   Charlott Rakes, MD  glucose blood (ACCU-CHEK AVIVA PLUS) test strip Use as instructed 07/16/17   Charlott Rakes, MD  insulin aspart (NOVOLOG) 100 UNIT/ML FlexPen Inject 10 Units into the skin 3 (three) times daily with meals. 07/16/17   Charlott Rakes, MD  Insulin Glargine (LANTUS) 100 UNIT/ML Solostar Pen Inject 30 Units into the skin 2 (two) times daily. 07/16/17   Charlott Rakes, MD  Insulin Pen Needle (PEN NEEDLES) 31G X 5 MM MISC 10 Units by Does not apply route 3 (three) times daily. 09/29/17   Charlott Rakes, MD  Insulin Syringes, Disposable, U-100 1 ML MISC 1 each by Does not apply route 3 (three) times daily. 12/10/16   Charlott Rakes, MD  lisinopril (PRINIVIL,ZESTRIL) 5 MG tablet Take 1 tablet (5 mg total) by mouth at bedtime. 07/16/17   Charlott Rakes, MD  naproxen (NAPROSYN) 500 MG tablet Take 1 tablet (500 mg total) by mouth 2 (two) times daily with a meal. 07/16/17   Charlott Rakes, MD  polyethylene glycol powder (GLYCOLAX/MIRALAX) powder Take 17 g by mouth 2 (two) times daily as needed. 11/25/16   Charlott Rakes, MD  pregabalin (LYRICA) 100 MG capsule Take 1 capsule (100 mg total) by mouth 2 (two) times daily. 02/11/17   Argentina Donovan, PA-C  sildenafil (VIAGRA) 25 MG tablet Take 1 tablet (25 mg total) by mouth daily as needed for erectile dysfunction. 06/07/15   Tresa Garter, MD  tobramycin (TOBREX) 0.3 % ophthalmic solution Place 2 drops  into the right eye every 4 (four) hours. 02/15/17   Orlie Dakin, MD    Physical Exam: Vitals:   02/16/18 1930 02/16/18 2031 02/16/18 2100 02/16/18 2130  BP: 110/82 123/85 123/89 135/79  Pulse: (!) 121 (!) 113 (!) 109 (!) 102  Resp: 18 (!) _0 Temp: 99.9 F (37.7 C)     TempSrc: Oral     SpO2: 100% 98% 97% 96%  Weight:      Height:          Constitutional: NAD, calm  Eyes: PERTLA, lids and conjunctivae normal ENMT: Mucous membranes are moist. Posterior pharynx clear of any exudate or lesions.   Neck: normal, supple, no masses, no thyromegaly Respiratory: clear to auscultation bilaterally, no wheezing, no crackles. Normal respiratory effort.   Cardiovascular: Rate ~110 and regular. No extremity edema. No significant JVD. Abdomen: No distension, no tenderness, soft.  Musculoskeletal: no clubbing / cyanosis. Left foot s/p 1st and 2nd toe amputation with ulceration at 1st  toe base revealing underlying deep tissues; there is foul odor, scant drainage, and surrounding edema and tenderness.    Skin: no significant rashes, lesions, ulcers. Foot findings above. Poor turgor. Neurologic: CN 2-12 grossly intact. Sensation to light touch diminished in distal LE's bilaterally. Strength 5/5 in all 4 limbs.  Psychiatric: Alert and oriented x 3. Calm, cooperative.     Labs on Admission: I have personally reviewed following labs and imaging studies  CBC: Recent Labs  Lab 02/16/18 1853  WBC 10.2  NEUTROABS 7.5  HGB 13.6  HCT 41.9  MCV 85.2  PLT 295   Basic Metabolic Panel: Recent Labs  Lab 02/16/18 1853  NA 126*  K 5.0  CL 93*  CO2 21*  GLUCOSE 648*  BUN 21*  CREATININE 1.65*  CALCIUM 9.0   GFR: Estimated Creatinine Clearance: 74 mL/min (A) (by C-G formula based on SCr of 1.65 mg/dL (H)). Liver Function Tests: Recent Labs  Lab 02/16/18 1853  AST 27  ALT 28  ALKPHOS 142*  BILITOT 0.6  PROT 7.4  ALBUMIN 3.6   No results for input(s): LIPASE, AMYLASE in the  last 168 hours. No results for input(s): AMMONIA in the last 168 hours. Coagulation Profile: No results for input(s): INR, PROTIME in the last 168 hours. Cardiac Enzymes: No results for input(s): CKTOTAL, CKMB, CKMBINDEX, TROPONINI in the last 168 hours. BNP (last 3 results) No results for input(s): PROBNP in the last 8760 hours. HbA1C: No results for input(s): HGBA1C in the last 72 hours. CBG: No results for input(s): GLUCAP in the last 168 hours. Lipid Profile: No results for input(s): CHOL, HDL, LDLCALC, TRIG, CHOLHDL, LDLDIRECT in the last 72 hours. Thyroid Function Tests: No results for input(s): TSH, T4TOTAL, FREET4, T3FREE, THYROIDAB in the last 72 hours. Anemia Panel: No results for input(s): VITAMINB12, FOLATE, FERRITIN, TIBC, IRON, RETICCTPCT in the last 72 hours. Urine analysis:    Component Value Date/Time   COLORURINE YELLOW 11/28/2016 1531   APPEARANCEUR CLEAR 11/28/2016 1531   LABSPEC 1.016 11/28/2016 1531   PHURINE 5.0 11/28/2016 1531   GLUCOSEU 150 (A) 11/28/2016 1531   HGBUR NEGATIVE 11/28/2016 1531   BILIRUBINUR NEGATIVE 11/28/2016 1531   BILIRUBINUR neg 10/24/2014 1152   KETONESUR NEGATIVE 11/28/2016 1531   PROTEINUR NEGATIVE 11/28/2016 1531   UROBILINOGEN 0.2 03/30/2015 2059   NITRITE NEGATIVE 11/28/2016 1531   LEUKOCYTESUR NEGATIVE 11/28/2016 1531   Sepsis Labs: _0 (procalcitonin:4,lacticidven:4) )No results found for this or any previous visit (from the past 240 hour(s)).   Radiological Exams on Admission: Dg Foot Complete Left  Result Date: 02/16/2018 CLINICAL DATA:  Distal left foot wound located at site of toe amputations. EXAM: LEFT FOOT - COMPLETE 3+ VIEW COMPARISON:  11/27/2017 FINDINGS: Amputation of the first and second toes at the MTP joints with soft tissue ulceration involving the stump overlying the first metatarsal head. No frank bone destruction or bony sequestrum is identified. Chronic sclerosis of the third proximal phalanx is  stable and may reflect stigmata of chronic osteomyelitis or healed fracture. No new fracture or suspicious osseous lesions. IMPRESSION: 1. Amputation at the first and second metatarsal phalangeal joints are redemonstrated. 2. More prominent soft tissue ulceration involving the soft tissues overlying the first metatarsal head but without frank bone destruction nor cortical bone loss to suggest acute osteomyelitis. 3. Chronic sclerosis of the third proximal phalanx is unchanged and may reflect stigmata of chronic osteomyelitis or healed fracture. Electronically Signed   By: Ashley Royalty M.D.   On: 02/16/2018 19:49  EKG: Independently reviewed. Sinus tachycardia (rate 103).   Assessment/Plan  1. Diabetic foot ulcers with infection  - Patient has chronic foot ulcers bilaterally followed by ortho and status-post toe amputations, now p/w subjective fevers and increased pain, drainage, and foul odor from left foot wound   - He has hx of MRSA  - Blood cultures were collected in ED and he was started on empiric broad-spectrum abx  - Trend inflammatory markers, follow cultures, check radiographs, ABI, wound care consultation, continue empiric broad-spectrum abx    2. Uncontrolled IDDM with HHS  - A1c was 13.8% in December 2018 (most recent value available)   - Serum glucose is 648 on admission and he was treated with 10 units IV Novolog in ED  - Start insulin infusion with frequent CBG's and serial chem panels    3. Chronic kidney disease stage II-III  - SCr is 1.65 on admission, up from recent priors in 1.2-1.4 range  - He was given 2 liters NS in ED and is continued on IVF hydration  - Renally-dose medications, continue IVF hydration, repeat chem panel   4. Hyponatremia  - Serum sodium is 126 on admission in setting of marked hyperglycemia and hypovolemia  - Treated in ED with 10 units IV Novolog and 2 liters NS  - Continue IVF hydration and glycemic-control, continue serial chem panels   DVT  prophylaxis: SCD's  Code Status: Full  Family Communication: Discussed with patient  Consults called: None Admission status: Inpatient     Vianne Bulls, MD [facility] Pager [phone]  If 7PM-7AM, please contact night-coverage [email or web address] Password [email or web address]  02/16/2018, 10:10 PM

## 2018-02-16 NOTE — ED Provider Notes (Signed)
Patient placed in Quick Look pathway, seen and evaluated   Chief Complaint: left foot pain  HPI:   Chronic non healing wound s/p amputation 2018 here for worsening pain  ROS: positive: fevers, LLE swelling  Physical Exam:   Gen: No distress  Neuro: Awake and Alert  Skin: Warm    Focused Exam: chronic wound to left foot, hyperpigmented skin around edges. Malodorous.        Initiation of care has begun. The patient has been counseled on the process, plan, and necessity for staying for the completion/evaluation, and the remainder of the medical screening examination    Jerrell MylarGibbons, Darnesha Diloreto J, PA-C 02/16/18 1912    Terrilee FilesButler, Michael C, MD 02/18/18 [phone]

## 2018-02-16 NOTE — ED Triage Notes (Signed)
Pt endorses wound to right foot x 3-4 months, pt is DM and has been followed by foot dr. 100.1 oral temp, tachy at 122.

## 2018-02-16 NOTE — ED Provider Notes (Signed)
[place] EMERGENCY DEPARTMENT Provider Note   CSN: [id number] Arrival date & time: 02/16/18  1800     History   Chief Complaint Chief Complaint  Patient presents with  . Foot Pain  . Wound Infection    HPI Ryan Shaw is a 52 y.o. male.  HPI 52 year old male with past medical history as below including chronically poorly controlled diabetes with history of previous osteomyelitis status post amputation by Dr. Sharol Given, here with worsening left greater than right foot pain.  Patient states that over the last week, he has noticed that his chronic wound on his left great toe has become increasingly red and painful.  It has begun draining and has begun to have a foul smell.  He has had associated fever and chills and general fatigue.  He said nausea.  He describes an associated aching, throbbing, severe pain in his left foot.  He is also noticed some mild right great toe pain.  He has a history of amputation on the right second toe as well.  He has also had a mild cough.  No diarrhea.  Pain is worse with any movement and palpation.  Is having difficulty ambulating due to the pain.  Past Medical History:  Diagnosis Date  . Arthritis   . CKD (chronic kidney disease)   . Diabetes mellitus without complication ([place])   . Edema   . Hypercholesteremia   . Hypertension   . Neuropathy     Patient Active Problem List   Diagnosis Date Noted  . Hyponatremia 02/16/2018  . Chronic renal insufficiency 02/16/2018  . Diabetic foot infection ([place]) 02/16/2018  . Anxiety and depression 01/04/2017  . H/O amputation of lesser toe, left ([place]) 12/25/2016  . Hyperlipidemia 12/15/2016  . MRSA infection   . Abscess of bursa of right hip   . Cellulitis of left lower extremity   . Fever   . Right hip pain   . Acute right ankle pain   . Idiopathic chronic gout of left ankle without tophus   . Amputee, great toe, left ([place]) 10/08/2016  . Diabetic foot ulcer ([place]) 10/08/2016  .  Osteomyelitis due to type 2 diabetes mellitus ([place])   . Acquired contracture of Achilles tendon, left   . Essential hypertension 10/02/2016  . Bilateral chronic knee pain 07/09/2015  . Uncontrolled type 2 diabetes mellitus with hyperosmolar nonketotic hyperglycemia ([place]) 11/01/2014  . Diabetic peripheral neuropathy ([place]) 10/25/2014    Past Surgical History:  Procedure Laterality Date  . AMPUTATION Right 02/17/2015   Procedure: SECOND RAY AMPUTATION;  Surgeon: [name], MD;  Location: [facility];  Service: Orthopedics;  Laterality: Right;  . AMPUTATION Left 10/04/2016   Procedure: GREAT TOE AMPUTATION GASTROCNEMIA RESECTION;  Surgeon: Newt Minion, MD;  Location: [facility];  Service: Orthopedics;  Laterality: Left;  . AMPUTATION Left 12/16/2016   Procedure: LEFT FOOT 2ND TOE AMPUTATION;  Surgeon: Newt Minion, MD;  Location: [facility];  Service: Orthopedics;  Laterality: Left;  . HIP SURGERY Right   . INCISION AND DRAINAGE HIP Right 11/12/2016   Procedure: IRRIGATION AND DEBRIDEMENT RIGHT HIP;  Surgeon: Newt Minion, MD;  Location: [facility];  Service: Orthopedics;  Laterality: Right;        Home Medications    Prior to Admission medications   Medication Sig Start Date End Date Taking? Authorizing Provider  ACCU-CHEK SOFTCLIX LANCETS lancets USE AS INSTRUCTED 11/18/17  Yes Charlott Rakes, MD  atorvastatin (LIPITOR) 40 MG tablet Take 1  tablet (40 mg total) by mouth at bedtime. 07/16/17  Yes Charlott Rakes, MD  Blood Glucose Monitoring Suppl (ACCU-CHEK AVIVA PLUS) w/Device KIT Use as directed 07/16/17  Yes Newlin, Enobong, MD  cyclobenzaprine (FLEXERIL) 10 MG tablet TAKE 1 TABLET BY MOUTH 2 TIMES DAILY AS NEEDED FOR MUSCLE SPASMS. 01/07/18  Yes Newlin, Enobong, MD  DULoxetine (CYMBALTA) 60 MG capsule Take 1 capsule (60 mg total) by mouth daily. 07/16/17  Yes Newlin, Charlane Ferretti, MD  glucose blood (ACCU-CHEK AVIVA PLUS) test strip Use as instructed 07/16/17  Yes Newlin, Enobong, MD  insulin  aspart (NOVOLOG) 100 UNIT/ML FlexPen Inject 10 Units into the skin 3 (three) times daily with meals. 07/16/17  Yes Charlott Rakes, MD  Insulin Glargine (LANTUS) 100 UNIT/ML Solostar Pen Inject 30 Units into the skin 2 (two) times daily. Patient taking differently: Inject 20 Units into the skin 2 (two) times daily.  07/16/17  Yes Newlin, Charlane Ferretti, MD  Insulin Pen Needle (PEN NEEDLES) 31G X 5 MM MISC 10 Units by Does not apply route 3 (three) times daily. 09/29/17  Yes Charlott Rakes, MD  Insulin Syringes, Disposable, U-100 1 ML MISC 1 each by Does not apply route 3 (three) times daily. 12/10/16  Yes Charlott Rakes, MD  lisinopril (PRINIVIL,ZESTRIL) 5 MG tablet Take 1 tablet (5 mg total) by mouth at bedtime. 07/16/17  Yes Charlott Rakes, MD  naproxen (NAPROSYN) 500 MG tablet Take 1 tablet (500 mg total) by mouth 2 (two) times daily with a meal. Patient taking differently: Take 500 mg by mouth 2 (two) times daily as needed for mild pain.  07/16/17  Yes Newlin, Charlane Ferretti, MD  polyethylene glycol powder (GLYCOLAX/MIRALAX) powder Take 17 g by mouth 2 (two) times daily as needed. Patient taking differently: Take 17 g by mouth 2 (two) times daily as needed for mild constipation.  11/25/16  Yes Charlott Rakes, MD  sildenafil (VIAGRA) 25 MG tablet Take 1 tablet (25 mg total) by mouth daily as needed for erectile dysfunction. 06/07/15  Yes Tresa Garter, MD  gabapentin (NEURONTIN) 300 MG capsule Take 1 capsule (300 mg total) by mouth 2 (two) times daily. Stop once Lyrica arrives Patient not taking: Reported on 02/16/2018 07/16/17   Charlott Rakes, MD  pregabalin (LYRICA) 100 MG capsule Take 1 capsule (100 mg total) by mouth 2 (two) times daily. Patient not taking: Reported on 02/16/2018 02/11/17   Argentina Donovan, PA-C    Family History Family History  Problem Relation Age of Onset  . Cancer Mother   . Aneurysm Father        brain    Social History Social History   Tobacco Use  . Smoking  status: Current Every Day Smoker    Packs/day: 0.10    Types: Cigarettes  . Smokeless tobacco: Never Used  . Tobacco comment: 3 cigs daily  Substance Use Topics  . Alcohol use: No    Comment: patient states he quit drinking since he has been sick  . Drug use: No    Comment: 2 weeks     Allergies   Other and Vancomycin   Review of Systems Review of Systems  Constitutional: Positive for chills, fatigue and fever.  HENT: Negative for congestion and rhinorrhea.   Eyes: Negative for visual disturbance.  Respiratory: Negative for cough, shortness of breath and wheezing.   Cardiovascular: Negative for chest pain and leg swelling.  Gastrointestinal: Positive for nausea. Negative for abdominal pain, diarrhea and vomiting.  Genitourinary: Negative for dysuria and flank pain.  Musculoskeletal: Positive for gait problem. Negative for neck pain and neck stiffness.  Skin: Positive for wound. Negative for rash.  Allergic/Immunologic: Negative for immunocompromised state.  Neurological: Positive for weakness. Negative for syncope and headaches.  All other systems reviewed and are negative.    Physical Exam Updated Vital Signs BP 125/82   Pulse (!) 101   Temp 99.9 F (37.7 C) (Oral)   Resp 20   Ht _0  (1.981 m)   Wt 106.1 kg (233 lb 14.4 oz)   SpO2 97%   BMI 27.03 kg/m   Physical Exam  Constitutional: He is oriented to person, place, and time. He appears well-developed and well-nourished. He appears ill. No distress.  HENT:  Head: Normocephalic and atraumatic.  Dry MM  Eyes: Conjunctivae are normal.  Neck: Neck supple.  Cardiovascular: Regular rhythm and normal heart sounds. Tachycardia present. Exam reveals no friction rub.  No murmur heard. Tachycardic  Pulmonary/Chest: Effort normal and breath sounds normal. Tachypnea noted. No respiratory distress. He has no wheezes. He has no rales.  Abdominal: Soft. He exhibits no distension.  Musculoskeletal: He exhibits no edema.    Neurological: He is alert and oriented to person, place, and time. He exhibits normal muscle tone.  Skin: Skin is warm. Capillary refill takes less than 2 seconds.  Psychiatric: He has a normal mood and affect.  Nursing note and vitals reviewed.   LOWER EXTREMITY EXAM: BILATERAL  INSPECTION & PALPATION: Open, foul smelling wound and ulceration to left first toe amputation site, with moderate surrounding warmth and edema. Warmth extends above the ankle. TTP over right dorsum of foot, no wounds appreciated.  SENSORY: sensation is intact to light touch in:  Superficial peroneal nerve distribution (over dorsum of foot) Deep peroneal nerve distribution (over first dorsal web space) Sural nerve distribution (over lateral aspect 5th metatarsal) Saphenous nerve distribution (over medial instep)  MOTOR:  + Motor EHL (great toe dorsiflexion) + FHL (great toe plantar flexion)  + TA (ankle dorsiflexion)  + GSC (ankle plantar flexion)  VASCULAR: 1+ dorsalis pedis and posterior tibialis pulses  COMPARTMENTS: Soft, warm, well-perfused No pain with passive extension No parethesias    ED Treatments / Results  Labs (all labs ordered are listed, but only abnormal results are displayed) Labs Reviewed  COMPREHENSIVE METABOLIC PANEL - Abnormal; Notable for the following components:      Result Value   Sodium 126 (*)    Chloride 93 (*)    CO2 21 (*)    Glucose, Bld 648 (*)    BUN 21 (*)    Creatinine, Ser 1.65 (*)    Alkaline Phosphatase 142 (*)    GFR calc non Af Amer 47 (*)    GFR calc Af Amer 54 (*)    All other components within normal limits  URINALYSIS, ROUTINE W REFLEX MICROSCOPIC - Abnormal; Notable for the following components:   Color, Urine STRAW (*)    Glucose, UA >=500 (*)    Hgb urine dipstick SMALL (*)    All other components within normal limits  SEDIMENTATION RATE - Abnormal; Notable for the following components:   Sed Rate 43 (*)    All other components within  normal limits  C-REACTIVE PROTEIN - Abnormal; Notable for the following components:   CRP 5.9 (*)    All other components within normal limits  HEMOGLOBIN A1C - Abnormal; Notable for the following components:   Hgb A1c MFr Bld 10.8 (*)    All other components within normal  limits  GLUCOSE, CAPILLARY - Abnormal; Notable for the following components:   Glucose-Capillary 314 (*)    All other components within normal limits  I-STAT CG4 LACTIC ACID, ED - Abnormal; Notable for the following components:   Lactic Acid, Venous 2.60 (*)    All other components within normal limits  CBG MONITORING, ED - Abnormal; Notable for the following components:   Glucose-Capillary 291 (*)    All other components within normal limits  CULTURE, BLOOD (ROUTINE X 2)  CULTURE, BLOOD (ROUTINE X 2)  SURGICAL PCR SCREEN  CBC WITH DIFFERENTIAL/PLATELET  PREALBUMIN  HIV ANTIBODY (ROUTINE TESTING)  BASIC METABOLIC PANEL  BASIC METABOLIC PANEL  C-REACTIVE PROTEIN  SEDIMENTATION RATE  BASIC METABOLIC PANEL  BASIC METABOLIC PANEL  I-STAT CG4 LACTIC ACID, ED    EKG None  Radiology Dg Chest 2 View  Result Date: 02/16/2018 CLINICAL DATA:  Initial evaluation for acute cough, sepsis. EXAM: CHEST - 2 VIEW COMPARISON:  Prior radiograph from 11/28/2016. FINDINGS: Cardiac and mediastinal silhouette stable in size and contour, and remain within normal limits. Lungs hypoinflated. Hazy bibasilar opacities favored to reflect atelectasis and/or bronchovascular crowding, although possible mild infiltrates not excluded. No pulmonary edema or pleural effusion. No pneumothorax. No acute osseous abnormality. IMPRESSION: Low lung volumes with hazy bibasilar opacities. Atelectasis/bronchovascular crowding is favored. Possible infiltrates not excluded, and could be considered in the correct clinical setting. Electronically Signed   By: Jeannine Boga M.D.   On: 02/16/2018 22:13   Dg Foot Complete Left  Result Date:  02/16/2018 CLINICAL DATA:  Distal left foot wound located at site of toe amputations. EXAM: LEFT FOOT - COMPLETE 3+ VIEW COMPARISON:  11/27/2017 FINDINGS: Amputation of the first and second toes at the MTP joints with soft tissue ulceration involving the stump overlying the first metatarsal head. No frank bone destruction or bony sequestrum is identified. Chronic sclerosis of the third proximal phalanx is stable and may reflect stigmata of chronic osteomyelitis or healed fracture. No new fracture or suspicious osseous lesions. IMPRESSION: 1. Amputation at the first and second metatarsal phalangeal joints are redemonstrated. 2. More prominent soft tissue ulceration involving the soft tissues overlying the first metatarsal head but without frank bone destruction nor cortical bone loss to suggest acute osteomyelitis. 3. Chronic sclerosis of the third proximal phalanx is unchanged and may reflect stigmata of chronic osteomyelitis or healed fracture. Electronically Signed   By: Ashley Royalty M.D.   On: 02/16/2018 19:49   Dg Foot Complete Right  Result Date: 02/16/2018 CLINICAL DATA:  Initial evaluation for wound to right foot. EXAM: RIGHT FOOT COMPLETE - 3+ VIEW COMPARISON:  Prior radiograph from 10/27/2016 FINDINGS: Patient status post amputation of the right second ray. No acute fracture or dislocation. No evidence for osteomyelitis. Calcaneal enthesophyte noted. Degenerative changes at the first MTP joint. Pes planus foot deformity. No soft tissue abnormality. IMPRESSION: 1. No acute osseous abnormality about the foot. 2. Prior amputation at the right second ray. Electronically Signed   By: Jeannine Boga M.D.   On: 02/16/2018 22:15    Procedures .Critical Care Performed by: Duffy Bruce, MD Authorized by: Duffy Bruce, MD   Critical care provider statement:    Critical care time (minutes):  35   Critical care time was exclusive of:  Separately billable procedures and treating other patients  and teaching time   Critical care was necessary to treat or prevent imminent or life-threatening deterioration of the following conditions:  Sepsis, cardiac failure, circulatory failure, dehydration and metabolic  crisis   Critical care was time spent personally by me on the following activities:  Development of treatment plan with patient or surrogate, discussions with consultants, evaluation of patient's response to treatment, examination of patient, obtaining history from patient or surrogate, ordering and performing treatments and interventions, ordering and review of laboratory studies, ordering and review of radiographic studies, pulse oximetry, re-evaluation of patient's condition and review of old charts   I assumed direction of critical care for this patient from another provider in my specialty: no     (including critical care time)  Medications Ordered in ED Medications  atorvastatin (LIPITOR) tablet 40 mg (has no administration in time range)  cyclobenzaprine (FLEXERIL) tablet 10 mg (10 mg Oral Given 02/16/18 2334)  DULoxetine (CYMBALTA) DR capsule 60 mg (has no administration in time range)  0.9 %  sodium chloride infusion ( Intravenous New Bag/Given 02/16/18 2317)  dextrose 5 %-0.45 % sodium chloride infusion (has no administration in time range)  insulin regular (NOVOLIN R,HUMULIN R) 100 Units in sodium chloride 0.9 % 100 mL (1 Units/mL) infusion (2.5 Units/hr Intravenous New Bag/Given 02/17/18 0029)  sodium chloride flush (NS) 0.9 % injection 3 mL (3 mLs Intravenous Not Given 02/16/18 2324)  acetaminophen (TYLENOL) tablet 650 mg (has no administration in time range)    Or  acetaminophen (TYLENOL) suppository 650 mg (has no administration in time range)  HYDROcodone-acetaminophen (NORCO/VICODIN) 5-325 MG per tablet 1-2 tablet (1 tablet Oral Given 02/16/18 2342)  ondansetron (ZOFRAN) tablet 4 mg (has no administration in time range)    Or  ondansetron (ZOFRAN) injection 4 mg (has no  administration in time range)  cefTRIAXone (ROCEPHIN) 2 g in sodium chloride 0.9 % 100 mL IVPB (2 g Intravenous New Bag/Given 02/17/18 0022)  metroNIDAZOLE (FLAGYL) IVPB 500 mg (has no administration in time range)  vancomycin (VANCOCIN) 2,000 mg in sodium chloride 0.9 % 500 mL IVPB (has no administration in time range)  vancomycin (VANCOCIN) IVPB 750 mg/150 ml premix (has no administration in time range)  acetaminophen (TYLENOL) tablet 650 mg (650 mg Oral Given 02/16/18 1915)  sodium chloride 0.9 % bolus 1,000 mL (0 mLs Intravenous Stopped 02/16/18 2140)  sodium chloride 0.9 % bolus 1,000 mL (0 mLs Intravenous Stopped 02/16/18 2336)  insulin aspart (novoLOG) injection 10 Units (10 Units Intravenous Given 02/16/18 2124)  HYDROmorphone (DILAUDID) injection 1 mg (1 mg Intravenous Given 02/16/18 2124)  piperacillin-tazobactam (ZOSYN) IVPB 3.375 g (0 g Intravenous Stopped 02/16/18 2336)     Initial Impression / Assessment and Plan / ED Course  I have reviewed the triage vital signs and the nursing notes.  Pertinent labs & imaging results that were available during my care of the patient were reviewed by me and considered in my medical decision making (see chart for details).     52 yo M here with fever, tachycardia, leukocytosis, and CC of worsening foot pain. Exam, history is c/f sepsis 2/2 diabetic foot ulceration with superimposed cellulitis, possible osteo. No signs of nec fasc. Pt activated as sepsis protocol. No hypotension, LA>4 or signs of shock. 2L given, will be cautious in setting of poor perfusion, suspected underlying vascular disease. Will admit to step down. Sees Dr. Sharol Given, will likely need evaluation in AM.  Final Clinical Impressions(s) / ED Diagnoses   Final diagnoses:  Sepsis due to skin infection [facility])  Diabetic foot infection [facility])  Hyperglycemia    ED Discharge Orders    None       Duffy Bruce, MD 02/17/18  0038

## 2018-02-16 NOTE — Progress Notes (Addendum)
Pharmacy Antibiotic Note  Ria ClockWilber Amiri is a 52 y.o. male admitted on 02/16/2018 with fever/chills, foot pain and drainage. Starting empiric antibiotics for diabetic foot ulcer. SCr 1.6.   Plan: -Vancomycin 2 g IV x1 then 750 mg IV q12h -Monitor renal fx, cultures, VT at Css -Ceftriaxone 2 g IV q24h   Height: 6\' 6"  (198.1 cm) Weight: 242 lb (109.8 kg) IBW/kg (Calculated) : 91.4  Temp (24hrs), Avg:100 F (37.8 C), Min:99.9 F (37.7 C), Max:100.1 F (37.8 C)  Recent Labs  Lab 02/16/18 1853 02/16/18 1901 02/16/18 2139  WBC 10.2  --   --   CREATININE 1.65*  --   --   LATICACIDVEN  --  2.60* 1.67     Antimicrobials this admission: 7/17 vancomycin > 7/17 ceftriaxone > 7/17 daptomycin x1  Dose adjustments this admission: N/A  Microbiology results: 7/17 blood cx:  Baldemar FridayMasters, Moshe Wenger M 02/16/2018 10:31 PM

## 2018-02-17 ENCOUNTER — Inpatient Hospital Stay (HOSPITAL_COMMUNITY): Payer: Medicaid Other

## 2018-02-17 ENCOUNTER — Other Ambulatory Visit: Payer: Self-pay

## 2018-02-17 ENCOUNTER — Encounter (HOSPITAL_COMMUNITY): Payer: Self-pay | Admitting: General Practice

## 2018-02-17 DIAGNOSIS — L0390 Cellulitis, unspecified: Secondary | ICD-10-CM

## 2018-02-17 DIAGNOSIS — E11621 Type 2 diabetes mellitus with foot ulcer: Principal | ICD-10-CM

## 2018-02-17 DIAGNOSIS — E11628 Type 2 diabetes mellitus with other skin complications: Secondary | ICD-10-CM

## 2018-02-17 DIAGNOSIS — L089 Local infection of the skin and subcutaneous tissue, unspecified: Secondary | ICD-10-CM

## 2018-02-17 DIAGNOSIS — E871 Hypo-osmolality and hyponatremia: Secondary | ICD-10-CM

## 2018-02-17 DIAGNOSIS — L97521 Non-pressure chronic ulcer of other part of left foot limited to breakdown of skin: Secondary | ICD-10-CM

## 2018-02-17 DIAGNOSIS — E1100 Type 2 diabetes mellitus with hyperosmolarity without nonketotic hyperglycemic-hyperosmolar coma (NKHHC): Secondary | ICD-10-CM

## 2018-02-17 DIAGNOSIS — N183 Chronic kidney disease, stage 3 (moderate): Secondary | ICD-10-CM

## 2018-02-17 LAB — HIV ANTIBODY (ROUTINE TESTING W REFLEX): HIV SCREEN 4TH GENERATION: NONREACTIVE

## 2018-02-17 LAB — BASIC METABOLIC PANEL
Anion gap: 7 (ref 5–15)
BUN: 17 mg/dL (ref 6–20)
CHLORIDE: 104 mmol/L (ref 98–111)
CO2: 27 mmol/L (ref 22–32)
Calcium: 8.7 mg/dL — ABNORMAL LOW (ref 8.9–10.3)
Creatinine, Ser: 1.35 mg/dL — ABNORMAL HIGH (ref 0.61–1.24)
GFR calc Af Amer: >60 mL/min (ref >60)
GFR, EST NON AFRICAN AMERICAN: 59 mL/min — AB (ref >60)
GLUCOSE: 222 mg/dL — AB (ref 70–99)
Potassium: 4.1 mmol/L (ref 3.5–5.1)
SODIUM: 138 mmol/L (ref 135–145)

## 2018-02-17 LAB — SEDIMENTATION RATE: Sed Rate: 42 mm/hr — ABNORMAL HIGH (ref 0–16)

## 2018-02-17 LAB — GLUCOSE, CAPILLARY
GLUCOSE-CAPILLARY: 216 mg/dL — AB (ref 70–99)
GLUCOSE-CAPILLARY: 249 mg/dL — AB (ref 70–99)
Glucose-Capillary: 233 mg/dL — ABNORMAL HIGH (ref 70–99)
Glucose-Capillary: 92 mg/dL (ref 70–99)
Glucose-Capillary: 112 mg/dL — ABNORMAL HIGH (ref 70–99)
Glucose-Capillary: 182 mg/dL — ABNORMAL HIGH (ref 70–99)

## 2018-02-17 LAB — SURGICAL PCR SCREEN
MRSA, PCR: NEGATIVE
STAPHYLOCOCCUS AUREUS: NEGATIVE

## 2018-02-17 LAB — C-REACTIVE PROTEIN: CRP: 5.8 mg/dL — AB (ref <1.0)

## 2018-02-17 MED ORDER — INSULIN GLARGINE 100 UNIT/ML ~~LOC~~ SOLN
20.0000 [IU] | Freq: Every day | SUBCUTANEOUS | Status: DC
  Administered 2018-02-17 – 2018-02-21 (×5): 20 [IU] via SUBCUTANEOUS
  Filled 2018-02-17 (×5): qty 0.2

## 2018-02-17 MED ORDER — INSULIN ASPART 100 UNIT/ML ~~LOC~~ SOLN
5.0000 [IU] | Freq: Once | SUBCUTANEOUS | Status: AC
  Administered 2018-02-17: 5 [IU] via SUBCUTANEOUS

## 2018-02-17 MED ORDER — HYDROMORPHONE HCL 1 MG/ML IJ SOLN
1.0000 mg | INTRAMUSCULAR | Status: DC | PRN
  Administered 2018-02-20 – 2018-02-21 (×5): 1 mg via INTRAVENOUS
  Filled 2018-02-17 (×5): qty 1

## 2018-02-17 MED ORDER — INSULIN ASPART 100 UNIT/ML ~~LOC~~ SOLN
0.0000 [IU] | Freq: Three times a day (TID) | SUBCUTANEOUS | Status: DC
  Administered 2018-02-17: 5 [IU] via SUBCUTANEOUS
  Administered 2018-02-18: 3 [IU] via SUBCUTANEOUS
  Administered 2018-02-18: 8 [IU] via SUBCUTANEOUS
  Administered 2018-02-18: 5 [IU] via SUBCUTANEOUS
  Administered 2018-02-19: 3 [IU] via SUBCUTANEOUS
  Administered 2018-02-19: 2 [IU] via SUBCUTANEOUS
  Administered 2018-02-19: 8 [IU] via SUBCUTANEOUS
  Administered 2018-02-20: 2 [IU] via SUBCUTANEOUS
  Administered 2018-02-20 – 2018-02-21 (×2): 5 [IU] via SUBCUTANEOUS
  Administered 2018-02-21: 3 [IU] via SUBCUTANEOUS
  Administered 2018-02-22: 2 [IU] via SUBCUTANEOUS

## 2018-02-17 MED ORDER — ENOXAPARIN SODIUM 40 MG/0.4ML ~~LOC~~ SOLN
40.0000 mg | SUBCUTANEOUS | Status: DC
  Administered 2018-02-17 – 2018-02-21 (×5): 40 mg via SUBCUTANEOUS
  Filled 2018-02-17 (×5): qty 0.4

## 2018-02-17 NOTE — Progress Notes (Signed)
Initial Nutrition Assessment  DOCUMENTATION CODES:   Not applicable  INTERVENTION:    Advance diet as medically appropriate   RD to add supplements when/as able  NUTRITION DIAGNOSIS:   Increased nutrient needs related to wound healing as evidenced by estimated needs  GOAL:   Patient will meet greater than or equal to 90% of their needs  MONITOR:   PO intake, Supplement acceptance, Labs, Skin, Weight trends, I & O's  REASON FOR ASSESSMENT:   Consult Wound healing  ASSESSMENT:   52 y.o. Male with PMH for uncontrolled insulin-dependent DM, CKD and chronic foot ulcers s/p toe amputations; presented to ER for evaluation of subjective fevers, chills, and increasing pain, swelling and drainage from his left foot wound.  Pt admitted with diabetic foot infection.  RD unable to obtain nutrition hx.  Pt is out of his room in [facility] upon visit. He has been NPO since admission. Labs & medications reviewed. CBG's [id number].  NUTRITION - FOCUSED PHYSICAL EXAM:  Unable to complete at this time.  Diet Order:   Diet Order           Diet Carb Modified Fluid consistency: Thin; Room service appropriate? Yes  Diet effective now         EDUCATION NEEDS:   Not appropriate for education at this time  Skin:  Skin Assessment: Skin Integrity Issues: Skin Integrity Issues:: Other (Comment) Other: L foot wound  Last BM:  7/16  Height:   Ht Readings from Last 1 Encounters:  02/16/18 6\' 6"  (1.981 m)   Weight:   Wt Readings from Last 1 Encounters:  02/17/18 232 lb 2.3 oz (105.3 kg)   BMI:  Body mass index is 26.83 kg/m.  Estimated Nutritional Needs:   Kcal:  2300-2500  Protein:  125-140 gm  Fluid:  2.3-2.5 L  Maureen ChattersKatie Kauri Garson, RD, LDN Pager #: [phone] After-Hours Pager #: [phone]

## 2018-02-17 NOTE — Progress Notes (Signed)
Spoke with patient about his diabetes. States that he was diagnosed about 4 years ago.  [facility] Gets his insulin and blood glucose meter strips there. States that he takes Lantus 20 units BID and Novolog 10 units TID at home, but states that he does not take it everyday. Does not check blood sugars everyday. Lives with daughter.  Reviewed importance of checking blood sugars daily and taking insulin as prescribed daily. Hgba1C is 10.8% which = around 270 mg/dl blood sugar on an average. Foot care very important to help prevent further infections. 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   Will continue to monitor blood sugars while in the hospital. [facility].   Smith MinceKendra Quince Santana RN BSN CDE Diabetes Coordinator Pager: [phone]  8am-5pm

## 2018-02-17 NOTE — Progress Notes (Signed)
Norlene Campbell, MD   Jacqualine Code, PA-C [address], [place], [place]  [address]                             [phone]   ORTHOPAEDIC CONSULTATION  Treyven Lafauci            MRN:  [id number] DOB/SEX:  1965-08-07/male    REQUESTING PHYSICIAN:  Blake Divine  CHIEF COMPLAINT:  Diabetic ulcer left foot  HISTORY: Dellas Johnsonis a 52 y.o. male with history of diabetes complicated by peripheral neuropathy and vascular disease. Has had prior left great and second toe amputation about a year ago with subsequent breakdown of incisional area at the great toe. Has been followed as reAson as 1 month ago ny Dr Lajoyce Corners in the office for local debridement. Admitted last night with possible sepsis. Films yesterday did not demonstrate obvious osteomyelitis.   PAST MEDICAL HISTORY: Patient Active Problem List   Diagnosis Date Noted  . Hyponatremia 02/16/2018  . Chronic renal insufficiency 02/16/2018  . Diabetic foot infection (HCC) 02/16/2018  . Anxiety and depression 01/04/2017  . H/O amputation of lesser toe, left (HCC) 12/25/2016  . Hyperlipidemia 12/15/2016  . MRSA infection   . Abscess of bursa of right hip   . Cellulitis of left lower extremity   . Fever   . Right hip pain   . Acute right ankle pain   . Idiopathic chronic gout of left ankle without tophus   . Amputee, great toe, left (HCC) 10/08/2016  . Diabetic foot ulcer (HCC) 10/08/2016  . 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 Acquired contracture of Achilles tendon, left   . Essential hypertension 10/02/2016  . Bilateral chronic knee pain 07/09/2015  . Uncontrolled type 2 diabetes mellitus with hyperosmolar nonketotic hyperglycemia (HCC) 11/01/2014  . Diabetic peripheral neuropathy (HCC) 10/25/2014   Past Medical History:  Diagnosis Date  . Arthritis   . CKD (chronic kidney disease)   . Diabetes mellitus without complication (HCC)   . Edema   . Hypercholesteremia   . Hypertension   . Neuropathy   .  Ulcer of foot due to diabetes (HCC) 01/2018   left foot   Past Surgical History:  Procedure Laterality Date  . AMPUTATION Right 02/17/2015   Procedure: SECOND RAY AMPUTATION;  Surgeon: Kathryne Hitch, MD;  Location: [facility] OR;  Service: Orthopedics;  Laterality: Right;  . AMPUTATION Left 10/04/2016   Procedure: GREAT TOE AMPUTATION GASTROCNEMIA RESECTION;  Surgeon: Nadara Mustard, MD;  Location: [facility] OR;  Service: Orthopedics;  Laterality: Left;  . AMPUTATION Left 12/16/2016   Procedure: LEFT FOOT 2ND TOE AMPUTATION;  Surgeon: Nadara Mustard, MD;  Location: [facility] OR;  Service: Orthopedics;  Laterality: Left;  . HIP SURGERY Right   . INCISION AND DRAINAGE HIP Right 11/12/2016   Procedure: IRRIGATION AND DEBRIDEMENT RIGHT HIP;  Surgeon: Nadara Mustard, MD;  Location: [facility] OR;  Service: Orthopedics;  Laterality: Right;    MEDICATIONS:   Current Facility-Administered Medications:  .  0.9 %  sodium chloride infusion, , Intravenous, Continuous, Opyd, Lavone Neri, MD, Last Rate: 100 mL/hr at 02/17/18 0505 .  acetaminophen (TYLENOL) tablet 650 mg, 650 mg, Oral, Q6H PRN **OR** acetaminophen (TYLENOL) suppository 650 mg, 650 mg, Rectal, Q6H PRN, Opyd, Lavone Neri, MD .  atorvastatin (LIPITOR) tablet 40 mg, 40 mg, Oral, q1800, Opyd, Timothy S, MD .  cefTRIAXone (ROCEPHIN) 2 g in sodium chloride 0.9 %  100 mL IVPB, 2 g, Intravenous, Q24H, Opyd, Lavone Neriimothy S, MD, Last Rate: 200 mL/hr at 02/17/18 0022, 2 g at 02/17/18 0022 .  cyclobenzaprine (FLEXERIL) tablet 10 mg, 10 mg, Oral, BID, Opyd, Lavone Neriimothy S, MD, 10 mg at 02/17/18 1007 .  dextrose 5 %-0.45 % sodium chloride infusion, , Intravenous, Continuous, Opyd, Timothy S, MD .  DULoxetine (CYMBALTA) DR capsule 60 mg, 60 mg, Oral, Daily, Opyd, Lavone Neriimothy S, MD, 60 mg at 02/17/18 1007 .  HYDROcodone-acetaminophen (NORCO/VICODIN) 5-325 MG per tablet 1-2 tablet, 1-2 tablet, Oral, Q4H PRN, Opyd, Lavone Neriimothy S, MD, 2 tablet at 02/17/18 0457 .  insulin aspart (novoLOG) injection 0-15  Units, 0-15 Units, Subcutaneous, TID WC, Schorr, Katherine P, NP .  insulin glargine (LANTUS) injection 20 Units, 20 Units, Subcutaneous, QHS, Schorr, Roma KayserKatherine P, NP .  metroNIDAZOLE (FLAGYL) IVPB 500 mg, 500 mg, Intravenous, Q8H, Opyd, Timothy S, MD, Last Rate: 100 mL/hr at 02/17/18 1009, 500 mg at 02/17/18 1009 .  ondansetron (ZOFRAN) tablet 4 mg, 4 mg, Oral, Q6H PRN **OR** ondansetron (ZOFRAN) injection 4 mg, 4 mg, Intravenous, Q6H PRN, Opyd, Timothy S, MD .  sodium chloride flush (NS) 0.9 % injection 3 mL, 3 mL, Intravenous, Q12H, Opyd, Timothy S, MD .  vancomycin (VANCOCIN) 2,000 mg in sodium chloride 0.9 % 500 mL IVPB, 2,000 mg, Intravenous, Once, Opyd, Lavone Neriimothy S, MD .  Melene Muller[START ON 02/18/2018] vancomycin (VANCOCIN) IVPB 750 mg/150 ml premix, 750 mg, Intravenous, Q12H, Opyd, Lavone Neriimothy S, MD  ALLERGIES:   Allergies  Allergen Reactions  . Other Nausea And Vomiting    NeuRemedy - Dietary Supplement  . Vancomycin Other (See Comments)    Given [facility] - shut kidneys down per pt    REVIEW OF SYSTEMS: REVIEWED IN DETAIL IN CHART  FAMILY HISTORY:   Family History  Problem Relation Age of Onset  . Cancer Mother   . Aneurysm Father        brain    SOCIAL HISTORY:   reports that he has been smoking cigarettes.  He has been smoking about 0.10 packs per day. He has never used smokeless tobacco. He reports that he does not drink alcohol or use drugs.   EXAMINATION: Vital signs in last 24 hours: Temp:  [97.8 F (36.6 C)-100.1 F (37.8 C)] 97.8 F (36.6 C) (07/18 0810) Pulse Rate:  [87-122] 87 (07/18 0810) Resp:  [13-31] 15 (07/18 0810) BP: (97-143)/(68-92) 97/68 (07/18 0810) SpO2:  [96 %-100 %] 100 % (07/18 0810) Weight:  [232 lb 2.3 oz (105.3 kg)-242 lb (109.8 kg)] 232 lb 2.3 oz (105.3 kg) (07/18 0500)    Musculoskeletal Exam  :left foot with open ulceration several cms in diameter at levekl of great toe amputation. No active  drainage. Foot not erythematous. No ascending lymphangitis. Good dorsal pulse but no sensastion from mid foot distally. No other areas of ulceration.   DIAGNOSTIC STUDIES: Recent laboratory studies: Recent Labs    02/16/18 1853  WBC 10.2  HGB 13.6  HCT 41.9  PLT 319   Recent Labs    02/16/18 1853 02/17/18 0233  NA 126* 138  K 5.0 4.1  CL 93* 104  CO2 21* 27  BUN 21* 17  CREATININE 1.65* 1.35*  GLUCOSE 648* 222*  CALCIUM 9.0 8.7*   Lab Results  Component Value Date   INR 1.07 11/09/2016     Recent Radiographic Studies :  Dg Chest 2 View  Result Date: 02/16/2018 CLINICAL DATA:  Initial evaluation for acute cough, sepsis. EXAM: CHEST - 2 VIEW COMPARISON:  Prior radiograph from 11/28/2016. FINDINGS: Cardiac and mediastinal silhouette stable in size and contour, and remain within normal limits. Lungs hypoinflated. Hazy bibasilar opacities favored to reflect atelectasis and/or bronchovascular crowding, although possible mild infiltrates not excluded. No pulmonary edema or pleural effusion. No pneumothorax. No acute osseous abnormality. IMPRESSION: Low lung volumes with hazy bibasilar opacities. Atelectasis/bronchovascular crowding is favored. Possible infiltrates not excluded, and could be considered in the correct clinical setting. Electronically Signed   By: Rise Mu M.D.   On: 02/16/2018 22:13   Dg Foot Complete Left  Result Date: 02/16/2018 CLINICAL DATA:  Distal left foot wound located at site of toe amputations. EXAM: LEFT FOOT - COMPLETE 3+ VIEW COMPARISON:  11/27/2017 FINDINGS: Amputation of the first and second toes at the MTP joints with soft tissue ulceration involving the stump overlying the first metatarsal head. No frank bone destruction or bony sequestrum is identified. Chronic sclerosis of the third proximal phalanx is stable and may reflect stigmata of chronic osteomyelitis or healed fracture. No new fracture or suspicious osseous lesions. IMPRESSION: 1.  Amputation at the first and second metatarsal phalangeal joints are redemonstrated. 2. More prominent soft tissue ulceration involving the soft tissues overlying the first metatarsal head but without frank bone destruction nor cortical bone loss to suggest acute osteomyelitis. 3. Chronic sclerosis of the third proximal phalanx is unchanged and may reflect stigmata of chronic osteomyelitis or healed fracture. Electronically Signed   By: Tollie Eth M.D.   On: 02/16/2018 19:49   Dg Foot Complete Right  Result Date: 02/16/2018 CLINICAL DATA:  Initial evaluation for wound to right foot. EXAM: RIGHT FOOT COMPLETE - 3+ VIEW COMPARISON:  Prior radiograph from 10/27/2016 FINDINGS: Patient status post amputation of the right second ray. No acute fracture or dislocation. No evidence for osteomyelitis. Calcaneal enthesophyte noted. Degenerative changes at the first MTP joint. Pes planus foot deformity. No soft tissue abnormality. IMPRESSION: 1. No acute osseous abnormality about the foot. 2. Prior amputation at the right second ray. Electronically Signed   By: Rise Mu M.D.   On: 02/16/2018 22:15    ASSESSMENT: diabetic peripheral neuropathy and vascular disease with chronic ulceration of distal foot. Will most likely require further amputation. MRI to r/o possible chronic osteo and extent.   PLAN: MRI left foot Valeria Batman 02/17/2018, 12:31 PM Patient ID: Ria Clock, male   DOB: 08/13/1965, 52 y.o.   MRN: [id number]

## 2018-02-17 NOTE — Progress Notes (Signed)
PROGRESS NOTE    Isamu Trammel  VQQ:[id number] DOB: 1966-06-27 DOA: 02/16/2018 PCP: Hoy Register, MD    Brief Narrative: Longino Trefz is a 52 y.o. male with medical history significant for uncontrolled insulin-dependent diabetes mellitus, chronic kidney disease stage II-III, and chronic foot ulcers status post toe amputations followed by orthopedic surgery, and now presenting to the emergency department for evaluation of subjective fevers, chills, and increasing pain, swelling, drainage, and odor from his left foot wound    Assessment & Plan:   Principal Problem:   Diabetic foot infection (HCC) Active Problems:   Uncontrolled type 2 diabetes mellitus with hyperosmolar nonketotic hyperglycemia (HCC)   Diabetic foot ulcer (HCC)   Hyponatremia   Chronic renal insufficiency   Worsening diabetic foot ulcer:  Admitted for IV ANTIBIOTICS with vancomycin, rocephin and flagyl.  X rays do not show any osteomyelitis.  MRI of foot ordered  Orthopedics consulted and plan for MRI .  Pain control .    Uncontrolled DM with hyperglycemia: A1c is 10.9.  CBG (last 3)  Recent Labs    02/17/18 0503 02/17/18 0754 02/17/18 1316  GLUCAP 216* 92 112*   Resume SSI for now.    Hyponatremia:  Possibly from dehydration.      Acute on Stage 3 CKD:  Creatinine improved with hydration.  Hold nephrotoxins.  Monitor renal parameters tomorrow.       DVT prophylaxis: lovenox.  Code Status: full code.  Family Communication: none at bedside.  Disposition Plan: pending work up by orthopedics   Consultants:   Orthopedics Dr Cleophas Dunker.    Procedures:MRI FOOT PENDING.    Antimicrobials: vancomycin and rocephin and flagyl since admission.    Subjective: Pain not well controlled.   Objective: Vitals:   02/17/18 0353 02/17/18 0500 02/17/18 0810 02/17/18 1324  BP: 109/73  97/68   Pulse: 87  87   Resp: 15  15   Temp:   97.8 F (36.6 C) 97.6 F (36.4 C)  TempSrc:   Oral  Oral  SpO2: 98%  100%   Weight:  105.3 kg (232 lb 2.3 oz)    Height:        Intake/Output Summary (Last 24 hours) at 02/17/2018 1347 Last data filed at 02/17/2018 1325 Gross per 24 hour  Intake 628.09 ml  Output 500 ml  Net 128.09 ml   Filed Weights   02/16/18 1846 02/16/18 2325 02/17/18 0500  Weight: 109.8 kg (242 lb) 106.1 kg (233 lb 14.4 oz) 105.3 kg (232 lb 2.3 oz)    Examination:  General exam: Appears calm and comfortable  Respiratory system: Clear to auscultation. Respiratory effort normal. Cardiovascular system: S1 & S2 heard, RRR. No JVD, Gastrointestinal system: Abdomen is nondistended, soft and nontender. No organomegaly or masses felt. Normal bowel sounds heard. Central nervous system: Alert and oriented. No focal neurological deficits. Extremities: left foot ulcer with 4 cm diameters at the great toe amputation area.some drainage, areas of tenderness.  Skin: No rashes, lesions or ulcers Psychiatry:. Mood & affect appropriate.     Data Reviewed: I have personally reviewed following labs and imaging studies  CBC: Recent Labs  Lab 02/16/18 1853  WBC 10.2  NEUTROABS 7.5  HGB 13.6  HCT 41.9  MCV 85.2  PLT 319   Basic Metabolic Panel: Recent Labs  Lab 02/16/18 1853 02/17/18 0233  NA 126* 138  K 5.0 4.1  CL 93* 104  CO2 21* 27  GLUCOSE 648* 222*  BUN 21* 17  CREATININE 1.65* 1.35*  CALCIUM 9.0 8.7*   GFR: Estimated Creatinine Clearance: 83.7 mL/min (A) (by C-G formula based on SCr of 1.35 mg/dL (H)). Liver Function Tests: Recent Labs  Lab 02/16/18 1853  AST 27  ALT 28  ALKPHOS 142*  BILITOT 0.6  PROT 7.4  ALBUMIN 3.6   No results for input(s): LIPASE, AMYLASE in the last 168 hours. No results for input(s): AMMONIA in the last 168 hours. Coagulation Profile: No results for input(s): INR, PROTIME in the last 168 hours. Cardiac Enzymes: No results for input(s): CKTOTAL, CKMB, CKMBINDEX, TROPONINI in the last 168 hours. BNP (last 3  results) No results for input(s): PROBNP in the last 8760 hours. HbA1C: Recent Labs    02/16/18 2122  HGBA1C 10.8*   CBG: Recent Labs  Lab 02/16/18 2347 02/17/18 0300 02/17/18 0503 02/17/18 0754 02/17/18 1316  GLUCAP 314* 233* 216* 92 112*   Lipid Profile: No results for input(s): CHOL, HDL, LDLCALC, TRIG, CHOLHDL, LDLDIRECT in the last 72 hours. Thyroid Function Tests: No results for input(s): TSH, T4TOTAL, FREET4, T3FREE, THYROIDAB in the last 72 hours. Anemia Panel: No results for input(s): VITAMINB12, FOLATE, FERRITIN, TIBC, IRON, RETICCTPCT in the last 72 hours. Sepsis Labs: Recent Labs  Lab 02/16/18 1901 02/16/18 2139  LATICACIDVEN 2.60* 1.67    Recent Results (from the past 240 hour(s))  Surgical pcr screen     Status: None   Collection Time: 02/17/18 12:35 AM  Result Value Ref Range Status   MRSA, PCR NEGATIVE NEGATIVE Final   Staphylococcus aureus NEGATIVE NEGATIVE Final    Comment: (NOTE) The Xpert SA Assay (FDA approved for NASAL specimens in patients 52 years of age and older), is one component of a comprehensive surveillance program. It is not intended to diagnose infection nor to guide or monitor treatment. Performed at [facility], [address] [place], [place] [address]          Radiology Studies: Dg Chest 2 View  Result Date: 02/16/2018 CLINICAL DATA:  Initial evaluation for acute cough, sepsis. EXAM: CHEST - 2 VIEW COMPARISON:  Prior radiograph from 11/28/2016. FINDINGS: Cardiac and mediastinal silhouette stable in size and contour, and remain within normal limits. Lungs hypoinflated. Hazy bibasilar opacities favored to reflect atelectasis and/or bronchovascular crowding, although possible mild infiltrates not excluded. No pulmonary edema or pleural effusion. No pneumothorax. No acute osseous abnormality. IMPRESSION: Low lung volumes with hazy bibasilar opacities. Atelectasis/bronchovascular crowding is favored. Possible infiltrates  not excluded, and could be considered in the correct clinical setting. Electronically Signed   By: Rise MuBenjamin  McClintock M.D.   On: 02/16/2018 22:13   Dg Foot Complete Left  Result Date: 02/16/2018 CLINICAL DATA:  Distal left foot wound located at site of toe amputations. EXAM: LEFT FOOT - COMPLETE 3+ VIEW COMPARISON:  11/27/2017 FINDINGS: Amputation of the first and second toes at the MTP joints with soft tissue ulceration involving the stump overlying the first metatarsal head. No frank bone destruction or bony sequestrum is identified. Chronic sclerosis of the third proximal phalanx is stable and may reflect stigmata of chronic osteomyelitis or healed fracture. No new fracture or suspicious osseous lesions. IMPRESSION: 1. Amputation at the first and second metatarsal phalangeal joints are redemonstrated. 2. More prominent soft tissue ulceration involving the soft tissues overlying the first metatarsal head but without frank bone destruction nor cortical bone loss to suggest acute osteomyelitis. 3. Chronic sclerosis of the third proximal phalanx is unchanged and may reflect stigmata of chronic osteomyelitis or healed fracture. Electronically Signed  By: Tollie Eth M.D.   On: 02/16/2018 19:49   Dg Foot Complete Right  Result Date: 02/16/2018 CLINICAL DATA:  Initial evaluation for wound to right foot. EXAM: RIGHT FOOT COMPLETE - 3+ VIEW COMPARISON:  Prior radiograph from 10/27/2016 FINDINGS: Patient status post amputation of the right second ray. No acute fracture or dislocation. No evidence for osteomyelitis. Calcaneal enthesophyte noted. Degenerative changes at the first MTP joint. Pes planus foot deformity. No soft tissue abnormality. IMPRESSION: 1. No acute osseous abnormality about the foot. 2. Prior amputation at the right second ray. Electronically Signed   By: Rise Mu M.D.   On: 02/16/2018 22:15        Scheduled Meds: . atorvastatin  40 mg Oral q1800  . cyclobenzaprine  10 mg  Oral BID  . DULoxetine  60 mg Oral Daily  . insulin aspart  0-15 Units Subcutaneous TID WC  . insulin glargine  20 Units Subcutaneous QHS  . sodium chloride flush  3 mL Intravenous Q12H   Continuous Infusions: . sodium chloride 100 mL/hr at 02/17/18 0505  . cefTRIAXone (ROCEPHIN)  IV 2 g (02/17/18 0022)  . dextrose 5 % and 0.45% NaCl    . metronidazole 500 mg (02/17/18 1009)  . vancomycin    . [START ON 02/18/2018] vancomycin       LOS: 1 day    Time spent: 35 minutes.     Kathlen Mody, MD [facility] Pager [phone]   If 7PM-7AM, please contact night-coverage [email or web address] Password [email or web address] 02/17/2018, 1:47 PM

## 2018-02-17 NOTE — Care Management Note (Addendum)
Case Management Note  Patient Details  Name: Ryan Shaw MRN: [id number] Date of Birth: 11/20/1965  Subjective/Objective:             Admitted with diabetic foot ulcer. Hx: diabetes mellitus, chronic kidney disease stage II-III,and chronic foot ulcers status post toe amputations. Resides with daughter, [name]. PTA independent with ADL's, DME: cane  [name] (Daughter) Tilford PillarBrenda Reilly (ex-wife)    [phone]           - s/p Left transmetatarsal amputation,02/20/2018     PCP:Dr. E.Newlin/[facility]  Action/Plan: MR L foot pending... ortho following. NCM monitoring for disposition needs   [facility] , appointment 03/16/2018 with PCP.  Expected Discharge Date:                Expected Discharge Plan:     In-House Referral:     Discharge planning Services  CM Consult  Post Acute Care Choice:    Choice offered to:     DME Arranged:    rolling walker DME Agency:   [facility]  HH Arranged:    n/a HH Agency:   n/a  Status of Service:   completed  If discussed at Long Length of Stay Meetings, dates discussed:    Additional Comments:  Epifanio LeschesCole, Myeasha Ballowe Hudson, RN 02/17/2018, 6:24 PM

## 2018-02-17 NOTE — Consult Note (Signed)
WOC Nurse wound consult note Reason for Consult: left amputation site full thickness infected wound Wound type: surgical,  Pressure Injury POA: NA Measurement: 3cm x 2.4cm x 0.2cm Wound bed:100 % yellow slough Drainage (amount, consistency, odor) copious brown exudate, malodorous Periwound:wound edges are rolled, skin surrounding area is darkened and dried Dressing procedure/placement/frequency: This is a patient of Dr. Lajoyce Cornersuda, note mentions he will be consulted this am. Pt has been kept NPO presumably if procedure is needed.  I will provide conservative wound care orders while awaiting Ortho Consult. We will not follow, but will remain available to this patient, to nursing, and the medical and/or surgical teams.  Please re-consult if we need to assist further.  Barnett HatterMelinda Jessenya Berdan, RN-C, WTA-C, O[facility] Wound Treatment Associate Ostomy Care Associate

## 2018-02-17 NOTE — Progress Notes (Signed)
VASCULAR LAB PRELIMINARY  ARTERIAL  ABI completed:    RIGHT    LEFT    PRESSURE WAVEFORM  PRESSURE WAVEFORM  BRACHIAL NA Triphasic BRACHIAL 100 Triphasic  DP 124 Biphasic DP 121 Triphasic  AT   AT    PT 116 Triphasic PT 104 Biphasic  PER   PER    GREAT TOE 84 Normal 3RD TOE 56 Normal    RIGHT LEFT  ABI 1.24 1.21     HONGYING  Rakiya Krawczyk, RVT 02/17/2018, 12:25 PM

## 2018-02-18 LAB — CBC
HCT: 35.4 % — ABNORMAL LOW (ref 39.0–52.0)
Hemoglobin: 11.7 g/dL — ABNORMAL LOW (ref 13.0–17.0)
MCH: 27.9 pg (ref 26.0–34.0)
MCHC: 33.1 g/dL (ref 30.0–36.0)
MCV: 84.3 fL (ref 78.0–100.0)
Platelets: 231 10*3/uL (ref 150–400)
RBC: 4.2 MIL/uL — AB (ref 4.22–5.81)
RDW: 12.4 % (ref 11.5–15.5)
WBC: 7.5 10*3/uL (ref 4.0–10.5)

## 2018-02-18 LAB — BASIC METABOLIC PANEL
ANION GAP: 6 (ref 5–15)
BUN: 11 mg/dL (ref 6–20)
CO2: 27 mmol/L (ref 22–32)
CREATININE: 1.06 mg/dL (ref 0.61–1.24)
Calcium: 8.4 mg/dL — ABNORMAL LOW (ref 8.9–10.3)
Chloride: 103 mmol/L (ref 98–111)
GFR calc non Af Amer: >60 mL/min (ref >60)
Glucose, Bld: 232 mg/dL — ABNORMAL HIGH (ref 70–99)
Potassium: 4.6 mmol/L (ref 3.5–5.1)
SODIUM: 136 mmol/L (ref 135–145)

## 2018-02-18 LAB — GLUCOSE, CAPILLARY
GLUCOSE-CAPILLARY: 195 mg/dL — AB (ref 70–99)
GLUCOSE-CAPILLARY: 221 mg/dL — AB (ref 70–99)
Glucose-Capillary: 279 mg/dL — ABNORMAL HIGH (ref 70–99)
Glucose-Capillary: 208 mg/dL — ABNORMAL HIGH (ref 70–99)

## 2018-02-18 LAB — C-REACTIVE PROTEIN: CRP: 6.5 mg/dL — AB (ref <1.0)

## 2018-02-18 MED ORDER — GABAPENTIN 600 MG PO TABS
300.0000 mg | ORAL_TABLET | Freq: Two times a day (BID) | ORAL | Status: DC
  Administered 2018-02-18 – 2018-02-22 (×9): 300 mg via ORAL
  Filled 2018-02-18 (×9): qty 1

## 2018-02-18 MED ORDER — INSULIN GLARGINE 100 UNIT/ML ~~LOC~~ SOLN
10.0000 [IU] | Freq: Every day | SUBCUTANEOUS | Status: DC
  Administered 2018-02-18 – 2018-02-19 (×2): 10 [IU] via SUBCUTANEOUS
  Filled 2018-02-18 (×2): qty 0.1

## 2018-02-18 MED ORDER — INSULIN ASPART 100 UNIT/ML ~~LOC~~ SOLN
0.0000 [IU] | Freq: Every day | SUBCUTANEOUS | Status: DC
  Administered 2018-02-18: 2 [IU] via SUBCUTANEOUS
  Administered 2018-02-19: 3 [IU] via SUBCUTANEOUS
  Administered 2018-02-20 – 2018-02-21 (×2): 2 [IU] via SUBCUTANEOUS

## 2018-02-18 NOTE — Progress Notes (Signed)
PROGRESS NOTE    Ryan Shaw  UJW:[id number] DOB: 10/07/1965 DOA: 02/16/2018 PCP: Ryan RegisterNewlin, Enobong, MD    Brief Narrative: Ryan Shaw is a 52 y.o. male with medical history significant for uncontrolled insulin-dependent diabetes mellitus, chronic kidney disease stage II-III, and chronic foot ulcers status post toe amputations followed by orthopedic surgery, and now presenting to the emergency department for evaluation of subjective fevers, chills, and increasing pain, swelling, drainage, and odor from his left foot wound    Assessment & Plan:   Principal Problem:   Diabetic foot infection (HCC) Active Problems:   Uncontrolled type 2 diabetes mellitus with hyperosmolar nonketotic hyperglycemia (HCC)   Diabetic foot ulcer (HCC)   Hyponatremia   Chronic renal insufficiency   Worsening diabetic foot ulcer:  Admitted for IV ANTIBIOTICS with vancomycin, rocephin and flagyl.  X rays do not show any osteomyelitis.  MRI of foot ordered , pending.  Orthopedics consulted and plan for MRI .  Pain control .  ABI wnl.  Blood cultures have been negative so far.  Wound care consulted and recommendations given.  Lactic acid normalized.   Uncontrolled DM with hyperglycemia: A1c is 10.9.  CBG (last 3)  Recent Labs    02/17/18 1635 02/17/18 2138 02/18/18 0803  GLUCAP 249* 182* 195*   Resume SSI for now. Patient is on Lantus 20 units BID, but he is non compliant,.  Will add Lantus 10 units q AM along with 20 units at bedtime.     Hyponatremia:  Possibly from dehydration. Resolved with hydration.      Acute on Stage 3 CKD:  Suspect from dehydration and possible ATN from infection.  Creatinine improved with hydration. Avoid NSAIDS, ( he was on naproxen at home).  Hold nephrotoxins. Pt on Lisinopril, which was held on admission.     Mild normocytic anemia:  Hemoglobin at 11.7. It appears this is his baseline.   Hypertension:  Well controlled. On lisinopril at home ,  which was held on admission for AKI, plan to resume it in 1 to 2 days, if creatinine stays stable.    Hyperlipidemia:  Resume lipitor. Outpatient follow up with lipid panel.    Diabetic neuropathy:  Stable. On gabapentin at home which will be resumed.     DVT prophylaxis: lovenox.  Code Status: full code.  Family Communication: none at bedside.  Disposition Plan: pending work up by orthopedics   Consultants:   Orthopedics Dr Cleophas DunkerWhitfield.    Procedures: MRI FOOT PENDING.    Antimicrobials: vancomycin and rocephin and flagyl since admission.    Subjective: Pain better today. Overall feeling better.   Objective: Vitals:   02/17/18 2138 02/18/18 0153 02/18/18 0500 02/18/18 0528  BP: 103/72 110/72  123/72  Pulse: 88 86  76  Resp: 15 17  18   Temp: 97.9 F (36.6 C) 98.3 F (36.8 C)  97.7 F (36.5 C)  TempSrc: Oral Oral  Oral  SpO2: 99% 98%  100%  Weight:   108.7 kg (239 lb 11.2 oz)   Height:        Intake/Output Summary (Last 24 hours) at 02/18/2018 0859 Last data filed at 02/17/2018 1325 Gross per 24 hour  Intake -  Output 500 ml  Net -500 ml   Filed Weights   02/16/18 2325 02/17/18 0500 02/18/18 0500  Weight: 106.1 kg (233 lb 14.4 oz) 105.3 kg (232 lb 2.3 oz) 108.7 kg (239 lb 11.2 oz)    Examination:  General exam: Appears calm and comfortable not in distress.  Respiratory system: Clear to auscultation. Respiratory effort normal. No wheezing or rhonchi.  Cardiovascular system: S1 & S2 heard, RRR. No JVD, Gastrointestinal system: Abdomen is soft NT nd bs+ Central nervous system: Alert and oriented. No focal neurological deficits. Extremities: left foot ulcer with 4 cm diameters at the great toe amputation area.some drainage, areas of tenderness. Its wrapped up.  Skin: No rashes, lesions or ulcers Psychiatry:. Mood & affect appropriate.     Data Reviewed: I have personally reviewed following labs and imaging studies  CBC: Recent Labs  Lab  02/16/18 1853 02/18/18 0505  WBC 10.2 7.5  NEUTROABS 7.5  --   HGB 13.6 11.7*  HCT 41.9 35.4*  MCV 85.2 84.3  PLT 319 231   Basic Metabolic Panel: Recent Labs  Lab 02/16/18 1853 02/17/18 0233 02/18/18 0505  NA 126* 138 136  K 5.0 4.1 4.6  CL 93* 104 103  CO2 21* 27 27  GLUCOSE 648* 222* 232*  BUN 21* 17 11  CREATININE 1.65* 1.35* 1.06  CALCIUM 9.0 8.7* 8.4*   GFR: Estimated Creatinine Clearance: 106.6 mL/min (by C-G formula based on SCr of 1.06 mg/dL). Liver Function Tests: Recent Labs  Lab 02/16/18 1853  AST 27  ALT 28  ALKPHOS 142*  BILITOT 0.6  PROT 7.4  ALBUMIN 3.6   No results for input(s): LIPASE, AMYLASE in the last 168 hours. No results for input(s): AMMONIA in the last 168 hours. Coagulation Profile: No results for input(s): INR, PROTIME in the last 168 hours. Cardiac Enzymes: No results for input(s): CKTOTAL, CKMB, CKMBINDEX, TROPONINI in the last 168 hours. BNP (last 3 results) No results for input(s): PROBNP in the last 8760 hours. HbA1C: Recent Labs    02/16/18 2122  HGBA1C 10.8*   CBG: Recent Labs  Lab 02/17/18 0754 02/17/18 1316 02/17/18 1635 02/17/18 2138 02/18/18 0803  GLUCAP 92 112* 249* 182* 195*   Lipid Profile: No results for input(s): CHOL, HDL, LDLCALC, TRIG, CHOLHDL, LDLDIRECT in the last 72 hours. Thyroid Function Tests: No results for input(s): TSH, T4TOTAL, FREET4, T3FREE, THYROIDAB in the last 72 hours. Anemia Panel: No results for input(s): VITAMINB12, FOLATE, FERRITIN, TIBC, IRON, RETICCTPCT in the last 72 hours. Sepsis Labs: Recent Labs  Lab 02/16/18 1901 02/16/18 2139  LATICACIDVEN 2.60* 1.67    Recent Results (from the past 240 hour(s))  Blood culture (routine x 2)     Status: None (Preliminary result)   Collection Time: 02/16/18  8:22 PM  Result Value Ref Range Status   Specimen Description BLOOD RIGHT ANTECUBITAL  Final   Special Requests   Final    BOTTLES DRAWN AEROBIC AND ANAEROBIC Blood Culture  adequate volume   Culture   Final    NO GROWTH 2 DAYS Performed at [facility], [address] [place], [place] [address]    Report Status PENDING  Incomplete  Blood culture (routine x 2)     Status: None (Preliminary result)   Collection Time: 02/16/18  8:25 PM  Result Value Ref Range Status   Specimen Description BLOOD RIGHT UPPER ARM  Final   Special Requests   Final    BOTTLES DRAWN AEROBIC AND ANAEROBIC Blood Culture adequate volume   Culture   Final    NO GROWTH 2 DAYS Performed at [facility], [address] [place], [place] [address]    Report Status PENDING  Incomplete  Surgical pcr screen     Status: None   Collection Time: 02/17/18 12:35 AM  Result Value Ref Range Status   MRSA, PCR NEGATIVE NEGATIVE Final   Staphylococcus aureus NEGATIVE NEGATIVE Final    Comment: (NOTE) The Xpert SA Assay (FDA approved for NASAL specimens in patients 69 years of age and older), is one component of a comprehensive surveillance program. It is not intended to diagnose infection nor to guide or monitor treatment. Performed at [facility], [address], [place], [place] [address]          Radiology Studies: Dg Chest 2 View  Result Date: 02/16/2018 CLINICAL DATA:  Initial evaluation for acute cough, sepsis. EXAM: CHEST - 2 VIEW COMPARISON:  Prior radiograph from 11/28/2016. FINDINGS: Cardiac and mediastinal silhouette stable in size and contour, and remain within normal limits. Lungs hypoinflated. Hazy bibasilar opacities favored to reflect atelectasis and/or bronchovascular crowding, although possible mild infiltrates not excluded. No pulmonary edema or pleural effusion. No pneumothorax. No acute osseous abnormality. IMPRESSION: Low lung volumes with hazy bibasilar opacities. Atelectasis/bronchovascular crowding is favored. Possible infiltrates not excluded, and could be considered in the correct clinical setting. Electronically Signed   By: Rise Mu  M.D.   On: 02/16/2018 22:13   Dg Foot Complete Left  Result Date: 02/16/2018 CLINICAL DATA:  Distal left foot wound located at site of toe amputations. EXAM: LEFT FOOT - COMPLETE 3+ VIEW COMPARISON:  11/27/2017 FINDINGS: Amputation of the first and second toes at the MTP joints with soft tissue ulceration involving the stump overlying the first metatarsal head. No frank bone destruction or bony sequestrum is identified. Chronic sclerosis of the third proximal phalanx is stable and may reflect stigmata of chronic osteomyelitis or healed fracture. No new fracture or suspicious osseous lesions. IMPRESSION: 1. Amputation at the first and second metatarsal phalangeal joints are redemonstrated. 2. More prominent soft tissue ulceration involving the soft tissues overlying the first metatarsal head but without frank bone destruction nor cortical bone loss to suggest acute osteomyelitis. 3. Chronic sclerosis of the third proximal phalanx is unchanged and may reflect stigmata of chronic osteomyelitis or healed fracture. Electronically Signed   By: Tollie Eth M.D.   On: 02/16/2018 19:49   Dg Foot Complete Right  Result Date: 02/16/2018 CLINICAL DATA:  Initial evaluation for wound to right foot. EXAM: RIGHT FOOT COMPLETE - 3+ VIEW COMPARISON:  Prior radiograph from 10/27/2016 FINDINGS: Patient status post amputation of the right second ray. No acute fracture or dislocation. No evidence for osteomyelitis. Calcaneal enthesophyte noted. Degenerative changes at the first MTP joint. Pes planus foot deformity. No soft tissue abnormality. IMPRESSION: 1. No acute osseous abnormality about the foot. 2. Prior amputation at the right second ray. Electronically Signed   By: Rise Mu M.D.   On: 02/16/2018 22:15        Scheduled Meds: . atorvastatin  40 mg Oral q1800  . cyclobenzaprine  10 mg Oral BID  . DULoxetine  60 mg Oral Daily  . enoxaparin (LOVENOX) injection  40 mg Subcutaneous Q24H  . insulin aspart   0-15 Units Subcutaneous TID WC  . insulin glargine  20 Units Subcutaneous QHS  . sodium chloride flush  3 mL Intravenous Q12H   Continuous Infusions: . sodium chloride 100 mL/hr at 02/17/18 0505  . cefTRIAXone (ROCEPHIN)  IV 2 g (02/18/18 0007)  . metronidazole 500 mg (02/18/18 0110)  . vancomycin       LOS: 2 days    Time spent: 35 minutes.     Kathlen Mody, MD [facility] Pager [phone]  If 7PM-7AM, please contact night-coverage [email or web address] Password [email or web address] 02/18/2018, 8:59 AM

## 2018-02-19 ENCOUNTER — Inpatient Hospital Stay (HOSPITAL_COMMUNITY): Payer: Medicaid Other

## 2018-02-19 LAB — GLUCOSE, CAPILLARY
GLUCOSE-CAPILLARY: 168 mg/dL — AB (ref 70–99)
GLUCOSE-CAPILLARY: 268 mg/dL — AB (ref 70–99)
Glucose-Capillary: 143 mg/dL — ABNORMAL HIGH (ref 70–99)
Glucose-Capillary: 278 mg/dL — ABNORMAL HIGH (ref 70–99)

## 2018-02-19 LAB — BASIC METABOLIC PANEL
ANION GAP: 5 (ref 5–15)
BUN: 11 mg/dL (ref 6–20)
CO2: 29 mmol/L (ref 22–32)
Calcium: 8.6 mg/dL — ABNORMAL LOW (ref 8.9–10.3)
Chloride: 103 mmol/L (ref 98–111)
Creatinine, Ser: 1.13 mg/dL (ref 0.61–1.24)
GLUCOSE: 216 mg/dL — AB (ref 70–99)
Potassium: 4.5 mmol/L (ref 3.5–5.1)
Sodium: 137 mmol/L (ref 135–145)

## 2018-02-19 LAB — MRSA PCR SCREENING: MRSA BY PCR: NEGATIVE

## 2018-02-19 LAB — C-REACTIVE PROTEIN: CRP: 4.8 mg/dL — AB (ref <1.0)

## 2018-02-19 MED ORDER — INSULIN GLARGINE 100 UNIT/ML ~~LOC~~ SOLN
20.0000 [IU] | Freq: Every day | SUBCUTANEOUS | Status: DC
  Administered 2018-02-20 – 2018-02-22 (×3): 20 [IU] via SUBCUTANEOUS
  Filled 2018-02-19 (×4): qty 0.2

## 2018-02-19 MED ORDER — GADOBENATE DIMEGLUMINE 529 MG/ML IV SOLN
20.0000 mL | Freq: Once | INTRAVENOUS | Status: AC | PRN
  Administered 2018-02-19: 20 mL via INTRAVENOUS

## 2018-02-19 MED ORDER — INSULIN GLARGINE 100 UNIT/ML ~~LOC~~ SOLN
10.0000 [IU] | Freq: Once | SUBCUTANEOUS | Status: AC
  Administered 2018-02-19: 10 [IU] via SUBCUTANEOUS
  Filled 2018-02-19: qty 0.1

## 2018-02-19 NOTE — Progress Notes (Signed)
PROGRESS NOTE    Aasir Daigler  AVW:[id number] DOB: 11-14-1965 DOA: 02/16/2018 PCP: Hoy Register, MD    Brief Narrative: Ryan Shaw is a 52 y.o. male with medical history significant for uncontrolled insulin-dependent diabetes mellitus, chronic kidney disease stage II-III, and chronic foot ulcers status post toe amputations followed by orthopedic surgery, and now presenting to the emergency department for evaluation of subjective fevers, chills, and increasing pain, swelling, drainage, and odor from his left foot wound    Assessment & Plan:   Principal Problem:   Diabetic foot infection (HCC) Active Problems:   Diabetic peripheral neuropathy (HCC)   Uncontrolled type 2 diabetes mellitus with hyperosmolar nonketotic hyperglycemia (HCC)   Essential hypertension   Amputee, great toe, left (HCC)   Diabetic foot ulcer (HCC)   Hyperlipidemia   Hyponatremia   Chronic renal insufficiency   Worsening diabetic foot ulcer:  Admitted for IV ANTIBIOTICS with vancomycin, rocephin and flagyl. Continue the same.  X rays do not show any osteomyelitis.  MRI of foot ordered , still pending.  Orthopedics consulted and plan for MRI and plan for OR in am. NPO after midnight.   Pain control .  ABI wnl.  Blood cultures have been negative so far.  Wound care consulted and recommendations given.  Lactic acid normalized.   Uncontrolled DM with hyperglycemia: A1c is 10.9.  CBG (last 3)  Recent Labs    02/18/18 1149 02/18/18 1743 02/18/18 2148  GLUCAP 221* 279* 208*   Resume SSI for now. Patient is on Lantus 20 units BID at home, but he is non compliant,.  Will add Lantus 10 units q AM along with 20 units at bedtime.  Increase the am LANTUS to 20 units BID.  Resume SSI.     Hyponatremia:  Possibly from dehydration. Resolved with hydration.      Acute on Stage 3 CKD:  Suspect from dehydration and possible ATN from infection.  Creatinine improved with hydration. Avoid NSAIDS, (  he was on naproxen at home).  Hold nephrotoxins. Pt on Lisinopril, which was held on admission.     Mild normocytic anemia:  Hemoglobin at 11.7. It appears this is his baseline.   Hypertension:  Well controlled. On lisinopril at home , which was held on admission for AKI, plan to resume it in 1 to 2 days, if creatinine stays stable.    Hyperlipidemia:  Resume lipitor. Outpatient follow up with lipid panel.    Diabetic neuropathy:  Stable. On gabapentin at home which was resumed.     DVT prophylaxis: lovenox.  Code Status: full code.  Family Communication: none at bedside.  Disposition Plan: pending work up by orthopedics , possible OR in am.   Consultants:   Orthopedics Dr Cleophas Dunker.    Procedures: MRI FOOT PENDING.    Antimicrobials: vancomycin and rocephin and flagyl since admission.    Subjective: Pain better today. No new complaints.   Objective: Vitals:   02/18/18 0745 02/18/18 1347 02/18/18 2150 02/19/18 0650  BP: 119/78 111/70 119/71 119/78  Pulse:  80 87 81  Resp: 16 17 18 18   Temp: 98 F (36.7 C) 97.7 F (36.5 C) 98.4 F (36.9 C) 98.2 F (36.8 C)  TempSrc: Oral Oral Oral Oral  SpO2: 99% 100% 99% 100%  Weight:    109.7 kg (241 lb 13.5 oz)  Height:        Intake/Output Summary (Last 24 hours) at 02/19/2018 1120 Last data filed at 02/19/2018 0942 Gross per 24 hour  Intake 3  ml  Output -  Net 3 ml   Filed Weights   02/17/18 0500 02/18/18 0500 02/19/18 0650  Weight: 105.3 kg (232 lb 2.3 oz) 108.7 kg (239 lb 11.2 oz) 109.7 kg (241 lb 13.5 oz)    Examination:  General exam: comfortable, not in distress.  Respiratory system: air entry fair , no wheezing or rhonchi.  Cardiovascular system: S1 & S2 heard, RRR. No JVD, Gastrointestinal system: Abdomen is soft non tender non distended  Bowel sounds heard.  Central nervous system: Alert and oriented. Non focal.  Extremities: left foot ulcer  at the great toe amputation area.some drainage, areas of  tenderness. Skin: No rashes, lesions or ulcers Psychiatry:. Mood & affect appropriate.     Data Reviewed: I have personally reviewed following labs and imaging studies  CBC: Recent Labs  Lab 02/16/18 1853 02/18/18 0505  WBC 10.2 7.5  NEUTROABS 7.5  --   HGB 13.6 11.7*  HCT 41.9 35.4*  MCV 85.2 84.3  PLT 319 231   Basic Metabolic Panel: Recent Labs  Lab 02/16/18 1853 02/17/18 0233 02/18/18 0505 02/19/18 0514  NA 126* 138 136 137  K 5.0 4.1 4.6 4.5  CL 93* 104 103 103  CO2 21* 27 27 29   GLUCOSE 648* 222* 232* 216*  BUN 21* 17 11 11   CREATININE 1.65* 1.35* 1.06 1.13  CALCIUM 9.0 8.7* 8.4* 8.6*   GFR: Estimated Creatinine Clearance: 108 mL/min (by C-G formula based on SCr of 1.13 mg/dL). Liver Function Tests: Recent Labs  Lab 02/16/18 1853  AST 27  ALT 28  ALKPHOS 142*  BILITOT 0.6  PROT 7.4  ALBUMIN 3.6   No results for input(s): LIPASE, AMYLASE in the last 168 hours. No results for input(s): AMMONIA in the last 168 hours. Coagulation Profile: No results for input(s): INR, PROTIME in the last 168 hours. Cardiac Enzymes: No results for input(s): CKTOTAL, CKMB, CKMBINDEX, TROPONINI in the last 168 hours. BNP (last 3 results) No results for input(s): PROBNP in the last 8760 hours. HbA1C: Recent Labs    02/16/18 2122  HGBA1C 10.8*   CBG: Recent Labs  Lab 02/17/18 2138 02/18/18 0803 02/18/18 1149 02/18/18 1743 02/18/18 2148  GLUCAP 182* 195* 221* 279* 208*   Lipid Profile: No results for input(s): CHOL, HDL, LDLCALC, TRIG, CHOLHDL, LDLDIRECT in the last 72 hours. Thyroid Function Tests: No results for input(s): TSH, T4TOTAL, FREET4, T3FREE, THYROIDAB in the last 72 hours. Anemia Panel: No results for input(s): VITAMINB12, FOLATE, FERRITIN, TIBC, IRON, RETICCTPCT in the last 72 hours. Sepsis Labs: Recent Labs  Lab 02/16/18 1901 02/16/18 2139  LATICACIDVEN 2.60* 1.67    Recent Results (from the past 240 hour(s))  Blood culture (routine x  2)     Status: None (Preliminary result)   Collection Time: 02/16/18  8:22 PM  Result Value Ref Range Status   Specimen Description BLOOD RIGHT ANTECUBITAL  Final   Special Requests   Final    BOTTLES DRAWN AEROBIC AND ANAEROBIC Blood Culture adequate volume   Culture   Final    NO GROWTH 2 DAYS Performed at [facility], [address] [place], [place] [address]    Report Status PENDING  Incomplete  Blood culture (routine x 2)     Status: None (Preliminary result)   Collection Time: 02/16/18  8:25 PM  Result Value Ref Range Status   Specimen Description BLOOD RIGHT UPPER ARM  Final   Special Requests   Final    BOTTLES DRAWN  AEROBIC AND ANAEROBIC Blood Culture adequate volume   Culture   Final    NO GROWTH 2 DAYS Performed at [facility], [address] [place], [place] [address]    Report Status PENDING  Incomplete  Surgical pcr screen     Status: None   Collection Time: 02/17/18 12:35 AM  Result Value Ref Range Status   MRSA, PCR NEGATIVE NEGATIVE Final   Staphylococcus aureus NEGATIVE NEGATIVE Final    Comment: (NOTE) The Xpert SA Assay (FDA approved for NASAL specimens in patients 47 years of age and older), is one component of a comprehensive surveillance program. It is not intended to diagnose infection nor to guide or monitor treatment. Performed at [facility], [address], [place], [place] [address]          Radiology Studies: Dg Eye Foreign Body  Result Date: 02/19/2018 CLINICAL DATA:  Metal working/exposure; clearance prior to MRI EXAM: ORBITS FOR FOREIGN BODY - 2 VIEW COMPARISON:  None. FINDINGS: There is no evidence of metallic foreign body within the orbits. No significant bone abnormality identified. IMPRESSION: No evidence of metallic foreign body within the orbits. Electronically Signed   By: Obie Dredge M.D.   On: 02/19/2018 10:38        Scheduled Meds: . atorvastatin  40 mg Oral q1800  . cyclobenzaprine  10 mg Oral  BID  . DULoxetine  60 mg Oral Daily  . enoxaparin (LOVENOX) injection  40 mg Subcutaneous Q24H  . gabapentin  300 mg Oral BID  . insulin aspart  0-15 Units Subcutaneous TID WC  . insulin aspart  0-5 Units Subcutaneous QHS  . insulin glargine  10 Units Subcutaneous Daily  . insulin glargine  20 Units Subcutaneous QHS  . sodium chloride flush  3 mL Intravenous Q12H   Continuous Infusions: . sodium chloride 100 mL/hr at 02/18/18 1443  . cefTRIAXone (ROCEPHIN)  IV 2 g (02/18/18 2203)  . metronidazole 500 mg (02/19/18 0934)  . vancomycin 750 mg (02/18/18 2025)     LOS: 3 days    Time spent: 35 minutes.     Kathlen Mody, MD [facility] Pager [phone]   If 7PM-7AM, please contact night-coverage [email or web address] Password [email or web address] 02/19/2018, 11:20 AM

## 2018-02-19 NOTE — Consult Note (Signed)
ORTHOPAEDIC CONSULTATION  REQUESTING PHYSICIAN: Hosie Poisson, MD  Chief Complaint: Left diabetic foot ulcer  HPI: Ryan Shaw is a 52 y.o. male who presents with chronic left diabetic foot ulcer over the first metatarsal head.  He is status post great toe and second toe amputations about a year ago.  He was admitted to the hospital service for worsening diabetic foot ulcer and concern for sepsis.  Orthopedics consulted for management of the ulcer.  Patient does have history of chronic renal insufficiency and poorly controlled diabetes.  Past Medical History:  Diagnosis Date  . Arthritis   . CKD (chronic kidney disease)   . Diabetes mellitus without complication ([place])   . Edema   . Hypercholesteremia   . Hypertension   . Neuropathy   . Ulcer of foot due to diabetes ([place]) 01/2018   left foot   Past Surgical History:  Procedure Laterality Date  . AMPUTATION Right 02/17/2015   Procedure: SECOND RAY AMPUTATION;  Surgeon: [name], MD;  Location: [facility];  Service: Orthopedics;  Laterality: Right;  . AMPUTATION Left 10/04/2016   Procedure: GREAT TOE AMPUTATION GASTROCNEMIA RESECTION;  Surgeon: Newt Minion, MD;  Location: [facility];  Service: Orthopedics;  Laterality: Left;  . AMPUTATION Left 12/16/2016   Procedure: LEFT FOOT 2ND TOE AMPUTATION;  Surgeon: Newt Minion, MD;  Location: [facility];  Service: Orthopedics;  Laterality: Left;  . HIP SURGERY Right   . INCISION AND DRAINAGE HIP Right 11/12/2016   Procedure: IRRIGATION AND DEBRIDEMENT RIGHT HIP;  Surgeon: Newt Minion, MD;  Location: [facility];  Service: Orthopedics;  Laterality: Right;   Social History   Socioeconomic History  . Marital status: Legally Separated    Spouse name: Not on file  . Number of children: Not on file  . Years of education: Not on file  . Highest education level: Not on file  Occupational History  . Not on file  Social Needs  . Financial resource strain: Not on file  . Food insecurity:   Worry: Not on file    Inability: Not on file  . Transportation needs:    Medical: Not on file    Non-medical: Not on file  Tobacco Use  . Smoking status: Current Every Day Smoker    Packs/day: 0.10    Types: Cigarettes  . Smokeless tobacco: Never Used  . Tobacco comment: 3 cigs daily  Substance and Sexual Activity  . Alcohol use: No    Comment: patient states he quit drinking since he has been sick  . Drug use: No    Comment: 2 weeks  . Sexual activity: Not on file  Lifestyle  . Physical activity:    Days per week: Not on file    Minutes per session: Not on file  . Stress: Not on file  Relationships  . Social connections:    Talks on phone: Not on file    Gets together: Not on file    Attends religious service: Not on file    Active member of club or organization: Not on file    Attends meetings of clubs or organizations: Not on file    Relationship status: Not on file  Other Topics Concern  . Not on file  Social History Narrative  . Not on file   Family History  Problem Relation Age of Onset  . Cancer Mother   . Aneurysm Father        brain   - negative except otherwise  stated in the family history section Allergies  Allergen Reactions  . Other Nausea And Vomiting    NeuRemedy - Dietary Supplement  . Vancomycin Other (See Comments)    Given after surgery in January 2018 at [facility] - shut kidneys down per pt   Prior to Admission medications   Medication Sig Start Date End Date Taking? Authorizing Provider  ACCU-CHEK SOFTCLIX LANCETS lancets USE AS INSTRUCTED 11/18/17  Yes Charlott Rakes, MD  atorvastatin (LIPITOR) 40 MG tablet Take 1 tablet (40 mg total) by mouth at bedtime. 07/16/17  Yes Charlott Rakes, MD  Blood Glucose Monitoring Suppl (ACCU-CHEK AVIVA PLUS) w/Device KIT Use as directed 07/16/17  Yes Newlin, Enobong, MD  cyclobenzaprine (FLEXERIL) 10 MG tablet TAKE 1 TABLET BY MOUTH 2 TIMES DAILY AS NEEDED FOR MUSCLE SPASMS. 01/07/18   Yes Newlin, Enobong, MD  DULoxetine (CYMBALTA) 60 MG capsule Take 1 capsule (60 mg total) by mouth daily. 07/16/17  Yes Newlin, Charlane Ferretti, MD  glucose blood (ACCU-CHEK AVIVA PLUS) test strip Use as instructed 07/16/17  Yes Newlin, Enobong, MD  insulin aspart (NOVOLOG) 100 UNIT/ML FlexPen Inject 10 Units into the skin 3 (three) times daily with meals. 07/16/17  Yes Charlott Rakes, MD  Insulin Glargine (LANTUS) 100 UNIT/ML Solostar Pen Inject 30 Units into the skin 2 (two) times daily. Patient taking differently: Inject 20 Units into the skin 2 (two) times daily.  07/16/17  Yes Newlin, Charlane Ferretti, MD  Insulin Pen Needle (PEN NEEDLES) 31G X 5 MM MISC 10 Units by Does not apply route 3 (three) times daily. 09/29/17  Yes Charlott Rakes, MD  Insulin Syringes, Disposable, U-100 1 ML MISC 1 each by Does not apply route 3 (three) times daily. 12/10/16  Yes Charlott Rakes, MD  lisinopril (PRINIVIL,ZESTRIL) 5 MG tablet Take 1 tablet (5 mg total) by mouth at bedtime. 07/16/17  Yes Charlott Rakes, MD  naproxen (NAPROSYN) 500 MG tablet Take 1 tablet (500 mg total) by mouth 2 (two) times daily with a meal. Patient taking differently: Take 500 mg by mouth 2 (two) times daily as needed for mild pain.  07/16/17  Yes Newlin, Charlane Ferretti, MD  polyethylene glycol powder (GLYCOLAX/MIRALAX) powder Take 17 g by mouth 2 (two) times daily as needed. Patient taking differently: Take 17 g by mouth 2 (two) times daily as needed for mild constipation.  11/25/16  Yes Charlott Rakes, MD  sildenafil (VIAGRA) 25 MG tablet Take 1 tablet (25 mg total) by mouth daily as needed for erectile dysfunction. 06/07/15  Yes Tresa Garter, MD  gabapentin (NEURONTIN) 300 MG capsule Take 1 capsule (300 mg total) by mouth 2 (two) times daily. Stop once Lyrica arrives Patient not taking: Reported on 02/16/2018 07/16/17   Charlott Rakes, MD  pregabalin (LYRICA) 100 MG capsule Take 1 capsule (100 mg total) by mouth 2 (two) times daily. Patient not  taking: Reported on 02/16/2018 02/11/17   Argentina Donovan, PA-C   No results found. - pertinent xrays, CT, MRI studies were reviewed and independently interpreted  Positive ROS: All other systems have been reviewed and were otherwise negative with the exception of those mentioned in the HPI and as above.  Physical Exam: General: Alert, no acute distress Cardiovascular: No pedal edema Respiratory: No cyanosis, no use of accessory musculature GI: No organomegaly, abdomen is soft and non-tender Skin: No lesions in the area of chief complaint Neurologic: Sensation intact distally Psychiatric: Patient is competent for consent with normal mood and affect Lymphatic: No axillary or  cervical lymphadenopathy  MUSCULOSKELETAL:  2 to 3 cm circular diabetic ulcer on the distal end of the first metatarsal head.  There is exposed bone and subcutaneous tissue.  There is no active drainage.  Strong dorsalis pedis pulse.  Decreased sensation to the forefoot.  Assessment: Nonhealing chronic left diabetic foot ulcer and likely osteomyelitis.  Plan: Findings discussed with the patient and given high likelihood of osteomyelitis of the first metatarsal recommendation at this point is to perform transmetatarsal amputation.  Risks and benefits were discussed with the patient and he is in agreement to proceed tomorrow.  Rehab and recovery were discussed.  N.p.o. after midnight.  Thank you for the consult and the opportunity to see Mr. Cashus Halterman. Eduard Roux, MD [employer] 8:35 AM

## 2018-02-20 ENCOUNTER — Inpatient Hospital Stay (HOSPITAL_COMMUNITY): Payer: Medicaid Other | Admitting: Anesthesiology

## 2018-02-20 ENCOUNTER — Encounter (HOSPITAL_COMMUNITY)
Admission: EM | Disposition: A | Discharge status: Home / Self Care | Payer: Self-pay | Source: Home / Self Care | Attending: Internal Medicine

## 2018-02-20 ENCOUNTER — Encounter (HOSPITAL_COMMUNITY): Payer: Self-pay | Admitting: Anesthesiology

## 2018-02-20 DIAGNOSIS — E11628 Type 2 diabetes mellitus with other skin complications: Secondary | ICD-10-CM

## 2018-02-20 DIAGNOSIS — L089 Local infection of the skin and subcutaneous tissue, unspecified: Secondary | ICD-10-CM

## 2018-02-20 HISTORY — PX: AMPUTATION: SHX166

## 2018-02-20 LAB — BASIC METABOLIC PANEL
Anion gap: 5 (ref 5–15)
BUN: 12 mg/dL (ref 6–20)
CHLORIDE: 103 mmol/L (ref 98–111)
CO2: 31 mmol/L (ref 22–32)
Calcium: 8.6 mg/dL — ABNORMAL LOW (ref 8.9–10.3)
Creatinine, Ser: 1.04 mg/dL (ref 0.61–1.24)
GFR calc Af Amer: >60 mL/min (ref >60)
GFR calc non Af Amer: >60 mL/min (ref >60)
GLUCOSE: 143 mg/dL — AB (ref 70–99)
POTASSIUM: 4.5 mmol/L (ref 3.5–5.1)
Sodium: 139 mmol/L (ref 135–145)

## 2018-02-20 LAB — GLUCOSE, CAPILLARY
GLUCOSE-CAPILLARY: 130 mg/dL — AB (ref 70–99)
GLUCOSE-CAPILLARY: 214 mg/dL — AB (ref 70–99)
Glucose-Capillary: 122 mg/dL — ABNORMAL HIGH (ref 70–99)
Glucose-Capillary: 144 mg/dL — ABNORMAL HIGH (ref 70–99)
Glucose-Capillary: 123 mg/dL — ABNORMAL HIGH (ref 70–99)
Glucose-Capillary: 202 mg/dL — ABNORMAL HIGH (ref 70–99)

## 2018-02-20 LAB — C-REACTIVE PROTEIN: CRP: 3.7 mg/dL — AB (ref <1.0)

## 2018-02-20 SURGERY — AMPUTATION DIGIT
Anesthesia: General | Site: Foot | Laterality: Left

## 2018-02-20 MED ORDER — BUPIVACAINE HCL (PF) 0.25 % IJ SOLN
INTRAMUSCULAR | Status: AC
  Filled 2018-02-20: qty 30

## 2018-02-20 MED ORDER — LIDOCAINE 2% (20 MG/ML) 5 ML SYRINGE
INTRAMUSCULAR | Status: AC
  Filled 2018-02-20: qty 5

## 2018-02-20 MED ORDER — PROPOFOL 10 MG/ML IV BOLUS
INTRAVENOUS | Status: DC | PRN
  Administered 2018-02-20: 200 mg via INTRAVENOUS

## 2018-02-20 MED ORDER — FENTANYL CITRATE (PF) 250 MCG/5ML IJ SOLN
INTRAMUSCULAR | Status: AC
  Filled 2018-02-20: qty 5

## 2018-02-20 MED ORDER — LACTATED RINGERS IV SOLN
INTRAVENOUS | Status: DC | PRN
  Administered 2018-02-20: 08:00:00 via INTRAVENOUS

## 2018-02-20 MED ORDER — MIDAZOLAM HCL 5 MG/5ML IJ SOLN
INTRAMUSCULAR | Status: DC | PRN
  Administered 2018-02-20: 2 mg via INTRAVENOUS

## 2018-02-20 MED ORDER — PHENYLEPHRINE HCL 10 MG/ML IJ SOLN
INTRAMUSCULAR | Status: DC | PRN
  Administered 2018-02-20 (×3): 80 ug via INTRAVENOUS

## 2018-02-20 MED ORDER — OXYCODONE HCL 5 MG PO TABS: 5.0000 mg | ORAL_TABLET | Freq: Once | ORAL | Status: DC | PRN

## 2018-02-20 MED ORDER — SODIUM CHLORIDE 0.9 % IV SOLN
INTRAVENOUS | Status: DC | PRN
  Administered 2018-02-20: 07:00:00 via INTRAVENOUS

## 2018-02-20 MED ORDER — ONDANSETRON HCL 4 MG/2ML IJ SOLN: 4.0000 mg | Freq: Four times a day (QID) | INTRAMUSCULAR | Status: DC | PRN

## 2018-02-20 MED ORDER — ONDANSETRON HCL 4 MG/2ML IJ SOLN
INTRAMUSCULAR | Status: DC | PRN
  Administered 2018-02-20: 4 mg via INTRAVENOUS

## 2018-02-20 MED ORDER — 0.9 % SODIUM CHLORIDE (POUR BTL) OPTIME
TOPICAL | Status: DC | PRN
  Administered 2018-02-20: 2000 mL
  Administered 2018-02-20: 1000 mL

## 2018-02-20 MED ORDER — PROPOFOL 10 MG/ML IV BOLUS
INTRAVENOUS | Status: AC
  Filled 2018-02-20: qty 20

## 2018-02-20 MED ORDER — OXYCODONE HCL 5 MG/5ML PO SOLN: 5.0000 mg | Freq: Once | ORAL | Status: DC | PRN

## 2018-02-20 MED ORDER — ONDANSETRON HCL 4 MG/2ML IJ SOLN
INTRAMUSCULAR | Status: AC
  Filled 2018-02-20: qty 2

## 2018-02-20 MED ORDER — CEFAZOLIN SODIUM 1 G IJ SOLR
INTRAMUSCULAR | Status: AC
  Filled 2018-02-20: qty 20

## 2018-02-20 MED ORDER — EPHEDRINE SULFATE 50 MG/ML IJ SOLN
INTRAMUSCULAR | Status: AC
  Filled 2018-02-20: qty 1

## 2018-02-20 MED ORDER — MIDAZOLAM HCL 2 MG/2ML IJ SOLN
INTRAMUSCULAR | Status: AC
  Filled 2018-02-20: qty 2

## 2018-02-20 MED ORDER — CEFAZOLIN SODIUM-DEXTROSE 2-3 GM-%(50ML) IV SOLR
INTRAVENOUS | Status: DC | PRN
  Administered 2018-02-20: 2 g via INTRAVENOUS

## 2018-02-20 MED ORDER — HYDROMORPHONE HCL 1 MG/ML IJ SOLN: 0.2500 mg | INTRAMUSCULAR | Status: DC | PRN

## 2018-02-20 MED ORDER — FENTANYL CITRATE (PF) 100 MCG/2ML IJ SOLN
INTRAMUSCULAR | Status: DC | PRN
  Administered 2018-02-20: 50 ug via INTRAVENOUS

## 2018-02-20 MED ORDER — EPHEDRINE SULFATE 50 MG/ML IJ SOLN
INTRAMUSCULAR | Status: DC | PRN
  Administered 2018-02-20: 5 mg via INTRAVENOUS
  Administered 2018-02-20: 10 mg via INTRAVENOUS

## 2018-02-20 MED ORDER — SODIUM CHLORIDE 0.9 % IJ SOLN
INTRAMUSCULAR | Status: AC
  Filled 2018-02-20: qty 10

## 2018-02-20 MED ORDER — LIDOCAINE HCL (CARDIAC) PF 100 MG/5ML IV SOSY
PREFILLED_SYRINGE | INTRAVENOUS | Status: DC | PRN
  Administered 2018-02-20: 60 mg via INTRAVENOUS

## 2018-02-20 MED ORDER — DEXAMETHASONE SODIUM PHOSPHATE 10 MG/ML IJ SOLN
INTRAMUSCULAR | Status: AC
  Filled 2018-02-20: qty 1

## 2018-02-20 SURGICAL SUPPLY — 54 items
BLADE AVERAGE 25MMX9MM (BLADE)
BLADE AVERAGE 25X9 (BLADE) IMPLANT
BLADE LONG MED 31MMX9MM (MISCELLANEOUS) ×1
BLADE LONG MED 31X9 (MISCELLANEOUS) ×2 IMPLANT
BNDG COHESIVE 4X5 TAN STRL (GAUZE/BANDAGES/DRESSINGS) ×2 IMPLANT
BNDG CONFORM 2 STRL LF (GAUZE/BANDAGES/DRESSINGS) ×1 IMPLANT
BNDG CONFORM 3 STRL LF (GAUZE/BANDAGES/DRESSINGS) IMPLANT
BNDG ESMARK 4X9 LF (GAUZE/BANDAGES/DRESSINGS) ×3 IMPLANT
CANISTER SUCT 3000ML PPV (MISCELLANEOUS) ×3 IMPLANT
CORDS BIPOLAR (ELECTRODE) IMPLANT
COVER SURGICAL LIGHT HANDLE (MISCELLANEOUS) ×3 IMPLANT
CUFF TOURNIQUET SINGLE 18IN (TOURNIQUET CUFF) IMPLANT
CUFF TOURNIQUET SINGLE 24IN (TOURNIQUET CUFF) IMPLANT
CUFF TOURNIQUET SINGLE 34IN LL (TOURNIQUET CUFF) ×2 IMPLANT
DRAPE SURG 17X23 STRL (DRAPES) IMPLANT
DRAPE U-SHAPE 47X51 STRL (DRAPES) IMPLANT
DURAPREP 26ML APPLICATOR (WOUND CARE) ×1 IMPLANT
ELECT CAUTERY BLADE 6.4 (BLADE) ×2 IMPLANT
ELECT REM PT RETURN 9FT ADLT (ELECTROSURGICAL) ×3
ELECTRODE REM PT RTRN 9FT ADLT (ELECTROSURGICAL) ×1 IMPLANT
FACESHIELD WRAPAROUND (MASK) ×3 IMPLANT
FACESHIELD WRAPAROUND OR TEAM (MASK) ×1 IMPLANT
GAUZE SPONGE 4X4 12PLY STRL LF (GAUZE/BANDAGES/DRESSINGS) ×4 IMPLANT
GAUZE XEROFORM 1X8 LF (GAUZE/BANDAGES/DRESSINGS) ×2 IMPLANT
GLOVE BIOGEL PI IND STRL 7.0 (GLOVE) ×1 IMPLANT
GLOVE BIOGEL PI INDICATOR 7.0 (GLOVE) ×6
GLOVE ECLIPSE 7.0 STRL STRAW (GLOVE) ×5 IMPLANT
GLOVE SKINSENSE NS SZ7.5 (GLOVE) ×2
GLOVE SKINSENSE STRL SZ7.5 (GLOVE) ×1 IMPLANT
GOWN STRL REIN XL XLG (GOWN DISPOSABLE) ×4 IMPLANT
HANDPIECE INTERPULSE COAX TIP (DISPOSABLE)
KIT BASIN OR (CUSTOM PROCEDURE TRAY) ×3 IMPLANT
KIT TURNOVER KIT B (KITS) ×3 IMPLANT
NDL HYPO 25GX1X1/2 BEV (NEEDLE) IMPLANT
NEEDLE HYPO 25GX1X1/2 BEV (NEEDLE) IMPLANT
NS IRRIG 1000ML POUR BTL (IV SOLUTION) ×7 IMPLANT
PACK ORTHO EXTREMITY (CUSTOM PROCEDURE TRAY) ×3 IMPLANT
PAD ABD 8X10 STRL (GAUZE/BANDAGES/DRESSINGS) ×4 IMPLANT
PAD ARMBOARD 7.5X6 YLW CONV (MISCELLANEOUS) ×6 IMPLANT
SET HNDPC FAN SPRY TIP SCT (DISPOSABLE) IMPLANT
SPECIMEN JAR SMALL (MISCELLANEOUS) ×1 IMPLANT
SUT ETHILON 2 0 FS 18 (SUTURE) IMPLANT
SUT ETHILON 2 0 PSLX (SUTURE) ×6 IMPLANT
SUT VIC AB 0 CT1 27 (SUTURE) ×2
SUT VIC AB 0 CT1 27XBRD ANBCTR (SUTURE) IMPLANT
SUT VIC AB 2-0 CT1 27 (SUTURE) ×6
SUT VIC AB 2-0 CT1 TAPERPNT 27 (SUTURE) IMPLANT
SYR CONTROL 10ML LL (SYRINGE) IMPLANT
TOWEL OR 17X24 6PK STRL BLUE (TOWEL DISPOSABLE) ×1 IMPLANT
TOWEL OR 17X26 10 PK STRL BLUE (TOWEL DISPOSABLE) ×3 IMPLANT
TUBE CONNECTING 12'X1/4 (SUCTIONS) ×1
TUBE CONNECTING 12X1/4 (SUCTIONS) ×1 IMPLANT
TUBING CYSTO DISP (UROLOGICAL SUPPLIES) IMPLANT
WATER STERILE IRR 1000ML POUR (IV SOLUTION) ×3 IMPLANT

## 2018-02-20 NOTE — Anesthesia Postprocedure Evaluation (Signed)
Anesthesia Post Note  Patient: Ryan Shaw  Procedure(s) Performed: transmetatarsal amputation left foot (Left Foot)     Patient location during evaluation: PACU Anesthesia Type: General Level of consciousness: awake and alert Pain management: pain level controlled Vital Signs Assessment: post-procedure vital signs reviewed and stable Respiratory status: spontaneous breathing, nonlabored ventilation, respiratory function stable and patient connected to nasal cannula oxygen Cardiovascular status: blood pressure returned to baseline and stable Postop Assessment: no apparent nausea or vomiting Anesthetic complications: no    Last Vitals:  Vitals:   02/20/18 0915 02/20/18 0929  BP: 118/79   Pulse: 87 89  Resp: 13 19  Temp:    SpO2: 97% 97%    Last Pain:  Vitals:   02/20/18 0247  TempSrc:   PainSc: Asleep                 Omaira Mellen S

## 2018-02-20 NOTE — H&P (Signed)
H&P update  The surgical history has been reviewed and remains accurate without interval change.  The patient was re-examined and patient's physiologic condition has not changed significantly in the last 30 days. The condition still exists that makes this procedure necessary. The treatment plan remains the same, without new options for care.  No new pharmacological allergies or types of therapy has been initiated that would change the plan or the appropriateness of the plan.  The patient and/or family understand the potential benefits and risks.  Mayra ReelN. Michael Xu, MD 02/20/2018 12:21 AM

## 2018-02-20 NOTE — Transfer of Care (Signed)
Immediate Anesthesia Transfer of Care Note  Patient: Ryan Shaw  Procedure(s) Performed: transmetatarsal amputation left foot (Left Foot)  Patient Location: PACU  Anesthesia Type:General  Level of Consciousness: awake, alert  and oriented  Airway & Oxygen Therapy: Patient Spontanous Breathing and Patient connected to nasal cannula oxygen  Post-op Assessment: Report given to RN, Post -op Vital signs reviewed and stable and Patient moving all extremities X 4  Post vital signs: Reviewed and stable  Last Vitals:  Vitals Value Taken Time  BP 118/62 02/20/2018  9:00 AM  Temp    Pulse 89 02/20/2018  9:00 AM  Resp 17 02/20/2018  9:00 AM  SpO2 98 % 02/20/2018  9:00 AM  Vitals shown include unvalidated device data.  Last Pain:  Vitals:   02/20/18 0247  TempSrc:   PainSc: Asleep      Patients Stated Pain Goal: 6 (02/19/18 2018)  Complications: No apparent anesthesia complications

## 2018-02-20 NOTE — Anesthesia Preprocedure Evaluation (Addendum)
Anesthesia Evaluation  Patient identified by MRN, date of birth, ID band Patient awake    Reviewed: Allergy & Precautions, H&P , NPO status , Patient's Chart, lab work & pertinent test results  Airway Mallampati: II  TM Distance: >3 FB Neck ROM: full    Dental  (+) Missing, Dental Advidsory Given   Pulmonary Current Smoker,    breath sounds clear to auscultation       Cardiovascular hypertension,  Rhythm:regular Rate:Normal     Neuro/Psych PSYCHIATRIC DISORDERS Anxiety  Neuromuscular disease    GI/Hepatic   Endo/Other  diabetes, Type 2, Insulin Dependent  Renal/GU      Musculoskeletal  (+) Arthritis ,   Abdominal   Peds  Hematology   Anesthesia Other Findings   Reproductive/Obstetrics                            Anesthesia Physical Anesthesia Plan  ASA: III  Anesthesia Plan: General   Post-op Pain Management:    Induction: Intravenous  PONV Risk Score and Plan: 1 and Ondansetron, Midazolam and Treatment may vary due to age or medical condition  Airway Management Planned: LMA  Additional Equipment:   Intra-op Plan:   Post-operative Plan:   Informed Consent: I have reviewed the patients History and Physical, chart, labs and discussed the procedure including the risks, benefits and alternatives for the proposed anesthesia with the patient or authorized representative who has indicated his/her understanding and acceptance.   Dental Advisory Given  Plan Discussed with: CRNA, Anesthesiologist and Surgeon  Anesthesia Plan Comments:        Anesthesia Quick Evaluation

## 2018-02-20 NOTE — Anesthesia Procedure Notes (Signed)
Procedure Name: LMA Insertion Date/Time: 02/20/2018 7:37 AM Performed by: Carmela RimaMartinelli, Brandilee Pies F, CRNA Pre-anesthesia Checklist: Timeout performed, Patient being monitored, Suction available, Emergency Drugs available and Patient identified Patient Re-evaluated:Patient Re-evaluated prior to induction Preoxygenation: Pre-oxygenation with 100% oxygen Induction Type: IV induction Ventilation: Mask ventilation without difficulty LMA: LMA inserted LMA Size: 5.0 Number of attempts: 1 Placement Confirmation: breath sounds checked- equal and bilateral,  positive ETCO2 and ETT inserted through vocal cords under direct vision Tube secured with: Tape Dental Injury: Teeth and Oropharynx as per pre-operative assessment

## 2018-02-20 NOTE — Progress Notes (Signed)
PROGRESS NOTE    Ryan ClockWilber Osterberg  JWJ:[id number] DOB: 06/21/1966 DOA: 02/16/2018 PCP: Hoy RegisterNewlin, Enobong, MD    Brief Narrative: Ryan Shaw is a 52 y.o. male with medical history significant for uncontrolled insulin-dependent diabetes mellitus, chronic kidney disease stage II-III, and chronic foot ulcers status post toe amputations followed by orthopedic surgery, and now presenting to the emergency department for evaluation of subjective fevers, chills, and increasing pain, swelling, drainage, and odor from his left foot wound    Assessment & Plan:   Principal Problem:   Diabetic foot infection (HCC) Active Problems:   Diabetic peripheral neuropathy (HCC)   Uncontrolled type 2 diabetes mellitus with hyperosmolar nonketotic hyperglycemia (HCC)   Essential hypertension   Amputee, great toe, left (HCC)   Diabetic foot ulcer (HCC)   Hyperlipidemia   Hyponatremia   Chronic renal insufficiency   Worsening diabetic foot ulcer:  On IV vancomycin, rocephin and flagyl. Continue the same.  X rays do not show any osteomyelitis. MRI of the foot shows  Diffuse myofasciitis and cellulitis and  Degenerative lesion in the third proximal phalanx Orthopedics consulted plan for trans metatarsal amputation.  Pain control .  ABI wnl.  Blood cultures have been negative so far.  Wound care consulted and recommendations given.  Lactic acid normalized. Afebrile and no leukocytosis.    Uncontrolled DM with hyperglycemia: A1c is 10.9.  CBG (last 3)  Recent Labs    02/19/18 1614 02/19/18 2213 02/20/18 0600  GLUCAP 278* 268* 122*   Resume SSI for now. Patient is on Lantus 20 units BID at home, but he is non compliant,.  Currently on 20 units of BID lantus. Will add novolog 3 units TIDAC for better control of CBG'S.     Hyponatremia:  Possibly from dehydration. Resolved with hydration.      Acute on Stage 3 CKD:  Suspect from dehydration and possible ATN from infection.  Creatinine  improved with hydration. Avoid NSAIDS, ( he was on naproxen at home).  Hold nephrotoxins. Pt on Lisinopril, which was held on admission.  Creatinine stable at this time.     Mild normocytic anemia:  Hemoglobin at 11.7. It appears this is his baseline.   Hypertension:  Well controlled. Resume lisinopril, if his creatinine stays stable.    Hyperlipidemia:  Resume lipitor. Outpatient follow up with lipid panel.    Diabetic neuropathy:  Stable. On gabapentin at home which was resumed.     DVT prophylaxis: lovenox.  Code Status: full code.  Family Communication: none at bedside.  Disposition Plan: pending work up by orthopedics   Consultants:   Orthopedics Dr Cleophas DunkerWhitfield.    Procedures: MRI FOOT PENDING.    Antimicrobials: vancomycin and rocephin and flagyl since admission.    Subjective: No new complaints today.   Objective: Vitals:   02/19/18 0650 02/19/18 1957 02/20/18 0509 02/20/18 0515  BP: 119/78 113/74  106/74  Pulse: 81 83  80  Resp: 18 16  16   Temp: 98.2 F (36.8 C) 98 F (36.7 C) 98 F (36.7 C)   TempSrc: Oral Oral    SpO2: 100% 100%  100%  Weight: 109.7 kg (241 lb 13.5 oz)  110.9 kg (244 lb 7.8 oz)   Height:        Intake/Output Summary (Last 24 hours) at 02/20/2018 0830 Last data filed at 02/20/2018 0759 Gross per 24 hour  Intake 503 ml  Output -  Net 503 ml   Filed Weights   02/18/18 0500 02/19/18 0650 02/20/18 [id number]  Weight: 108.7 kg (239 lb 11.2 oz) 109.7 kg (241 lb 13.5 oz) 110.9 kg (244 lb 7.8 oz)    Examination:  General exam:alert and comfortable, not in distress.  Respiratory system:clear to auscultation, no wheezing or rhonchi.  Cardiovascular system: S1 & S2 heard, RRR. No JVD, Gastrointestinal system: Abdomen is soft NT ND BS+ Central nervous system: Alert and oriented. Non focal.  Extremities: left foot ulcer  at the great toe amputation area, Some drainage of serosanguinous discharge. Skin: No rashes, lesions or  ulcers Psychiatry:. Mood & affect appropriate.     Data Reviewed: I have personally reviewed following labs and imaging studies  CBC: Recent Labs  Lab 02/16/18 1853 02/18/18 0505  WBC 10.2 7.5  NEUTROABS 7.5  --   HGB 13.6 11.7*  HCT 41.9 35.4*  MCV 85.2 84.3  PLT 319 231   Basic Metabolic Panel: Recent Labs  Lab 02/16/18 1853 02/17/18 0233 02/18/18 0505 02/19/18 0514 02/20/18 0410  NA 126* 138 136 137 139  K 5.0 4.1 4.6 4.5 4.5  CL 93* 104 103 103 103  CO2 21* 27 27 29 31   GLUCOSE 648* 222* 232* 216* 143*  BUN 21* 17 11 11 12   CREATININE 1.65* 1.35* 1.06 1.13 1.04  CALCIUM 9.0 8.7* 8.4* 8.6* 8.6*   GFR: Estimated Creatinine Clearance: 117.9 mL/min (by C-G formula based on SCr of 1.04 mg/dL). Liver Function Tests: Recent Labs  Lab 02/16/18 1853  AST 27  ALT 28  ALKPHOS 142*  BILITOT 0.6  PROT 7.4  ALBUMIN 3.6   No results for input(s): LIPASE, AMYLASE in the last 168 hours. No results for input(s): AMMONIA in the last 168 hours. Coagulation Profile: No results for input(s): INR, PROTIME in the last 168 hours. Cardiac Enzymes: No results for input(s): CKTOTAL, CKMB, CKMBINDEX, TROPONINI in the last 168 hours. BNP (last 3 results) No results for input(s): PROBNP in the last 8760 hours. HbA1C: No results for input(s): HGBA1C in the last 72 hours. CBG: Recent Labs  Lab 02/19/18 0759 02/19/18 1241 02/19/18 1614 02/19/18 2213 02/20/18 0600  GLUCAP 168* 143* 278* 268* 122*   Lipid Profile: No results for input(s): CHOL, HDL, LDLCALC, TRIG, CHOLHDL, LDLDIRECT in the last 72 hours. Thyroid Function Tests: No results for input(s): TSH, T4TOTAL, FREET4, T3FREE, THYROIDAB in the last 72 hours. Anemia Panel: No results for input(s): VITAMINB12, FOLATE, FERRITIN, TIBC, IRON, RETICCTPCT in the last 72 hours. Sepsis Labs: Recent Labs  Lab 02/16/18 1901 02/16/18 2139  LATICACIDVEN 2.60* 1.67    Recent Results (from the past 240 hour(s))  Blood  culture (routine x 2)     Status: None (Preliminary result)   Collection Time: 02/16/18  8:22 PM  Result Value Ref Range Status   Specimen Description BLOOD RIGHT ANTECUBITAL  Final   Special Requests   Final    BOTTLES DRAWN AEROBIC AND ANAEROBIC Blood Culture adequate volume   Culture   Final    NO GROWTH 3 DAYS Performed at [facility], [address] [place], [place] [address]    Report Status PENDING  Incomplete  Blood culture (routine x 2)     Status: None (Preliminary result)   Collection Time: 02/16/18  8:25 PM  Result Value Ref Range Status   Specimen Description BLOOD RIGHT UPPER ARM  Final   Special Requests   Final    BOTTLES DRAWN AEROBIC AND ANAEROBIC Blood Culture adequate volume   Culture   Final    NO GROWTH 3  DAYS Performed at [facility], [address] [place], [place] [address]    Report Status PENDING  Incomplete  Surgical pcr screen     Status: None   Collection Time: 02/17/18 12:35 AM  Result Value Ref Range Status   MRSA, PCR NEGATIVE NEGATIVE Final   Staphylococcus aureus NEGATIVE NEGATIVE Final    Comment: (NOTE) The Xpert SA Assay (FDA approved for NASAL specimens in patients 44 years of age and older), is one component of a comprehensive surveillance program. It is not intended to diagnose infection nor to guide or monitor treatment. Performed at [facility], [address] [place], [place] [address]   MRSA PCR Screening     Status: None   Collection Time: 02/19/18  4:46 PM  Result Value Ref Range Status   MRSA by PCR NEGATIVE NEGATIVE Final    Comment:        The GeneXpert MRSA Assay (FDA approved for NASAL specimens only), is one component of a comprehensive MRSA colonization surveillance program. It is not intended to diagnose MRSA infection nor to guide or monitor treatment for MRSA infections. Performed at [facility], [address] [place], [place] [address]          Radiology Studies: Dg Eye  Foreign Body  Result Date: 02/19/2018 CLINICAL DATA:  Metal working/exposure; clearance prior to MRI EXAM: ORBITS FOR FOREIGN BODY - 2 VIEW COMPARISON:  None. FINDINGS: There is no evidence of metallic foreign body within the orbits. No significant bone abnormality identified. IMPRESSION: No evidence of metallic foreign body within the orbits. Electronically Signed   By: Obie Dredge M.D.   On: 02/19/2018 10:38   Mr Foot Left W Wo Contrast  Result Date: 02/19/2018 CLINICAL DATA:  History of first and second toe amputations with recurrent soft tissue swelling and diabetic foot ulcer. EXAM: MRI OF THE LEFT FOREFOOT WITHOUT AND WITH CONTRAST TECHNIQUE: Multiplanar, multisequence MR imaging of the left forefoot was performed both before and after administration of intravenous contrast. CONTRAST:  20mL MULTIHANCE GADOBENATE DIMEGLUMINE 529 MG/ML IV SOLN COMPARISON:  MRI 11/10/2016 and radiographs 02/16/2018 FINDINGS: Diffuse subcutaneous soft tissue swelling/edema/fluid surrounding the great toe with an open ulcer at the amputation site over the great toe. No discrete drainable soft tissue abscess is identified. Diffuse enhancement after contrast but no focal fluid collection. No definite MR findings to suggest osteomyelitis involving the first metatarsal head or sesamoid bones. The other bony structures are intact without evidence of edema or enhancement. There is an intramedullary lesion in the third proximal phalanx which I do not see on the prior MRI. I think this is probably some type of subchondral cyst or intraosseous ganglion given the degenerative changes at the joint. This may be progressive degenerative changes due to altered mechanics from the first and second toe amputations. No worrisome enhancement to suggest this is a bone abscess/osteomyelitis. Diffuse myofasciitis without definite findings for pyomyositis or septic tenosynovitis. IMPRESSION: 1. Diabetic foot ulcer involving the soft tissues  overlying the head of the first metatarsal but no discrete drainable soft tissue abscess or MR findings to suggest septic arthritis or osteomyelitis. 2. Diffuse myofasciitis and cellulitis. 3. Lesion in the third proximal phalanx is likely degenerative. Electronically Signed   By: Rudie Meyer M.D.   On: 02/19/2018 12:17        Scheduled Meds: . [MAR Hold] atorvastatin  40 mg Oral q1800  . [MAR Hold] cyclobenzaprine  10 mg Oral BID  . [  MAR Hold] DULoxetine  60 mg Oral Daily  . [MAR Hold] enoxaparin (LOVENOX) injection  40 mg Subcutaneous Q24H  . [MAR Hold] gabapentin  300 mg Oral BID  . [MAR Hold] insulin aspart  0-15 Units Subcutaneous TID WC  . [MAR Hold] insulin aspart  0-5 Units Subcutaneous QHS  . [MAR Hold] insulin glargine  20 Units Subcutaneous QHS  . [MAR Hold] insulin glargine  20 Units Subcutaneous Daily  . [MAR Hold] sodium chloride flush  3 mL Intravenous Q12H   Continuous Infusions: . sodium chloride Stopped (02/19/18 1928)  . [MAR Hold] cefTRIAXone (ROCEPHIN)  IV 2 g (02/19/18 2222)  . [MAR Hold] metronidazole 500 mg (02/19/18 2344)  . [MAR Hold] vancomycin 750 mg (02/19/18 2015)     LOS: 4 days    Time spent: 35 minutes.     Kathlen Mody, MD [facility] Pager [phone]   If 7PM-7AM, please contact night-coverage [email or web address] Password [email or web address] 02/20/2018, 8:30 AM

## 2018-02-20 NOTE — Discharge Instructions (Signed)
Postoperative instructions: ° °Weightbearing instructions: non weight bearing ° °Dressing instructions: Keep your dressing and/or splint clean and dry at all times.  It will be removed at your first post-operative appointment.  Your stitches and/or staples will be removed at this visit. ° °Incision instructions:  Do not soak your incision for 3 weeks after surgery.  If the incision gets wet, pat dry and do not scrub the incision. ° °Pain control:  You have been given a prescription to be taken as directed for post-operative pain control.  In addition, elevate the operative extremity above the heart at all times to prevent swelling and throbbing pain. ° °Take over-the-counter Colace, 100mg by mouth twice a day while taking narcotic pain medications to help prevent constipation. ° °Follow up appointments: °1) 10-14 days for suture removal and wound check. °2) Dr. Lorrinda Ramstad as scheduled. ° ° ------------------------------------------------------------------------------------------------------------- ° °After Surgery Pain Control: ° °After your surgery, post-surgical discomfort or pain is likely. This discomfort can last several days to a few weeks. At certain times of the day your discomfort may be more intense.  °Did you receive a nerve block?  °A nerve block can provide pain relief for one hour to two days after your surgery. As long as the nerve block is working, you will experience little or no sensation in the area the surgeon operated on.  °As the nerve block wears off, you will begin to experience pain or discomfort. It is very important that you begin taking your prescribed pain medication before the nerve block fully wears off. Treating your pain at the first sign of the block wearing off will ensure your pain is better controlled and more tolerable when full-sensation returns. Do not wait until the pain is intolerable, as the medicine will be less effective. It is better to treat pain in advance than to try and  catch up.  °General Anesthesia:  °If you did not receive a nerve block during your surgery, you will need to start taking your pain medication shortly after your surgery and should continue to do so as prescribed by your surgeon.  °Pain Medication:  °Most commonly we prescribe Vicodin and Percocet for post-operative pain. Both of these medications contain a combination of acetaminophen (Tylenol®) and a narcotic to help control pain.  °· It takes between 30 and 45 minutes before pain medication starts to work. It is important to take your medication before your pain level gets too intense.  °· Nausea is a common side effect of many pain medications. You will want to eat something before taking your pain medicine to help prevent nausea.  °· If you are taking a prescription pain medication that contains acetaminophen, we recommend that you do not take additional over the counter acetaminophen (Tylenol®).  °Other pain relieving options:  °· Using a cold pack to ice the affected area a few times a day (15 to 20 minutes at a time) can help to relieve pain, reduce swelling and bruising.  °· Elevation of the affected area can also help to reduce pain and swelling. ° ° ° °

## 2018-02-20 NOTE — Progress Notes (Signed)
Pharmacy Antibiotic Note  Ryan Shaw is a 52 y.o. male admitted on 02/16/2018 with fever/chills, foot pain and drainage. Starting empiric antibiotics for diabetic foot ulcer. SCr improved  S/p amputation 7/21, stop dates in place for antibiotics to end 7/22 (24 hours post amputation)  Plan: No change in antibiotic doses  Height: 6\' 6"  (198.1 cm) Weight: 244 lb 7.8 oz (110.9 kg) IBW/kg (Calculated) : 91.4  Temp (24hrs), Avg:97.8 F (36.6 C), Min:97.5 F (36.4 C), Max:98 F (36.7 C)  Recent Labs  Lab 02/16/18 1853 02/16/18 1901 02/16/18 2139 02/17/18 0233 02/18/18 0505 02/19/18 0514 02/20/18 0410  WBC 10.2  --   --   --  7.5  --   --   CREATININE 1.65*  --   --  1.35* 1.06 1.13 1.04  LATICACIDVEN  --  2.60* 1.67  --   --   --   --      Antimicrobials this admission: 7/17 vancomycin > 7/17 ceftriaxone > 7/17 daptomycin x1  Dose adjustments this admission: N/A  Microbiology results: 7/17 blood cx:  Thank you Okey RegalLisa Timothy Trudell, PharmD [phone] 02/20/2018 12:00 PM

## 2018-02-20 NOTE — Op Note (Signed)
   Date of Surgery: 02/20/2018  INDICATIONS: Ryan Shaw is a 52 y.o.-year-old male with a diabetic left foot ulcer.  The patient did consent to the procedure after discussion of the risks and benefits.  PREOPERATIVE DIAGNOSIS: Left diabetic foot ulcer   POSTOPERATIVE DIAGNOSIS: Same.  PROCEDURE:  1.  Left transmetatarsal amputation 2.  Adjacent tissue rearrangement 7 cm left foot  SURGEON: N. Glee ArvinMichael Falan Hensler, M.D.  ASSIST: Starlyn SkeansMary Lindsey [place], [place]; necessary for the timely completion of procedure and due to complexity of procedure.  ANESTHESIA:  general  IV FLUIDS AND URINE: See anesthesia.  ESTIMATED BLOOD LOSS: Minimal mL.  IMPLANTS: None  DRAINS: None  COMPLICATIONS: None.  DESCRIPTION OF PROCEDURE: The patient was brought to the operating room and placed supine on the operating table.  The patient had been signed prior to the procedure and this was documented. The patient had the anesthesia placed by the anesthesiologist.  A time-out was performed to confirm that this was the correct patient, site, side and location. The patient did receive antibiotics prior to the incision and was re-dosed during the procedure as needed at indicated intervals.  A tourniquet was placed.  The patient had the operative extremity prepped and draped in the standard surgical fashion.    We first made a large fishmouth incision with a long plantar full-thickness flap.  The nonhealing ulcer was excised in a full-thickness fashion.  Dissection was carried down through the subcutaneous tissue onto the metatarsals.  Full-thickness flaps were elevated.  The metatarsals were then individually exposed.  An oscillating saw was used to perform the transmetatarsal amputation.  After this was done I then performed adjacent tissue rearrangement in order to close the surgical wound.  The tourniquet was then deflated.  Hemostasis was obtained.  The wound was then irrigated with 3 L normal saline.  Surgical wound was then  closed in a layered fashion using 0 Vicryl, 2-0 Vicryl, 2-0 nylon.  Sterile dressings were applied.  Patient tolerated procedure well had no immediate complications.  POSTOPERATIVE PLAN: Patient will need to be nonweightbearing to the left lower extremity until the incision is fully healed.  Ryan ReelN. Michael Randolf Sansoucie, MD [facility] [phone] 8:27 AM

## 2018-02-21 ENCOUNTER — Encounter (HOSPITAL_COMMUNITY): Payer: Self-pay | Admitting: Orthopaedic Surgery

## 2018-02-21 DIAGNOSIS — S98112A Complete traumatic amputation of left great toe, initial encounter: Secondary | ICD-10-CM

## 2018-02-21 DIAGNOSIS — R739 Hyperglycemia, unspecified: Secondary | ICD-10-CM

## 2018-02-21 DIAGNOSIS — I10 Essential (primary) hypertension: Secondary | ICD-10-CM

## 2018-02-21 DIAGNOSIS — E1142 Type 2 diabetes mellitus with diabetic polyneuropathy: Secondary | ICD-10-CM

## 2018-02-21 LAB — GLUCOSE, CAPILLARY
Glucose-Capillary: 82 mg/dL (ref 70–99)
Glucose-Capillary: 210 mg/dL — ABNORMAL HIGH (ref 70–99)
Glucose-Capillary: 152 mg/dL — ABNORMAL HIGH (ref 70–99)
Glucose-Capillary: 171 mg/dL — ABNORMAL HIGH (ref 70–99)

## 2018-02-21 LAB — BASIC METABOLIC PANEL
Anion gap: 7 (ref 5–15)
BUN: 11 mg/dL (ref 6–20)
CALCIUM: 8.4 mg/dL — AB (ref 8.9–10.3)
CO2: 29 mmol/L (ref 22–32)
CREATININE: 1 mg/dL (ref 0.61–1.24)
Chloride: 103 mmol/L (ref 98–111)
GFR calc Af Amer: >60 mL/min (ref >60)
Glucose, Bld: 143 mg/dL — ABNORMAL HIGH (ref 70–99)
Potassium: 4.2 mmol/L (ref 3.5–5.1)
SODIUM: 139 mmol/L (ref 135–145)

## 2018-02-21 LAB — CBC
HCT: 36.9 % — ABNORMAL LOW (ref 39.0–52.0)
Hemoglobin: 11.6 g/dL — ABNORMAL LOW (ref 13.0–17.0)
MCH: 27.2 pg (ref 26.0–34.0)
MCHC: 31.4 g/dL (ref 30.0–36.0)
MCV: 86.6 fL (ref 78.0–100.0)
PLATELETS: 291 10*3/uL (ref 150–400)
RBC: 4.26 MIL/uL (ref 4.22–5.81)
RDW: 12.6 % (ref 11.5–15.5)
WBC: 6.7 10*3/uL (ref 4.0–10.5)

## 2018-02-21 LAB — CULTURE, BLOOD (ROUTINE X 2)
CULTURE: NO GROWTH
Culture: NO GROWTH
SPECIAL REQUESTS: ADEQUATE
Special Requests: ADEQUATE

## 2018-02-21 MED ORDER — INSULIN ASPART 100 UNIT/ML ~~LOC~~ SOLN
3.0000 [IU] | Freq: Three times a day (TID) | SUBCUTANEOUS | Status: DC
  Administered 2018-02-21 – 2018-02-22 (×3): 3 [IU] via SUBCUTANEOUS

## 2018-02-21 NOTE — Progress Notes (Signed)
Subjective: 1 Day Post-Op Procedure(s) (LRB): transmetatarsal amputation left foot (Left) Patient reports pain as moderate.  Doing well without complaints.  Objective: Vital signs in last 24 hours: Temp:  [98.2 F (36.8 C)-98.6 F (37 C)] 98.6 F (37 C) (07/22 1215) Pulse Rate:  [81-94] 81 (07/22 1215) Resp:  [16-18] 18 (07/22 1215) BP: (112-133)/(69-82) 133/72 (07/22 1215) SpO2:  [98 %-100 %] 98 % (07/22 1215) Weight:  [246 lb 4.1 oz (111.7 kg)] 246 lb 4.1 oz (111.7 kg) (07/22 0527)  Intake/Output from previous day: 07/21 0701 - 07/22 0700 In: 3359.1 [P.O.:520; I.V.:1520; IV Piggyback:1269.1] Out: 950 [Urine:900; Blood:50] Intake/Output this shift: No intake/output data recorded.  Recent Labs    02/21/18 0541  HGB 11.6*   Recent Labs    02/21/18 0541  WBC 6.7  RBC 4.26  HCT 36.9*  PLT 291   Recent Labs    02/20/18 0410 02/21/18 0541  NA 139 139  K 4.5 4.2  CL 103 103  CO2 31 29  BUN 12 11  CREATININE 1.04 1.00  GLUCOSE 143* 143*  CALCIUM 8.6* 8.4*   No results for input(s): LABPT, INR in the last 72 hours.  Incision: dressing C/D/I Compartment soft    Assessment/Plan: 1 Day Post-Op Procedure(s) (LRB): transmetatarsal amputation left foot (Left) Up with therapy  NWB LLE F/u with Dr. Roda ShuttersXu in one week    Ryan Shaw 02/21/2018, 1:36 PM

## 2018-02-21 NOTE — Evaluation (Signed)
Physical Therapy Evaluation Patient Details Name: Ryan Shaw MRN: [id number] DOB: January 12, 1966 Today's Date: 02/21/2018   History of Present Illness  52 y.o. male admitted on 02/16/18 for L foot infection.  Pt s/p L transmetatarsal amputation on 02/20/18 and is NWB post op L foot.  Pt with significant PMH of neuropathy, HTN, DM, CKD, I and D of R hip, R second ray amputation and multiple L foot amutations.    Clinical Impression  Pt was able to get up and hop a good distance down the hallway with RW.  He maintained NWB on his left foot throughout gait without difficulty.  He has support at home and will likely not need PT follow up as he is unfortunately experienced in post surgical recovery.   PT to follow acutely for deficits listed below.       Follow Up Recommendations No PT follow up    Equipment Recommendations  Rolling walker with 5" wheels    Recommendations for Other Services   NA     Precautions / Restrictions Precautions Precautions: Fall Precaution Comments: due to NWB status Restrictions Weight Bearing Restrictions: Yes LLE Weight Bearing: Non weight bearing      Mobility  Bed Mobility Overal bed mobility: Independent                Transfers Overall transfer level: Needs assistance Equipment used: Rolling walker (2 wheeled) Transfers: Sit to/from Stand Sit to Stand: Supervision         General transfer comment: supervision for safety from low bed  Ambulation/Gait Ambulation/Gait assistance: Min guard Gait Distance (Feet): 80 Feet Assistive device: Rolling walker (2 wheeled) Gait Pattern/deviations: Step-to pattern(hop to )     General Gait Details: Min guard assist for safety.  Hop to pattern with good ability to maintain NWB left leg.          Balance Overall balance assessment: Needs assistance Sitting-balance support: Feet supported;No upper extremity supported Sitting balance-Leahy Scale: Good     Standing balance support: No upper  extremity supported Standing balance-Leahy Scale: Good                               Pertinent Vitals/Pain Pain Assessment: 0-10 Pain Score: 10-Worst pain ever Pain Location: left leg during gait Pain Descriptors / Indicators: Throbbing Pain Intervention(s): Limited activity within patient's tolerance;Monitored during session;Repositioned    Home Living Family/patient expects to be discharged to:: Private residence Living Arrangements: Children;Other relatives(daughter and 4 grandchildren ranging from 30 y.o. to 64 y.o. 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)     Occupational psychologist: Right    Extremity/Trunk Assessment   Upper Extremity Assessment Upper Extremity Assessment: Overall WFL for tasks assessed    Lower Extremity Assessment Lower Extremity Assessment: LLE deficits/detail LLE Deficits / Details: left leg with intact ankle DF/PF, at baseline decreased sensation/peripheral neuropathy, knee and hip WNL LLE Sensation: history of peripheral neuropathy    Cervical / Trunk Assessment Cervical / Trunk Assessment: Normal  Communication   Communication: No difficulties  Cognition Arousal/Alertness: Awake/alert Behavior During Therapy: WFL for tasks assessed/performed Overall Cognitive Status: Within Functional Limits for tasks assessed                                               Assessment/Plan    PT Assessment Patient needs continued PT services  PT Problem List Decreased activity tolerance;Decreased balance;Decreased  mobility;Decreased knowledge of use of DME;Pain;Impaired sensation       PT Treatment Interventions DME instruction;Gait training;Stair training;Functional mobility training;Therapeutic exercise;Therapeutic activities;Balance training;Patient/family education    PT Goals (Current goals can be found in the Care Plan section)  Acute Rehab PT Goals Patient Stated Goal: to go home as soon as MD will let him PT Goal Formulation: With patient Time For Goal Achievement: 03/07/18 Potential to Achieve Goals: Good    Frequency Min 3X/week           AM-PAC PT "6 Clicks" Daily Activity  Outcome Measure Difficulty turning over in bed (including adjusting bedclothes, sheets and blankets)?: None Difficulty moving from lying on back to sitting on the side of the bed? : None Difficulty sitting down on and standing up from a chair with arms (e.g., wheelchair, bedside commode, etc,.)?: None Help needed moving to and from a bed to chair (including a wheelchair)?: A Little Help needed walking in hospital room?: A Little Help needed climbing 3-5 steps with a railing? : A Little 6 Click Score: 21    End of Session   Activity Tolerance: Patient limited by pain Patient left: in chair;with call bell/phone within reach   PT Visit Diagnosis: Difficulty in walking, not elsewhere classified (R26.2);Pain Pain - Right/Left: Left Pain - part of body: Ankle and joints of foot    Time: 1729-1746 PT Time Calculation (min) (ACUTE ONLY): 17 min   Charges:       Lurena Joinerebecca B. Veora Fonte, PT, DPT [phone]   PT Evaluation $PT Eval Moderate Complexity: 1 Mod     02/21/2018, 5:53 PM

## 2018-02-21 NOTE — Progress Notes (Signed)
PROGRESS NOTE    Ryan Shaw  RUE:[id number] DOB: 01/18/1966 DOA: 02/16/2018 PCP: Hoy RegisterNewlin, Enobong, MD    Brief Narrative: Ryan ClockWilber Laughman is a 52 y.o. male with medical history significant for uncontrolled insulin-dependent diabetes mellitus, chronic kidney disease stage II-III, and chronic foot ulcers status post toe amputations followed by orthopedic surgery, and now presenting to the emergency department for evaluation of subjective fevers, chills, and increasing pain, swelling, drainage, and odor from his left foot wound. He was admitted and orthopedics consulted. He underwent transmetatarsal amputation and plan to start physical therapy. He is non weight bearing on the left lower extremity until the incision heals.     Assessment & Plan:   Principal Problem:   Diabetic foot infection (HCC) Active Problems:   Diabetic peripheral neuropathy (HCC)   Uncontrolled type 2 diabetes mellitus with hyperosmolar nonketotic hyperglycemia (HCC)   Essential hypertension   Amputee, great toe, left (HCC)   Diabetic foot ulcer (HCC)   Hyperlipidemia   Hyponatremia   Chronic renal insufficiency   Worsening diabetic foot ulcer:  On IV vancomycin, rocephin and flagyl day 6 of IV antibiotics. Plan to d/c antibiotics after tomorrow's dose.  X rays do not show any osteomyelitis. MRI of the foot shows  Diffuse myofasciitis and cellulitis and  Degenerative lesion in the third proximal phalanx. Orthopedics consulted and patient underwent   Let transmetatarsal amputation with tissue re arrangement. Recommended non weight bearing on the left lower extremity until the incision heals.   Pain control .  ABI wnl.  Blood cultures have been negative so far.  Wound care consulted and recommendations given.  Lactic acid normalized. Afebrile and no leukocytosis.  Physical therapy consulted .  Social work consulted.    Uncontrolled DM with hyperglycemia: A1c is 10.9.  CBG (last 3)  Recent Labs   02/20/18 1658 02/20/18 2144 02/21/18 0812  GLUCAP 214* 202* 82   Resume SSI for now. Patient is on Lantus 20 units BID at home, but he is non compliant,.  Currently on 20 units of BID lantus. Will add novolog 3 units TIDAC for better control of CBG'S.      Hyponatremia:  Possibly from dehydration. Resolved with hydration.      Acute on Stage 3 CKD:  Suspect from dehydration and possible ATN from infection.  Creatinine improved with hydration. Avoid NSAIDS, ( he was on naproxen at home).  Hold nephrotoxins. Pt on Lisinopril, which was held on admission.  Creatinine stable at this time. Restart the lisinopril.     Mild normocytic anemia:  Hemoglobin at 11.7. It appears this is his baseline.   Hypertension:  Well controlled. Restart lisinopril.   Hyperlipidemia:  Resume lipitor. Outpatient follow up with lipid panel.    Diabetic neuropathy:  Stable. On gabapentin at home which was resumed.     DVT prophylaxis: lovenox.  Code Status: full code.  Family Communication: none at bedside.  Disposition Plan: pending physical therapy evaluation. Possible d/c in the next 24 hours.   Consultants:   Orthopedics Dr Cleophas DunkerWhitfield. , Dr Roda ShuttersXu.   Procedures: MRI FOOT    Antimicrobials: vancomycin and rocephin and flagyl since admission.    Subjective: No new complaints today. Wants to talk to social work.   Objective: Vitals:   02/20/18 2346 02/21/18 0524 02/21/18 0527 02/21/18 0833  BP: 118/69 112/82  115/71  Pulse: 94 84  85  Resp: 17 16  17   Temp: 98.3 F (36.8 C) 98.2 F (36.8 C)  98.5 F (36.9 C)  TempSrc: Oral Oral  Oral  SpO2: 99% 99%  100%  Weight:   111.7 kg (246 lb 4.1 oz)   Height:        Intake/Output Summary (Last 24 hours) at 02/21/2018 1040 Last data filed at 02/21/2018 1610 Gross per 24 hour  Intake 2139.1 ml  Output 900 ml  Net 1239.1 ml   Filed Weights   02/19/18 0650 02/20/18 0509 02/21/18 0527  Weight: 109.7 kg (241 lb 13.5 oz) 110.9 kg  (244 lb 7.8 oz) 111.7 kg (246 lb 4.1 oz)    Examination:  General exam: sitting in the chair, not in distress.  Respiratory system: good air entry.  Cardiovascular system: S1 & S2 heard, RRR. No JVD, Gastrointestinal system: Abdomen is soft non tender non distended bowel sounds good.  Central nervous system: Alert and oriented. Non focal.  Extremities:left foot bandaged. No drainage.  Skin: No rashes, lesions or ulcers Psychiatry:. Mood & affect appropriate.     Data Reviewed: I have personally reviewed following labs and imaging studies  CBC: Recent Labs  Lab 02/16/18 1853 02/18/18 0505 02/21/18 0541  WBC 10.2 7.5 6.7  NEUTROABS 7.5  --   --   HGB 13.6 11.7* 11.6*  HCT 41.9 35.4* 36.9*  MCV 85.2 84.3 86.6  PLT 319 231 291   Basic Metabolic Panel: Recent Labs  Lab 02/17/18 0233 02/18/18 0505 02/19/18 0514 02/20/18 0410 02/21/18 0541  NA 138 136 137 139 139  K 4.1 4.6 4.5 4.5 4.2  CL 104 103 103 103 103  CO2 27 27 29 31 29   GLUCOSE 222* 232* 216* 143* 143*  BUN 17 11 11 12 11   CREATININE 1.35* 1.06 1.13 1.04 1.00  CALCIUM 8.7* 8.4* 8.6* 8.6* 8.4*   GFR: Estimated Creatinine Clearance: 123 mL/min (by C-G formula based on SCr of 1 mg/dL). Liver Function Tests: Recent Labs  Lab 02/16/18 1853  AST 27  ALT 28  ALKPHOS 142*  BILITOT 0.6  PROT 7.4  ALBUMIN 3.6   No results for input(s): LIPASE, AMYLASE in the last 168 hours. No results for input(s): AMMONIA in the last 168 hours. Coagulation Profile: No results for input(s): INR, PROTIME in the last 168 hours. Cardiac Enzymes: No results for input(s): CKTOTAL, CKMB, CKMBINDEX, TROPONINI in the last 168 hours. BNP (last 3 results) No results for input(s): PROBNP in the last 8760 hours. HbA1C: No results for input(s): HGBA1C in the last 72 hours. CBG: Recent Labs  Lab 02/20/18 1054 02/20/18 1217 02/20/18 1658 02/20/18 2144 02/21/18 0812  GLUCAP 123* 130* 214* 202* 82   Lipid Profile: No results  for input(s): CHOL, HDL, LDLCALC, TRIG, CHOLHDL, LDLDIRECT in the last 72 hours. Thyroid Function Tests: No results for input(s): TSH, T4TOTAL, FREET4, T3FREE, THYROIDAB in the last 72 hours. Anemia Panel: No results for input(s): VITAMINB12, FOLATE, FERRITIN, TIBC, IRON, RETICCTPCT in the last 72 hours. Sepsis Labs: Recent Labs  Lab 02/16/18 1901 02/16/18 2139  LATICACIDVEN 2.60* 1.67    Recent Results (from the past 240 hour(s))  Blood culture (routine x 2)     Status: None (Preliminary result)   Collection Time: 02/16/18  8:22 PM  Result Value Ref Range Status   Specimen Description BLOOD RIGHT ANTECUBITAL  Final   Special Requests   Final    BOTTLES DRAWN AEROBIC AND ANAEROBIC Blood Culture adequate volume   Culture   Final    NO GROWTH 4 DAYS Performed at [facility], [address] [place], [place]  [address]    Report Status PENDING  Incomplete  Blood culture (routine x 2)     Status: None (Preliminary result)   Collection Time: 02/16/18  8:25 PM  Result Value Ref Range Status   Specimen Description BLOOD RIGHT UPPER ARM  Final   Special Requests   Final    BOTTLES DRAWN AEROBIC AND ANAEROBIC Blood Culture adequate volume   Culture   Final    NO GROWTH 4 DAYS Performed at [facility], [address] [place], [place] [address]    Report Status PENDING  Incomplete  Surgical pcr screen     Status: None   Collection Time: 02/17/18 12:35 AM  Result Value Ref Range Status   MRSA, PCR NEGATIVE NEGATIVE Final   Staphylococcus aureus NEGATIVE NEGATIVE Final    Comment: (NOTE) The Xpert SA Assay (FDA approved for NASAL specimens in patients 12 years of age and older), is one component of a comprehensive surveillance program. It is not intended to diagnose infection nor to guide or monitor treatment. Performed at [facility], [address], [place], [place] [address]   MRSA PCR Screening     Status: None   Collection Time: 02/19/18  4:46 PM    Result Value Ref Range Status   MRSA by PCR NEGATIVE NEGATIVE Final    Comment:        The GeneXpert MRSA Assay (FDA approved for NASAL specimens only), is one component of a comprehensive MRSA colonization surveillance program. It is not intended to diagnose MRSA infection nor to guide or monitor treatment for MRSA infections. Performed at [facility], [address], [place], [place] [address]          Radiology Studies: Mr Foot Left W Wo Contrast  Result Date: 02/19/2018 CLINICAL DATA:  History of first and second toe amputations with recurrent soft tissue swelling and diabetic foot ulcer. EXAM: MRI OF THE LEFT FOREFOOT WITHOUT AND WITH CONTRAST TECHNIQUE: Multiplanar, multisequence MR imaging of the left forefoot was performed both before and after administration of intravenous contrast. CONTRAST:  20mL MULTIHANCE GADOBENATE DIMEGLUMINE 529 MG/ML IV SOLN COMPARISON:  MRI 11/10/2016 and radiographs 02/16/2018 FINDINGS: Diffuse subcutaneous soft tissue swelling/edema/fluid surrounding the great toe with an open ulcer at the amputation site over the great toe. No discrete drainable soft tissue abscess is identified. Diffuse enhancement after contrast but no focal fluid collection. No definite MR findings to suggest osteomyelitis involving the first metatarsal head or sesamoid bones. The other bony structures are intact without evidence of edema or enhancement. There is an intramedullary lesion in the third proximal phalanx which I do not see on the prior MRI. I think this is probably some type of subchondral cyst or intraosseous ganglion given the degenerative changes at the joint. This may be progressive degenerative changes due to altered mechanics from the first and second toe amputations. No worrisome enhancement to suggest this is a bone abscess/osteomyelitis. Diffuse myofasciitis without definite findings for pyomyositis or septic tenosynovitis. IMPRESSION: 1. Diabetic foot  ulcer involving the soft tissues overlying the head of the first metatarsal but no discrete drainable soft tissue abscess or MR findings to suggest septic arthritis or osteomyelitis. 2. Diffuse myofasciitis and cellulitis. 3. Lesion in the third proximal phalanx is likely degenerative. Electronically Signed   By: Rudie Meyer M.D.   On: 02/19/2018 12:17        Scheduled Meds: . atorvastatin  40 mg Oral q1800  . cyclobenzaprine  10  mg Oral BID  . DULoxetine  60 mg Oral Daily  . enoxaparin (LOVENOX) injection  40 mg Subcutaneous Q24H  . gabapentin  300 mg Oral BID  . insulin aspart  0-15 Units Subcutaneous TID WC  . insulin aspart  0-5 Units Subcutaneous QHS  . insulin glargine  20 Units Subcutaneous QHS  . insulin glargine  20 Units Subcutaneous Daily  . sodium chloride flush  3 mL Intravenous Q12H   Continuous Infusions: . sodium chloride 100 mL/hr at 02/21/18 0052  . cefTRIAXone (ROCEPHIN)  IV 2 g (02/20/18 2206)  . metronidazole 500 mg (02/21/18 0826)  . vancomycin 750 mg (02/20/18 2022)     LOS: 5 days    Time spent: 35 minutes.     Kathlen Mody, MD [facility] Pager [phone]   If 7PM-7AM, please contact night-coverage [email or web address] Password [email or web address] 02/21/2018, 10:40 AM

## 2018-02-22 DIAGNOSIS — E7849 Other hyperlipidemia: Secondary | ICD-10-CM

## 2018-02-22 LAB — GLUCOSE, CAPILLARY: GLUCOSE-CAPILLARY: 135 mg/dL — AB (ref 70–99)

## 2018-02-22 MED ORDER — AMOXICILLIN-POT CLAVULANATE 875-125 MG PO TABS: 1.0000 | ORAL_TABLET | Freq: Two times a day (BID) | ORAL | 0 refills | Status: AC

## 2018-02-22 MED ORDER — FUROSEMIDE 10 MG/ML IJ SOLN
40.0000 mg | Freq: Once | INTRAMUSCULAR | Status: AC
  Administered 2018-02-22: 40 mg via INTRAVENOUS
  Filled 2018-02-22: qty 4

## 2018-02-22 MED ORDER — HYDROCODONE-ACETAMINOPHEN 5-325 MG PO TABS: 1.0000 | ORAL_TABLET | Freq: Four times a day (QID) | ORAL | 0 refills | Status: AC | PRN

## 2018-02-22 MED FILL — AMOX-CLAV 875-125 MG TABLET: 875-125 | 5 days supply | Qty: 10 | Fill #0

## 2018-02-22 NOTE — Progress Notes (Signed)
Pt given d/c instructions and prescription for pain meds.  Antibiotics to be picked up at pts usual pharmacy. Pt spilled coffee on right upper thigh prior to d/c.  Pt states area is uncomfortable but no redness or swelling or open areas noted. IV removed and pt called for transport home.

## 2018-02-22 NOTE — Care Management Note (Signed)
Case Management Note  Patient Details  Name: Ryan Shaw MRN: [id number] Date of Birth: 01/02/1966  Subjective/Objective:            Admitted with diabetic foot ulcer. Hx: diabetes mellitus, chronic kidney disease stage II-III,and chronic foot ulcers status post toe amputations. Resides with daughter, [name]. 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  Pt states has transportation to home.  Expected Discharge Date:  02/22/18               Expected Discharge Plan:  Home/Self Care  In-House Referral:     Discharge planning Services  CM Consult  Post Acute Care Choice:    Choice offered to:  Patient  DME Arranged:  Dan HumphreysWalker rolling DME Agency:  [facility]  HH Arranged:   N/A HH Agency:   N/A  Status of Service:  Completed, signed off  If discussed at Long Length of Stay Meetings, dates discussed:    Additional Comments:  Epifanio LeschesCole, Alean Kromer Hudson, RN 02/22/2018, 10:34 AM

## 2018-02-22 NOTE — Progress Notes (Signed)
CSW received consult that patient wanted to speak with CSW. Patient requested help with his disability application, which he has applied for several times. He stated he has an application in progress but his lawyer quit after the first denial. CSW explained that the hospital refers patient to the [facility] for help with the process. CSW encouraged patient to send the hospital records for this admission to the disability office since he had a surgery that may help qualify him. Patient had no further questions.   CSW signing off.   Osborne Cascoadia Eustolia Drennen LCSW [phone]

## 2018-02-27 ENCOUNTER — Emergency Department (HOSPITAL_COMMUNITY): Payer: Medicaid Other

## 2018-02-27 ENCOUNTER — Emergency Department (HOSPITAL_COMMUNITY)
Admission: EM | Admit: 2018-02-27 | Discharge: 2018-02-27 | Disposition: A | Discharge status: Home / Self Care | Payer: Medicaid Other | Attending: Emergency Medicine | Admitting: Emergency Medicine

## 2018-02-27 ENCOUNTER — Encounter (HOSPITAL_COMMUNITY): Payer: Self-pay | Admitting: Emergency Medicine

## 2018-02-27 ENCOUNTER — Other Ambulatory Visit: Payer: Self-pay

## 2018-02-27 DIAGNOSIS — E119 Type 2 diabetes mellitus without complications: Secondary | ICD-10-CM | POA: Insufficient documentation

## 2018-02-27 DIAGNOSIS — F17210 Nicotine dependence, cigarettes, uncomplicated: Secondary | ICD-10-CM | POA: Insufficient documentation

## 2018-02-27 DIAGNOSIS — I129 Hypertensive chronic kidney disease with stage 1 through stage 4 chronic kidney disease, or unspecified chronic kidney disease: Secondary | ICD-10-CM | POA: Diagnosis not present

## 2018-02-27 DIAGNOSIS — L02611 Cutaneous abscess of right foot: Secondary | ICD-10-CM | POA: Insufficient documentation

## 2018-02-27 DIAGNOSIS — L0291 Cutaneous abscess, unspecified: Secondary | ICD-10-CM

## 2018-02-27 DIAGNOSIS — N189 Chronic kidney disease, unspecified: Secondary | ICD-10-CM | POA: Insufficient documentation

## 2018-02-27 LAB — CBC WITH DIFFERENTIAL/PLATELET
Abs Immature Granulocytes: 0 10*3/uL (ref 0.0–0.1)
BASOS PCT: 1 %
Basophils Absolute: 0.1 10*3/uL (ref 0.0–0.1)
EOS ABS: 0.6 10*3/uL (ref 0.0–0.7)
EOS PCT: 9 %
HEMATOCRIT: 38.7 % — AB (ref 39.0–52.0)
Hemoglobin: 12.1 g/dL — ABNORMAL LOW (ref 13.0–17.0)
Immature Granulocytes: 0 %
LYMPHS ABS: 1.5 10*3/uL (ref 0.7–4.0)
Lymphocytes Relative: 24 %
MCH: 27.3 pg (ref 26.0–34.0)
MCHC: 31.3 g/dL (ref 30.0–36.0)
MCV: 87.2 fL (ref 78.0–100.0)
Monocytes Absolute: 0.5 10*3/uL (ref 0.1–1.0)
Monocytes Relative: 8 %
Neutro Abs: 3.7 10*3/uL (ref 1.7–7.7)
Neutrophils Relative %: 58 %
PLATELETS: 402 10*3/uL — AB (ref 150–400)
RBC: 4.44 MIL/uL (ref 4.22–5.81)
RDW: 12.3 % (ref 11.5–15.5)
WBC: 6.4 10*3/uL (ref 4.0–10.5)

## 2018-02-27 LAB — COMPREHENSIVE METABOLIC PANEL
ALBUMIN: 2.8 g/dL — AB (ref 3.5–5.0)
ALT: 25 U/L (ref 0–44)
ANION GAP: 6 (ref 5–15)
AST: 22 U/L (ref 15–41)
Alkaline Phosphatase: 120 U/L (ref 38–126)
BILIRUBIN TOTAL: 0.5 mg/dL (ref 0.3–1.2)
BUN: 9 mg/dL (ref 6–20)
CHLORIDE: 104 mmol/L (ref 98–111)
CO2: 29 mmol/L (ref 22–32)
Calcium: 9 mg/dL (ref 8.9–10.3)
Creatinine, Ser: 1.01 mg/dL (ref 0.61–1.24)
GFR calc Af Amer: >60 mL/min (ref >60)
Glucose, Bld: 209 mg/dL — ABNORMAL HIGH (ref 70–99)
POTASSIUM: 4.6 mmol/L (ref 3.5–5.1)
Sodium: 139 mmol/L (ref 135–145)
TOTAL PROTEIN: 7 g/dL (ref 6.5–8.1)

## 2018-02-27 MED ORDER — OXYCODONE-ACETAMINOPHEN 5-325 MG PO TABS: 1.0000 | ORAL_TABLET | Freq: Three times a day (TID) | ORAL | 0 refills | Status: AC | PRN

## 2018-02-27 MED ORDER — CLINDAMYCIN HCL 150 MG PO CAPS
300.0000 mg | ORAL_CAPSULE | Freq: Once | ORAL | Status: AC
  Administered 2018-02-27: 300 mg via ORAL
  Filled 2018-02-27: qty 2

## 2018-02-27 MED ORDER — CLINDAMYCIN HCL 300 MG PO CAPS: 300.0000 mg | ORAL_CAPSULE | Freq: Four times a day (QID) | ORAL | 0 refills | Status: AC

## 2018-02-27 MED ORDER — OXYCODONE-ACETAMINOPHEN 5-325 MG PO TABS
1.0000 | ORAL_TABLET | Freq: Once | ORAL | Status: AC
  Administered 2018-02-27: 1 via ORAL
  Filled 2018-02-27: qty 1

## 2018-02-27 NOTE — ED Notes (Signed)
Patient given discharge instructions and verbalized understanding.  Patient stable to discharge at this time.  Patient is alert and oriented to baseline.  No distressed noted at this time.  All belongings taken with the patient at discharge.   

## 2018-02-27 NOTE — Discharge Summary (Addendum)
Physician Discharge Summary  Ryan Shaw MGN:[id number] DOB: 04-17-1966 DOA: 02/16/2018  PCP: Charlott Rakes, MD  Admit date: 02/16/2018 Discharge date: 02/22/2018  Admitted From: Home.  Disposition:  Home.   Recommendations for Outpatient Follow-up:  1. Follow up with PCP in 1-2 weeks 2. Please obtain BMP/CBC in one week Please follow up with orthopedics as recommended.   Discharge Condition: stable.  CODE STATUS: full code.  Diet recommendation: Heart Healthy   Brief/Interim Summary: Ryan Johnsonis a 52 y.o.malewith medical history significant foruncontrolled insulin-dependent diabetes mellitus, chronic kidney disease stage II-III,and chronic foot ulcers status post toe amputations followed by orthopedic surgery, and now presenting to the emergency department for evaluation of subjective fevers, chills, and increasing pain, swelling, drainage, and odor from his left foot wound. He was admitted and orthopedics consulted. He underwent transmetatarsal amputation and plan to start physical therapy. He is non weight bearing on the left lower extremity until the incision heals.     Discharge Diagnoses:  Principal Problem:   Diabetic foot infection ([place]) Active Problems:   Diabetic peripheral neuropathy ([place])   Uncontrolled type 2 diabetes mellitus with hyperosmolar nonketotic hyperglycemia ([facility])   Essential hypertension   Amputee, great toe, left ([facility])   Diabetic foot ulcer ([place])   Hyperlipidemia   Hyponatremia   Chronic renal insufficiency  Worsening diabetic foot ulcer:  On IV vancomycin, rocephin and flagyl day 6 of IV antibiotics. Plan to d/c antibiotics after tomorrow's dose.  X rays do not show any osteomyelitis. MRI of the foot shows  Diffuse myofasciitis and cellulitis and  Degenerative lesion in the third proximal phalanx. Orthopedics consulted and patient underwent   Let transmetatarsal amputation with tissue re arrangement. Recommended non weight bearing on the  left lower extremity until the incision heals.   Pain control .  ABI wnl.  Blood cultures have been negative so far.  Wound care consulted and recommendations given.  Lactic acid normalized. Afebrile and no leukocytosis.  Physical therapy consulted .  Social work consulted.    Uncontrolled DM with hyperglycemia: A1c is 10.9.  Resume LANTUS BID and resume SSI.   Sepsis ruled out.   Hyponatremia:  Possibly from dehydration. Resolved with hydration.      Acute on Stage 3 CKD:  Suspect from dehydration and possible ATN from infection.  Creatinine improved with hydration. Avoid NSAIDS, ( he was on naproxen at home).  Hold nephrotoxins. Pt on Lisinopril, which was held on admission.  Creatinine stable at this time. Restarted the lisinopril.     Mild normocytic anemia:  Hemoglobin at 11.7. It appears this is his baseline.   Hypertension:  Well controlled. Restarted lisinopril.   Hyperlipidemia:  Resume lipitor. Outpatient follow up with lipid panel.    Diabetic neuropathy:  Stable. On gabapentin at home which was resumed.       Discharge Instructions  Discharge Instructions    Diet - low sodium heart healthy   Complete by:  As directed    Discharge instructions   Complete by:  As directed    Please follow up with Dr Erlinda Hong as recommended in one week.  Please follow up with PCP in one week.     Allergies as of 02/22/2018      Reactions   Other Nausea And Vomiting   NeuRemedy - Dietary Supplement   Vancomycin Other (See Comments)   Given after surgery in January 2018 at [facility] - shut kidneys down per pt      Medication  List    STOP taking these medications   naproxen 500 MG tablet Commonly known as:  NAPROSYN   pregabalin 100 MG capsule Commonly known as:  LYRICA     TAKE these medications   ACCU-CHEK AVIVA PLUS w/Device Kit Use as directed   ACCU-CHEK SOFTCLIX LANCETS lancets USE AS INSTRUCTED    amoxicillin-clavulanate 875-125 MG tablet Commonly known as:  AUGMENTIN Take 1 tablet by mouth every 12 (twelve) hours for 5 days.   atorvastatin 40 MG tablet Commonly known as:  LIPITOR Take 1 tablet (40 mg total) by mouth at bedtime.   cyclobenzaprine 10 MG tablet Commonly known as:  FLEXERIL TAKE 1 TABLET BY MOUTH 2 TIMES DAILY AS NEEDED FOR MUSCLE SPASMS. Notes to patient:  Dose this am   DULoxetine 60 MG capsule Commonly known as:  CYMBALTA Take 1 capsule (60 mg total) by mouth daily.   gabapentin 300 MG capsule Commonly known as:  NEURONTIN Take 1 capsule (300 mg total) by mouth 2 (two) times daily. Stop once Lyrica arrives   glucose blood test strip Commonly known as:  ACCU-CHEK AVIVA PLUS Use as instructed   insulin aspart 100 UNIT/ML FlexPen Commonly known as:  NOVOLOG Inject 10 Units into the skin 3 (three) times daily with meals. What changed:  how much to take   Insulin Glargine 100 UNIT/ML Solostar Pen Commonly known as:  LANTUS Inject 30 Units into the skin 2 (two) times daily.   Insulin Syringes (Disposable) U-100 1 ML Misc 1 each by Does not apply route 3 (three) times daily.   lisinopril 5 MG tablet Commonly known as:  PRINIVIL,ZESTRIL Take 1 tablet (5 mg total) by mouth at bedtime.   Pen Needles 31G X 5 MM Misc 10 Units by Does not apply route 3 (three) times daily.   polyethylene glycol powder powder Commonly known as:  GLYCOLAX/MIRALAX Take 17 g by mouth 2 (two) times daily as needed. What changed:  reasons to take this   sildenafil 25 MG tablet Commonly known as:  VIAGRA Take 1 tablet (25 mg total) by mouth daily as needed for erectile dysfunction.     ASK your doctor about these medications   HYDROcodone-acetaminophen 5-325 MG tablet Commonly known as:  NORCO/VICODIN Take 1-2 tablets by mouth every 6 (six) hours as needed for up to 3 days for moderate pain. Ask about: Should I take this medication?      Follow-up Information     [place]. Go on 03/16/2018.   Why:  1:50 pm  Contact information: [address] [place] [address] [phone]       Leandrew Koyanagi, MD In 1 week.   Specialty:  Orthopedic Surgery Why:  For wound re-check Contact information: [place] [address] [place] Follow up.   Why:  rolling walker will be delivered to bedside prior to discharge Contact information: [place] [address] [phone]          Allergies  Allergen Reactions  . Other Nausea And Vomiting    NeuRemedy - Dietary Supplement  . Vancomycin Other (See Comments)    Given after surgery in January 2018 at [facility] - shut kidneys down per pt    Consultations:  orthopedics   Procedures/Studies: Dg Eye Foreign Body  Result Date: 02/19/2018 CLINICAL DATA:  Metal working/exposure; clearance prior to  MRI EXAM: ORBITS FOR FOREIGN BODY - 2 VIEW COMPARISON:  None. FINDINGS: There is no evidence of metallic foreign body within the orbits. No significant bone abnormality identified. IMPRESSION: No evidence of metallic foreign body within the orbits. Electronically Signed   By: Titus Dubin M.D.   On: 02/19/2018 10:38   Dg Chest 2 View  Result Date: 02/16/2018 CLINICAL DATA:  Initial evaluation for acute cough, sepsis. EXAM: CHEST - 2 VIEW COMPARISON:  Prior radiograph from 11/28/2016. FINDINGS: Cardiac and mediastinal silhouette stable in size and contour, and remain within normal limits. Lungs hypoinflated. Hazy bibasilar opacities favored to reflect atelectasis and/or bronchovascular crowding, although possible mild infiltrates not excluded. No pulmonary edema or pleural effusion. No pneumothorax. No acute osseous abnormality. IMPRESSION: Low lung volumes with hazy bibasilar opacities. Atelectasis/bronchovascular crowding is favored.  Possible infiltrates not excluded, and could be considered in the correct clinical setting. Electronically Signed   By: Jeannine Boga M.D.   On: 02/16/2018 22:13   Mr Foot Left W Wo Contrast  Result Date: 02/19/2018 CLINICAL DATA:  History of first and second toe amputations with recurrent soft tissue swelling and diabetic foot ulcer. EXAM: MRI OF THE LEFT FOREFOOT WITHOUT AND WITH CONTRAST TECHNIQUE: Multiplanar, multisequence MR imaging of the left forefoot was performed both before and after administration of intravenous contrast. CONTRAST:  24m MULTIHANCE GADOBENATE DIMEGLUMINE 529 MG/ML IV SOLN COMPARISON:  MRI 11/10/2016 and radiographs 02/16/2018 FINDINGS: Diffuse subcutaneous soft tissue swelling/edema/fluid surrounding the great toe with an open ulcer at the amputation site over the great toe. No discrete drainable soft tissue abscess is identified. Diffuse enhancement after contrast but no focal fluid collection. No definite MR findings to suggest osteomyelitis involving the first metatarsal head or sesamoid bones. The other bony structures are intact without evidence of edema or enhancement. There is an intramedullary lesion in the third proximal phalanx which I do not see on the prior MRI. I think this is probably some type of subchondral cyst or intraosseous ganglion given the degenerative changes at the joint. This may be progressive degenerative changes due to altered mechanics from the first and second toe amputations. No worrisome enhancement to suggest this is a bone abscess/osteomyelitis. Diffuse myofasciitis without definite findings for pyomyositis or septic tenosynovitis. IMPRESSION: 1. Diabetic foot ulcer involving the soft tissues overlying the head of the first metatarsal but no discrete drainable soft tissue abscess or MR findings to suggest septic arthritis or osteomyelitis. 2. Diffuse myofasciitis and cellulitis. 3. Lesion in the third proximal phalanx is likely degenerative.  Electronically Signed   By: PMarijo SanesM.D.   On: 02/19/2018 12:17   Dg Foot Complete Left  Result Date: 02/16/2018 CLINICAL DATA:  Distal left foot wound located at site of toe amputations. EXAM: LEFT FOOT - COMPLETE 3+ VIEW COMPARISON:  11/27/2017 FINDINGS: Amputation of the first and second toes at the MTP joints with soft tissue ulceration involving the stump overlying the first metatarsal head. No frank bone destruction or bony sequestrum is identified. Chronic sclerosis of the third proximal phalanx is stable and may reflect stigmata of chronic osteomyelitis or healed fracture. No new fracture or suspicious osseous lesions. IMPRESSION: 1. Amputation at the first and second metatarsal phalangeal joints are redemonstrated. 2. More prominent soft tissue ulceration involving the soft tissues overlying the first metatarsal head but without frank bone destruction nor cortical bone loss to suggest acute osteomyelitis. 3. Chronic sclerosis of the third proximal phalanx is unchanged and may reflect stigmata of chronic osteomyelitis or  healed fracture. Electronically Signed   By: Ashley Royalty M.D.   On: 02/16/2018 19:49   Dg Foot Complete Right  Result Date: 02/27/2018 CLINICAL DATA:  Foot ulcer along the fifth MTP joint area. EXAM: RIGHT FOOT COMPLETE - 3+ VIEW COMPARISON:  02/16/2018 FINDINGS: Evidence of remote second toe amputation. The other joint spaces are maintained. No acute bony findings or destructive bony changes. Small wound suspected along the lateral aspect of the proximal fifth phalanx. No definite findings for septic arthritis or osteomyelitis. Small calcaneal heel spur. IMPRESSION: No acute bony findings or destructive bony changes. Electronically Signed   By: Marijo Sanes M.D.   On: 02/27/2018 17:00   Dg Foot Complete Right  Result Date: 02/16/2018 CLINICAL DATA:  Initial evaluation for wound to right foot. EXAM: RIGHT FOOT COMPLETE - 3+ VIEW COMPARISON:  Prior radiograph from  10/27/2016 FINDINGS: Patient status post amputation of the right second ray. No acute fracture or dislocation. No evidence for osteomyelitis. Calcaneal enthesophyte noted. Degenerative changes at the first MTP joint. Pes planus foot deformity. No soft tissue abnormality. IMPRESSION: 1. No acute osseous abnormality about the foot. 2. Prior amputation at the right second ray. Electronically Signed   By: Jeannine Boga M.D.   On: 02/16/2018 22:15       Subjective: No new complaints.  Discharge Exam: Vitals:   02/22/18 0321 02/22/18 0730  BP: 134/88 137/64  Pulse: 83 82  Resp: 16 18  Temp: 98 F (36.7 C) 98 F (36.7 C)  SpO2: 100% 99%   Vitals:   02/22/18 0010 02/22/18 0321 02/22/18 0500 02/22/18 0730  BP: 125/72 134/88  137/64  Pulse: 84 83  82  Resp: 16 16  18   Temp: 98.7 F (37.1 C) 98 F (36.7 C)  98 F (36.7 C)  TempSrc: Oral Oral  Oral  SpO2: 99% 100%  99%  Weight:   114.3 kg (251 lb 15.8 oz)   Height:        General: Pt is alert, awake, not in acute distress Cardiovascular: RRR, S1/S2 +, no rubs, no gallops Respiratory: CTA bilaterally, no wheezing, no rhonchi Abdominal: Soft, NT, ND, bowel sounds + Extremities: no edema, no cyanosis    The results of significant diagnostics from this hospitalization (including imaging, microbiology, ancillary and laboratory) are listed below for reference.     Microbiology: Recent Results (from the past 240 hour(s))  MRSA PCR Screening     Status: None   Collection Time: 02/19/18  4:46 PM  Result Value Ref Range Status   MRSA by PCR NEGATIVE NEGATIVE Final    Comment:        The GeneXpert MRSA Assay (FDA approved for NASAL specimens only), is one component of a comprehensive MRSA colonization surveillance program. It is not intended to diagnose MRSA infection nor to guide or monitor treatment for MRSA infections. Performed at [facility], [place] [address], [place], [place] [address]      Labs: BNP  (last 3 results) No results for input(s): BNP in the last 8760 hours. Basic Metabolic Panel: Recent Labs  Lab 02/21/18 0541 02/27/18 1630  NA 139 139  K 4.2 4.6  CL 103 104  CO2 29 29  GLUCOSE 143* 209*  BUN 11 9  CREATININE 1.00 1.01  CALCIUM 8.4* 9.0   Liver Function Tests: Recent Labs  Lab 02/27/18 1630  AST 22  ALT 25  ALKPHOS 120  BILITOT 0.5  PROT 7.0  ALBUMIN 2.8*   No  results for input(s): LIPASE, AMYLASE in the last 168 hours. No results for input(s): AMMONIA in the last 168 hours. CBC: Recent Labs  Lab 02/21/18 0541 02/27/18 1630  WBC 6.7 6.4  NEUTROABS  --  3.7  HGB 11.6* 12.1*  HCT 36.9* 38.7*  MCV 86.6 87.2  PLT 291 402*   Cardiac Enzymes: No results for input(s): CKTOTAL, CKMB, CKMBINDEX, TROPONINI in the last 168 hours. BNP: Invalid input(s): POCBNP CBG: Recent Labs  Lab 02/21/18 0812 02/21/18 1213 02/21/18 1722 02/21/18 2049 02/22/18 0826  GLUCAP 82 210* 152* 171* 135*   D-Dimer No results for input(s): DDIMER in the last 72 hours. Hgb A1c No results for input(s): HGBA1C in the last 72 hours. Lipid Profile No results for input(s): CHOL, HDL, LDLCALC, TRIG, CHOLHDL, LDLDIRECT in the last 72 hours. Thyroid function studies No results for input(s): TSH, T4TOTAL, T3FREE, THYROIDAB in the last 72 hours.  Invalid input(s): FREET3 Anemia work up No results for input(s): VITAMINB12, FOLATE, FERRITIN, TIBC, IRON, RETICCTPCT in the last 72 hours. Urinalysis    Component Value Date/Time   COLORURINE STRAW (A) 02/16/2018 2302   APPEARANCEUR CLEAR 02/16/2018 2302   LABSPEC 1.029 02/16/2018 2302   PHURINE 6.0 02/16/2018 2302   GLUCOSEU >=500 (A) 02/16/2018 2302   HGBUR SMALL (A) 02/16/2018 2302   BILIRUBINUR NEGATIVE 02/16/2018 2302   BILIRUBINUR neg 10/24/2014 1152   KETONESUR NEGATIVE 02/16/2018 2302   PROTEINUR NEGATIVE 02/16/2018 2302   UROBILINOGEN 0.2 03/30/2015 2059   NITRITE NEGATIVE 02/16/2018 2302   LEUKOCYTESUR NEGATIVE  02/16/2018 2302   Sepsis Labs Invalid input(s): PROCALCITONIN,  WBC,  LACTICIDVEN Microbiology Recent Results (from the past 240 hour(s))  MRSA PCR Screening     Status: None   Collection Time: 02/19/18  4:46 PM  Result Value Ref Range Status   MRSA by PCR NEGATIVE NEGATIVE Final    Comment:        The GeneXpert MRSA Assay (FDA approved for NASAL specimens only), is one component of a comprehensive MRSA colonization surveillance program. It is not intended to diagnose MRSA infection nor to guide or monitor treatment for MRSA infections. Performed at [facility], [place] [address], [place], [place] [address]      Time coordinating discharge:35  minutes  SIGNED:   Hosie Poisson, MD  [facility] 02/27/2018, 11:03 PM Pager   If 7PM-7AM, please contact night-coverage [email or web address] Password [email or web address]

## 2018-02-27 NOTE — ED Provider Notes (Signed)
Emergency Department Provider Note   I have reviewed the triage vital signs and the nursing notes.   HISTORY  Chief Complaint Foot Pain and Foot Swelling   HPI Ryan Shaw is a 52 y.o. male with a history of diabetes, chronic kidney disease, hypertension who presents to the emergency department today secondary to pain swelling and drainage on his right foot over his first metatarsal.  States he was recently admitted to hospital for a left transmetatarsal amputation.  This is started a couple days ago.  Has had some pain and swelling area as well.  Is off of his antibiotics.  No systemic symptoms such as nausea, vomiting, fever or weakness. No other associated or modifying symptoms.    Past Medical History:  Diagnosis Date  . Arthritis   . CKD (chronic kidney disease)   . Diabetes mellitus without complication (HCC)   . Edema   . Hypercholesteremia   . Hypertension   . Neuropathy   . Ulcer of foot due to diabetes (HCC) 01/2018   left foot    Patient Active Problem List   Diagnosis Date Noted  . Hyponatremia 02/16/2018  . Chronic renal insufficiency 02/16/2018  . Diabetic foot infection (HCC) 02/16/2018  . Anxiety and depression 01/04/2017  . H/O amputation of lesser toe, left (HCC) 12/25/2016  . Hyperlipidemia 12/15/2016  . MRSA infection   . Abscess of bursa of right hip   . Cellulitis of left lower extremity   . Fever   . Right hip pain   . Acute right ankle pain   . Idiopathic chronic gout of left ankle without tophus   . Amputee, great toe, left (HCC) 10/08/2016  . Diabetic foot ulcer (HCC) 10/08/2016  . 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 Acquired contracture of Achilles tendon, left   . Essential hypertension 10/02/2016  . Bilateral chronic knee pain 07/09/2015  . Uncontrolled type 2 diabetes mellitus with hyperosmolar nonketotic hyperglycemia (HCC) 11/01/2014  . Diabetic peripheral neuropathy (HCC) 10/25/2014    Past Surgical  History:  Procedure Laterality Date  . AMPUTATION Right 02/17/2015   Procedure: SECOND RAY AMPUTATION;  Surgeon: Kathryne Hitchhristopher Y Blackman, MD;  [facility];  Service: Orthopedics;  Laterality: Right;  . AMPUTATION Left 10/04/2016   Procedure: GREAT TOE AMPUTATION GASTROCNEMIA RESECTION;  Surgeon: Nadara MustardMarcus Duda V, MD;  Location: [facility] OR;  Service: Orthopedics;  Laterality: Left;  . AMPUTATION Left 12/16/2016   Procedure: LEFT FOOT 2ND TOE AMPUTATION;  Surgeon: Nadara Mustarduda, Marcus V, MD;  [facility];  Service: Orthopedics;  Laterality: Left;  . AMPUTATION Left 02/20/2018   Procedure: transmetatarsal amputation left foot;  Surgeon: Tarry KosXu, Naiping M, MD;  Location: [facility] OR;  Service: Orthopedics;  Laterality: Left;  . HIP SURGERY Right   . INCISION AND DRAINAGE HIP Right 11/12/2016   Procedure: IRRIGATION AND DEBRIDEMENT RIGHT HIP;  Surgeon: Nadara MustardMarcus Duda V, MD;  Location: [facility] OR;  Service: Orthopedics;  Laterality: Right;    Current Outpatient Rx  . Order #: [id number] Class: Normal  . Order #: [id number] Class: Normal  . Order #: [id number] Class: Normal  . Order #: [id number] Class: Normal  . Order #: [id number] Class: Normal  . Order #: [id number] Class: Normal  . Order #: [id number] Class: Normal  . Order #: [id number] Class: Normal  . Order #: [id number] Class: Normal  . Order #: [id number] Class: Print  . Order #: [id number] Class: Normal  . Order #: [id number] Class: Normal  . Order #: [id number] Class: Print  . Order #: [id number] Class: Normal  .  Order #: [id number] Class: Normal  . Order #: [id number] Class: Normal  . Order #: [id number] Class: Print    Allergies Other and Vancomycin  Family History  Problem Relation Age of Onset  . Cancer Mother   . Aneurysm Father        brain    Social History Social History   Tobacco Use  . Smoking status: Current Every Day Smoker    Packs/day: 0.10    Types: Cigarettes  . Smokeless tobacco: Never Used  . Tobacco comment: 3 cigs daily  Substance Use Topics  . Alcohol  use: No    Comment: patient states he quit drinking since he has been sick  . Drug use: No    Comment: 2 weeks    Review of Systems  All other systems negative except as documented in the HPI. All pertinent positives and negatives as reviewed in the HPI. ____________________________________________   PHYSICAL EXAM:  VITAL SIGNS: ED Triage Vitals  Enc Vitals Group     BP 02/27/18 1358 111/88     Pulse Rate 02/27/18 1358 93     Resp 02/27/18 1358 14     Temp 02/27/18 1358 98.5 F (36.9 C)     Temp Source 02/27/18 1358 Oral     SpO2 02/27/18 1358 100 %     Weight 02/27/18 1404 251 lb (113.9 kg)     Height 02/27/18 1404 6\' 6"  (1.981 m)    Constitutional: Alert and oriented. Well appearing and in no acute distress. Eyes: Conjunctivae are normal. PERRL. EOMI. Head: Atraumatic. Nose: No congestion/rhinnorhea. Mouth/Throat: Mucous membranes are moist.  Oropharynx non-erythematous. Neck: No stridor.  No meningeal signs.   Cardiovascular: Normal rate, regular rhythm. Good peripheral circulation. Grossly normal heart sounds.   Respiratory: Normal respiratory effort.  No retractions. Lungs CTAB. Gastrointestinal: Soft and nontender. No distention.  Musculoskeletal: No lower extremity tenderness nor edema. No gross deformities of extremities. Neurologic:  Normal speech and language. No gross focal neurologic deficits are appreciated.  Skin:  Skin is warm, dry and intact. No rash noted. Right foot with small draining ulcer on dorsal surface with warmth, erythema and surrounding edema.  ____________________________________________   LABS (all labs ordered are listed, but only abnormal results are displayed)  Labs Reviewed  CBC WITH DIFFERENTIAL/PLATELET - Abnormal; Notable for the following components:      Result Value   Hemoglobin 12.1 (*)    HCT 38.7 (*)    Platelets 402 (*)    All other components within normal limits  COMPREHENSIVE METABOLIC PANEL - Abnormal; Notable for  the following components:   Glucose, Bld 209 (*)    Albumin 2.8 (*)    All other components within normal limits   ____________________________________________  RADIOLOGY  Dg Foot Complete Right  Result Date: 02/27/2018 CLINICAL DATA:  Foot ulcer along the fifth MTP joint area. EXAM: RIGHT FOOT COMPLETE - 3+ VIEW COMPARISON:  02/16/2018 FINDINGS: Evidence of remote second toe amputation. The other joint spaces are maintained. No acute bony findings or destructive bony changes. Small wound suspected along the lateral aspect of the proximal fifth phalanx. No definite findings for septic arthritis or osteomyelitis. Small calcaneal heel spur. IMPRESSION: No acute bony findings or destructive bony changes. Electronically Signed   By: Rudie Meyer M.D.   On: 02/27/2018 17:00   ____________________________________________   INITIAL IMPRESSION / ASSESSMENT AND PLAN / ED COURSE  Suspect likely abscess and cellulitis.  Will treat for same.  Evidence of sepsis.  At this time  I feel patient is stable for discharge home but will return if symptoms not improving.     Pertinent labs & imaging results that were available during my care of the patient were reviewed by me and considered in my medical decision making (see chart for details).  ____________________________________________  FINAL CLINICAL IMPRESSION(S) / ED DIAGNOSES  Final diagnoses:  Abscess     MEDICATIONS GIVEN DURING THIS VISIT:  Medications  clindamycin (CLEOCIN) capsule 300 mg (300 mg Oral Given 02/27/18 1852)  oxyCODONE-acetaminophen (PERCOCET/ROXICET) 5-325 MG per tablet 1 tablet (1 tablet Oral Given 02/27/18 1852)     NEW OUTPATIENT MEDICATIONS STARTED DURING THIS VISIT:  Discharge Medication List as of 02/27/2018  6:37 PM    START taking these medications   Details  clindamycin (CLEOCIN) 300 MG capsule Take 1 capsule (300 mg total) by mouth 4 (four) times daily. X 7 days, Starting Sun 02/27/2018, Print      oxyCODONE-acetaminophen (PERCOCET) 5-325 MG tablet Take 1-2 tablets by mouth every 8 (eight) hours as needed for severe pain., Starting Sun 02/27/2018, Print        Note:  This note was prepared with assistance of Dragon voice recognition software. Occasional wrong-word or sound-a-like substitutions may have occurred due to the inherent limitations of voice recognition software.   Marily Memos, MD 02/27/18 [phone]

## 2018-02-27 NOTE — ED Notes (Signed)
Pt given sandwich and a sprite zero per Chris-RN and Mesner-MD.

## 2018-02-27 NOTE — ED Triage Notes (Signed)
Pt. Stated, Im having rt. Foot swelling and pain. I had a blister and it popped and all kinds of stuff coming out of it. Blood, pus, green stuff. I was in hospital for an amputation of some of the other foot.

## 2018-02-27 NOTE — ED Notes (Signed)
Pt left for xray

## 2018-03-01 ENCOUNTER — Ambulatory Visit (INDEPENDENT_AMBULATORY_CARE_PROVIDER_SITE_OTHER): Payer: Medicaid Other | Admitting: Orthopaedic Surgery

## 2018-03-01 ENCOUNTER — Encounter (INDEPENDENT_AMBULATORY_CARE_PROVIDER_SITE_OTHER): Payer: Self-pay | Admitting: Orthopaedic Surgery

## 2018-03-01 DIAGNOSIS — M869 Osteomyelitis, unspecified: Secondary | ICD-10-CM

## 2018-03-01 DIAGNOSIS — E1169 Type 2 diabetes mellitus with other specified complication: Secondary | ICD-10-CM

## 2018-03-01 MED ORDER — MUPIROCIN 2 % EX OINT: 1.0000 "application " | TOPICAL_OINTMENT | Freq: Two times a day (BID) | CUTANEOUS | 0 refills | Status: AC

## 2018-03-01 MED ORDER — OXYCODONE-ACETAMINOPHEN 5-325 MG PO TABS: 1.0000 | ORAL_TABLET | ORAL | 0 refills | Status: AC | PRN

## 2018-03-01 MED FILL — ATORVASTATIN CALCIUM 40 MG: 40 | 30 days supply | Qty: 30 | Fill #3

## 2018-03-01 MED FILL — TRUEPLUS PEN NDL 31GX3/16: 31G X 5 MM | 30 days supply | Qty: 100 | Fill #1

## 2018-03-01 MED FILL — TRUEPLUS PEN NDL 31GX3/16": 31G X 5 MM | 30 days supply | Qty: 100 | Fill #1

## 2018-03-01 MED FILL — LANTUS SOLOSTAR 100 UNITS/M: 100 | 25 days supply | Qty: 15 | Fill #4

## 2018-03-01 MED FILL — NOVOLOG FLEXPEN SYRINGE: 100 | 30 days supply | Qty: 15 | Fill #3

## 2018-03-01 MED FILL — GABAPENTIN 300 MG CAPSULE: 300 | 30 days supply | Qty: 60 | Fill #3

## 2018-03-01 MED FILL — ACCU-CHEK SOFTCLIX LANCETS: 30 days supply | Qty: 100 | Fill #0

## 2018-03-01 MED FILL — MUPIROCIN 2% OINTMENT: 2 | 10 days supply | Qty: 22 | Fill #0

## 2018-03-01 MED FILL — CYCLOBENZAPRINE 10 MG TAB: 10 | 25 days supply | Qty: 50 | Fill #0

## 2018-03-01 MED FILL — DULoxetine HCL 60 MG CPEP: 60 | 30 days supply | Qty: 30 | Fill #1

## 2018-03-01 NOTE — Progress Notes (Signed)
Post-Op Visit Note   Patient: Ryan Shaw           Date of Birth: 10/01/1965           MRN: [id number] Visit Date: 03/01/2018 PCP: Hoy RegisterNewlin, Enobong, MD   Assessment & Plan:  Chief Complaint:  Chief Complaint  Patient presents with  . Left Foot - Routine Post Op   Visit Diagnoses:  1. 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  He is doing well.  Pain is well controlled.  Surgical incision is clean dry and evidence of infection or dehiscence.  He is currently wearing a Darco shoe when he needs to ambulate.  His swelling is mild.  At this point we will have him come back in another 2 weeks for another wound recheck.  Bactroban ointment to the incision twice a day.  Percocet was refilled.  Elevate at all times.  Follow-Up Instructions: Return in about 2 weeks (around 03/15/2018).   Orders:  No orders of the defined types were placed in this encounter.  Meds ordered this encounter  Medications  . oxyCODONE-acetaminophen (PERCOCET) 5-325 MG tablet    Sig: Take 1 tablet by mouth every 4 (four) hours as needed for severe pain.    Dispense:  30 tablet    Refill:  0  . mupirocin ointment (BACTROBAN) 2 %    Sig: Apply 1 application topically 2 (two) times daily.    Dispense:  22 g    Refill:  0    Imaging: No results found.  PMFS History: Patient Active Problem List   Diagnosis Date Noted  . Hyponatremia 02/16/2018  . Chronic renal insufficiency 02/16/2018  . Diabetic foot infection (HCC) 02/16/2018  . Anxiety and depression 01/04/2017  . H/O amputation of lesser toe, left (HCC) 12/25/2016  . Hyperlipidemia 12/15/2016  . MRSA infection   . Abscess of bursa of right hip   . Cellulitis of left lower extremity   . Fever   . Right hip pain   . Acute right ankle pain   . Idiopathic chronic gout of left ankle without tophus   . Amputee, great toe, left (HCC) 10/08/2016  . Diabetic foot  ulcer (HCC) 10/08/2016  . 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 Acquired contracture of Achilles tendon, left   . Essential hypertension 10/02/2016  . Bilateral chronic knee pain 07/09/2015  . Uncontrolled type 2 diabetes mellitus with hyperosmolar nonketotic hyperglycemia (HCC) 11/01/2014  . Diabetic peripheral neuropathy (HCC) 10/25/2014   Past Medical History:  Diagnosis Date  . Arthritis   . CKD (chronic kidney disease)   . Diabetes mellitus without complication (HCC)   . Edema   . Hypercholesteremia   . Hypertension   . Neuropathy   . Ulcer of foot due to diabetes (HCC) 01/2018   left foot    Family History  Problem Relation Age of Onset  . Cancer Mother   . Aneurysm Father        brain    Past Surgical History:  Procedure Laterality Date  . AMPUTATION Right 02/17/2015   Procedure: SECOND RAY AMPUTATION;  Surgeon: Kathryne Hitchhristopher Y Blackman, MD;  [facility];  Service: Orthopedics;  Laterality: Right;  . AMPUTATION Left 10/04/2016   Procedure: GREAT TOE AMPUTATION GASTROCNEMIA RESECTION;  Surgeon: Nadara MustardMarcus Duda V, MD;  Location: [facility] OR;  Service: Orthopedics;  Laterality: Left;  . AMPUTATION Left  12/16/2016   Procedure: LEFT FOOT 2ND TOE AMPUTATION;  Surgeon: Nadara Mustard, MD;  Location: [facility] OR;  Service: Orthopedics;  Laterality: Left;  . AMPUTATION Left 02/20/2018   Procedure: transmetatarsal amputation left foot;  Surgeon: Tarry Kos, MD;  Location: [facility] OR;  Service: Orthopedics;  Laterality: Left;  . HIP SURGERY Right   . INCISION AND DRAINAGE HIP Right 11/12/2016   Procedure: IRRIGATION AND DEBRIDEMENT RIGHT HIP;  Surgeon: Nadara Mustard, MD;  Location: [facility] OR;  Service: Orthopedics;  Laterality: Right;   Social History   Occupational History  . Not on file  Tobacco Use  . Smoking status: Current Every Day Smoker    Packs/day: 0.10    Types: Cigarettes  . Smokeless tobacco: Never Used  . Tobacco comment: 3 cigs daily  Substance and Sexual Activity   . Alcohol use: No    Comment: patient states he quit drinking since he has been sick  . Drug use: No    Comment: 2 weeks  . Sexual activity: Not on file

## 2018-03-03 ENCOUNTER — Other Ambulatory Visit: Payer: Self-pay | Admitting: Family Medicine

## 2018-03-15 ENCOUNTER — Encounter (INDEPENDENT_AMBULATORY_CARE_PROVIDER_SITE_OTHER): Payer: Self-pay | Admitting: Orthopaedic Surgery

## 2018-03-15 ENCOUNTER — Ambulatory Visit (INDEPENDENT_AMBULATORY_CARE_PROVIDER_SITE_OTHER): Payer: Medicaid Other | Admitting: Orthopaedic Surgery

## 2018-03-15 DIAGNOSIS — Z89422 Acquired absence of other left toe(s): Secondary | ICD-10-CM

## 2018-03-15 DIAGNOSIS — E1169 Type 2 diabetes mellitus with other specified complication: Secondary | ICD-10-CM

## 2018-03-15 DIAGNOSIS — M869 Osteomyelitis, unspecified: Secondary | ICD-10-CM

## 2018-03-15 MED ORDER — OXYCODONE-ACETAMINOPHEN 5-325 MG PO TABS: 1.0000 | ORAL_TABLET | Freq: Three times a day (TID) | ORAL | 0 refills | Status: DC | PRN

## 2018-03-15 NOTE — Progress Notes (Signed)
Patient is 3 weeks status post left trans-rotation.  He is overall doing well.  His incision has healed.  Sutures were removed today.  Percocet was refilled for pain that he takes 3 times a day.  Continue Bactroban ointment.  Continue heel weightbearing only.  Prescription to [facility] was provided today for stump shrinker.  Recheck in 6 weeks.

## 2018-03-16 ENCOUNTER — Inpatient Hospital Stay: Payer: Medicaid Other | Admitting: Family Medicine

## 2018-03-17 ENCOUNTER — Telehealth (INDEPENDENT_AMBULATORY_CARE_PROVIDER_SITE_OTHER): Payer: Self-pay | Admitting: Orthopaedic Surgery

## 2018-03-17 NOTE — Telephone Encounter (Signed)
Is there another Rx we can prescribe?

## 2018-03-17 NOTE — Telephone Encounter (Signed)
Patient called advised the Rx (Percocet) was not [place] and [employer] insurance company would not cover it. Patient said he had to pay the full price. ($30.00)  Patient said he took the Rx to [place] [facility] [place].  Pharmacy phone # is  [phone] The number to contact patient is [phone]

## 2018-03-17 NOTE — Telephone Encounter (Signed)
Oxycodone 5 mg TID prn pain #30

## 2018-03-17 NOTE — Telephone Encounter (Signed)
See message.

## 2018-03-17 NOTE — Telephone Encounter (Signed)
What do we need to do?

## 2018-03-18 ENCOUNTER — Other Ambulatory Visit (INDEPENDENT_AMBULATORY_CARE_PROVIDER_SITE_OTHER): Payer: Self-pay

## 2018-03-18 MED ORDER — OXYCODONE-ACETAMINOPHEN 5-325 MG PO TABS: 1.0000 | ORAL_TABLET | Freq: Three times a day (TID) | ORAL | 0 refills | Status: AC | PRN

## 2018-03-18 NOTE — Telephone Encounter (Signed)
Called patient he states all he needs is [place] [facility].  Confirmation #: [id number] W Prior Approval #: [id number]: [id number] Status: APPROVED    Patient aware.

## 2018-03-18 NOTE — Telephone Encounter (Signed)
Printed

## 2018-04-26 ENCOUNTER — Encounter (INDEPENDENT_AMBULATORY_CARE_PROVIDER_SITE_OTHER): Payer: Self-pay | Admitting: Orthopaedic Surgery

## 2018-04-26 ENCOUNTER — Ambulatory Visit (INDEPENDENT_AMBULATORY_CARE_PROVIDER_SITE_OTHER): Payer: Medicaid Other | Admitting: Orthopaedic Surgery

## 2018-04-26 DIAGNOSIS — E1169 Type 2 diabetes mellitus with other specified complication: Secondary | ICD-10-CM

## 2018-04-26 DIAGNOSIS — M869 Osteomyelitis, unspecified: Secondary | ICD-10-CM

## 2018-04-26 MED ORDER — DULOXETINE HCL 20 MG PO CPEP: 20.0000 mg | ORAL_CAPSULE | Freq: Every day | ORAL | 3 refills | Status: AC

## 2018-04-26 MED ORDER — TRAMADOL HCL 50 MG PO TABS: 50.0000 mg | ORAL_TABLET | Freq: Two times a day (BID) | ORAL | 2 refills | Status: AC | PRN

## 2018-04-26 MED FILL — DULoxetine HCL 20 MG CPEP: 20 | 30 days supply | Qty: 30 | Fill #0

## 2018-04-26 NOTE — Progress Notes (Signed)
Post-Op Visit Note   Patient: Ryan Shaw           Date of Birth: 05/09/1966           MRN: [id number] Visit Date: 04/26/2018 PCP: Ryan RegisterNewlin, Enobong, MD   Assessment & Plan:  Chief Complaint:  Chief Complaint  Patient presents with  . Left Foot - Pain   Visit Diagnoses:  1. Osteomyelitis due to type 2 diabetes mellitus [facility](HCC)     Plan: [name] is 2 months status post left transmetatarsal amputation.  He is complaining of pain.  Denies any constitutional symptoms.  His scar is fully healed.  He does have some dryness of the skin.  No evidence of infection.  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  I gave him a prescription for Cymbalta and tramadol.  Rest and ice and elevate as tolerated.  I recommend moisturization daily.  He is tried gabapentin and Lyrica in the past and this has not helped.  He is recently been evicted.  I would like him to follow-up with me after he receives his prosthesis.  Follow-Up Instructions: Return if symptoms worsen or fail to improve.   Orders:  No orders of the defined types were placed in this encounter.  Meds ordered this encounter  Medications  . traMADol (ULTRAM) 50 MG tablet    Sig: Take 1-2 tablets (50-100 mg total) by mouth every 12 (twelve) hours as needed.    Dispense:  30 tablet    Refill:  2  . DULoxetine (CYMBALTA) 20 MG capsule    Sig: Take 1 capsule (20 mg total) by mouth daily.    Dispense:  30 capsule    Refill:  3    Imaging: No results found.  PMFS History: Patient Active Problem List   Diagnosis Date Noted  . Hyponatremia 02/16/2018  . Chronic renal insufficiency 02/16/2018  . Diabetic foot infection (HCC) 02/16/2018  . Anxiety and depression 01/04/2017  . H/O amputation of lesser toe, left (HCC) 12/25/2016  . Hyperlipidemia 12/15/2016  . MRSA infection   . Abscess of bursa of right hip   . Cellulitis of left lower extremity   . Fever   . Right hip pain   . Acute right ankle  pain   . Idiopathic chronic gout of left ankle without tophus   . Amputee, great toe, left (HCC) 10/08/2016  . Diabetic foot ulcer (HCC) 10/08/2016  . 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 Acquired contracture of Achilles tendon, left   . Essential hypertension 10/02/2016  . Bilateral chronic knee pain 07/09/2015  . Uncontrolled type 2 diabetes mellitus with hyperosmolar nonketotic hyperglycemia (HCC) 11/01/2014  . Diabetic peripheral neuropathy (HCC) 10/25/2014   Past Medical History:  Diagnosis Date  . Arthritis   . CKD (chronic kidney disease)   . Diabetes mellitus without complication (HCC)   . Edema   . Hypercholesteremia   . Hypertension   . Neuropathy   . Ulcer of foot due to diabetes (HCC) 01/2018   left foot    Family History  Problem Relation Age of Onset  . Cancer Mother   . Aneurysm Father        brain    Past Surgical History:  Procedure Laterality Date  . AMPUTATION Right 02/17/2015   Procedure: SECOND RAY AMPUTATION;  Surgeon: Kathryne Hitchhristopher Y Blackman, MD;  [facility];  Service: Orthopedics;  Laterality: Right;  . AMPUTATION Left 10/04/2016  Procedure: GREAT TOE AMPUTATION GASTROCNEMIA RESECTION;  Surgeon: Nadara Mustard, MD;  Location: [facility] OR;  Service: Orthopedics;  Laterality: Left;  . AMPUTATION Left 12/16/2016   Procedure: LEFT FOOT 2ND TOE AMPUTATION;  Surgeon: Nadara Mustard, MD;  Location: [facility] OR;  Service: Orthopedics;  Laterality: Left;  . AMPUTATION Left 02/20/2018   Procedure: transmetatarsal amputation left foot;  Surgeon: Tarry Kos, MD;  Location: [facility] OR;  Service: Orthopedics;  Laterality: Left;  . HIP SURGERY Right   . INCISION AND DRAINAGE HIP Right 11/12/2016   Procedure: IRRIGATION AND DEBRIDEMENT RIGHT HIP;  Surgeon: Nadara Mustard, MD;  Location: [facility] OR;  Service: Orthopedics;  Laterality: Right;   Social History   Occupational History  . Not on file  Tobacco Use  . Smoking status: Current Every Day Smoker     Packs/day: 0.10    Types: Cigarettes  . Smokeless tobacco: Never Used  . Tobacco comment: 3 cigs daily  Substance and Sexual Activity  . Alcohol use: No    Comment: patient states he quit drinking since he has been sick  . Drug use: No    Comment: 2 weeks  . Sexual activity: Not on file
